# Patient Record
Sex: Male | Born: 1960
Health system: Southern US, Community
[De-identification: ages and names within clinical notes are randomized; demographics above are authoritative.]

## PROBLEM LIST (undated history)

## (undated) DIAGNOSIS — I639 Cerebral infarction, unspecified: Secondary | ICD-10-CM

## (undated) DIAGNOSIS — I7300 Raynaud's syndrome without gangrene: Secondary | ICD-10-CM

## (undated) DIAGNOSIS — R519 Headache, unspecified: Secondary | ICD-10-CM

## (undated) DIAGNOSIS — M109 Gout, unspecified: Secondary | ICD-10-CM

## (undated) DIAGNOSIS — M1990 Unspecified osteoarthritis, unspecified site: Secondary | ICD-10-CM

## (undated) DIAGNOSIS — K219 Gastro-esophageal reflux disease without esophagitis: Secondary | ICD-10-CM

## (undated) DIAGNOSIS — Z87442 Personal history of urinary calculi: Secondary | ICD-10-CM

## (undated) DIAGNOSIS — J449 Chronic obstructive pulmonary disease, unspecified: Secondary | ICD-10-CM

## (undated) DIAGNOSIS — R51 Headache: Secondary | ICD-10-CM

## (undated) HISTORY — DX: Raynaud's syndrome without gangrene: I73.00

## (undated) HISTORY — DX: Unspecified osteoarthritis, unspecified site: M19.90

---

## 2004-07-01 ENCOUNTER — Observation Stay (HOSPITAL_COMMUNITY): Admission: RE | Admit: 2004-07-01 | Discharge: 2004-07-02 | Payer: Self-pay | Admitting: General Surgery

## 2005-01-30 HISTORY — PX: HERNIA REPAIR: SHX51

## 2008-07-22 ENCOUNTER — Ambulatory Visit (HOSPITAL_COMMUNITY): Admission: RE | Admit: 2008-07-22 | Discharge: 2008-07-22 | Payer: Self-pay | Admitting: Family Medicine

## 2009-01-30 DIAGNOSIS — I639 Cerebral infarction, unspecified: Secondary | ICD-10-CM

## 2009-01-30 HISTORY — DX: Cerebral infarction, unspecified: I63.9

## 2010-06-17 NOTE — H&P (Signed)
Cory Roberts, Cory Roberts                  ACCOUNT NO.:  [id number]   MEDICAL RECORD NO.:  [id number]         PATIENT TYPE:  AMB   LOCATION:  DAY                           FACILITY:  [facility]   PHYSICIAN:  Jerolyn Shin C. Katrinka Blazing, M.D.   DATE OF BIRTH:  1960-11-15   DATE OF ADMISSION:  DATE OF DISCHARGE:  LH                                HISTORY & PHYSICAL   HISTORY OF PRESENT ILLNESS:  A 50 year old male with a long-term history of  swelling of the left inguinal area.  He was found to have a large  incarcerated left inguinal hernia and bilateral hydrocele.  He is scheduled  for left inguinal hernia repair and hydrocele on the left side.   PAST HISTORY:  He has no major illness.   MEDICATIONS:  He is not on any chronic medications.   ALLERGIES:  CODEINE.   PREVIOUS SURGERY:  He has no previous surgery.   PHYSICAL EXAMINATION:  VITAL SIGNS:  Blood pressure 126/72, pulse 76,  respirations 20, weight 169 pounds.  HEENT:  Unremarkable.  NECK:  Supple.  No JVD, bruit, adenopathy, or thyromegaly.  CHEST:  Clear to auscultation.  HEART:  Regular rate and rhythm without murmur, gallop, or rub.  ABDOMEN:  Soft, nontender.  No masses.  Large left inguinal hernia with  incarceration.  GENITALIA:  Bilateral hydrocele more prominent on the left than on the  right.  EXTREMITIES:  No clubbing, cyanosis, or edema.  NEUROLOGIC:  No focal motor, sensory, or cerebellar deficit.   IMPRESSION:  Left inguinal hernia repair with left hydrocele.   PLAN:  Left inguinal hernia repair and hydrocelectomy.       LCS/MEDQ  D:  06/30/2004  T:  06/30/2004  Job:  [id number]

## 2010-06-17 NOTE — Op Note (Signed)
[name], Cory Roberts                  ACCOUNT NO.:  [id number]   MEDICAL RECORD NO.:  [id number]          PATIENT TYPE:  OBV   LOCATION:  A215                          FACILITY:  [facility]   PHYSICIAN:  Dirk Dress. Katrinka Blazing, M.D.   DATE OF BIRTH:  1960-08-01   DATE OF PROCEDURE:  07/01/2004  DATE OF DISCHARGE:                                 OPERATIVE REPORT   PREOPERATIVE DIAGNOSIS:  Left inguinal hernia and hydrocele.   POSTOPERATIVE DIAGNOSIS:  Left inguinal hernia and hydrocele.   PROCEDURES:  Left inguinal hernia repair and hydrocelectomy.   SURGEON:  Dr. Katrinka Blazing.   DESCRIPTION:  Under general anesthesia, the patient's left lower quadrant  and genitalia were prepped and draped in sterile field. A curvilinear lower  inguinal incision was made and extended through the subcutaneous tissue.  Aponeurosis was opened in the line of its fibers. Cord was mobilized, and a  large hernia sac was identified. Hernia sac was dissected from the cord back  to the internal ring. It was invaginated. Two large mesh plugs were placed  and were sutured with interrupted 0 Prolene. An [place] patch was used. It was  used to do a primary repair because the defect was so large. It was sutured  to the conjoined tendon and to Cooper's ligament and iliopubic tract using 0  Prolene. The testicle was mobilized into the operative field. It had a very  large hydrocele. Hydrocele was incised, and fluid was suctioned. Excess sac  was excised, and then the edges were reapproximated posteriorly to the  testicle using 2-0 Monocryl. The testicle was repositioned back in the  scrotum at the gubernaculum. The aponeurosis was closed over the cord using  3-0 Biosyn. Subcutaneous tissue was closed with 3-0 Biosyn. Skin was closed  with subcuticular 4-0 Dexon. Local infiltration was carried out with 0.5%  Marcaine with epinephrine. The patient tolerated procedure well. He was  awakened from anesthesia and transferred to a bed and taken to  the  postanesthetic care unit for further monitoring.       LCS/MEDQ  D:  07/01/2004  T:  07/01/2004  Job:  [id number]   cc:   Madelin Rear. Sherwood Gambler, MD  [address]  [place]  [place] [address]  Fax: [phone]

## 2011-03-03 DIAGNOSIS — M1990 Unspecified osteoarthritis, unspecified site: Secondary | ICD-10-CM

## 2011-03-03 HISTORY — DX: Unspecified osteoarthritis, unspecified site: M19.90

## 2011-04-24 ENCOUNTER — Encounter: Payer: Self-pay | Admitting: Surgery

## 2011-05-08 ENCOUNTER — Encounter: Payer: Self-pay | Admitting: Surgery

## 2011-05-11 ENCOUNTER — Other Ambulatory Visit: Payer: Self-pay

## 2011-05-11 DIAGNOSIS — I7300 Raynaud's syndrome without gangrene: Secondary | ICD-10-CM

## 2011-05-12 ENCOUNTER — Encounter: Payer: Self-pay | Admitting: Surgery

## 2011-05-15 ENCOUNTER — Encounter (INDEPENDENT_AMBULATORY_CARE_PROVIDER_SITE_OTHER): Payer: BC Managed Care – PPO | Admitting: *Deleted

## 2011-05-15 ENCOUNTER — Other Ambulatory Visit: Payer: Self-pay | Admitting: Surgery

## 2011-05-15 ENCOUNTER — Ambulatory Visit (INDEPENDENT_AMBULATORY_CARE_PROVIDER_SITE_OTHER): Payer: BC Managed Care – PPO | Admitting: Thoracic Diseases

## 2011-05-15 ENCOUNTER — Encounter: Payer: Self-pay | Admitting: Surgery

## 2011-05-15 VITALS — BP 128/83 | HR 64 | Resp 16 | Ht 70.0 in | Wt 190.8 lb

## 2011-05-15 DIAGNOSIS — I749 Embolism and thrombosis of unspecified artery: Secondary | ICD-10-CM

## 2011-05-15 DIAGNOSIS — I779 Disorder of arteries and arterioles, unspecified: Secondary | ICD-10-CM

## 2011-05-15 DIAGNOSIS — M79609 Pain in unspecified limb: Secondary | ICD-10-CM

## 2011-05-15 MED ORDER — ASPIRIN 325 MG PO TABS: 325.0000 mg | ORAL_TABLET | Freq: Every day | ORAL | Status: DC

## 2011-05-15 NOTE — Progress Notes (Signed)
[facility] HISTORY AND PHYSICAL   CC: cyanosis and pain right 3rd and fifth fingers Referring Physician: Larrie Kass, PA PMD: Elfredia Nevins, MD  History of Present Illness: Cory Roberts is a 51 y.o. male - left handed- Who had new onset of gout in right great toe approx 3 months ago. He was started on Prednisone for this. One week into treatment the patient developed sudden onset of pain, numbness, coldness and cyanosis in the 3rd and 5th fingers of the right hand. The numbness and pain spread to the arm up to the shoulder and was described as sharp and constant throbbing. He was seen 3-4 weeks into treatment and the prednisone was stopped. He was placed on procardia with some resolution of symptoms. He also had swelling in the tips of the fingers to the extent that they blistered and healed. He denies symptoms in his left hand. He states the arm and hand will still go numb for about 1/2 the day and then the symptoms resolve.  Pt states he was worked d-up 4 years ago for possible TIA/Stroke when he developed "squeezing " pain in the back of the neck followed by "seeing spots' and then passing out. He had MRI at the time which he says was negative and did not go for further F/U.  He states he continues to have these symptoms 1-2 times per week but can feel this coming on so he sits and let's the symptoms pass. Pt. denies symptoms of amaurosis fugax; denies monocular blindness. Pt. has normal speech He denies weakness in LUE,LLE or RLE He  Denies irregular Heart beat or rapid heartbeat.  Past Surgical History  Procedure Date  . Hernia repair 2007    Past Medical History  Diagnosis Date  . Raynaud's syndrome   . Arthritis Feb. 2013    Gout- Right foot    ROS: [x]  Positive   [ ]  Denies    General: [ ]  Weight loss, [ ]  Fever, [ ]  chills Neurologic: [ x] Dizziness, [x ] Blackouts, [ ]  Seizure [ ]  Stroke, [ ]  "Mini stroke", [ ]  Slurred speech, [ ]   Temporary blindness; [x ] weakness in arms or legs, [ ]  Hoarseness Cardiac: [ ]  Chest pain/pressure, [ ]  Shortness of breath at rest [ ]  Shortness of breath with exertion, [ ]  Atrial fibrillation or irregular heartbeat Vascular: [ ]  Pain in legs with walking, [ ]  Pain in legs at rest, [ ]  Pain in legs at night,  [ ]  Non-healing ulcer, [ ]  Blood clot in vein/DVT,   Pulmonary: [ ]  Home oxygen, [ ]  Productive cough, [ ]  Coughing up blood, [ ]  Asthma,  [ ]  Wheezing Musculoskeletal:  [ ]  Arthritis, [ ]  Low back pain, [ ]  Joint pain Hematologic: [ ]  Easy Bruising, [ ]  Anemia; [ ]  Hepatitis Gastrointestinal: [ ]  Blood in stool, [ ]  Gastroesophageal Reflux/heartburn, [ ]  Trouble swallowing Urinary: [ ]  chronic Kidney disease, [ ]  on HD - [ ]  MWF or [ ]  TTHS, [ ]  Burning with urination, [ ]  Difficulty urinating Skin: [ ]  Rashes, [ ]  Wounds Psychological: [ ]  Anxiety, [ ]  Depression   Social History History  Substance Use Topics  . Smoking status: Former Smoker    Types: Cigarettes    Quit date: 01/30/2005  . Smokeless tobacco: Not on file  . Alcohol Use: No    Family History Family History  Problem Relation Age of Onset  . Cancer Mother   .  Hyperlipidemia Mother   . Hypertension Mother   . Cancer Father   . Heart disease Father 51    Heart Disease before age 23  . Heart attack Father   . Diabetes Sister   . Cancer Sister   . Hyperlipidemia Sister   . Hypertension Sister     Allergies  Allergen Reactions  . Codeine Nausea And Vomiting    Current Outpatient Prescriptions  Medication Sig Dispense Refill  . NIFEdipine (PROCARDIA) 10 MG capsule Take 10 mg by mouth 3 (three) times daily.      Marland Kitchen aspirin (BAYER ASPIRIN) 325 MG tablet Take 1 tablet (325 mg total) by mouth daily.  100 tablet  3    Physical Examination  Filed Vitals:   05/15/11 1302  BP: 128/83  Pulse: 64  Resp: 16    Body mass index is 27.38 kg/(m^2).  General:  WDWN in NAD Gait: Normal HENT: WNL Eyes:  Pupils equal Pulmonary: normal non-labored breathing , without Rales, rhonchi,  wheezing Cardiac: RRR, without  Murmurs, rubs or gallops; No carotid bruits noted Abdomen: soft, NT, no masses Skin: no rashes, ulcers noted Vascular Exam/Pulses:  2+ palpable Radial, femoral DP and PT pulses bilaterally 1+ ulnar bilaterally Extremities without ischemic changes, no Gangrene , no cellulitis; no open wounds;  Musculoskeletal: no muscle wasting or atrophy  Neurologic: A&O X 3; Appropriate Affect ; SENSATION: normal in BUE/BLE MOTOR FUNCTION:  moving all extremities equally with 5/5 strength. Speech is fluent/normal  Non-Invasive Vascular Imaging: 05/15/2011 [facility]BI Normal study with triphasic flow bilaterally except biphasic flow in the right ulnar artery Digit waveforms bilat- WNL  ASSESSMENT: Possible embolic event to right 3rd and 5th fingers, source unknown. Will need to R/O cardiac,arterial and aneurysmal  Source. R/O left brain stroke/ carotid disease - less likely source of symptoms PLAN: CTA of chest with right arm runoff to R/o obstructive or aneurysmal source of emboli 2D ECHO - R/O cardiac source of emboli Carotid duplex F/U next week with Dr. Myra Gianotti  Clinic MD: Vivi Martens, MD

## 2011-05-16 ENCOUNTER — Ambulatory Visit (HOSPITAL_COMMUNITY): Payer: BC Managed Care – PPO

## 2011-05-17 ENCOUNTER — Other Ambulatory Visit: Payer: BC Managed Care – PPO

## 2011-05-22 ENCOUNTER — Other Ambulatory Visit: Payer: BC Managed Care – PPO

## 2011-05-22 ENCOUNTER — Ambulatory Visit: Payer: BC Managed Care – PPO | Admitting: Surgery

## 2011-10-28 ENCOUNTER — Other Ambulatory Visit: Payer: Self-pay

## 2011-10-28 ENCOUNTER — Encounter (HOSPITAL_COMMUNITY): Payer: Self-pay | Admitting: *Deleted

## 2011-10-28 ENCOUNTER — Emergency Department (HOSPITAL_COMMUNITY): Payer: BC Managed Care – PPO

## 2011-10-28 ENCOUNTER — Inpatient Hospital Stay (HOSPITAL_COMMUNITY)
Admission: EM | Admit: 2011-10-28 | Discharge: 2011-10-31 | DRG: 014 | Disposition: A | Discharge status: Home / Self Care | Payer: BC Managed Care – PPO | Attending: Internal Medicine | Admitting: Internal Medicine

## 2011-10-28 DIAGNOSIS — G43909 Migraine, unspecified, not intractable, without status migrainosus: Secondary | ICD-10-CM | POA: Diagnosis present

## 2011-10-28 DIAGNOSIS — E669 Obesity, unspecified: Secondary | ICD-10-CM | POA: Diagnosis present

## 2011-10-28 DIAGNOSIS — M109 Gout, unspecified: Secondary | ICD-10-CM | POA: Diagnosis present

## 2011-10-28 DIAGNOSIS — Z6827 Body mass index (BMI) 27.0-27.9, adult: Secondary | ICD-10-CM

## 2011-10-28 DIAGNOSIS — R209 Unspecified disturbances of skin sensation: Secondary | ICD-10-CM | POA: Diagnosis present

## 2011-10-28 DIAGNOSIS — E785 Hyperlipidemia, unspecified: Secondary | ICD-10-CM

## 2011-10-28 DIAGNOSIS — M79609 Pain in unspecified limb: Secondary | ICD-10-CM

## 2011-10-28 DIAGNOSIS — I639 Cerebral infarction, unspecified: Secondary | ICD-10-CM

## 2011-10-28 DIAGNOSIS — Z8673 Personal history of transient ischemic attack (TIA), and cerebral infarction without residual deficits: Secondary | ICD-10-CM | POA: Diagnosis present

## 2011-10-28 DIAGNOSIS — R42 Dizziness and giddiness: Secondary | ICD-10-CM | POA: Diagnosis present

## 2011-10-28 DIAGNOSIS — I6350 Cerebral infarction due to unspecified occlusion or stenosis of unspecified cerebral artery: Principal | ICD-10-CM

## 2011-10-28 DIAGNOSIS — Z87891 Personal history of nicotine dependence: Secondary | ICD-10-CM

## 2011-10-28 DIAGNOSIS — Z9189 Other specified personal risk factors, not elsewhere classified: Secondary | ICD-10-CM

## 2011-10-28 DIAGNOSIS — M129 Arthropathy, unspecified: Secondary | ICD-10-CM | POA: Diagnosis present

## 2011-10-28 DIAGNOSIS — G43109 Migraine with aura, not intractable, without status migrainosus: Secondary | ICD-10-CM

## 2011-10-28 DIAGNOSIS — E7800 Pure hypercholesterolemia, unspecified: Secondary | ICD-10-CM | POA: Diagnosis present

## 2011-10-28 DIAGNOSIS — I7300 Raynaud's syndrome without gangrene: Secondary | ICD-10-CM | POA: Diagnosis present

## 2011-10-28 HISTORY — DX: Gout, unspecified: M10.9

## 2011-10-28 LAB — POCT I-STAT, CHEM 8
BUN: 16 mg/dL (ref 6–23)
Calcium, Ion: 1.16 mmol/L (ref 1.12–1.23)
Chloride: 105 mEq/L (ref 96–112)
Potassium: 3.5 mEq/L (ref 3.5–5.1)

## 2011-10-28 LAB — CBC WITH DIFFERENTIAL/PLATELET
Eosinophils Relative: 0 % (ref 0–5)
HCT: 41.5 % (ref 39.0–52.0)
Hemoglobin: 14.7 g/dL (ref 13.0–17.0)
Lymphocytes Relative: 37 % (ref 12–46)
Lymphs Abs: 2.5 10*3/uL (ref 0.7–4.0)
MCV: 92 fL (ref 78.0–100.0)
Monocytes Absolute: 0.5 10*3/uL (ref 0.1–1.0)
Monocytes Relative: 8 % (ref 3–12)
Neutro Abs: 3.8 10*3/uL (ref 1.7–7.7)
RBC: 4.51 MIL/uL (ref 4.22–5.81)
RDW: 12.7 % (ref 11.5–15.5)
WBC: 6.9 10*3/uL (ref 4.0–10.5)

## 2011-10-28 MED ORDER — MECLIZINE HCL 12.5 MG PO TABS
25.0000 mg | ORAL_TABLET | Freq: Once | ORAL | Status: AC
  Administered 2011-10-28: 25 mg via ORAL
  Filled 2011-10-28: qty 2

## 2011-10-28 NOTE — H&P (Addendum)
PCP:   Cassell Smiles., MD not local   Chief Complaint:   Vertigo and ataxia  HPI: Cory Roberts is a 51 y.o. male   has a past medical history of Raynaud's syndrome; Arthritis (Feb. 2013); and Gout.   Presented with  Since 10 am he started to feel like the room is spinning and he felt nausea. He presented to [facility] and was given a meclozine with some improvement. He was transferred for further work up to [facility] for further work up. An MRI done showed small brain stem infarct. Neurology is aware and have seen the patient. Currently his symptoms have resolved bu 8:30 pm. At the time he had trouble standing up due to ataxia.   Review of Systems:   Pertinent positives include:headaches, gait abnormality,   Constitutional:  No weight loss, night sweats, Fevers, chills, fatigue, weight loss  HEENT:  No , Difficulty swallowing,Tooth/dental problems,Sore throat,  No sneezing, itching, ear ache, nasal congestion, post nasal drip,  Cardio-vascular:  No chest pain, Orthopnea, PND, anasarca, dizziness, palpitations.no Bilateral lower extremity swelling  GI:  No heartburn, indigestion, abdominal pain, nausea, vomiting, diarrhea, change in bowel habits, loss of appetite, melena, blood in stool, hematemesis Resp:  no shortness of breath at rest. No dyspnea on exertion, No excess mucus, no productive cough, No non-productive cough, No coughing up of blood.No change in color of mucus.No wheezing. Skin:  no rash or lesions. No jaundice GU:  no dysuria, change in color of urine, no urgency or frequency. No straining to urinate.  No flank pain.  Musculoskeletal:  No joint pain or no joint swelling. No decreased range of motion. No back pain.  Psych:  No change in mood or affect. No depression or anxiety. No memory loss.  Neuro: no localizing neurological complaints, no tingling, no weakness, no double vision, nono slurred speech, no confusion  Otherwise ROS are negative except for above, 10  systems were reviewed  Past Medical History: Past Medical History  Diagnosis Date  . Raynaud's syndrome   . Arthritis Feb. 2013    Gout- Right foot  . Gout    Past Surgical History  Procedure Date  . Hernia repair 2007     Medications: Prior to Admission medications   Not on File    Allergies:   Allergies  Allergen Reactions  . Codeine Nausea And Vomiting  . Hydrocodone Nausea Only    Social History:  Ambulatory  independently  Lives at home with wife.   reports that he quit smoking about 6 years ago. His smoking use included Cigarettes. He does not have any smokeless tobacco history on file. He reports that he does not drink alcohol or use illicit drugs.   Family History: family history includes Cancer in his father, mother, and sister; Diabetes in his sister; Heart attack in his father; Heart disease (age of onset:48) in his father; Hyperlipidemia in his mother and sister; and Hypertension in his mother and sister.    Physical Exam: Patient Vitals for the past 24 hrs:  BP Temp Temp src Pulse Resp SpO2 Height Weight  10/28/11 1818 120/71 mmHg 97.9 F (36.6 C) Oral 61  19  98 % - -  10/28/11 1707 123/75 mmHg 97.7 F (36.5 C) - 52  17  100 % - -  10/28/11 1451 - 97.7 F (36.5 C) - - - - - -  10/28/11 1428 164/97 mmHg - - 78  - - - -  10/28/11 1417 133/78 mmHg - -  64  20  98 % - -  10/28/11 1416 136/83 mmHg 97.4 F (36.3 C) Oral 79  23  99 % 5\' 10"  (1.778 m) 86.183 kg (190 lb)    1. General:  in No Acute distress 2. Psychological: Alert and  Oriented 3. Head/ENT:   Moist  Mucous Membranes                          Head Non traumatic, neck supple                          Normal  Dentition 4. SKIN: normal Skin turgor,  Skin clean Dry and intact no rash 5. Heart: Regular rate and rhythm no Murmur, Rub or gallop 6. Lungs: Clear to auscultation bilaterally, no wheezes or crackles   7. Abdomen: Soft, non-tender, Non distended 8. Lower extremities: no clubbing,  cyanosis, or edema 9. Neurologically strength 5/5 in all 4 ext. CN 2-12 intact, he does have vertical nystagmus present 10. MSK: Normal range of motion  body mass index is 27.26 kg/(m^2).   Labs on Admission:   [facility] 10/28/11 1514  NA 143  K 3.5  CL 105  CO2 --  GLUCOSE 109*  BUN 16  CREATININE 1.00  CALCIUM --  MG --  PHOS --   No results found for this basename: AST:2,ALT:2,ALKPHOS:2,BILITOT:2,PROT:2,ALBUMIN:2 in the last 72 hours No results found for this basename: LIPASE:2,AMYLASE:2 in the last 72 hours  Basename 10/28/11 2009 10/28/11 1514  WBC 6.9 --  NEUTROABS 3.8 --  HGB 14.7 15.0  HCT 41.5 44.0  MCV 92.0 --  PLT 200 --   No results found for this basename: CKTOTAL:3,CKMB:3,CKMBINDEX:3,TROPONINI:3 in the last 72 hours No results found for this basename: TSH,T4TOTAL,FREET3,T3FREE,THYROIDAB in the last 72 hours No results found for this basename: VITAMINB12:2,FOLATE:2,FERRITIN:2,TIBC:2,IRON:2,RETICCTPCT:2 in the last 72 hours No results found for this basename: HGBA1C    Estimated Creatinine Clearance: 88.2 ml/min (by C-G formula based on Cr of 1). ABG    Component Value Date/Time   TCO2 23 10/28/2011 1514     Other results:  I have pearsonaly reviewed this: ECG REPORT  Rate: 54  Rhythm: NSR ST&T Change: no ischemic changes   Cultures: No results found for this basename: sdes, specrequest, cult, reptstatus       Radiological Exams on Admission: Ct Head Wo Contrast  10/28/2011  *RADIOLOGY REPORT*  Clinical Data: Dizziness, nausea and vomiting.  CT HEAD WITHOUT CONTRAST  Technique:  Contiguous axial images were obtained from the base of the skull through the vertex without contrast.  Comparison: Brain MRI 07/22/2008.  Findings: The brain appears normal with evidence of infarct, hemorrhage, mass lesion, mass effect, midline shift or abnormal extra-axial fluid collection.  No hydrocephalus or pneumocephalus. Calvarium intact.  IMPRESSION: Negative  exam.   Original Report Authenticated By: Bernadene Bell. Maricela Curet, M.D.    Mr Brain Wo Contrast  10/28/2011  *RADIOLOGY REPORT*  Clinical Data: Acute onset of vertigo earlier today.  Still slightly dizzy. History of Raynaud's syndrome.  History of gout.  MRI HEAD WITHOUT CONTRAST  Technique:  Multiplanar, multiecho pulse sequences of the brain and surrounding structures were obtained according to standard protocol without intravenous contrast.  Comparison: CT head earlier in the day.  Findings: There is a 3-4 mm area of restricted diffusion centrally within the medulla (image 9 series 10) without other brainstem or cerebellar areas of restricted diffusion.  This  is seen only on thin section DWI specifically through the posterior fossa (series 10); routine DWI series is inconclusive. Findings are consistent with an acute small vessel brainstem infarct.  Widely patent intracranial vasculature, specifically the vertebral and basilar arteries appear widely patent.  The left vertebral is dominant.  There is no evidence for dissection based on axial routine MR imaging.  There is no bleed, mass lesion, hydrocephalus, or extra-axial fluid.  There is no atrophy. There is no significant white matter disease.  Normal pituitary and cerebellar tonsils.  No foci of chronic hemorrhage or large vessel infarct. Negative pituitary, cerebellar tonsils, and orbits.  Unremarkable upper cervical region.  No worrisome skull base or osseous lesions.  No acute sinus or mastoid fluid. Incidental shotty cervical lymphadenopathy.  IMPRESSION: Small area of restricted diffusion centrally within the medulla concerning for acute brainstem infarct. No lobar hemorrhage or bleed observed.  Otherwise negative study. Findings discussed with ordering provider.   Original Report Authenticated By: Elsie Stain, M.D.     Chart has been reviewed  Assessment/Plan  51 yo M here with acute brainstem CVA currently symptomatically much improved.    Present on Admission:  .CVA (cerebral infarction)  - will admit based on TIA/CVA protocol, await results of Carotid Doppler and Echo, obtain cardiac enzymes,  ECG,  Lipid panel, TSH. Order PT/OT evaluation. Will make sure patient is on antiplatelet agent.  Neurology have been consulted.     Given hx of Reynolds syndrome will have basic rheumatologic work up. And make sure he has an MRA ordered as well.  .Vertigo - as a result of CVA currently much improved.    Prophylaxis: SCD   CODE STATUS: FULL CODE  Other plan as per orders.  I have spent a total of 55 min on this admission  Aydin Hink 10/28/2011, 9:50 PM

## 2011-10-28 NOTE — ED Provider Notes (Signed)
6:16 PM Patient has arrived to CDU holding for MRI brain.  Pt sent from Dr Rosalia Hammers at [facility].  Pt with vertiginous symptoms and ataxic gait, symptoms improved in ED.  Plan is for MRI brain.  If negative, pt to be d/c home.    7:38 PM Pt reports he is completely asymptomatic, back to baseline.  Denies any dizziness.  Pt has just returned from MRI.    7:54 PM Received call from radiologist.  Pt has had acute brain stem infarct.  I have advised Dr Bernette Mayers of the results.  I have also ordered a CBC and plan for admission.    8:04 PM I have spoken with Dr Thad Ranger of neurology and made her aware of patient with stroke.  Plan for admission to medicine. I have discussed the findings and plan with the patient - he verbalizes understanding and agrees with plan.    8:59 PM Pt admitted to [facility] hospitalist for acute stroke.  To be seen in CDU by hospitalist.  No bed request or holding orders needed from me.    Results for orders placed during the hospital encounter of 10/28/11  POCT I-STAT, CHEM 8      Component Value Range   Sodium 143  135 - 145 mEq/L   Potassium 3.5  3.5 - 5.1 mEq/L   Chloride 105  96 - 112 mEq/L   BUN 16  6 - 23 mg/dL   Creatinine, Ser 5.62  0.50 - 1.35 mg/dL   Glucose, Bld 130 (*) 70 - 99 mg/dL   Calcium, Ion 8.65  7.84 - 1.23 mmol/L   TCO2 23  0 - 100 mmol/L   Hemoglobin 15.0  13.0 - 17.0 g/dL   HCT 69.6  29.5 - 28.4 %  CBC WITH DIFFERENTIAL      Component Value Range   WBC 6.9  4.0 - 10.5 K/uL   RBC 4.51  4.22 - 5.81 MIL/uL   Hemoglobin 14.7  13.0 - 17.0 g/dL   HCT 13.2  44.0 - 10.2 %   MCV 92.0  78.0 - 100.0 fL   MCH 32.6  26.0 - 34.0 pg   MCHC 35.4  30.0 - 36.0 g/dL   RDW 72.5  36.6 - 44.0 %   Platelets 200  150 - 400 K/uL   Neutrophils Relative 55  43 - 77 %   Neutro Abs 3.8  1.7 - 7.7 K/uL   Lymphocytes Relative 37  12 - 46 %   Lymphs Abs 2.5  0.7 - 4.0 K/uL   Monocytes Relative 8  3 - 12 %   Monocytes Absolute 0.5  0.1 - 1.0 K/uL   Eosinophils Relative 0  0 -  5 %   Eosinophils Absolute 0.0  0.0 - 0.7 K/uL   Basophils Relative 0  0 - 1 %   Basophils Absolute 0.0  0.0 - 0.1 K/uL   Ct Head Wo Contrast  10/28/2011  *RADIOLOGY REPORT*  Clinical Data: Dizziness, nausea and vomiting.  CT HEAD WITHOUT CONTRAST  Technique:  Contiguous axial images were obtained from the base of the skull through the vertex without contrast.  Comparison: Brain MRI 07/22/2008.  Findings: The brain appears normal with evidence of infarct, hemorrhage, mass lesion, mass effect, midline shift or abnormal extra-axial fluid collection.  No hydrocephalus or pneumocephalus. Calvarium intact.  IMPRESSION: Negative exam.   Original Report Authenticated By: Bernadene Bell. Maricela Curet, M.D.    Mr Brain Wo Contrast  10/28/2011  *RADIOLOGY REPORT*  Clinical Data: Acute onset of vertigo earlier today.  Still slightly dizzy. History of Raynaud's syndrome.  History of gout.  MRI HEAD WITHOUT CONTRAST  Technique:  Multiplanar, multiecho pulse sequences of the brain and surrounding structures were obtained according to standard protocol without intravenous contrast.  Comparison: CT head earlier in the day.  Findings: There is a 3-4 mm area of restricted diffusion centrally within the medulla (image 9 series 10) without other brainstem or cerebellar areas of restricted diffusion.  This is seen only on thin section DWI specifically through the posterior fossa (series 10); routine DWI series is inconclusive. Findings are consistent with an acute small vessel brainstem infarct.  Widely patent intracranial vasculature, specifically the vertebral and basilar arteries appear widely patent.  The left vertebral is dominant.  There is no evidence for dissection based on axial routine MR imaging.  There is no bleed, mass lesion, hydrocephalus, or extra-axial fluid.  There is no atrophy. There is no significant white matter disease.  Normal pituitary and cerebellar tonsils.  No foci of chronic hemorrhage or large vessel  infarct. Negative pituitary, cerebellar tonsils, and orbits.  Unremarkable upper cervical region.  No worrisome skull base or osseous lesions.  No acute sinus or mastoid fluid. Incidental shotty cervical lymphadenopathy.  IMPRESSION: Small area of restricted diffusion centrally within the medulla concerning for acute brainstem infarct. No lobar hemorrhage or bleed observed.  Otherwise negative study. Findings discussed with ordering provider.   Original Report Authenticated By: Elsie Stain, M.D.           [place], [place] 10/28/11 2102

## 2011-10-28 NOTE — ED Provider Notes (Signed)
Medical screening examination/treatment/procedure(s) were performed by non-physician practitioner and as supervising physician I was immediately available for consultation/collaboration.  Cayleen Benjamin B. Lacole Komorowski, MD 10/28/11 2256 

## 2011-10-28 NOTE — ED Notes (Signed)
Pt taken to CT.

## 2011-10-28 NOTE — ED Notes (Signed)
Pt. Arrived via [facility], pt. Immediately transferred to MRI

## 2011-10-28 NOTE — ED Notes (Signed)
Patient had sudden onset of dizziness, nausea and vomiting this morning at 1000.

## 2011-10-28 NOTE — Consult Note (Signed)
Referring Physician: Ray    Chief Complaint: Dizziness and difficulty with gait  HPI: Cory Roberts is an 51 y.o. male who was sitting on the couch today and had acute onset of dizziness.  Also had some nausea and vomiting.  When he attempted to get up he was unable to walk.  [facility] presented to [facility] and CT there was unremarkable.  Patient had marked improvement in dizziness with Meclizine and was sent to [facility] for further work up.  MRI of the brain performed here shows an acute medullary infarct.    LSN: [id number] tPA Given: No: Outside time window  Past Medical History  Diagnosis Date  . Raynaud's syndrome   . Arthritis Feb. 2013    Gout- Right foot  . Gout     -  Hypertension  Past Surgical History  Procedure Date  . Hernia repair 2007    Family History  Problem Relation Age of Onset  . Cancer Mother   . Hyperlipidemia Mother   . Hypertension Mother   . Cancer Father   . Heart disease Father 74    Heart Disease before age 61  . Heart attack Father   . Diabetes Sister   . Cancer Sister   . Hyperlipidemia Sister   . Hypertension Sister    Social History:  reports that he quit smoking about 6 years ago. His smoking use included Cigarettes. He does not have any smokeless tobacco history on file. He reports that he does not drink alcohol or use illicit drugs.  Allergies:  Allergies  Allergen Reactions  . Codeine Nausea And Vomiting  . Hydrocodone Nausea Only    Medications: I have reviewed the patient's current medications. Prior to Admission:  None  ROS: History obtained from the patient  General ROS: negative for - chills, fatigue, fever, night sweats, weight gain or weight loss Psychological ROS: negative for - behavioral disorder, hallucinations, memory difficulties, mood swings or suicidal ideation Ophthalmic ROS: negative for - blurry vision, double vision, eye pain or loss of vision ENT ROS: negative for - epistaxis, nasal discharge, oral lesions, sore throat,  tinnitus or vertigo Allergy and Immunology ROS: negative for - hives or itchy/watery eyes Hematological and Lymphatic ROS: negative for - bleeding problems, bruising or swollen lymph nodes Endocrine ROS: negative for - galactorrhea, hair pattern changes, polydipsia/polyuria or temperature intolerance Respiratory ROS: negative for - cough, hemoptysis, shortness of breath or wheezing Cardiovascular ROS: negative for - chest pain, dyspnea on exertion, edema or irregular heartbeat Gastrointestinal ROS: negative for - abdominal pain, diarrhea, hematemesis, nausea/vomiting or stool incontinence Genito-Urinary ROS: negative for - dysuria, hematuria, incontinence or urinary frequency/urgency Musculoskeletal ROS: fingers become blue and numb on the tips Neurological ROS: as noted in HPI Dermatological ROS: negative for rash and skin lesion changes  Physical Examination: Blood pressure 120/71, pulse 61, temperature 97.9 F (36.6 C), temperature source Oral, resp. rate 19, height 5\' 10"  (1.778 m), weight 86.183 kg (190 lb), SpO2 98.00%.  Neurologic Examination: Mental Status: Alert, oriented, thought content appropriate.  Speech fluent without evidence of aphasia.  Able to follow 3 step commands without difficulty. Cranial Nerves: II: Discs flat bilaterally; Visual fields grossly normal, pupils equal, round, reactive to light and accommodation III,IV, VI: ptosis not present, extra-ocular motions intact bilaterally V,VII: smile symmetric, facial light touch sensation normal bilaterally VIII: hearing normal bilaterally IX,X: gag reflex present XI: bilateral shoulder shrug XII: midline tongue extension Motor: Right : Upper extremity   5/5    Left:  Upper extremity   5/5  Lower extremity   5/5     Lower extremity   5/5 Tone and bulk:normal tone throughout; no atrophy noted Sensory: Pinprick and light touch intact throughout, bilaterally Deep Tendon Reflexes: 2+ and symmetric with absent AJ's  bilaterally Plantars: Right: mute   Left: mute Cerebellar: normal finger-to-nose and normal heel-to-shin test.  In seated position leans to the right. Gait: not tested CV: pulses palpable throughout   Laboratory Studies:  Basic Metabolic Panel:  Lab 10/28/11 4098  NA 143  K 3.5  CL 105  CO2 --  GLUCOSE 109*  BUN 16  CREATININE 1.00  CALCIUM --  MG --  PHOS --    Liver Function Tests: No results found for this basename: AST:5,ALT:5,ALKPHOS:5,BILITOT:5,PROT:5,ALBUMIN:5 in the last 168 hours No results found for this basename: LIPASE:5,AMYLASE:5 in the last 168 hours No results found for this basename: AMMONIA:3 in the last 168 hours  CBC:  Lab 10/28/11 2009 10/28/11 1514  WBC 6.9 --  NEUTROABS 3.8 --  HGB 14.7 15.0  HCT 41.5 44.0  MCV 92.0 --  PLT 200 --    Cardiac Enzymes: No results found for this basename: CKTOTAL:5,CKMB:5,CKMBINDEX:5,TROPONINI:5 in the last 168 hours  BNP: No components found with this basename: POCBNP:5  CBG: No results found for this basename: GLUCAP:5 in the last 168 hours  Microbiology: No results found for this or any previous visit.  Coagulation Studies: No results found for this basename: LABPROT:5,INR:5 in the last 72 hours  Urinalysis: No results found for this basename: COLORURINE:2,APPERANCEUR:2,LABSPEC:2,PHURINE:2,GLUCOSEU:2,HGBUR:2,BILIRUBINUR:2,KETONESUR:2,PROTEINUR:2,UROBILINOGEN:2,NITRITE:2,LEUKOCYTESUR:2 in the last 168 hours  Lipid Panel: No results found for this basename: chol, trig, hdl, cholhdl, vldl, ldlcalc    HgbA1C:  No results found for this basename: HGBA1C    Urine Drug Screen:   No results found for this basename: labopia, cocainscrnur, labbenz, amphetmu, thcu, labbarb    Alcohol Level: No results found for this basename: ETH:2 in the last 168 hours.  Imaging: Ct Head Wo Contrast  10/28/2011  *RADIOLOGY REPORT*  Clinical Data: Dizziness, nausea and vomiting.  CT HEAD WITHOUT CONTRAST  Technique:   Contiguous axial images were obtained from the base of the skull through the vertex without contrast.  Comparison: Brain MRI 07/22/2008.  Findings: The brain appears normal with evidence of infarct, hemorrhage, mass lesion, mass effect, midline shift or abnormal extra-axial fluid collection.  No hydrocephalus or pneumocephalus. Calvarium intact.  IMPRESSION: Negative exam.   Original Report Authenticated By: Bernadene Bell. Maricela Curet, M.D.    Mr Brain Wo Contrast  10/28/2011  *RADIOLOGY REPORT*  Clinical Data: Acute onset of vertigo earlier today.  Still slightly dizzy. History of Raynaud's syndrome.  History of gout.  MRI HEAD WITHOUT CONTRAST  Technique:  Multiplanar, multiecho pulse sequences of the brain and surrounding structures were obtained according to standard protocol without intravenous contrast.  Comparison: CT head earlier in the day.  Findings: There is a 3-4 mm area of restricted diffusion centrally within the medulla (image 9 series 10) without other brainstem or cerebellar areas of restricted diffusion.  This is seen only on thin section DWI specifically through the posterior fossa (series 10); routine DWI series is inconclusive. Findings are consistent with an acute small vessel brainstem infarct.  Widely patent intracranial vasculature, specifically the vertebral and basilar arteries appear widely patent.  The left vertebral is dominant.  There is no evidence for dissection based on axial routine MR imaging.  There is no bleed, mass lesion, hydrocephalus, or extra-axial fluid.  There  is no atrophy. There is no significant white matter disease.  Normal pituitary and cerebellar tonsils.  No foci of chronic hemorrhage or large vessel infarct. Negative pituitary, cerebellar tonsils, and orbits.  Unremarkable upper cervical region.  No worrisome skull base or osseous lesions.  No acute sinus or mastoid fluid. Incidental shotty cervical lymphadenopathy.  IMPRESSION: Small area of restricted diffusion  centrally within the medulla concerning for acute brainstem infarct. No lobar hemorrhage or bleed observed.  Otherwise negative study. Findings discussed with ordering provider.   Original Report Authenticated By: Elsie Stain, M.D.     Assessment: 51 y.o. male with an acute medullary infarct and symptoms consisting of dizziness and gait ataxia.  Meclizine is helping the sensation of dizziness but gait remains impaired.  Stroke Risk Factors - hypertension  Plan: 1. HgbA1c, fasting lipid panel 2. MRI, MRA  of the brain without contrast 3. PT consult, OT consult 4. Echocardiogram 5. Carotid dopplers 6. Prophylactic therapy-Antiplatelet med: Aspirin - dose 325mg  daily 7. Risk factor modification 8. Telemetry monitoring 9. Frequent neuro checks   Thana Farr, MD [facility] [phone] 10/28/2011, 10:43 PM

## 2011-10-28 NOTE — ED Provider Notes (Signed)
History     CSN: [id number]  Arrival date & time 10/28/11  1424   First MD Initiated Contact with Patient 10/28/11 1431      Chief Complaint  Patient presents with  . Dizziness  . Emesis    (Consider location/radiation/quality/duration/timing/severity/associated sxs/prior treatment) HPI Patient sitting on couch at 1000 am when became vertiginous with nausea and vomiting.  Patient has had some similar symptoms in past.  No headache, no focal neurological deficits, patient unable to ambulate secondary to dizziness.   Past Medical History  Diagnosis Date  . Raynaud's syndrome   . Arthritis Feb. 2013    Gout- Right foot  . Gout     Past Surgical History  Procedure Date  . Hernia repair 2007    Family History  Problem Relation Age of Onset  . Cancer Mother   . Hyperlipidemia Mother   . Hypertension Mother   . Cancer Father   . Heart disease Father 72    Heart Disease before age 20  . Heart attack Father   . Diabetes Sister   . Cancer Sister   . Hyperlipidemia Sister   . Hypertension Sister     History  Substance Use Topics  . Smoking status: Former Smoker    Types: Cigarettes    Quit date: 01/30/2005  . Smokeless tobacco: Not on file  . Alcohol Use: No      Review of Systems  Constitutional: Negative for fever and chills.  HENT: Negative for neck stiffness.   Eyes: Negative for visual disturbance.  Respiratory: Negative for shortness of breath.   Cardiovascular: Negative for chest pain.  Gastrointestinal: Positive for vomiting. Negative for diarrhea and blood in stool.  Genitourinary: Negative for dysuria, frequency and decreased urine volume.  Musculoskeletal: Negative for myalgias and joint swelling.  Skin: Negative for rash.  Neurological: Negative for weakness.  Hematological: Negative for adenopathy.  Psychiatric/Behavioral: Negative for agitation.    Allergies  Codeine and Hydrocodone  Home Medications   Current Outpatient Rx  Name Route  Sig Dispense Refill  . ASPIRIN 325 MG PO TABS Oral Take 1 tablet (325 mg total) by mouth daily. 100 tablet 3  . NIFEDIPINE 10 MG PO CAPS Oral Take 10 mg by mouth 3 (three) times daily.      BP 164/97  Pulse 78  Temp 97.4 F (36.3 C) (Oral)  Resp 20  Ht 5\' 10"  (1.778 m)  Wt 190 lb (86.183 kg)  BMI 27.26 kg/m2  SpO2 98%  Physical Exam  Nursing note and vitals reviewed. Constitutional: He is oriented to person, place, and time. He appears well-developed and well-nourished.  HENT:  Head: Normocephalic and atraumatic.  Right Ear: External ear normal.  Left Ear: External ear normal.  Nose: Nose normal.  Mouth/Throat: Oropharynx is clear and moist.  Eyes: Conjunctivae normal and EOM are normal. Pupils are equal, round, and reactive to light.  Neck: Normal range of motion. Neck supple.  Cardiovascular: Normal rate, regular rhythm, normal heart sounds and intact distal pulses.   Pulmonary/Chest: Effort normal and breath sounds normal. No respiratory distress. He has no wheezes. He exhibits no tenderness.  Abdominal: Soft. Bowel sounds are normal. He exhibits no distension and no mass. There is no tenderness. There is no guarding.  Musculoskeletal: Normal range of motion.  Neurological: He is alert and oriented to person, place, and time. He has normal strength and normal reflexes. No sensory deficit. He exhibits normal muscle tone. He displays a negative Romberg sign. Coordination  normal. GCS eye subscore is 4. GCS verbal subscore is 5. GCS motor subscore is 6.  Skin: Skin is warm and dry.  Psychiatric: He has a normal mood and affect. His behavior is normal. Judgment and thought content normal.    ED Course  Procedures (including critical care time)  Labs Reviewed - No data to display No results found.   No diagnosis found.    MDM  Patient feels improved.  Patient ambulated with ataxic gait.  Remainder of neurologic exam normal.  Plan transfer for mri.   Discussed with Dr.  Benard Rink, Bernette Mayers and C[place], PA-C.  Patient to be transferred to [facility] at [facility] for MRI.   I personally performed the services described in this documentation, which was scribed in my presence. The recorded information has been reviewed and considered.     Hilario Quarry, MD 10/28/11 [phone]

## 2011-10-29 ENCOUNTER — Inpatient Hospital Stay (HOSPITAL_COMMUNITY): Payer: BC Managed Care – PPO

## 2011-10-29 ENCOUNTER — Encounter (HOSPITAL_COMMUNITY): Payer: Self-pay

## 2011-10-29 DIAGNOSIS — G43109 Migraine with aura, not intractable, without status migrainosus: Secondary | ICD-10-CM

## 2011-10-29 DIAGNOSIS — M79609 Pain in unspecified limb: Secondary | ICD-10-CM

## 2011-10-29 LAB — LIPID PANEL
Cholesterol: 123 mg/dL (ref 0–200)
HDL: 28 mg/dL — ABNORMAL LOW (ref >39)
Triglycerides: 63 mg/dL (ref <150)
VLDL: 13 mg/dL (ref 0–40)

## 2011-10-29 LAB — SEDIMENTATION RATE: Sed Rate: 1 mm/hr (ref 0–16)

## 2011-10-29 MED ORDER — SODIUM CHLORIDE 0.9 % IV SOLN
INTRAVENOUS | Status: DC
  Administered 2011-10-29: 01:00:00 via INTRAVENOUS

## 2011-10-29 MED ORDER — ACETAMINOPHEN 325 MG PO TABS: 650.0000 mg | ORAL_TABLET | ORAL | Status: DC | PRN

## 2011-10-29 MED ORDER — ASPIRIN 300 MG RE SUPP
300.0000 mg | Freq: Every day | RECTAL | Status: DC
  Filled 2011-10-29 (×3): qty 1

## 2011-10-29 MED ORDER — ONDANSETRON HCL 4 MG/2ML IJ SOLN: 4.0000 mg | Freq: Four times a day (QID) | INTRAMUSCULAR | Status: DC | PRN

## 2011-10-29 MED ORDER — ASPIRIN 325 MG PO TABS
325.0000 mg | ORAL_TABLET | Freq: Every day | ORAL | Status: DC
  Administered 2011-10-29 – 2011-10-31 (×3): 325 mg via ORAL
  Filled 2011-10-29 (×3): qty 1

## 2011-10-29 MED ORDER — SENNOSIDES-DOCUSATE SODIUM 8.6-50 MG PO TABS: 1.0000 | ORAL_TABLET | Freq: Every evening | ORAL | Status: DC | PRN

## 2011-10-29 MED ORDER — SIMVASTATIN 20 MG PO TABS
20.0000 mg | ORAL_TABLET | Freq: Every day | ORAL | Status: DC
  Administered 2011-10-29 – 2011-10-30 (×2): 20 mg via ORAL
  Filled 2011-10-29 (×3): qty 1

## 2011-10-29 MED ORDER — ACETAMINOPHEN 650 MG RE SUPP: 650.0000 mg | RECTAL | Status: DC | PRN

## 2011-10-29 MED ORDER — NAPROXEN 375 MG PO TABS
375.0000 mg | ORAL_TABLET | Freq: Two times a day (BID) | ORAL | Status: DC
  Administered 2011-10-29 – 2011-10-30 (×2): 375 mg via ORAL
  Filled 2011-10-29 (×4): qty 1

## 2011-10-29 NOTE — Progress Notes (Signed)
  Echocardiogram 2D Echocardiogram has been performed.  Cory Roberts 10/29/2011, 2:49 PM

## 2011-10-29 NOTE — Progress Notes (Signed)
[facility] PROGRESS NOTE  Cory Roberts ZOX:[id number] DOB: 07-26-1960 DOA: 10/28/2011 PCP: Cassell Smiles., MD  Assessment/Plan: Principal Problem:  *CVA (cerebral infarction) Active Problems:  Vertigo  Medullary infarct with residual off-balance sensation.  -  Appreciate neurology assistance -  Continue ASA 325mg  daily -  MRA pending -  ECHO pending -  Continue telemetry -  F/u ANA and ENA -  Carotid duplex negative -  PT recommending outpatient PT -  Blood pressure appears well controlled.  Will monitor -  LDL at goal, but HDL very low, start simvastatin -  A1c pending  Migraine headaches with overuse of ibuprofen.  Patient may be having some medication withdrawal headache. -  Naprosyn BID for now and wean as tolerated -  If truly migrainous, may need prophylactic medication (consider BB if needing to start antihypertensive)  Dyslipidemia, starting simvastatin  DIET:  Healthy heart ACCESS:  PIV IVF:  OFF PROPH:  SCDs  Code Status: Full code Family Communication: Spoke with patient and wife and extended family regarding plan of care Disposition Plan: Pending completion of stroke evaluation.     Brief narrative:  Cory Roberts is a 51 y.o. male  has a past medical history of Raynaud's syndrome; Arthritis (Feb. 2013); and Gout.  Presented with  Since 10 am he started to feel like the room is spinning and he felt nausea. He presented to [facility] and was given a meclozine with some improvement. He was transferred for further work up to [facility] for further work up. An MRI done showed small brain stem infarct. Neurology is aware and have seen the patient. Currently his symptoms have resolved bu 8:30 pm. At the time he had trouble standing up due to ataxia.    Consultants:  Neurology  Procedures:  CT head 9/28  MRI brain 9/28  Carotid duplex 9/29  ECHO 9/29  MRA brain pending  Antibiotics:  None  HPI/Subjective: Patient states that his dizziness is  much better.  He can move his head and neck and sit in chair without dizziness, but he feels off-balance when walking.  Denies new focal facial droop, numbness, tingling, weakness, slurred speech, difficulty swallowing.  Otherwise well.    Objective: Filed Vitals:   10/29/11 0417 10/29/11 0604 10/29/11 0808 10/29/11 1004  BP: 120/69 124/70 126/55 122/62  Pulse: 60 55 67 61  Temp: 98 F (36.7 C) 97.9 F (36.6 C) 97.6 F (36.4 C) 97.5 F (36.4 C)  TempSrc: Oral Oral Oral Oral  Resp: 16 16 19 19   Height:      Weight:      SpO2: 98% 98% 97% 99%    Intake/Output Summary (Last 24 hours) at 10/29/11 1418 Last data filed at 10/29/11 0600  Gross per 24 hour  Intake 351.25 ml  Output    200 ml  Net 151.25 ml   Filed Weights   10/28/11 1416  Weight: 86.183 kg (190 lb)    Exam:   General:  Obese caucasian male, no acute distress  HEENT:  MMM, anicteric, noninjected  Cardiovascular: RRR, no m/r/g, 2+ pulses  Respiratory: CTA  Abdomen: NABS, soft, obese, mildly tender with deep palpation right upper and lower quadrants without rebound or guarding  EXT:  No edema  Psych:  A&Ox4  Neuro:  II-XII grossly intact except visual fields and acuity not tested.  Strength 5/5, sensation intact.    Data Reviewed: Basic Metabolic Panel:  Lab 10/28/11 8119  NA 143  K 3.5  CL 105  CO2 --  GLUCOSE 109*  BUN 16  CREATININE 1.00  CALCIUM --  MG --  PHOS --   Liver Function Tests: No results found for this basename: AST:5,ALT:5,ALKPHOS:5,BILITOT:5,PROT:5,ALBUMIN:5 in the last 168 hours No results found for this basename: LIPASE:5,AMYLASE:5 in the last 168 hours No results found for this basename: AMMONIA:5 in the last 168 hours CBC:  Lab 10/28/11 2009 10/28/11 1514  WBC 6.9 --  NEUTROABS 3.8 --  HGB 14.7 15.0  HCT 41.5 44.0  MCV 92.0 --  PLT 200 --   Cardiac Enzymes: No results found for this basename: CKTOTAL:5,CKMB:5,CKMBINDEX:5,TROPONINI:5 in the last 168 hours BNP  (last 3 results) No results found for this basename: PROBNP:3 in the last 8760 hours CBG: No results found for this basename: GLUCAP:5 in the last 168 hours  No results found for this or any previous visit (from the past 240 hour(s)).   Studies: Dg Chest 2 View  10/29/2011  *RADIOLOGY REPORT*  Clinical Data: "Stroke."  Arthritis.  Gout.  CHEST - 2 VIEW  Comparison: None.  Findings: Lateral view degraded by patient arm position.  Midline trachea.  Normal heart size and mediastinal contours. No pleural effusion or pneumothorax.  Clear lungs.  IMPRESSION: No acute cardiopulmonary disease.   Original Report Authenticated By: Consuello Bossier, M.D.    Ct Head Wo Contrast  10/28/2011  *RADIOLOGY REPORT*  Clinical Data: Dizziness, nausea and vomiting.  CT HEAD WITHOUT CONTRAST  Technique:  Contiguous axial images were obtained from the base of the skull through the vertex without contrast.  Comparison: Brain MRI 07/22/2008.  Findings: The brain appears normal with evidence of infarct, hemorrhage, mass lesion, mass effect, midline shift or abnormal extra-axial fluid collection.  No hydrocephalus or pneumocephalus. Calvarium intact.  IMPRESSION: Negative exam.   Original Report Authenticated By: Bernadene Bell. Maricela Curet, M.D.    Mr Brain Wo Contrast  10/28/2011  *RADIOLOGY REPORT*  Clinical Data: Acute onset of vertigo earlier today.  Still slightly dizzy. History of Raynaud's syndrome.  History of gout.  MRI HEAD WITHOUT CONTRAST  Technique:  Multiplanar, multiecho pulse sequences of the brain and surrounding structures were obtained according to standard protocol without intravenous contrast.  Comparison: CT head earlier in the day.  Findings: There is a 3-4 mm area of restricted diffusion centrally within the medulla (image 9 series 10) without other brainstem or cerebellar areas of restricted diffusion.  This is seen only on thin section DWI specifically through the posterior fossa (series 10); routine DWI series  is inconclusive. Findings are consistent with an acute small vessel brainstem infarct.  Widely patent intracranial vasculature, specifically the vertebral and basilar arteries appear widely patent.  The left vertebral is dominant.  There is no evidence for dissection based on axial routine MR imaging.  There is no bleed, mass lesion, hydrocephalus, or extra-axial fluid.  There is no atrophy. There is no significant white matter disease.  Normal pituitary and cerebellar tonsils.  No foci of chronic hemorrhage or large vessel infarct. Negative pituitary, cerebellar tonsils, and orbits.  Unremarkable upper cervical region.  No worrisome skull base or osseous lesions.  No acute sinus or mastoid fluid. Incidental shotty cervical lymphadenopathy.  IMPRESSION: Small area of restricted diffusion centrally within the medulla concerning for acute brainstem infarct. No lobar hemorrhage or bleed observed.  Otherwise negative study. Findings discussed with ordering provider.   Original Report Authenticated By: Elsie Stain, M.D.     Scheduled Meds:   . aspirin  300 mg Rectal  Daily   Or  . aspirin  325 mg Oral Daily  . meclizine  25 mg Oral Once  . simvastatin  20 mg Oral q1800   Continuous Infusions:   . DISCONTD: sodium chloride 75 mL/hr at 10/29/11 0600    Principal Problem:  *CVA (cerebral infarction) Active Problems:  Vertigo    Time spent: 30    Darcia Lampi, [facility]  [facility] Pager [phone]. If 8PM-8AM, please contact night-coverage at [email or web address], password [facility] 10/29/2011, 2:18 PM  LOS: 1 day

## 2011-10-29 NOTE — Evaluation (Signed)
Physical Therapy Evaluation Patient Details Name: Cory Roberts MRN: [id number] DOB: October 05, 1960 Today's Date: 10/29/2011 Time: 1324-4010 PT Time Calculation (min): 17 min  PT Assessment / Plan / Recommendation Clinical Impression  Pt presenting with balance/equilibrium deficits from brain stem infarct.  Acute PT to follow to maximize functional mobility for safe d/c home.    PT Assessment  Patient needs continued PT services    Follow Up Recommendations  Outpatient PT;Supervision - Intermittent    Barriers to Discharge None      Equipment Recommendations  None recommended by PT    Recommendations for Other Services OT consult   Frequency Min 4X/week    Precautions / Restrictions Precautions Precautions: Fall   Pertinent Vitals/Pain 0/10      Mobility  Bed Mobility Bed Mobility: Supine to Sit;Sit to Supine Supine to Sit: 6: Modified independent (Device/Increase time) Sit to Supine: 6: Modified independent (Device/Increase time) Transfers Transfers: Sit to Stand;Stand to Sit;Stand Pivot Transfers Sit to Stand: 4: Min guard;From bed;With upper extremity assist Stand to Sit: 4: Min guard;To chair/3-in-1;To bed;With upper extremity assist Stand Pivot Transfers: 4: Min guard Details for Transfer Assistance: verbal cues for hand placement, safety Ambulation/Gait Ambulation/Gait Assistance: 4: Min guard Ambulation Distance (Feet): 50 Feet Assistive device: None Ambulation/Gait Assistance Details: assist required for safety due to vertigo Gait Pattern: Wide base of support;Lateral trunk lean to right Gait velocity: [facility]    Shoulder Instructions     Exercises     PT Diagnosis: Abnormality of gait  PT Problem List: Decreased balance;Decreased mobility;Decreased safety awareness PT Treatment Interventions: Gait training;Stair training;Functional mobility training;Therapeutic activities;Patient/family education;Neuromuscular re-education;Balance training   PT Goals Acute  Rehab PT Goals PT Goal Formulation: With patient Time For Goal Achievement: 11/05/11 Potential to Achieve Goals: Good Pt will go Sit to Stand: with modified independence;with upper extremity assist PT Goal: Sit to Stand - Progress: Goal set today Pt will go Stand to Sit: with modified independence;with upper extremity assist PT Goal: Stand to Sit - Progress: Goal set today Pt will Transfer Bed to Chair/Chair to Bed: with modified independence PT Transfer Goal: Bed to Chair/Chair to Bed - Progress: Goal set today Pt will Ambulate: >150 feet;with supervision PT Goal: Ambulate - Progress: Goal set today Pt will Go Up / Down Stairs: 3-5 stairs;with supervision PT Goal: Up/Down Stairs - Progress: Goal set today  Visit Information  Last PT Received On: 10/29/11 Assistance Needed: +1    Subjective Data  Subjective: "I am much better." Patient Stated Goal: return to work   Prior Functioning  Home Living Lives With: Spouse Available Help at Discharge: Family;Available PRN/intermittently Type of Home: Mobile home Home Access: Stairs to enter Entrance Stairs-Number of Steps: 3 Entrance Stairs-Rails: None Home Layout: One level Firefighter: Standard Prior Function Level of Independence: Independent Able to Take Stairs?: Yes Driving: Yes Vocation: Full time employment Communication Communication: No difficulties    Cognition  Overall Cognitive Status: Appears within functional limits for tasks assessed/performed Arousal/Alertness: Awake/alert Orientation Level: Oriented X4 / Intact Behavior During Session: [facility] for tasks performed    Extremity/Trunk Assessment Right Upper Extremity Assessment RUE ROM/Strength/Tone: Within functional levels Left Upper Extremity Assessment LUE ROM/Strength/Tone: Within functional levels Right Lower Extremity Assessment RLE ROM/Strength/Tone: Within functional levels Left Lower Extremity Assessment LLE ROM/Strength/Tone: Within functional  levels Trunk Assessment Trunk Assessment: Other exceptions Trunk Exceptions: right lateral lean, difficulty finding midline   Balance Balance Balance Assessed: Yes Static Sitting Balance Static Sitting - Balance Support: No upper extremity  supported;Feet supported Static Sitting - Level of Assistance: 6: Modified independent (Device/Increase time) Dynamic Sitting Balance Dynamic Sitting - Balance Support: No upper extremity supported;Feet supported Dynamic Sitting - Level of Assistance: 6: Modified independent (Device/Increase time) Static Standing Balance Static Standing - Balance Support: No upper extremity supported Static Standing - Level of Assistance: 5: Stand by assistance Dynamic Standing Balance Dynamic Standing - Balance Support: No upper extremity supported Dynamic Standing - Level of Assistance: 4: Min assist  End of Session PT - End of Session Equipment Utilized During Treatment: Gait belt Activity Tolerance: Patient tolerated treatment well Patient left: in chair;with call bell/phone within reach;with family/visitor present  GP     Ilda Foil 10/29/2011, 10:38 AM

## 2011-10-29 NOTE — Progress Notes (Signed)
Stroke team progress note  History: 51 year old male with raynaud's syndrome, arthritis, prior smoker, here with new onset dizziness. MRI shows subtle medulla, small vessel acute stroke on thin cut DWI views.  Subjective: No new events. Still feels like he is being pulled to right side when walking. No nausea, vertigo, slurred speech, or weakness. Has some numbness in right hand/fingers since Feb 2013. Getting TTE done at bedside.   Objective: Vital signs in last 24 hours: Filed Vitals:   10/29/11 0604 10/29/11 0808 10/29/11 1004 10/29/11 1425  BP: 124/70 126/55 122/62 126/61  Pulse: 55 67 61 70  Temp: 97.9 F (36.6 C) 97.6 F (36.4 C) 97.5 F (36.4 C) 97.9 F (36.6 C)  TempSrc: Oral Oral Oral Oral  Resp: 16 19 19 18   Height:      Weight:      SpO2: 98% 97% 99% 96%   GENERAL EXAM: Patient is in no distress  CARDIOVASCULAR: Regular rate and rhythm, no murmurs, no carotid bruits  NEUROLOGIC: MENTAL STATUS: awake, alert, language fluent, comprehension intact, naming intact CRANIAL NERVE: pupils equal and reactive to light, visual fields full to confrontation, extraocular muscles intact, no nystagmus, facial sensation and strength symmetric, uvula midline, shoulder shrug symmetric, tongue midline. MOTOR: normal bulk and tone, full strength in the BUE, BLE SENSORY: normal and symmetric to light touch COORDINATION: finger-nose-finger, fine finger movements normal REFLEXES: deep tendon reflexes present and symmetric  Intake/Output from previous day: 09/28 0701 - 09/29 0700 In: 351.3 [I.V.:351.3] Out: 200 [Urine:200]  Lab Results:  Basename 10/28/11 2009 10/28/11 1514  WBC 6.9 --  HGB 14.7 15.0  HCT 41.5 44.0  PLT 200 --   BMET  Basename 10/28/11 1514  NA 143  K 3.5  CL 105  CO2 --  GLUCOSE 109*  BUN 16  CREATININE 1.00  CALCIUM --   Lipid Panel     Component Value Date/Time   CHOL 123 10/29/2011 0515   TRIG 63 10/29/2011 0515   HDL 28* 10/29/2011 0515   CHOLHDL 4.4 10/29/2011 0515   VLDL 13 10/29/2011 0515   LDLCALC 82 10/29/2011 0515   No results found for this basename: HGBA1C   Studies/Results:  10/29/2011 CHEST - 2 VIEW  - No acute cardiopulmonary disease.     10/28/2011  CT HEAD - Negative exam.     10/28/2011   MRI HEAD - There is a 3-4 mm area of restricted diffusion centrally within the medulla (image 9 series 10) without other brainstem or cerebellar areas of restricted diffusion.  This is seen only on thin section DWI specifically through the posterior fossa (series 10); routine DWI series is inconclusive. Findings are consistent with an acute small vessel brainstem infarct.  Widely patent intracranial vasculature, specifically the vertebral and basilar arteries appear widely patent.  The left vertebral is dominant.  There is no evidence for dissection based on axial routine MR imaging.  There is no bleed, mass lesion, hydrocephalus, or extra-axial fluid.  There is no atrophy. There is no significant white matter disease.  Normal pituitary and cerebellar tonsils.  No foci of chronic hemorrhage or large vessel infarct. Negative pituitary, cerebellar tonsils, and orbits.  Unremarkable upper cervical region.  No worrisome skull base or osseous lesions.  No acute sinus or mastoid fluid. Incidental shotty cervical lymphadenopathy.  IMPRESSION: Small area of restricted diffusion centrally within the medulla concerning for acute brainstem infarct. No lobar hemorrhage or bleed observed.  Otherwise negative study. Findings discussed with ordering provider.  10/29/11 Carotid u/s - Preliminary report: Bilateral: No evidence of hemodynamically significant internal carotid artery stenosis. Vertebral artery flow is antegrade.  TTE - pending  Medications:    . aspirin  300 mg Rectal Daily   Or  . aspirin  325 mg Oral Daily  . meclizine  25 mg Oral Once  . simvastatin  20 mg Oral q1800   Assessment/Plan: 51 year old male with raynaud's syndrome,  arthritis, prior smoker, here with new onset dizziness. MRI shows subtle medulla, small vessel acute stroke on thin cut DWI views.  PLAN: 1. TTE pending 2. A1c pending 3. Aspirin 325mg  daily 4. PT --> outpatient PT  [facility] - Stroke Team Joycelyn Schmid, MD 10/29/2011, 2:30 PM   Please refer to [email or web address] for on-call Stroke MD

## 2011-10-29 NOTE — Progress Notes (Signed)
SLP Cancellation Note  ST received order for SLE.  Evaluation deferred to 10/30/11. Moreen Fowler MS, CCC-SLP [phone] [facility] 10/29/2011, 1:51 PM

## 2011-10-29 NOTE — Progress Notes (Signed)
VASCULAR LAB PRELIMINARY  PRELIMINARY  PRELIMINARY  PRELIMINARY  Carotid duplex  completed.    Preliminary report:  Bilateral:  No evidence of hemodynamically significant internal carotid artery stenosis.   Vertebral artery flow is antegrade.      Ameila Weldon, RVT 10/29/2011, 10:30 AM

## 2011-10-30 ENCOUNTER — Inpatient Hospital Stay (HOSPITAL_COMMUNITY): Payer: BC Managed Care – PPO

## 2011-10-30 LAB — BASIC METABOLIC PANEL
CO2: 26 mEq/L (ref 19–32)
Calcium: 8.7 mg/dL (ref 8.4–10.5)
Glucose, Bld: 105 mg/dL — ABNORMAL HIGH (ref 70–99)
Sodium: 142 mEq/L (ref 135–145)

## 2011-10-30 LAB — CBC
Hemoglobin: 13.7 g/dL (ref 13.0–17.0)
MCH: 32.9 pg (ref 26.0–34.0)
RBC: 4.16 MIL/uL — ABNORMAL LOW (ref 4.22–5.81)

## 2011-10-30 MED ORDER — POTASSIUM CHLORIDE CRYS ER 20 MEQ PO TBCR
60.0000 meq | EXTENDED_RELEASE_TABLET | Freq: Once | ORAL | Status: AC
  Administered 2011-10-30: 60 meq via ORAL
  Filled 2011-10-30 (×2): qty 3

## 2011-10-30 MED ORDER — IOHEXOL 350 MG/ML SOLN
50.0000 mL | Freq: Once | INTRAVENOUS | Status: AC | PRN
  Administered 2011-10-30: 50 mL via INTRAVENOUS

## 2011-10-30 MED ORDER — DIVALPROEX SODIUM ER 500 MG PO TB24
500.0000 mg | ORAL_TABLET | Freq: Every day | ORAL | Status: DC
  Administered 2011-10-30 – 2011-10-31 (×2): 500 mg via ORAL
  Filled 2011-10-30 (×2): qty 1

## 2011-10-30 NOTE — Progress Notes (Signed)
Stroke team progress note  History: 51 year old male with raynaud's syndrome, arthritis, prior smoker, here with new onset dizziness. MRI shows subtle medulla, small vessel acute stroke on thin cut DWI views.  Subjective: Patient currently working with therapy. Reports taking up to 8 NSAIDS/day for headache.  Objective: Vital signs in last 24 hours: Filed Vitals:   10/29/11 2157 10/30/11 0148 10/30/11 0557 10/30/11 1009  BP: 135/67 139/67 111/65 126/67  Pulse: 61 59 63 65  Temp: 97.8 F (36.6 C) 97.8 F (36.6 C) 97.4 F (36.3 C)   TempSrc: Oral Oral Oral Oral  Resp: 18 16 16 18   Height:      Weight:      SpO2: 98% 99% 98% 97%   GENERAL EXAM: Patient is in no distress  CARDIOVASCULAR: Regular rate and rhythm, no murmurs, no carotid bruits  NEUROLOGIC: MENTAL STATUS: awake, alert, language fluent, comprehension intact, naming intact CRANIAL NERVE: pupils equal and reactive to light, visual fields full to confrontation, extraocular muscles intact, no nystagmus, facial sensation and strength symmetric, uvula midline, shoulder shrug symmetric, tongue midline. MOTOR: normal bulk and tone, full strength in the BUE, BLE SENSORY: normal and symmetric to light touch COORDINATION: finger-nose-finger, fine finger movements normal REFLEXES: deep tendon reflexes present and symmetric  Lab Results:  Basename 10/30/11 0520 10/28/11 2009  WBC 6.3 6.9  HGB 13.7 14.7  HCT 39.4 41.5  PLT 211 200   BMET  Basename 10/30/11 0520 10/28/11 1514  NA 142 143  K 3.1* 3.5  CL 107 105  CO2 26 --  GLUCOSE 105* 109*  BUN 22 16  CREATININE 0.96 1.00  CALCIUM 8.7 --   Lipid Panel     Component Value Date/Time   CHOL 123 10/29/2011 0515   TRIG 63 10/29/2011 0515   HDL 28* 10/29/2011 0515   CHOLHDL 4.4 10/29/2011 0515   VLDL 13 10/29/2011 0515   LDLCALC 82 10/29/2011 0515   Lab Results  Component Value Date   HGBA1C 5.9* 10/29/2011   ANA, ESR, homocysteine  normal  Studies/Results:  10/29/2011 CHEST - 2 VIEW  - No acute cardiopulmonary disease.     10/28/2011  CT HEAD - Negative exam.     10/28/2011   MRI HEAD  Small area of restricted diffusion centrally within the medulla concerning for acute brainstem infarct. No lobar hemorrhage or bleed observed.  Otherwise negative study. Findings discussed with ordering provider.     10/29/2011  MRA head Unremarkable MR angiography of the intracranial circulation. No focal narrowing or occlusion in the vertebrobasilar territory.  10/29/11 Carotid u/s - Preliminary report: Bilateral: No evidence of hemodynamically significant internal carotid artery stenosis. Vertebral artery flow is antegrade.  10/29/2011 TTE - completed, results pending  Medications:     . aspirin  300 mg Rectal Daily   Or  . aspirin  325 mg Oral Daily  . naproxen  375 mg Oral BID WC  . potassium chloride  60 mEq Oral Once  . simvastatin  20 mg Oral q1800   Assessment/Plan: 51 year old male with raynaud's syndrome, arthritis, prior smoker, here with new onset dizziness. MRI shows subtle medulla, small vessel acute stroke on thin cut DWI views. Infarct most likely due to small vessel disease, though could be embolic, including artery-to-artery emboli.   HgbA1c 5.9 LDL 82, goal LDL < 100 for non-diabetic stroke pts.  Chronic headaches.-likely analgesic rebound headaches- Takes 8 ibuprofen/day, which likely contribute to its worsening.   PLAN: 1. Aspirin 325mg   daily 2. Hypercoagulable panel 3. CTA head and neck to rule out dissection 4. PT --> outpatient PT 5. depakote ER daily for headache prevention. Stop taking ibuprofen/naproxen.   Annie Main, MSN, RN, ANVP-BC, ANP-BC, GNP-BC [facility] Pager: [phone] 10/30/2011 11:45 AM  Scribe for Dr. Delia Heady, Stroke Center Medical Director, who has personally reviewed chart, pertinent data, examined the patient and developed the plan of care. Pager:   [phone]

## 2011-10-30 NOTE — Progress Notes (Signed)
Physical Therapy Treatment Patient Details Name: Cory Roberts MRN: [id number] DOB: [birth year] Today's Date: 10/30/2011 Time: 4098-1191 PT Time Calculation (min): 35 min  PT Assessment / Plan / Recommendation Comments on Treatment Session  Pt s/p brainstem CVA with most limiting factor being vertigo. Pt responded well to limiting visual input (via looking down at floor) and increasing sensory input to sense upright position (via use of RW and bil UE support). Discussed d/c options for continued therapy and pt agreeable to OPPT. Discussed probable difficulty with riding in a car to go home and how he can limiti visual input and may need frequent rest stops if nausea returns (sit in back seat, stare at lower part of the seat in front of him, possibly closing his eyes). Family present (nephew) and reports family will provide 24/7 care on d/c.    Follow Up Recommendations  Outpatient PT;Supervision/Assistance - 24 hour    Barriers to Discharge        Equipment Recommendations  Rolling walker with 5" wheels;Tub/shower seat    Recommendations for Other Services    Frequency Min 4X/week   Plan Discharge plan remains appropriate;Frequency remains appropriate    Precautions / Restrictions Precautions Precautions: Fall Precaution Comments: compensatory strategies for vertigo   Pertinent Vitals/Pain Began feeling frontal pressure/headache (6/10); denied need to have pain medicine at this time. Aware to call for RN if he changes his mind.    Mobility  Bed Mobility Bed Mobility: Supine to Sit;Sitting - Scoot to Edge of Bed Supine to Sit: 6: Modified independent (Device/Increase time);HOB flat Sitting - Scoot to Edge of Bed: 6: Modified independent (Device/Increase time) Sit to Supine: 6: Modified independent (Device/Increase time) Transfers Transfers: Sit to Stand;Stand to Sit Sit to Stand: 4: Min assist;With upper extremity assist;From bed Stand to Sit: 4: Min guard;With upper extremity  assist;With armrests;To chair/3-in-1;To bed Details for Transfer Assistance: stood from EOB x 3; posterior loss of balance each time requiring assist to maintain upright position; better control by pt when stood and held to [employer] (incr sensory input through UEs) Ambulation/Gait Ambulation/Gait Assistance: 4: Min assist Ambulation Distance (Feet): 270 Feet Assistive device: Rolling walker;1 person hand held assist Ambulation/Gait Assistance Details: initially attempted HHA on Rt; pt with very wide base of support with staggering losses of balance posteriorly; Pt reporting objects/walls/floor appear to be moving; educated re: fixing eyes on fixed target, but pt unable to stop sense of vertigo unless he looked down at the floor (worsened with closing his eyes); with RW, pt with no posterior sway or LOB; attempted looking up a bit more (looking at floor ~10 feet in front of him, however vertigo returned and resumed lookind down at cross bar of RW vs floor Gait Pattern: Step-through pattern;Decreased stride length;Wide base of support Gait velocity: decr Modified Rankin (Stroke Patients Only) Pre-Morbid Rankin Score: No symptoms Modified Rankin: Moderately severe disability    Exercises     PT Diagnosis:    PT Problem List:   PT Treatment Interventions:     PT Goals Acute Rehab PT Goals Pt will go Sit to Stand: with modified independence;with upper extremity assist PT Goal: Sit to Stand - Progress: Progressing toward goal Pt will go Stand to Sit: with modified independence;with upper extremity assist PT Goal: Stand to Sit - Progress: Progressing toward goal Pt will Ambulate: >150 feet;with supervision PT Goal: Ambulate - Progress: Progressing toward goal  Visit Information  Last PT Received On: 10/30/11 Assistance Needed: +1    Subjective Data  Subjective: The floor is rocking, those chairs are spinning, and it feels like something is pulling me to the right (when pt went sit to  stand). Patient Stated Goal: return to work   Cognition  Overall Cognitive Status: Appears within functional limits for tasks assessed/performed Arousal/Alertness: Awake/alert Orientation Level: Oriented X4 / Intact Behavior During Session: WFL for tasks performed    Balance  Static Sitting Balance Static Sitting - Balance Support: No upper extremity supported;Feet supported Static Sitting - Level of Assistance: 4: Min assist;Other (comment) (on initial changes in position due to vertigo) Static Standing Balance Static Standing - Balance Support: No upper extremity supported Static Standing - Level of Assistance: 4: Min assist  End of Session PT - End of Session Equipment Utilized During Treatment: Gait belt Activity Tolerance: Patient tolerated treatment well Patient left: in chair;with call bell/phone within reach;with family/visitor present   GP     Shia Eber 10/30/2011, 2:38 PM Pager [phone]

## 2011-10-30 NOTE — Care Management Note (Signed)
    Page 1 of 1   11/01/2011     10:16:01 AM   CARE MANAGEMENT NOTE 11/01/2011  Patient:  Cory Roberts, Cory Roberts   Account Number:  [id number]  Date Initiated:  10/30/2011  Documentation initiated by:  Onnie Boer  Subjective/Objective Assessment:   PT WAS ADMITTED WITH DIZZINESS AND WEAKNESS     Action/Plan:   PROGRESSION OF CARE AND DISCHARGE PLANNING   Anticipated DC Date:  10/30/2011   Anticipated DC Plan:  HOME/SELF CARE      DC Planning Services  CM consult      Choice offered to / List presented to:             Status of service:  Completed, signed off Medicare Important Message given?   (If response is "NO", the following Medicare IM given date fields will be blank) Date Medicare IM given:   Date Additional Medicare IM given:    Discharge Disposition:  HOME/SELF CARE  Per UR Regulation:  Reviewed for med. necessity/level of care/duration of stay  If discussed at Long Length of Stay Meetings, dates discussed:    Comments:  11/01/11 Onnie Boer, RN, BSN 1015 I HAVE ARRANGED OP PT AT THE [facility].  PT WAS DC'D TO HOME WITH SELF CARE  10/30/11 Onnie Boer, RN, BSN 1207 PT WAS ADMITTED WITH WEAKNESS AND DIZZINESS, PTA PT WAS AT HOME WITH SELF CARE.  PT MAY NEED OP PT AT DC, WILL F/U WITH PATIENT AND ATTENDING.

## 2011-10-30 NOTE — Evaluation (Signed)
Speech Language Pathology Evaluation Patient Details Name: Cory Roberts MRN: [id number] DOB: Dec 13, 1960 Today's Date: 10/30/2011 Time: 2130-8657 SLP Time Calculation (min): 26 min  Problem List:  Patient Active Problem List  Diagnosis  . Calf tenderness  . CVA (cerebral infarction)  . Vertigo  . Migraine with aura   Past Medical History:  Past Medical History  Diagnosis Date  . Raynaud's syndrome   . Arthritis Feb. 2013    Gout- Right foot  . Gout    Past Surgical History:  Past Surgical History  Procedure Date  . Hernia repair 2007   HPI:  51 y/o left handed male with h/x of Raynaud's syndrome, arthritis, and gout. Admitted to [facility] with acute brainstem infarct, ataxia and vertigo. CXR came out neg. Pt. lives at home with wife.    Assessment / Plan / Recommendation Clinical Impression  Pt. seen for speech-language-cognitive evaluation, he is alert and pleasant at bedside.  Per Pt. his speech and language are at baseline. Pt recalled detailed information from conversation with MD/staff, however states that he does not remember having CT scan at [facility].  Speech, language, auditory and reading comprehension, attention, memory were [facility] for the tasks assessed. At this time, no ST is recommended.      SLP Assessment  Patient does not need any further Speech Lanaguage Pathology Services    Follow Up Recommendations  None    Frequency and Duration           SLP Goals     SLP Evaluation Prior Functioning  Cognitive/Linguistic Baseline: Within functional limits Type of Home: Mobile home Lives With: Spouse Available Help at Discharge: Family;Available PRN/intermittently Vocation: Full time employment   Cognition  Overall Cognitive Status: Appears within functional limits for tasks assessed Arousal/Alertness: Awake/alert Orientation Level: Oriented X4 Attention: Alternating Memory: Appears intact Awareness: Appears intact Safety/Judgment: Appears intact      Comprehension  Auditory Comprehension Overall Auditory Comprehension: Appears within functional limits for tasks assessed Yes/No Questions: Not tested Commands: Within Functional Limits Conversation: Simple Visual Recognition/Discrimination Discrimination: Not tested Reading Comprehension Reading Status: Within funtional limits    Expression Expression Primary Mode of Expression: Verbal Verbal Expression Overall Verbal Expression: Appears within functional limits for tasks assessed Initiation: No impairment Level of Generative/Spontaneous Verbalization: Conversation Naming: Not tested Pragmatics: No impairment Non-Verbal Means of Communication: Not applicable Written Expression Dominant Hand: Left Written Expression: Not tested   Oral / Motor Oral Motor/Sensory Function Overall Oral Motor/Sensory Function: Appears within functional limits for tasks assessed Labial ROM: Within Functional Limits Labial Symmetry: Within Functional Limits Labial Strength: Within Functional Limits Labial Sensation: Within Functional Limits Lingual ROM: Within Functional Limits Lingual Symmetry: Within Functional Limits Lingual Strength: Within Functional Limits Lingual Sensation: Within Functional Limits Facial ROM: Within Functional Limits Facial Symmetry: Within Functional Limits Facial Strength: Within Functional Limits Facial Sensation: Within Functional Limits Velum: Within Functional Limits Mandible: Within Functional Limits Motor Speech Overall Motor Speech: Appears within functional limits for tasks assessed Respiration: Within functional limits Phonation: Normal Resonance: Within functional limits Articulation: Within functional limitis Intelligibility: Intelligible Motor Planning: Witnin functional limits Motor Speech Errors: Not applicable   GO     Cory Roberts 10/30/2011, 1:28 PM

## 2011-10-30 NOTE — Discharge Summary (Addendum)
Physician Discharge Summary  Cory Roberts:[id number] DOB: 08-Sep-1960 DOA: 10/28/2011  PCP: Cassell Smiles., MD  Admit date: 10/28/2011 Discharge date: 10/31/2011  Recommendations for Outpatient Follow-up:  1. Outpatient physical therapy  2. Follow up with PCP within one week for blood pressure check, management of migraine, and follow up of hypercoagulable work up labs:  Protein C activity, Protein C total, Protein S activity, protein S total, lupus anticoagulant, beta-2-glycoprotein, factor 5 leiden, cardiolipin pending.   3. Repeat lipid panel in 4-6 weeks and A1c in 3-6 months. 4. Dr. Pearlean Brownie for TCD in ~ 1 month    Discharge Diagnoses:  Principal Problem:  *CVA (cerebral infarction) Active Problems:  Vertigo  Migraine with aura  Obesity  Dyslipidemia  At risk for diabetes mellitus   Discharge Condition: stable, improved  Diet recommendation:  Healthy heart  Wt Readings from Last 3 Encounters:  10/28/11 86.183 kg (190 lb)  05/15/11 86.546 kg (190 lb 12.8 oz)    History of present illness:   Cory Roberts is a 51 y.o. male  has a past medical history of Raynaud's syndrome; Arthritis (Feb. 2013); and Gout.   Since 10 am he started to feel like the room is spinning and he felt nausea. He presented to [facility] and was given a meclozine with some improvement. He was transferred for further work up to [facility] for further work up. An MRI done showed small brain stem infarct. Neurology is aware and have seen the patient. Currently his symptoms have resolved bu 8:30 pm. At the time he had trouble standing up due to ataxia.   Hospital Course:   Medullary infarct with residual off-balance sensation. MRA head demonstrated no significant narrowing. CT angio of head and neck demonstrated no dissection.  Carotid duplex demonstrated no stenosis.  Due to patient have minimal risk factors and relatively young age, neurology recommending hypercoagulable work up.  Antithrombin III 100% wnl, ANA  negative, homocystein wnl.  Protein C activity, Protein C total, Protein S activity, protein S total, lupus anticoagulant, beta-2-glycoprotein, factor 5 leiden, cardiolipin pending.  He was evaluated by PT who recommended outpatient physical therapy.  His blood pressure remained well controlled.  His cholesterol panel demonstrated an LDL < 100, but a very low HDL at 28.  He was started on simvastatin.  His hemoglobin A1c was mildly elevated and he was counseled on diet and exercise to prevent progression to diabetes.  His telemetry showed no significant arrhythmias and his ECHO demonstrated an EF of 60-65% with no wall motion abnormalities or valvular dysfunction.  He should follow up with Dr. Pearlean Brownie for outpatient cranial duplex to rule out PFO in a few weeks.  Instructions provided   Dyslipidemia, started simvastatin. Will need repeat lipid panel in 4-6 weeks.   Migraine headaches with overuse of ibuprofen and rebound headaches.  He was advised to stop taking ibuprofen and was started on Depakote ER 500mg  po daily for HA prophylaxis.    Procedures:  CT head 9/28  CXR 9/29  MRI brain 9/28  MRA brain 9/29  Carotid duplex 9/29  TTE 9/29  Consultations:  Neurology  Discharge Exam: Filed Vitals:   10/31/11 1030  BP: 128/67  Pulse: 80  Temp: 97.5 F (36.4 C)  Resp: 18   Filed Vitals:   10/30/11 1802 10/30/11 2054 10/31/11 0135 10/31/11 1030  BP: 128/76 136/78 118/64 128/67  Pulse: 71 69 68 80  Temp: 97.6 F (36.4 C) 97.6 F (36.4 C) 97.8 F (36.6 C)  97.5 F (36.4 C)  TempSrc: Oral Oral Oral Oral  Resp: 18 18 18 18   Height:      Weight:      SpO2: 99% 99% 96% 99%    General: Obese caucasian male, no acute distress  HEENT: MMM Cardiovascular: RRR, no m/r/g, 2+ pulses  Respiratory: CTA  Abdomen: NABS, soft, obese, mildly tender with deep palpation right upper and lower quadrants without rebound or guarding  EXT: No edema  Psych: A&Ox4  Neuro: II-XII grossly intact  except visual fields and acuity not tested. Face symmetrric, Strength 5/5, sensation intact, no dysmetria  Discharge Instructions      Discharge Orders    Future Orders Please Complete By Expires   Diet - low sodium heart healthy      Increase activity slowly      Discharge instructions      Comments:   You were hospitalized with a stroke and had balance problems.  You did not have narrowing of the blood vessels in the neck or brain.  Your heart structure and function looked normal and you did not have serious arrhythmias on telemetry. You had blood tests to check for hypercoagulability and many of these tests are pending.  Your primary care doctor should follow up the results of these tests within one week.  You were found to have bad cholesterol and you were started on a medication to improve your cholesterol. You also have borderline diabetes.  Please eat less red meat, decrease fatty foods and increase fiber and lean proteins like fish and chicken.  Exercise every day for at least 30 minutes and you are encouraged to lose some weight.  Talk to your primary care doctor.  Please take an adult aspirin 325mg  daily to reduce the risk of having a future stroke.  Finally, please stop taking so much ibuprofen for headaches as this may be causing some of your headache.  Use depakote to prevent headaches.  If you continue to have headaches, your primary care doctor may increase the dose.   Call MD for:      Comments:   Call 911 if you have chest pain, shortness of breath, slurred speech, numbness or weakness of an arm or leg, or facial droop.   Call MD for:  temperature >100.4      Call MD for:  persistant nausea and vomiting      Call MD for:  severe uncontrolled pain      Call MD for:  difficulty breathing, headache or visual disturbances      Call MD for:  hives      Call MD for:  persistant dizziness or light-headedness      Call MD for:  extreme fatigue          Medication List     As of  10/31/2011 11:33 AM    TAKE these medications         aspirin 325 MG tablet   Take 1 tablet (325 mg total) by mouth daily.      divalproex 500 MG 24 hr tablet   Commonly known as: DEPAKOTE ER   Take 1 tablet (500 mg total) by mouth daily.      simvastatin 20 MG tablet   Commonly known as: ZOCOR   Take 1 tablet (20 mg total) by mouth daily at 6 PM.         Follow-up Information    Follow up with Gates Rigg, MD. Schedule an appointment as soon  as possible for a visit in 1 month. (as for "TCD with bubble study" when you call for appt. )    Contact information:   [address] [facility] [place] [address] [phone]       Follow up with Cassell Smiles., MD. Schedule an appointment as soon as possible for a visit in 1 week.   Contact information:   [address] [place] [address] [phone]           The results of significant diagnostics from this hospitalization (including imaging, microbiology, ancillary and laboratory) are listed below for reference.    Significant Diagnostic Studies: Dg Chest 2 View  10/29/2011  *RADIOLOGY REPORT*  Clinical Data: "Stroke."  Arthritis.  Gout.  CHEST - 2 VIEW  Comparison: None.  Findings: Lateral view degraded by patient arm position.  Midline trachea.  Normal heart size and mediastinal contours. No pleural effusion or pneumothorax.  Clear lungs.  IMPRESSION: No acute cardiopulmonary disease.   Original Report Authenticated By: Consuello Bossier, M.D.    Ct Head Wo Contrast  10/28/2011  *RADIOLOGY REPORT*  Clinical Data: Dizziness, nausea and vomiting.  CT HEAD WITHOUT CONTRAST  Technique:  Contiguous axial images were obtained from the base of the skull through the vertex without contrast.  Comparison: Brain MRI 07/22/2008.  Findings: The brain appears normal with evidence of infarct, hemorrhage, mass lesion, mass effect, midline shift or abnormal extra-axial fluid  collection.  No hydrocephalus or pneumocephalus. Calvarium intact.  IMPRESSION: Negative exam.   Original Report Authenticated By: Bernadene Bell. Maricela Curet, M.D.    Mr Maxine Glenn Head Wo Contrast  10/29/2011  *RADIOLOGY REPORT*  Clinical Data: Brainstem CVA.  Raynaud's syndrome.  Evaluate for cerebrovascular disease. Abnormal gait today.  MRA HEAD WITHOUT CONTRAST  Technique: Angiographic images of the Circle of Willis were obtained using MRA technique without intravenous contrast.  Comparison: MRI brain 10/28/2011  Findings: The internal carotid arteries are widely patent.  The basilar artery is widely patent with both vertebrals contributing, the left dominant.  The right vertebral is smaller but does not display a focal stenosis. There is no anterior, middle, or posterior cerebral artery stenosis.  No intracranial aneurysm is seen.  There is no cerebellar branch occlusion.  IMPRESSION: Unremarkable MR angiography of the intracranial circulation. No focal narrowing or occlusion in the vertebrobasilar territory.   Original Report Authenticated By: Elsie Stain, M.D.    Mr Brain Wo Contrast  10/28/2011  *RADIOLOGY REPORT*  Clinical Data: Acute onset of vertigo earlier today.  Still slightly dizzy. History of Raynaud's syndrome.  History of gout.  MRI HEAD WITHOUT CONTRAST  Technique:  Multiplanar, multiecho pulse sequences of the brain and surrounding structures were obtained according to standard protocol without intravenous contrast.  Comparison: CT head earlier in the day.  Findings: There is a 3-4 mm area of restricted diffusion centrally within the medulla (image 9 series 10) without other brainstem or cerebellar areas of restricted diffusion.  This is seen only on thin section DWI specifically through the posterior fossa (series 10); routine DWI series is inconclusive. Findings are consistent with an acute small vessel brainstem infarct.  Widely patent intracranial vasculature, specifically the vertebral and  basilar arteries appear widely patent.  The left vertebral is dominant.  There is no evidence for dissection based on axial routine MR imaging.  There is no bleed, mass lesion, hydrocephalus, or extra-axial fluid.  There is no atrophy. There is no significant white  matter disease.  Normal pituitary and cerebellar tonsils.  No foci of chronic hemorrhage or large vessel infarct. Negative pituitary, cerebellar tonsils, and orbits.  Unremarkable upper cervical region.  No worrisome skull base or osseous lesions.  No acute sinus or mastoid fluid. Incidental shotty cervical lymphadenopathy.  IMPRESSION: Small area of restricted diffusion centrally within the medulla concerning for acute brainstem infarct. No lobar hemorrhage or bleed observed.  Otherwise negative study. Findings discussed with ordering provider.   Original Report Authenticated By: Elsie Stain, M.D.     Microbiology: No results found for this or any previous visit (from the past 240 hour(s)).   Labs: Basic Metabolic Panel:  Lab 10/31/11 1610 10/30/11 0520 10/28/11 1514  NA 139 142 143  K 3.3* 3.1* 3.5  CL 103 107 105  CO2 29 26 --  GLUCOSE 106* 105* 109*  BUN 19 22 16   CREATININE 0.95 0.96 1.00  CALCIUM 8.9 8.7 --  MG -- -- --  PHOS -- -- --   Liver Function Tests: No results found for this basename: AST:5,ALT:5,ALKPHOS:5,BILITOT:5,PROT:5,ALBUMIN:5 in the last 168 hours No results found for this basename: LIPASE:5,AMYLASE:5 in the last 168 hours No results found for this basename: AMMONIA:5 in the last 168 hours CBC:  Lab 10/31/11 0640 10/30/11 0520 10/28/11 2009 10/28/11 1514  WBC 6.8 6.3 6.9 --  NEUTROABS -- -- 3.8 --  HGB 13.8 13.7 14.7 15.0  HCT 40.6 39.4 41.5 44.0  MCV 94.6 94.7 92.0 --  PLT 203 211 200 --   Cardiac Enzymes: No results found for this basename: CKTOTAL:5,CKMB:5,CKMBINDEX:5,TROPONINI:5 in the last 168 hours BNP: BNP (last 3 results) No results found for this basename: PROBNP:3 in the last 8760  hours CBG: No results found for this basename: GLUCAP:5 in the last 168 hours  Time coordinating discharge: 45  minutes  Signed:  Raun Routh  [facility] 10/31/2011, 11:33 AM

## 2011-10-30 NOTE — Evaluation (Signed)
SLP has reviewed and agrees with student's note below.  Briseidy Spark Willis Abdimalik Mayorquin M.Ed CCC-SLP Pager [phone]  10/30/2011  

## 2011-10-30 NOTE — Evaluation (Signed)
Occupational Therapy Evaluation Patient Details Name: Cory Roberts MRN: [id number] DOB: [birth year] Today's Date: 10/30/2011 Time: 1201-1230 OT Time Calculation (min): 29 min  OT Assessment / Plan / Recommendation Clinical Impression  Pt presenting with balance/equilibrium deficits from brain stem infarct.  Acute OT to follow to maximize functional I with ADL activity for safe d/c home.     OT Assessment  Patient needs continued OT Services    Follow Up Recommendations  Outpatient OT;Other (comment)       Equipment Recommendations  Tub/shower seat (if pt agrees)       Frequency       Precautions / Restrictions Precautions Precautions: Fall       ADL  Eating/Feeding: Performed;Independent Where Assessed - Eating/Feeding: Edge of bed Grooming: Performed;Min guard;Other (comment) (leaned posteriorly- able to self correct) Where Assessed - Grooming: Unsupported standing Upper Body Bathing: Simulated;Set up Where Assessed - Upper Body Bathing: Unsupported sitting Lower Body Bathing: Simulated;Minimal assistance Where Assessed - Lower Body Bathing: Unsupported sit to stand Upper Body Dressing: Simulated;Set up Where Assessed - Upper Body Dressing: Unsupported sitting Lower Body Dressing: Simulated;Minimal assistance Where Assessed - Lower Body Dressing: Unsupported sit to stand Toilet Transfer: Performed;Min guard Toilet Transfer Method: Sit to Barista: Comfort height toilet Toileting - Clothing Manipulation and Hygiene: Min guard;Simulated Where Assessed - Engineer, mining and Hygiene: Standing Tub/Shower Transfer: Min guard    OT Diagnosis: Generalized weakness  OT Problem List: Decreased strength;Impaired balance (sitting and/or standing) OT Treatment Interventions: Self-care/ADL training;Patient/family education;Balance training   OT Goals Acute Rehab OT Goals OT Goal Formulation: With patient ADL Goals Pt Will Perform  Grooming: Independently;Standing at sink ADL Goal: Grooming - Progress: Goal set today Pt Will Perform Lower Body Dressing: Independently;Sit to stand from chair ADL Goal: Lower Body Dressing - Progress: Goal set today Pt Will Transfer to Toilet: Independently;Comfort height toilet ADL Goal: Toilet Transfer - Progress: Goal set today Pt Will Perform Toileting - Clothing Manipulation: Independently ADL Goal: Toileting - Clothing Manipulation - Progress: Goal set today Pt Will Perform Tub/Shower Transfer: Tub transfer ADL Goal: Tub/Shower Transfer - Progress: Goal set today  Visit Information  Last OT Received On: 10/30/11    Subjective Data  Subjective: i lean to the right sometimes   Prior Functioning     Home Living Lives With: Spouse Available Help at Discharge: Family;Available PRN/intermittently Type of Home: Mobile home Home Access: Stairs to enter Entrance Stairs-Number of Steps: 3 Entrance Stairs-Rails: None Home Layout: One level Bathroom Shower/Tub: Tub/shower unit;Door [employer]: Standard Prior Function Level of Independence: Independent Able to Take Stairs?: Yes Driving: Yes Vocation: Full time employment Communication Communication: No difficulties Dominant Hand: Left            Cognition  Overall Cognitive Status: Appears within functional limits for tasks assessed/performed Arousal/Alertness: Awake/alert Orientation Level: Oriented X4 / Intact Behavior During Session: [facility] for tasks performed    Extremity/Trunk Assessment Right Upper Extremity Assessment RUE ROM/Strength/Tone: [facility] for tasks assessed Left Upper Extremity Assessment LUE ROM/Strength/Tone: WFL for tasks assessed     Mobility Bed Mobility Bed Mobility: Supine to Sit;Sit to Supine Supine to Sit: 6: Modified independent (Device/Increase time) Sit to Supine: 6: Modified independent (Device/Increase time) Transfers Sit to Stand: 4: Min guard;From bed;With upper extremity  assist Stand to Sit: 4: Min guard;To chair/3-in-1;To bed;With upper extremity assist Details for Transfer Assistance: verbal cues for hand placement, safety  End of Session OT - End of Session Activity Tolerance: Patient tolerated treatment well Patient left: in chair  GO     Cory Roberts, [place] Kung 10/30/2011, 12:47 PM

## 2011-10-30 NOTE — Progress Notes (Signed)
[facility] PROGRESS NOTE  Cory Roberts RUE:[id number] DOB: 1960/12/30 DOA: 10/28/2011 PCP: Cory Roberts., MD  Assessment/Plan: Principal Problem:  *CVA (cerebral infarction) Active Problems:  Vertigo  Migraine with aura  Medullary infarct with residual off-balance sensation.  MRA head demonstrated no significant narrowing.  CT angio of head and neck demonstrated no dissection.  Due to patient have minimal risk factors and relatively young age, neurology recommending hypercoagulable work up.   -  Hypercoagulability work up ordered.   -  Appreciate neurology assistance -  Continue ASA 325mg  daily -  ECHO pending -  ANA was negative and homocysteine level was within normal limits. -  Carotid duplex negative -  PT recommending outpatient PT -  Blood pressure appears well controlled.  Will monitor -  LDL at goal, but HDL very low, start simvastatin -  A1c mildly elevated in borderline diabetes range.  Education provided  Migraine headaches with overuse of ibuprofen and likely rebound headache.  Patient may be having some medication withdrawal headache.  Appreciate neurology recommendations. -  Depakote ER 500mg  po daily for HA prophylaxis  Dyslipidemia, started simvastatin.  Will need repeat lipid panel in 4-6 weeks.  DIET:  Healthy heart ACCESS:  PIV IVF:  OFF PROPH:  SCDs  Code Status: Full code Family Communication: Spoke with patient and wife and extended family regarding plan of care Disposition Plan: Pending completion of stroke evaluation.     Brief narrative:  Cory Roberts is a 51 y.o. male  has a past medical history of Raynaud's syndrome; Arthritis (Feb. 2013); and Gout.  Presented with  Since 10 am he started to feel like the room is spinning and he felt nausea. He presented to [facility] and was given a meclozine with some improvement. He was transferred for further work up to [facility] for further work up. An MRI done showed small brain stem infarct. Neurology  is aware and have seen the patient. Currently his symptoms have resolved bu 8:30 pm. At the time he had trouble standing up due to ataxia.    Consultants:  Neurology  Procedures:  CT head 9/28  MRI brain 9/28  Carotid duplex 9/29  ECHO 9/29  MRA brain pending  Antibiotics:  None  HPI/Subjective: Patient states that his dizziness is much better.  Denies new focal numbness, tingling, weakness, slurred speech, facial droop.  Denies chest pain, shortness of breath, nausea, vomiting, diarrhea.     Objective: Filed Vitals:   10/30/11 0148 10/30/11 0557 10/30/11 1009 10/30/11 1338  BP: 139/67 111/65 126/67 134/78  Pulse: 59 63 65 75  Temp: 97.8 F (36.6 C) 97.4 F (36.3 C)  97.4 F (36.3 C)  TempSrc: Oral Oral Oral Oral  Resp: 16 16 18 18   Height:      Weight:      SpO2: 99% 98% 97% 98%    Intake/Output Summary (Last 24 hours) at 10/30/11 1717 Last data filed at 10/30/11 1226  Gross per 24 hour  Intake    360 ml  Output      0 ml  Net    360 ml   Filed Weights   10/28/11 1416  Weight: 86.183 kg (190 lb)    Exam:   General:  Obese caucasian male, no acute distress  HEENT:  MMM, anicteric, noninjected  Cardiovascular: RRR, no m/r/g, 2+ pulses  Respiratory: CTA  Abdomen: NABS, soft, obese, nontender  EXT:  No edema  Psych:  A&Ox4  Neuro:  II-XII grossly intact except  visual fields and acuity not tested.  Strength 5/5, sensation intact.    Data Reviewed: Basic Metabolic Panel:  Lab 10/30/11 8119 10/28/11 1514  NA 142 143  K 3.1* 3.5  CL 107 105  CO2 26 --  GLUCOSE 105* 109*  BUN 22 16  CREATININE 0.96 1.00  CALCIUM 8.7 --  MG -- --  PHOS -- --   Liver Function Tests: No results found for this basename: AST:5,ALT:5,ALKPHOS:5,BILITOT:5,PROT:5,ALBUMIN:5 in the last 168 hours No results found for this basename: LIPASE:5,AMYLASE:5 in the last 168 hours No results found for this basename: AMMONIA:5 in the last 168 hours CBC:  Lab 10/30/11  0520 10/28/11 2009 10/28/11 1514  WBC 6.3 6.9 --  NEUTROABS -- 3.8 --  HGB 13.7 14.7 15.0  HCT 39.4 41.5 44.0  MCV 94.7 92.0 --  PLT 211 200 --   Cardiac Enzymes: No results found for this basename: CKTOTAL:5,CKMB:5,CKMBINDEX:5,TROPONINI:5 in the last 168 hours BNP (last 3 results) No results found for this basename: PROBNP:3 in the last 8760 hours CBG: No results found for this basename: GLUCAP:5 in the last 168 hours  No results found for this or any previous visit (from the past 240 hour(s)).   Studies: Ct Angio Head W/cm &/or Wo Cm  10/30/2011  *RADIOLOGY REPORT*  Clinical Data:  Vertigo.  Brain stem stroke.  Acute but ill-defined vascular disease.  CT ANGIOGRAPHY HEAD AND NECK  Technique:  Multidetector CT imaging of the head and neck was performed using the standard protocol during bolus administration of intravenous contrast.  Multiplanar CT image reconstructions including MIPs were obtained to evaluate the vascular anatomy. Carotid stenosis measurements (when applicable) are obtained utilizing NASCET criteria, using the distal internal carotid diameter as the denominator.  Contrast: 50mL OMNIPAQUE IOHEXOL 350 MG/ML SOLN the  Comparison:  MRI 10/28/2011 and 10/29/2011.  CTA NECK  Findings:  Lung apices are clear.  No superior mediastinal pathology.  The patient does have some atherosclerosis of the aortic arch but no sign of dissection or aneurysm.  Branching pattern of the brachiocephalic vessels from the arch is normal.  No origin stenoses.  Both common carotid arteries are widely patent to their respective bifurcation.  On the right, there is a small with back at the proximal internal carotid artery bulb. There is no narrowing or other irregularity.  No ulceration. Beyond that, the cervical internal carotid artery looks normal.  On the left, there is atherosclerotic disease at the carotid bifurcation and internal carotid artery bulb region.  Minimal luminal diameter is 6 mm however,  therefore there is no measurable stenosis compared to the more distal cervical ICA.  Both vertebral artery origins appear widely patent.  The left vertebral artery is larger than the right.  There is no evidence of vertebral artery pathology through the cervical region.   Review of the MIP images confirms the above findings.  IMPRESSION: Mild atherosclerotic disease of the aortic arch and of both carotid bifurcations, but without measurable stenosis or significant irregularity.  No posterior circulation pathology evident.  CTA HEAD  Findings:  The brain itself again appears normal without evidence of identifiable infarction, mass lesion, hemorrhage, hydrocephalus or extra-axial collection.  No abnormal contrast enhancement.  Both internal carotid arteries are widely patent through the siphon regions.  Both anterior and middle cerebral arteries appear normal without proximal stenosis, aneurysm or vascular malformation.  Both vertebral arteries are patent to the basilar with the left being dominant.  No basilar stenosis.  Posterior circulation branch vessels  appear normal.  Venous structures appear intact.   Review of the MIP images confirms the above findings.  IMPRESSION: Continued normal appearance of the brain by CT.  Normal CT angiography of the intracranial vessels.   Original Report Authenticated By: Thomasenia Sales, M.D.    Dg Chest 2 View  10/29/2011  *RADIOLOGY REPORT*  Clinical Data: "Stroke."  Arthritis.  Gout.  CHEST - 2 VIEW  Comparison: None.  Findings: Lateral view degraded by patient arm position.  Midline trachea.  Normal heart size and mediastinal contours. No pleural effusion or pneumothorax.  Clear lungs.  IMPRESSION: No acute cardiopulmonary disease.   Original Report Authenticated By: Consuello Bossier, M.D.    Ct Angio Neck W/cm &/or Wo/cm  10/30/2011  *RADIOLOGY REPORT*  Clinical Data:  Vertigo.  Brain stem stroke.  Acute but ill-defined vascular disease.  CT ANGIOGRAPHY HEAD AND NECK   Technique:  Multidetector CT imaging of the head and neck was performed using the standard protocol during bolus administration of intravenous contrast.  Multiplanar CT image reconstructions including MIPs were obtained to evaluate the vascular anatomy. Carotid stenosis measurements (when applicable) are obtained utilizing NASCET criteria, using the distal internal carotid diameter as the denominator.  Contrast: 50mL OMNIPAQUE IOHEXOL 350 MG/ML SOLN the  Comparison:  MRI 10/28/2011 and 10/29/2011.  CTA NECK  Findings:  Lung apices are clear.  No superior mediastinal pathology.  The patient does have some atherosclerosis of the aortic arch but no sign of dissection or aneurysm.  Branching pattern of the brachiocephalic vessels from the arch is normal.  No origin stenoses.  Both common carotid arteries are widely patent to their respective bifurcation.  On the right, there is a small with back at the proximal internal carotid artery bulb. There is no narrowing or other irregularity.  No ulceration. Beyond that, the cervical internal carotid artery looks normal.  On the left, there is atherosclerotic disease at the carotid bifurcation and internal carotid artery bulb region.  Minimal luminal diameter is 6 mm however, therefore there is no measurable stenosis compared to the more distal cervical ICA.  Both vertebral artery origins appear widely patent.  The left vertebral artery is larger than the right.  There is no evidence of vertebral artery pathology through the cervical region.   Review of the MIP images confirms the above findings.  IMPRESSION: Mild atherosclerotic disease of the aortic arch and of both carotid bifurcations, but without measurable stenosis or significant irregularity.  No posterior circulation pathology evident.  CTA HEAD  Findings:  The brain itself again appears normal without evidence of identifiable infarction, mass lesion, hemorrhage, hydrocephalus or extra-axial collection.  No abnormal  contrast enhancement.  Both internal carotid arteries are widely patent through the siphon regions.  Both anterior and middle cerebral arteries appear normal without proximal stenosis, aneurysm or vascular malformation.  Both vertebral arteries are patent to the basilar with the left being dominant.  No basilar stenosis.  Posterior circulation branch vessels appear normal.  Venous structures appear intact.   Review of the MIP images confirms the above findings.  IMPRESSION: Continued normal appearance of the brain by CT.  Normal CT angiography of the intracranial vessels.   Original Report Authenticated By: Thomasenia Sales, M.D.    Mr Maxine Glenn Head Wo Contrast  10/29/2011  *RADIOLOGY REPORT*  Clinical Data: Brainstem CVA.  Raynaud's syndrome.  Evaluate for cerebrovascular disease. Abnormal gait today.  MRA HEAD WITHOUT CONTRAST  Technique: Angiographic images of the Circle of Willis  were obtained using MRA technique without intravenous contrast.  Comparison: MRI brain 10/28/2011  Findings: The internal carotid arteries are widely patent.  The basilar artery is widely patent with both vertebrals contributing, the left dominant.  The right vertebral is smaller but does not display a focal stenosis. There is no anterior, middle, or posterior cerebral artery stenosis.  No intracranial aneurysm is seen.  There is no cerebellar branch occlusion.  IMPRESSION: Unremarkable MR angiography of the intracranial circulation. No focal narrowing or occlusion in the vertebrobasilar territory.   Original Report Authenticated By: Elsie Stain, M.D.    Mr Brain Wo Contrast  10/28/2011  *RADIOLOGY REPORT*  Clinical Data: Acute onset of vertigo earlier today.  Still slightly dizzy. History of Raynaud's syndrome.  History of gout.  MRI HEAD WITHOUT CONTRAST  Technique:  Multiplanar, multiecho pulse sequences of the brain and surrounding structures were obtained according to standard protocol without intravenous contrast.  Comparison:  CT head earlier in the day.  Findings: There is a 3-4 mm area of restricted diffusion centrally within the medulla (image 9 series 10) without other brainstem or cerebellar areas of restricted diffusion.  This is seen only on thin section DWI specifically through the posterior fossa (series 10); routine DWI series is inconclusive. Findings are consistent with an acute small vessel brainstem infarct.  Widely patent intracranial vasculature, specifically the vertebral and basilar arteries appear widely patent.  The left vertebral is dominant.  There is no evidence for dissection based on axial routine MR imaging.  There is no bleed, mass lesion, hydrocephalus, or extra-axial fluid.  There is no atrophy. There is no significant white matter disease.  Normal pituitary and cerebellar tonsils.  No foci of chronic hemorrhage or large vessel infarct. Negative pituitary, cerebellar tonsils, and orbits.  Unremarkable upper cervical region.  No worrisome skull base or osseous lesions.  No acute sinus or mastoid fluid. Incidental shotty cervical lymphadenopathy.  IMPRESSION: Small area of restricted diffusion centrally within the medulla concerning for acute brainstem infarct. No lobar hemorrhage or bleed observed.  Otherwise negative study. Findings discussed with ordering provider.   Original Report Authenticated By: Elsie Stain, M.D.     Scheduled Meds:    . aspirin  300 mg Rectal Daily   Or  . aspirin  325 mg Oral Daily  . divalproex  500 mg Oral Daily  . potassium chloride  60 mEq Oral Once  . simvastatin  20 mg Oral q1800  . DISCONTD: naproxen  375 mg Oral BID WC   Continuous Infusions:   Principal Problem:  *CVA (cerebral infarction) Active Problems:  Vertigo  Migraine with aura    Time spent: 30    Demichael Traum, [facility]  [facility] Pager [phone]. If 8PM-8AM, please contact night-coverage at [email or web address], password [facility] 10/30/2011, 5:17 PM  LOS: 2 days

## 2011-10-31 DIAGNOSIS — G43109 Migraine with aura, not intractable, without status migrainosus: Secondary | ICD-10-CM

## 2011-10-31 DIAGNOSIS — E785 Hyperlipidemia, unspecified: Secondary | ICD-10-CM

## 2011-10-31 DIAGNOSIS — Z9189 Other specified personal risk factors, not elsewhere classified: Secondary | ICD-10-CM

## 2011-10-31 DIAGNOSIS — Z789 Other specified health status: Secondary | ICD-10-CM

## 2011-10-31 DIAGNOSIS — E669 Obesity, unspecified: Secondary | ICD-10-CM

## 2011-10-31 LAB — BASIC METABOLIC PANEL
BUN: 19 mg/dL (ref 6–23)
CO2: 29 mEq/L (ref 19–32)
Calcium: 8.9 mg/dL (ref 8.4–10.5)
Creatinine, Ser: 0.95 mg/dL (ref 0.50–1.35)
Glucose, Bld: 106 mg/dL — ABNORMAL HIGH (ref 70–99)

## 2011-10-31 LAB — LUPUS ANTICOAGULANT PANEL
DRVVT: 35.3 secs (ref <45.1)
PTT Lupus Anticoagulant: 29.9 secs (ref 28.0–43.0)

## 2011-10-31 LAB — CBC
HCT: 40.6 % (ref 39.0–52.0)
Hemoglobin: 13.8 g/dL (ref 13.0–17.0)
MCH: 32.2 pg (ref 26.0–34.0)
MCV: 94.6 fL (ref 78.0–100.0)
Platelets: 203 10*3/uL (ref 150–400)
RBC: 4.29 MIL/uL (ref 4.22–5.81)

## 2011-10-31 LAB — CARDIOLIPIN ANTIBODIES, IGG, IGM, IGA
Anticardiolipin IgA: 3 APL U/mL — ABNORMAL LOW (ref <22)
Anticardiolipin IgG: 6 GPL U/mL — ABNORMAL LOW (ref <23)

## 2011-10-31 LAB — BETA-2-GLYCOPROTEIN I ABS, IGG/M/A: Beta-2-Glycoprotein I IgA: 5 A Units (ref <20)

## 2011-10-31 LAB — PROTEIN S, TOTAL: Protein S Ag, Total: 128 % (ref 60–150)

## 2011-10-31 MED ORDER — DIVALPROEX SODIUM ER 500 MG PO TB24: 500.0000 mg | ORAL_TABLET | Freq: Every day | ORAL | Status: DC

## 2011-10-31 MED ORDER — POTASSIUM CHLORIDE CRYS ER 20 MEQ PO TBCR
40.0000 meq | EXTENDED_RELEASE_TABLET | Freq: Once | ORAL | Status: AC
  Administered 2011-10-31: 40 meq via ORAL
  Filled 2011-10-31: qty 2

## 2011-10-31 MED ORDER — SIMVASTATIN 20 MG PO TABS: 20.0000 mg | ORAL_TABLET | Freq: Every day | ORAL | Status: DC

## 2011-10-31 MED ORDER — ASPIRIN 325 MG PO TABS: 325.0000 mg | ORAL_TABLET | Freq: Every day | ORAL | Status: DC

## 2011-10-31 NOTE — Progress Notes (Signed)
Physical Therapy Treatment Patient Details Name: Cory Roberts MRN: [id number] DOB: 03-12-1960 Today's Date: 10/31/2011 Time: 2595-6387 PT Time Calculation (min): 34 min  PT Assessment / Plan / Recommendation Comments on Treatment Session  Pt s/p brainstem CVA with most limiting factor being vertigo. Pt much less symptomatic today, however symptoms elecited with gaze stabilization exercises. Wife present throughout session and reports they are going to borrow a RW from a friend. She inquired re: a gait belt and recommended use of belt on stairs especially if there is not a second person to assist her. Both pt and wife agreeable to OPPT.    Follow Up Recommendations  Outpatient PT;Supervision/Assistance - 24 hour    Barriers to Discharge        Equipment Recommendations  None recommended by PT    Recommendations for Other Services    Frequency Min 4X/week   Plan Discharge plan remains appropriate;Frequency remains appropriate    Precautions / Restrictions Precautions Precautions: Fall Precaution Comments: compensatory strategies for vertigo   Pertinent Vitals/Pain No pain; reported he felt like his heart was racing; HR 88    Mobility  Bed Mobility Bed Mobility: Supine to Sit;Sitting - Scoot to Edge of Bed Supine to Sit: 6: Modified independent (Device/Increase time);HOB flat Sitting - Scoot to Edge of Bed: 6: Modified independent (Device/Increase time) Transfers Transfers: Sit to Stand;Stand to Sit Sit to Stand: 4: Min guard;With upper extremity assist;From bed Stand to Sit: 4: Min guard;With upper extremity assist;To bed Details for Transfer Assistance: vc for safe use of RW; no posterior loss of balance this date Ambulation/Gait Ambulation/Gait Assistance: 4: Min assist Ambulation Distance (Feet): 150 Feet Assistive device: Rolling walker Ambulation/Gait Assistance Details: Pt able to look straight ahead with minimal sense of environment moving; able to even perform head  turns Lt and Rt to read signs on opposite sides of hall without loss of balance;  Gait Pattern: Step-through pattern;Decreased stride length;Wide base of support Gait velocity: decr Stairs: Yes Stairs Assistance: 4: Min assist Stairs Assistance Details (indicate cue type and reason): Progressed pt from use of 2 rails to 1 rail to just HHA; pt denied sense of steps/environment moving; pt with loss of balance posteriorly x 1 as desecnding steps requinring assist to recover. Wife present throughout Stair Management Technique: No rails;One rail Right;Two rails;Step to pattern;Forwards;Other (comment) (rt HHA) Number of Stairs: 11  (3 and then 2 multiple attempts) Modified Rankin (Stroke Patients Only) Pre-Morbid Rankin Score: No symptoms Modified Rankin: Moderately severe disability    Exercises Other Exercises Other Exercises: See OT note; assisted OT with balance in sitting while she was in front of pt watching his eyes   PT Diagnosis:    PT Problem List:   PT Treatment Interventions:     PT Goals Acute Rehab PT Goals Pt will go Sit to Stand: with modified independence;with upper extremity assist PT Goal: Sit to Stand - Progress: Progressing toward goal Pt will go Stand to Sit: with modified independence;with upper extremity assist PT Goal: Stand to Sit - Progress: Progressing toward goal Pt will Ambulate: >150 feet;with supervision PT Goal: Ambulate - Progress: Progressing toward goal Pt will Go Up / Down Stairs: 3-5 stairs;with supervision PT Goal: Up/Down Stairs - Progress: Progressing toward goal  Visit Information  Last PT Received On: 10/31/11 Assistance Needed: +1    Subjective Data  Subjective: Reports "everything is tilting to the left" states things are not spinning/moving like yesterday. Did develop double-vision with x1 exercises with OT Patient  Stated Goal: return to work   Cognition  Overall Cognitive Status: Appears within functional limits for tasks  assessed/performed    Insurance risk surveyor Sitting - Balance Support: No upper extremity supported;Feet supported Static Sitting - Level of Assistance: 7: Independent Dynamic Sitting Balance Dynamic Sitting - Balance Support: No upper extremity supported;Feet supported Dynamic Sitting - Level of Assistance: 4: Min assist (with head turns/gaze stabilization exercises leans post) Static Standing Balance Static Standing - Balance Support: Bilateral upper extremity supported Static Standing - Level of Assistance: Other (comment) (minguard assist for safety)  End of Session PT - End of Session Equipment Utilized During Treatment: Gait belt Activity Tolerance: Patient tolerated treatment well Patient left: in bed;Other (comment);with family/visitor present (with OT)   GP     Addis Bennie 10/31/2011, 9:46 AM Pager [phone]

## 2011-10-31 NOTE — Progress Notes (Signed)
Occupational Therapy Treatment Patient Details Name: Cory Roberts MRN: [id number] DOB: 08-Jun-1960 Today's Date: 10/31/2011 Time: 4098-1191 OT Time Calculation (min): 29 min  OT Assessment / Plan / Recommendation Comments on Treatment Session This 51 yo male making progress since yesterday with his vertigo, but still has moments when it does kick back in. Will continue to benefit from acute OT wtih follow up at OPOT.    Follow Up Recommendations  Outpatient OT       Equipment Recommendations  3 in 1 bedside comode (wife says they can borrow one from someone)       Frequency Min 3X/week   Plan Discharge plan remains appropriate    Precautions / Restrictions Precautions Precautions: Fall (vertigo, better today than yesterday) Precaution Comments: compensatory strategies for vertigo Restrictions Weight Bearing Restrictions: No       ADL  Lower Body Dressing: Performed;Min guard Where Assessed - Lower Body Dressing: Unsupported sit to stand Toilet Transfer: Simulated;Min guard Toilet Transfer Method: Sit to Barista:  (from gym back to room to sit EOB)      OT Goals ADL Goals ADL Goal: Lower Body Dressing - Progress: Progressing toward goals ADL Goal: Toilet Transfer - Progress: Progressing toward goals Miscellaneous OT Goals Miscellaneous OT Goal #1: Pt's family will be independent in directing pt with times one exercises for gaze stablization prn. OT Goal: Miscellaneous Goal #1 - Progress: Goal set today  Visit Information  Last OT Received On: 10/31/11 Assistance Needed: +1 PT/OT Co-Evaluation/Treatment: Yes (partial)    Subjective Data  Subjective: I can go back to work ion a week      Cognition  Overall Cognitive Status: Impaired Area of Impairment: Safety/judgement Arousal/Alertness: Awake/alert Orientation Level: Oriented X4 / Intact Behavior During Session: WFL for tasks performed Safety/Judgement: Decreased awareness of need for  assistance Safety/Judgement - Other Comments: Thinks he will be fine in a week and can go back to work    Mobility   Bed Mobility Bed Mobility: Supine to Sit;Sitting - Scoot to Edge of Bed Supine to Sit: 6: Modified independent (Device/Increase time);HOB flat Sitting - Scoot to Edge of Bed: 6: Modified independent (Device/Increase time) Transfers Sit to Stand: 4: Min guard;With upper extremity assist;From bed Stand to Sit: 4: Min guard;With upper extremity assist;To bed Details for Transfer Assistance: vc for safe use of RW; no posterior loss of balance this date       Exercises  Other Exercises Other Exercises: Pt and wife educated on times one gaze stabilization exercises horizontal and vertical with pt holding a cup in front of him and only moving his head while his eyes stay fixed on the cup. Hand out given with pt to do these exercises at least 3 times a day in supported sittting for now due to patient tends to  start to lean posteriorly  the more he does it. On the first time with this he reported dizziness and double vison with horizontal head movments and the only way to get it to change was to have him close his eyes for about 10 seconds.    Balance Static Sitting Balance Static Sitting - Balance Support: No upper extremity supported;Feet unsupported Static Sitting - Level of Assistance: 7: Independent Dynamic Sitting Balance Dynamic Sitting - Balance Support: No upper extremity supported;Feet supported Dynamic Sitting - Level of Assistance: 4: Min assist (with head turns/gaze stabilization exercises leans posterior) Static Standing Balance Static Standing - Balance Support: Bilateral upper extremity supported Static Standing -  Level of Assistance: Other (comment) (minguard assist for safety)   End of Session OT - End of Session Activity Tolerance: Patient tolerated treatment well Patient left: in bed;with family/visitor present       Evette Georges  [phone] 10/31/2011, 11:57 AM

## 2011-10-31 NOTE — Progress Notes (Signed)
Pt education and teaching complete. Discharge and stroke education done. Financial counselor contacted at pt request. Pt given contact number number and waiting for counselor's call. Out pt therapy being set up through case mgmt.  Pt stable and ready for discharge home with wife.

## 2011-10-31 NOTE — Progress Notes (Signed)
Stroke team progress note  History: 51 year old male with raynaud's syndrome, arthritis, prior smoker, here with new onset dizziness. MRI shows subtle medulla, small vessel acute stroke on thin cut DWI views.  Subjective: Patient currently working with therapy. Reports taking up to 8 NSAIDS/day for headache.  Objective: Vital signs in last 24 hours: Filed Vitals:   10/30/11 1802 10/30/11 2054 10/31/11 0135 10/31/11 1030  BP: 128/76 136/78 118/64 128/67  Pulse: 71 69 68 80  Temp: 97.6 F (36.4 C) 97.6 F (36.4 C) 97.8 F (36.6 C) 97.5 F (36.4 C)  TempSrc: Oral Oral Oral Oral  Resp: 18 18 18 18   Height:      Weight:      SpO2: 99% 99% 96% 99%   GENERAL EXAM: Patient is in no distress  CARDIOVASCULAR: Regular rate and rhythm, no murmurs, no carotid bruits  NEUROLOGIC: MENTAL STATUS: awake, alert, language fluent, comprehension intact, naming intact CRANIAL NERVE: pupils equal and reactive to light, visual fields full to confrontation, extraocular muscles intact, no nystagmus, facial sensation and strength symmetric, uvula midline, shoulder shrug symmetric, tongue midline. MOTOR: normal bulk and tone, full strength in the BUE, BLE SENSORY: normal and symmetric to light touch COORDINATION: finger-nose-finger, fine finger movements normal REFLEXES: deep tendon reflexes present and symmetric  Lab Results:  Basename 10/31/11 0640 10/30/11 0520  WBC 6.8 6.3  HGB 13.8 13.7  HCT 40.6 39.4  PLT 203 211   BMET  Basename 10/31/11 0640 10/30/11 0520  NA 139 142  K 3.3* 3.1*  CL 103 107  CO2 29 26  GLUCOSE 106* 105*  BUN 19 22  CREATININE 0.95 0.96  CALCIUM 8.9 8.7   Lipid Panel     Component Value Date/Time   CHOL 123 10/29/2011 0515   TRIG 63 10/29/2011 0515   HDL 28* 10/29/2011 0515   CHOLHDL 4.4 10/29/2011 0515   VLDL 13 10/29/2011 0515   LDLCALC 82 10/29/2011 0515   Lab Results  Component Value Date   HGBA1C 5.9* 10/29/2011   ANA, ESR, homocysteine  normal  Studies/Results:  10/29/2011 CHEST - 2 VIEW  - No acute cardiopulmonary disease.     10/28/2011  CT HEAD - Negative exam.     10/28/2011   MRI HEAD  Small area of restricted diffusion centrally within the medulla concerning for acute brainstem infarct. No lobar hemorrhage or bleed observed.  Otherwise negative study. Findings discussed with ordering provider.     10/29/2011  MRA head Unremarkable MR angiography of the intracranial circulation. No focal narrowing or occlusion in the vertebrobasilar territory.  10/29/11 Carotid u/s - Preliminary report: Bilateral: No evidence of hemodynamically significant internal carotid artery stenosis. Vertebral artery flow is antegrade.  10/29/2011 TTE - EF 60-65% with no source of embolus.   CTA head Continued normal appearance of the brain by CT. Normal CT angiography of the intracranial vessels.  CTA neck Mild atherosclerotic disease of the aortic arch and of both carotid bifurcations, but without measurable stenosis or significant  irregularity. No posterior circulation pathology evident.  Medications:     . aspirin  300 mg Rectal Daily   Or  . aspirin  325 mg Oral Daily  . divalproex  500 mg Oral Daily  . potassium chloride  40 mEq Oral Once  . potassium chloride  60 mEq Oral Once  . simvastatin  20 mg Oral q1800  . DISCONTD: naproxen  375 mg Oral BID WC   Assessment/Plan: 51 year old male with raynaud's  syndrome, arthritis, prior smoker, here with new onset dizziness. MRI shows subtle medulla, small vessel acute stroke on thin cut DWI views. Infarct due to small vessel disease. CTA of head and neck unrevealing for dissection.  HgbA1c 5.9 LDL 82, goal LDL < 100 for non-diabetic stroke pts.  Chronic headaches.-likely analgesic rebound headaches- Takes 8 ibuprofen/day, which likely contribute to its worsening. HA improving today   PLAN: 1. Aspirin 325mg  daily 2. Hypercoagulable panel 3. outpatient PT 4. depakote ER daily for  headache prevention. Stop taking ibuprofen/naproxen. 5. Dr Pearlean Brownie will arrange an outpatient TCD bubble study with emboli monitoring with in 1 month to further evaluate for possibility of PFO.  6. Ok for discharge from stroke standpoint 7. Stroke Service will sign off.   Annie Main, MSN, RN, ANVP-BC, ANP-BC, [facility] Pager: [phone] 10/31/2011 10:47 AM  Scribe for Dr. Delia Heady, Stroke Center Medical Director, who has personally reviewed chart, pertinent data, examined the patient and developed the plan of care. Pager:  [phone]

## 2011-11-01 LAB — FACTOR 5 LEIDEN

## 2011-11-16 ENCOUNTER — Ambulatory Visit: Payer: BC Managed Care – PPO | Admitting: Occupational Therapy

## 2011-11-16 ENCOUNTER — Ambulatory Visit: Payer: BC Managed Care – PPO | Admitting: Rehabilitative and Restorative Service Providers"

## 2014-01-30 DIAGNOSIS — K279 Peptic ulcer, site unspecified, unspecified as acute or chronic, without hemorrhage or perforation: Secondary | ICD-10-CM

## 2014-01-30 HISTORY — DX: Peptic ulcer, site unspecified, unspecified as acute or chronic, without hemorrhage or perforation: K27.9

## 2014-07-14 ENCOUNTER — Other Ambulatory Visit (HOSPITAL_COMMUNITY): Payer: Self-pay | Admitting: Family Medicine

## 2014-07-14 ENCOUNTER — Ambulatory Visit (HOSPITAL_COMMUNITY)
Admission: RE | Admit: 2014-07-14 | Discharge: 2014-07-14 | Disposition: A | Discharge status: Home / Self Care | Payer: BLUE CROSS/BLUE SHIELD | Source: Ambulatory Visit | Attending: Family Medicine | Admitting: Family Medicine

## 2014-07-14 DIAGNOSIS — M25462 Effusion, left knee: Secondary | ICD-10-CM | POA: Insufficient documentation

## 2014-07-14 DIAGNOSIS — M25562 Pain in left knee: Secondary | ICD-10-CM

## 2014-09-21 ENCOUNTER — Other Ambulatory Visit (HOSPITAL_COMMUNITY): Payer: Self-pay | Admitting: Physician Assistant

## 2014-09-21 ENCOUNTER — Ambulatory Visit (HOSPITAL_COMMUNITY)
Admission: RE | Admit: 2014-09-21 | Discharge: 2014-09-21 | Disposition: A | Discharge status: Home / Self Care | Payer: BLUE CROSS/BLUE SHIELD | Source: Ambulatory Visit | Attending: Physician Assistant | Admitting: Physician Assistant

## 2014-09-21 DIAGNOSIS — M25561 Pain in right knee: Secondary | ICD-10-CM

## 2014-09-21 DIAGNOSIS — M25551 Pain in right hip: Secondary | ICD-10-CM

## 2014-11-27 ENCOUNTER — Ambulatory Visit: Payer: Self-pay | Admitting: Orthopedic Surgery

## 2014-12-01 NOTE — Patient Instructions (Signed)
YOUR PROCEDURE IS SCHEDULED ON :  12/10/14  REPORT TO [facility] MAIN ENTRANCE FOLLOW SIGNS TO EAST ELEVATOR - GO TO 3rd FLOOR CHECK IN AT 3 EAST NURSES STATION (SHORT STAY) AT:  1:45 PM  CALL THIS NUMBER IF YOU HAVE PROBLEMS THE MORNING OF SURGERY [phone]  REMEMBER:ONLY 1 PER PERSON MAY GO TO SHORT STAY WITH YOU TO GET READY THE MORNING OF YOUR SURGERY  DO NOT EAT FOOD  AFTER MIDNIGHT  MAY HAVE CLEAR LIQUIDS UNTIL 9:45 AM  TAKE THESE MEDICINES THE MORNING OF SURGERY:  CLEAR LIQUID DIET  Foods Allowed                                                                     Foods Excluded  Coffee and tea, regular and decaf                             liquids that you cannot  Plain Jell-O in any flavor                                             see through such as: Fruit ices (not with fruit pulp)                                     milk, soups, orange juice  Iced Popsicles                                                 All solid food Carbonated beverages, regular and diet                                    Cranberry, grape and apple juices Sports drinks like Gatorade Lightly seasoned clear broth or consume(fat free) Sugar, honey syrup   _____________________________________________________________________    YOU MAY NOT HAVE ANY METAL ON YOUR BODY INCLUDING HAIR PINS AND PIERCING'S. DO NOT WEAR JEWELRY, MAKEUP, LOTIONS, POWDERS OR PERFUMES. DO NOT WEAR NAIL POLISH. DO NOT SHAVE 48 HRS PRIOR TO SURGERY. MEN MAY SHAVE FACE AND NECK.  DO NOT BRING VALUABLES TO HOSPITAL. [facility] IS NOT RESPONSIBLE FOR VALUABLES.  CONTACTS, DENTURES OR PARTIALS MAY NOT BE WORN TO SURGERY. LEAVE SUITCASE IN CAR. CAN BE BROUGHT TO ROOM AFTER SURGERY.  PATIENTS DISCHARGED THE DAY OF SURGERY WILL NOT BE ALLOWED TO DRIVE HOME.  PLEASE READ OVER THE FOLLOWING INSTRUCTION SHEETS _________________________________________________________________________________                                - PREPARING FOR SURGERY  Before surgery, you can play an important role.  Because skin is not sterile, your skin needs to be as free of germs as possible.  You can  reduce the number of germs on your skin by washing with CHG (chlorahexidine gluconate) soap before surgery.  CHG is an antiseptic cleaner which kills germs and bonds with the skin to continue killing germs even after washing. Please DO NOT use if you have an allergy to CHG or antibacterial soaps.  If your skin becomes reddened/irritated stop using the CHG and inform your nurse when you arrive at Short Stay. Do not shave (including legs and underarms) for at least 48 hours prior to the first CHG shower.  You may shave your face. Please follow these instructions carefully:   1.  Shower with CHG Soap the night before surgery and the  morning of Surgery.   2.  If you choose to wash your hair, wash your hair first as usual with your  normal  Shampoo.   3.  After you shampoo, rinse your hair and body thoroughly to remove the  shampoo.                                         4.  Use CHG as you would any other liquid soap.  You can apply chg directly  to the skin and wash . Gently wash with scrungie or clean wascloth    5.  Apply the CHG Soap to your body ONLY FROM THE NECK DOWN.   Do not use on open                           Wound or open sores. Avoid contact with eyes, ears mouth and genitals (private parts).                        Genitals (private parts) with your normal soap.              6.  Wash thoroughly, paying special attention to the area where your surgery  will be performed.   7.  Thoroughly rinse your body with warm water from the neck down.   8.  DO NOT shower/wash with your normal soap after using and rinsing off  the CHG Soap .                9.  Pat yourself dry with a clean towel.             10.  Wear clean night clothes to bed after shower             11.  Place clean  sheets on your bed the night of your first shower and do not  sleep with pets.  Day of Surgery : Do not apply any lotions/deodorants the morning of surgery.  Please wear clean clothes to the hospital/surgery center.  FAILURE TO FOLLOW THESE INSTRUCTIONS MAY RESULT IN THE CANCELLATION OF YOUR SURGERY    PATIENT SIGNATURE_________________________________  ______________________________________________________________________     Adam Phenix  An incentive spirometer is a tool that can help keep your lungs clear and active. This tool measures how well you are filling your lungs with each breath. Taking long deep breaths may help reverse or decrease the chance of developing breathing (pulmonary) problems (especially infection) following:  A long period of time when you are unable to move or be active. BEFORE THE PROCEDURE   If the spirometer includes an indicator to show your best effort,  your nurse or respiratory therapist will set it to a desired goal.  If possible, sit up straight or lean slightly forward. Try not to slouch.  Hold the incentive spirometer in an upright position. INSTRUCTIONS FOR USE   Sit on the edge of your bed if possible, or sit up as far as you can in bed or on a chair.  Hold the incentive spirometer in an upright position.  Breathe out normally.  Place the mouthpiece in your mouth and seal your lips tightly around it.  Breathe in slowly and as deeply as possible, raising the piston or the ball toward the top of the column.  Hold your breath for 3-5 seconds or for as long as possible. Allow the piston or ball to fall to the bottom of the column.  Remove the mouthpiece from your mouth and breathe out normally.  Rest for a few seconds and repeat Steps 1 through 7 at least 10 times every 1-2 hours when you are awake. Take your time and take a few normal breaths between deep breaths.  The spirometer may include an indicator to show your best  effort. Use the indicator as a goal to work toward during each repetition.  After each set of 10 deep breaths, practice coughing to be sure your lungs are clear. If you have an incision (the cut made at the time of surgery), support your incision when coughing by placing a pillow or rolled up towels firmly against it. Once you are able to get out of bed, walk around indoors and cough well. You may stop using the incentive spirometer when instructed by your caregiver.  RISKS AND COMPLICATIONS  Take your time so you do not get dizzy or light-headed.  If you are in pain, you may need to take or ask for pain medication before doing incentive spirometry. It is harder to take a deep breath if you are having pain. AFTER USE  Rest and breathe slowly and easily.  It can be helpful to keep track of a log of your progress. Your caregiver can provide you with a simple table to help with this. If you are using the spirometer at home, follow these instructions: [place] IF:   You are having difficultly using the spirometer.  You have trouble using the spirometer as often as instructed.  Your pain medication is not giving enough relief while using the spirometer.  You develop fever of 100.5 F (38.1 C) or higher. SEEK IMMEDIATE MEDICAL CARE IF:   You cough up bloody sputum that had not been present before.  You develop fever of 102 F (38.9 C) or greater.  You develop worsening pain at or near the incision site. MAKE SURE YOU:   Understand these instructions.  Will watch your condition.  Will get help right away if you are not doing well or get worse. Document Released: 05/29/2006 Document Revised: 04/10/2011 Document Reviewed: 07/30/2006 Exit[employer] Patient Information 2014 [employer], [place].   ________________________________________________________________________  WHAT IS A BLOOD TRANSFUSION? Blood Transfusion Information  A transfusion is the replacement of blood or some of  its parts. Blood is made up of multiple cells which provide different functions.  Red blood cells carry oxygen and are used for blood loss replacement.  White blood cells fight against infection.  Platelets control bleeding.  Plasma helps clot blood.  Other blood products are available for specialized needs, such as hemophilia or other clotting disorders. BEFORE THE TRANSFUSION  Who gives blood for transfusions?  Healthy volunteers who are fully evaluated to make sure their blood is safe. This is blood bank blood. Transfusion therapy is the safest it has ever been in the practice of medicine. Before blood is taken from a donor, a complete history is taken to make sure that person has no history of diseases nor engages in risky social behavior (examples are intravenous drug use or sexual activity with multiple partners). The donor's travel history is screened to minimize risk of transmitting infections, such as malaria. The donated blood is tested for signs of infectious diseases, such as HIV and hepatitis. The blood is then tested to be sure it is compatible with you in order to minimize the chance of a transfusion reaction. If you or a relative donates blood, this is often done in anticipation of surgery and is not appropriate for emergency situations. It takes many days to process the donated blood. RISKS AND COMPLICATIONS Although transfusion therapy is very safe and saves many lives, the main dangers of transfusion include:   Getting an infectious disease.  Developing a transfusion reaction. This is an allergic reaction to something in the blood you were given. Every precaution is taken to prevent this. The decision to have a blood transfusion has been considered carefully by your caregiver before blood is given. Blood is not given unless the benefits outweigh the risks. AFTER THE TRANSFUSION  Right after receiving a blood transfusion, you will usually feel much better and more  energetic. This is especially true if your red blood cells have gotten low (anemic). The transfusion raises the level of the red blood cells which carry oxygen, and this usually causes an energy increase.  The nurse administering the transfusion will monitor you carefully for complications. HOME CARE INSTRUCTIONS  No special instructions are needed after a transfusion. You may find your energy is better. Speak with your caregiver about any limitations on activity for underlying diseases you may have. SEEK MEDICAL CARE IF:   Your condition is not improving after your transfusion.  You develop redness or irritation at the intravenous (IV) site. SEEK IMMEDIATE MEDICAL CARE IF:  Any of the following symptoms occur over the next 12 hours:  Shaking chills.  You have a temperature by mouth above 102 F (38.9 C), not controlled by medicine.  Chest, back, or muscle pain.  People around you feel you are not acting correctly or are confused.  Shortness of breath or difficulty breathing.  Dizziness and fainting.  You get a rash or develop hives.  You have a decrease in urine output.  Your urine turns a dark color or changes to pink, red, or brown. Any of the following symptoms occur over the next 10 days:  You have a temperature by mouth above 102 F (38.9 C), not controlled by medicine.  Shortness of breath.  Weakness after normal activity.  The white part of the eye turns yellow (jaundice).  You have a decrease in the amount of urine or are urinating less often.  Your urine turns a dark color or changes to pink, red, or brown. Document Released: 01/14/2000 Document Revised: 04/10/2011 Document Reviewed: 09/02/2007 [facility] Patient Information 2014 [place], [place].  _______________________________________________________________________

## 2014-12-03 ENCOUNTER — Inpatient Hospital Stay (HOSPITAL_COMMUNITY)
Admission: RE | Admit: 2014-12-03 | Discharge: 2014-12-03 | Disposition: A | Discharge status: Home / Self Care | Payer: BLUE CROSS/BLUE SHIELD | Source: Ambulatory Visit

## 2014-12-10 ENCOUNTER — Encounter (HOSPITAL_COMMUNITY): Admission: RE | Payer: Self-pay | Source: Ambulatory Visit

## 2014-12-10 ENCOUNTER — Inpatient Hospital Stay (HOSPITAL_COMMUNITY)
Admission: RE | Admit: 2014-12-10 | Payer: BLUE CROSS/BLUE SHIELD | Source: Ambulatory Visit | Admitting: Orthopedic Surgery

## 2014-12-10 SURGERY — ARTHROPLASTY, HIP, TOTAL, ANTERIOR APPROACH
Anesthesia: Spinal | Site: Hip | Laterality: Right

## 2014-12-13 ENCOUNTER — Encounter (HOSPITAL_COMMUNITY): Payer: Self-pay | Admitting: *Deleted

## 2014-12-13 ENCOUNTER — Emergency Department (HOSPITAL_COMMUNITY)
Admission: EM | Admit: 2014-12-13 | Discharge: 2014-12-13 | Disposition: A | Discharge status: Home / Self Care | Payer: BLUE CROSS/BLUE SHIELD | Source: Home / Self Care | Attending: Emergency Medicine | Admitting: Emergency Medicine

## 2014-12-13 DIAGNOSIS — M109 Gout, unspecified: Secondary | ICD-10-CM

## 2014-12-13 DIAGNOSIS — Z87891 Personal history of nicotine dependence: Secondary | ICD-10-CM | POA: Insufficient documentation

## 2014-12-13 DIAGNOSIS — M1990 Unspecified osteoarthritis, unspecified site: Secondary | ICD-10-CM

## 2014-12-13 DIAGNOSIS — Z7982 Long term (current) use of aspirin: Secondary | ICD-10-CM | POA: Insufficient documentation

## 2014-12-13 DIAGNOSIS — L03113 Cellulitis of right upper limb: Secondary | ICD-10-CM

## 2014-12-13 DIAGNOSIS — L02511 Cutaneous abscess of right hand: Secondary | ICD-10-CM | POA: Diagnosis not present

## 2014-12-13 DIAGNOSIS — Z8679 Personal history of other diseases of the circulatory system: Secondary | ICD-10-CM

## 2014-12-13 MED ORDER — LIDOCAINE HCL (PF) 1 % IJ SOLN
2.0000 mL | Freq: Once | INTRAMUSCULAR | Status: AC
  Administered 2014-12-13: 2 mL
  Filled 2014-12-13: qty 5

## 2014-12-13 MED ORDER — CEFTRIAXONE SODIUM 1 G IJ SOLR
1.0000 g | Freq: Once | INTRAMUSCULAR | Status: AC
  Administered 2014-12-13: 1 g via INTRAMUSCULAR
  Filled 2014-12-13: qty 10

## 2014-12-13 MED ORDER — CEPHALEXIN 500 MG PO CAPS: 500.0000 mg | ORAL_CAPSULE | Freq: Four times a day (QID) | ORAL | Status: DC

## 2014-12-13 MED ORDER — SULFAMETHOXAZOLE-TRIMETHOPRIM 800-160 MG PO TABS: 1.0000 | ORAL_TABLET | Freq: Two times a day (BID) | ORAL | Status: DC

## 2014-12-13 MED ORDER — IBUPROFEN 800 MG PO TABS: 800.0000 mg | ORAL_TABLET | Freq: Three times a day (TID) | ORAL | Status: DC

## 2014-12-13 NOTE — ED Notes (Signed)
Pt has right swollen hand right with thumb blistered and discolored. Pt denies injury, questions if he got bit.

## 2014-12-13 NOTE — ED Notes (Signed)
Pt made aware to return if symptoms worsen or if any life threatening symptoms occur.   

## 2014-12-13 NOTE — ED Provider Notes (Signed)
CSN: [id number]     Arrival date & time 12/13/14  1149 History  By signing my name below, I, Ronney Lion, attest that this documentation has been prepared under the direction and in the presence of Eber Hong, MD. Electronically Signed: Ronney Lion, ED Scribe. 12/13/2014. 1:17 PM.     Chief Complaint  Patient presents with  . Cellulitis   HPI  HPI Comments: Cory Roberts is a 54 y.o. male who presents to the Emergency Department complaining of right hand swelling that he first noticed about 2 days ago. He reports associated clear-yellow drainage from the area that was "muddy-brown" in color yesterday. He denies any known injuries. Patient states he takes a daily aspirin but otherwise denies being on any anticoagulation. He denies a history of DM, HTN, or any other chronic medical conditions. 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 He denies working with high pressures. He denies noticing any glove breakage or damage.  Sx are constant, worsening and associated with feeling cold but no measured fevers.  Worse with ROM of the hand / thumb.   Patient  He denies abdominal pain, extremity problems, any other skin problems, or any other symptoms. Right thumb  PCP: [facility]  Past Medical History  Diagnosis Date  . Raynaud's syndrome   . Arthritis Feb. 2013    Gout- Right foot  . Gout    Past Surgical History  Procedure Laterality Date  . Hernia repair  2007   Family History  Problem Relation Age of Onset  . Cancer Mother   . Hyperlipidemia Mother   . Hypertension Mother   . Cancer Father   . Heart disease Father 65    Heart Disease before age 24  . Heart attack Father   . Diabetes Sister   . Cancer Sister   . Hyperlipidemia Sister   . Hypertension Sister   . Heart attack Mother   . Rheumatologic disease Mother    Social History  Substance Use Topics  . Smoking status: Former Smoker    Types: Cigarettes    Quit date:  01/30/2005  . Smokeless tobacco: None  . Alcohol Use: No    Review of Systems  Constitutional: Negative for fever.  Musculoskeletal: Positive for joint swelling.  Skin: Positive for color change and rash.      Allergies  Codeine and Hydrocodone  Home Medications   Prior to Admission medications   Medication Sig Start Date End Date Taking? Authorizing Provider  aspirin 325 MG tablet Take 1 tablet (325 mg total) by mouth daily. 10/31/11   Renae Fickle, MD  cephALEXin (KEFLEX) 500 MG capsule Take 1 capsule (500 mg total) by mouth 4 (four) times daily. 12/13/14   Eber Hong, MD  divalproex (DEPAKOTE ER) 500 MG 24 hr tablet Take 1 tablet (500 mg total) by mouth daily. Patient not taking: Reported on 11/26/2014 10/31/11   Renae Fickle, MD  ibuprofen (ADVIL,MOTRIN) 800 MG tablet Take 1 tablet (800 mg total) by mouth 3 (three) times daily. 12/13/14   Eber Hong, MD  simvastatin (ZOCOR) 20 MG tablet Take 1 tablet (20 mg total) by mouth daily at 6 PM. Patient not taking: Reported on 11/26/2014 10/31/11   Renae Fickle, MD  sulfamethoxazole-trimethoprim (BACTRIM DS,SEPTRA DS) 800-160 MG tablet Take 1 tablet by mouth 2 (two) times daily. 12/13/14 12/20/14  Eber Hong, MD   BP 138/84 mmHg  Pulse 85  Temp(Src) 98.4 F (36.9 C) (Oral)  Resp 16  Ht 5\' 8"  (1.727 m)  Wt 174 lb (78.926 kg)  BMI 26.46 kg/m2  SpO2 99% Physical Exam  Constitutional: He appears well-developed and well-nourished.  HENT:  Head: Normocephalic and atraumatic.  Eyes: Conjunctivae are normal. Right eye exhibits no discharge. Left eye exhibits no discharge.  Pulmonary/Chest: Effort normal. No respiratory distress.  Musculoskeletal: Normal range of motion. He exhibits tenderness.  No pain with dorsiflexion or plantarflexion of wrist. Yellow serous drainage from the dorsum of the right thumb. Diffuse mild swelling of the right hand. Flexion and extension of all fingers is normal. Thumb is reduced secondary to  swelling.  No redness or swelling above the wrist  Neurological: He is alert. Coordination normal.  Normal grip - normal sensation of all finger  Skin: Skin is warm and dry. No rash noted. He is not diaphoretic. No erythema.  Color changes as noted above  Psychiatric: He has a normal mood and affect.  Nursing note and vitals reviewed.   ED Course  Procedures (including critical care time)  DIAGNOSTIC STUDIES: Oxygen Saturation is normal% on room ai, normal by my interpretation.    COORDINATION OF CARE: 1:17 PM - Discussed treatment plan with pt at bedside which includes antibiotics and f/u with PCP in 48 hours. Will give work note so pt does not use his hand. Pt verbalized understanding and agreed to plan.   Labs Review Labs Reviewed - No data to display  Imaging Review No results found. I have personally reviewed and evaluated these images and lab results as part of my medical decision-making.    MDM   Final diagnoses:  Cellulitis of hand, right    I personally performed the services described in this documentation, which was scribed in my presence. The recorded information has been reviewed and is accurate.    The patient is evidence of cellulitis of the dorsum of the right hand, there is mild diffuse swelling, he has a draining wound, it is draining serous fluid, due to the color change and extension of the swelling I would suggest he has a cellulitis. He has no systemic symptoms, no tachycardia, no fever, no hypotension. He will be treated with medications as below, I discussed these findings with him, he appears stable for discharge. He has agreed to a 48 hour follow-up with his family doctor.  Meds given in ED:  Medications  cefTRIAXone (ROCEPHIN) injection 1 g (1 g Intramuscular Given 12/13/14 1220)  lidocaine (PF) (XYLOCAINE) 1 % injection 2 mL (2 mLs Other Given 12/13/14 1221)    Discharge Medication List as of 12/13/2014 12:24 PM    START taking these  medications   Details  cephALEXin (KEFLEX) 500 MG capsule Take 1 capsule (500 mg total) by mouth 4 (four) times daily., Starting 12/13/2014, Until Discontinued, Print    sulfamethoxazole-trimethoprim (BACTRIM DS,SEPTRA DS) 800-160 MG tablet Take 1 tablet by mouth 2 (two) times daily., Starting 12/13/2014, Until Sun 12/20/14, Print             Eber HongBrian Aliyanah Rozas, MD 12/13/14 [phone]

## 2014-12-13 NOTE — ED Notes (Signed)
MD at bedside. 

## 2014-12-15 ENCOUNTER — Inpatient Hospital Stay (HOSPITAL_COMMUNITY)
Admission: EM | Admit: 2014-12-15 | Discharge: 2014-12-23 | DRG: 570 | Disposition: A | Discharge status: Home Health | Payer: BLUE CROSS/BLUE SHIELD | Attending: Family Medicine | Admitting: Family Medicine

## 2014-12-15 ENCOUNTER — Encounter (HOSPITAL_COMMUNITY): Payer: Self-pay | Admitting: Emergency Medicine

## 2014-12-15 DIAGNOSIS — Q394 Esophageal web: Secondary | ICD-10-CM

## 2014-12-15 DIAGNOSIS — E785 Hyperlipidemia, unspecified: Secondary | ICD-10-CM | POA: Diagnosis not present

## 2014-12-15 DIAGNOSIS — K2980 Duodenitis without bleeding: Secondary | ICD-10-CM | POA: Diagnosis present

## 2014-12-15 DIAGNOSIS — Z87891 Personal history of nicotine dependence: Secondary | ICD-10-CM | POA: Diagnosis not present

## 2014-12-15 DIAGNOSIS — I7300 Raynaud's syndrome without gangrene: Secondary | ICD-10-CM | POA: Diagnosis present

## 2014-12-15 DIAGNOSIS — Z6827 Body mass index (BMI) 27.0-27.9, adult: Secondary | ICD-10-CM | POA: Diagnosis not present

## 2014-12-15 DIAGNOSIS — E876 Hypokalemia: Secondary | ICD-10-CM | POA: Diagnosis present

## 2014-12-15 DIAGNOSIS — Z01810 Encounter for preprocedural cardiovascular examination: Secondary | ICD-10-CM

## 2014-12-15 DIAGNOSIS — Z9189 Other specified personal risk factors, not elsewhere classified: Secondary | ICD-10-CM

## 2014-12-15 DIAGNOSIS — B9562 Methicillin resistant Staphylococcus aureus infection as the cause of diseases classified elsewhere: Secondary | ICD-10-CM | POA: Diagnosis present

## 2014-12-15 DIAGNOSIS — K259 Gastric ulcer, unspecified as acute or chronic, without hemorrhage or perforation: Secondary | ICD-10-CM | POA: Diagnosis present

## 2014-12-15 DIAGNOSIS — L03113 Cellulitis of right upper limb: Secondary | ICD-10-CM | POA: Diagnosis present

## 2014-12-15 DIAGNOSIS — K210 Gastro-esophageal reflux disease with esophagitis: Secondary | ICD-10-CM | POA: Diagnosis present

## 2014-12-15 DIAGNOSIS — R1310 Dysphagia, unspecified: Secondary | ICD-10-CM | POA: Insufficient documentation

## 2014-12-15 DIAGNOSIS — M109 Gout, unspecified: Secondary | ICD-10-CM | POA: Diagnosis present

## 2014-12-15 DIAGNOSIS — K279 Peptic ulcer, site unspecified, unspecified as acute or chronic, without hemorrhage or perforation: Secondary | ICD-10-CM | POA: Insufficient documentation

## 2014-12-15 DIAGNOSIS — K2210 Ulcer of esophagus without bleeding: Secondary | ICD-10-CM | POA: Diagnosis present

## 2014-12-15 DIAGNOSIS — L02519 Cutaneous abscess of unspecified hand: Secondary | ICD-10-CM | POA: Diagnosis present

## 2014-12-15 DIAGNOSIS — L02511 Cutaneous abscess of right hand: Secondary | ICD-10-CM | POA: Diagnosis present

## 2014-12-15 DIAGNOSIS — L0390 Cellulitis, unspecified: Secondary | ICD-10-CM

## 2014-12-15 DIAGNOSIS — K269 Duodenal ulcer, unspecified as acute or chronic, without hemorrhage or perforation: Secondary | ICD-10-CM | POA: Diagnosis present

## 2014-12-15 DIAGNOSIS — Z7982 Long term (current) use of aspirin: Secondary | ICD-10-CM | POA: Diagnosis not present

## 2014-12-15 DIAGNOSIS — K449 Diaphragmatic hernia without obstruction or gangrene: Secondary | ICD-10-CM | POA: Diagnosis present

## 2014-12-15 DIAGNOSIS — L0291 Cutaneous abscess, unspecified: Secondary | ICD-10-CM | POA: Diagnosis not present

## 2014-12-15 DIAGNOSIS — B999 Unspecified infectious disease: Secondary | ICD-10-CM

## 2014-12-15 DIAGNOSIS — E669 Obesity, unspecified: Secondary | ICD-10-CM | POA: Diagnosis present

## 2014-12-15 DIAGNOSIS — Z8673 Personal history of transient ischemic attack (TIA), and cerebral infarction without residual deficits: Secondary | ICD-10-CM | POA: Diagnosis not present

## 2014-12-15 HISTORY — DX: Cerebral infarction, unspecified: I63.9

## 2014-12-15 HISTORY — DX: Gastro-esophageal reflux disease without esophagitis: K21.9

## 2014-12-15 HISTORY — DX: Headache, unspecified: R51.9

## 2014-12-15 HISTORY — DX: Headache: R51

## 2014-12-15 LAB — BASIC METABOLIC PANEL
Anion gap: 11 (ref 5–15)
BUN: 21 mg/dL — ABNORMAL HIGH (ref 6–20)
CALCIUM: 9.2 mg/dL (ref 8.9–10.3)
CO2: 25 mmol/L (ref 22–32)
Chloride: 101 mmol/L (ref 101–111)
Creatinine, Ser: 1.07 mg/dL (ref 0.61–1.24)
GFR calc Af Amer: >60 mL/min (ref >60)
GLUCOSE: 97 mg/dL (ref 65–99)
Potassium: 3.4 mmol/L — ABNORMAL LOW (ref 3.5–5.1)
SODIUM: 137 mmol/L (ref 135–145)

## 2014-12-15 LAB — CBC WITH DIFFERENTIAL/PLATELET
BASOS PCT: 0 %
Basophils Absolute: 0 10*3/uL (ref 0.0–0.1)
EOS ABS: 0.1 10*3/uL (ref 0.0–0.7)
EOS PCT: 1 %
HCT: 39.9 % (ref 39.0–52.0)
Hemoglobin: 13.9 g/dL (ref 13.0–17.0)
LYMPHS ABS: 1.5 10*3/uL (ref 0.7–4.0)
Lymphocytes Relative: 13 %
MCH: 33.8 pg (ref 26.0–34.0)
MCHC: 34.8 g/dL (ref 30.0–36.0)
MCV: 97.1 fL (ref 78.0–100.0)
MONOS PCT: 9 %
Monocytes Absolute: 1.2 10*3/uL — ABNORMAL HIGH (ref 0.1–1.0)
Neutro Abs: 9.4 10*3/uL — ABNORMAL HIGH (ref 1.7–7.7)
Neutrophils Relative %: 77 %
PLATELETS: 244 10*3/uL (ref 150–400)
RBC: 4.11 MIL/uL — AB (ref 4.22–5.81)
RDW: 11.7 % (ref 11.5–15.5)
WBC: 12.2 10*3/uL — AB (ref 4.0–10.5)

## 2014-12-15 MED ORDER — SODIUM CHLORIDE 0.9 % IV SOLN
INTRAVENOUS | Status: DC
  Administered 2014-12-15 – 2014-12-17 (×3): via INTRAVENOUS
  Administered 2014-12-17: 1000 mL via INTRAVENOUS

## 2014-12-15 MED ORDER — POTASSIUM CHLORIDE CRYS ER 20 MEQ PO TBCR
40.0000 meq | EXTENDED_RELEASE_TABLET | Freq: Every day | ORAL | Status: DC
  Administered 2014-12-15 – 2014-12-23 (×7): 40 meq via ORAL
  Filled 2014-12-15 (×6): qty 2

## 2014-12-15 MED ORDER — VANCOMYCIN HCL IN DEXTROSE 1-5 GM/200ML-% IV SOLN
1000.0000 mg | Freq: Two times a day (BID) | INTRAVENOUS | Status: DC
  Administered 2014-12-16 – 2014-12-23 (×13): 1000 mg via INTRAVENOUS
  Filled 2014-12-15 (×19): qty 200

## 2014-12-15 MED ORDER — ASPIRIN 325 MG PO TABS
325.0000 mg | ORAL_TABLET | Freq: Every day | ORAL | Status: DC
  Administered 2014-12-16 – 2014-12-23 (×5): 325 mg via ORAL
  Filled 2014-12-15 (×5): qty 1

## 2014-12-15 MED ORDER — LIDOCAINE HCL (PF) 1 % IJ SOLN
5.0000 mL | Freq: Once | INTRAMUSCULAR | Status: AC
  Administered 2014-12-15: 5 mL
  Filled 2014-12-15: qty 5

## 2014-12-15 MED ORDER — MORPHINE SULFATE (PF) 4 MG/ML IV SOLN
4.0000 mg | Freq: Once | INTRAVENOUS | Status: AC
Start: 2014-12-15 — End: 2014-12-15
  Administered 2014-12-15: 4 mg via INTRAVENOUS
  Filled 2014-12-15: qty 1

## 2014-12-15 MED ORDER — ENOXAPARIN SODIUM 40 MG/0.4ML ~~LOC~~ SOLN
40.0000 mg | SUBCUTANEOUS | Status: DC
  Administered 2014-12-16: 40 mg via SUBCUTANEOUS
  Filled 2014-12-15: qty 0.4

## 2014-12-15 MED ORDER — ONDANSETRON HCL 4 MG/2ML IJ SOLN
4.0000 mg | Freq: Four times a day (QID) | INTRAMUSCULAR | Status: DC | PRN
  Administered 2014-12-16: 4 mg via INTRAVENOUS
  Filled 2014-12-15 (×2): qty 2

## 2014-12-15 MED ORDER — ACETAMINOPHEN 325 MG PO TABS
650.0000 mg | ORAL_TABLET | Freq: Four times a day (QID) | ORAL | Status: DC | PRN
  Administered 2014-12-16: 650 mg via ORAL
  Filled 2014-12-15: qty 2

## 2014-12-15 MED ORDER — VANCOMYCIN HCL IN DEXTROSE 1-5 GM/200ML-% IV SOLN
1000.0000 mg | Freq: Once | INTRAVENOUS | Status: AC
  Administered 2014-12-15: 1000 mg via INTRAVENOUS
  Filled 2014-12-15: qty 200

## 2014-12-15 MED ORDER — MORPHINE SULFATE (PF) 2 MG/ML IV SOLN
2.0000 mg | INTRAVENOUS | Status: DC | PRN
  Administered 2014-12-16 – 2014-12-19 (×4): 2 mg via INTRAVENOUS
  Filled 2014-12-15 (×4): qty 1

## 2014-12-15 MED ORDER — ACETAMINOPHEN 650 MG RE SUPP: 650.0000 mg | Freq: Four times a day (QID) | RECTAL | Status: DC | PRN

## 2014-12-15 MED ORDER — ONDANSETRON HCL 4 MG PO TABS: 4.0000 mg | ORAL_TABLET | Freq: Four times a day (QID) | ORAL | Status: DC | PRN

## 2014-12-15 NOTE — ED Notes (Signed)
Pt reports has been on po antibiotics for infection in r thumb.  Infection worse, thumb red, swollen, draining.  Red streaks noted in r forearm.

## 2014-12-15 NOTE — ED Provider Notes (Signed)
CSN: [id number]     Arrival date & time 12/15/14  1616 History   First MD Initiated Contact with Patient 12/15/14 1919     Chief Complaint  Patient presents with  . Wound Infection     (Consider location/radiation/quality/duration/timing/severity/associated sxs/prior Treatment) The history is provided by the patient.   patient presents with an infection on his right thumb. Was seen 4 days ago for the same in the ER. Started on Keflex and Bactrim. Did not appear to be drainable at that time. Follow-up his primary care doctor who sent him in to the ER. States the pain is still there in the thumb. States the swelling is decreased little bit but there is now red marks going up his forearm. States this was not there before. No fevers. He states he has been taking the medicine.  Past Medical History  Diagnosis Date  . Raynaud's syndrome   . Arthritis Feb. 2013    Gout- Right foot  . Gout    Past Surgical History  Procedure Laterality Date  . Hernia repair  2007   Family History  Problem Relation Age of Onset  . Cancer Mother   . Hyperlipidemia Mother   . Hypertension Mother   . Cancer Father   . Heart disease Father 60    Heart Disease before age 33  . Heart attack Father   . Diabetes Sister   . Cancer Sister   . Hyperlipidemia Sister   . Hypertension Sister   . Heart attack Mother   . Rheumatologic disease Mother    Social History  Substance Use Topics  . Smoking status: Former Smoker    Types: Cigarettes    Quit date: 01/30/2005  . Smokeless tobacco: None  . Alcohol Use: No    Review of Systems  Constitutional: Positive for appetite change. Negative for activity change.  Respiratory: Negative for shortness of breath.   Gastrointestinal: Negative for abdominal pain.  Musculoskeletal: Negative for back pain.  Skin: Positive for color change and wound.  Hematological: Negative for adenopathy.      Allergies  Codeine and Hydrocodone  Home Medications   Prior  to Admission medications   Medication Sig Start Date End Date Taking? Authorizing Provider  aspirin 325 MG tablet Take 1 tablet (325 mg total) by mouth daily. 10/31/11  Yes Renae Fickle, MD  cephALEXin (KEFLEX) 500 MG capsule Take 1 capsule (500 mg total) by mouth 4 (four) times daily. 12/13/14  Yes Eber Hong, MD  ibuprofen (ADVIL,MOTRIN) 800 MG tablet Take 1 tablet (800 mg total) by mouth 3 (three) times daily. 12/13/14  Yes Eber Hong, MD  sulfamethoxazole-trimethoprim (BACTRIM DS,SEPTRA DS) 800-160 MG tablet Take 1 tablet by mouth 2 (two) times daily. 12/13/14 12/20/14 Yes Eber Hong, MD   BP 115/93 mmHg  Pulse 84  Temp(Src) 98.1 F (36.7 C) (Oral)  Resp 16  Ht  (1.727 m)  Wt 174 lb (78.926 kg)  BMI 26.46 kg/m2  SpO2 100% Physical Exam  Constitutional: He appears well-developed and well-nourished.  Cardiovascular: Normal rate.   Pulmonary/Chest: Effort normal.  Musculoskeletal:  Large swollen indurated area on the dorsum of the right thumb with some fluctuance. Small area of purulent drainage. Decreased flexion extension to the swelling. Good capillary refill distally. There is erythema streaking up the arm on the palmar aspect.   Skin: Skin is warm.      ED Course  Procedures (including critical care time) Labs Review Labs Reviewed  CBC WITH DIFFERENTIAL/PLATELET - Abnormal;  Notable for the following:    WBC 12.2 (*)    RBC 4.11 (*)    Neutro Abs 9.4 (*)    Monocytes Absolute 1.2 (*)    All other components within normal limits  BASIC METABOLIC PANEL - Abnormal; Notable for the following:    Potassium 3.4 (*)    BUN 21 (*)    All other components within normal limits  WOUND CULTURE    Imaging Review No results found. I have personally reviewed and evaluated these images and lab results as part of my medical decision-making.   EKG Interpretation None      MDM   Final diagnoses:  Abscess of thumb, right    Patient with abscess of right thumb.  Drained. Culture sent. Will start vancomycin since has artery been on Bactrim and Keflex. Admit to internal medicine.  INCISION AND DRAINAGE Performed by: Billee CashingPICKERING,Ahtziry Saathoff R. Consent: Verbal consent obtained. Risks and benefits: risks, benefits and alternatives were discussed Type: abscess  Body area: R thumb  Anesthesia: local infiltration  Incision was made with a scalpel.  Local anesthetic: lidocaine 1%   Anesthetic total: 3 ml  Complexity: complex Blunt dissection to break up loculations  Drainage: purulent  Drainage amount: moderate  Patient tolerance: Patient tolerated the procedure well with no immediate complications.      Benjiman CoreNathan Benino Korinek, MD 12/15/14 2141

## 2014-12-15 NOTE — ED Notes (Signed)
Pt with infection to his R thumb, seen here on 11/13. Pt placed on abx, now has red streaking up his arm.

## 2014-12-15 NOTE — H&P (Signed)
[facility] History and Physical  Cory LeRicky L Castor UJW:[id number] DOB: 1960-04-08    PCP:   Cassell SmilesFUSCO,LAWRENCE J., MD   Chief Complaint: swelling of the right thumb with proximal red streaks.   HPI: Cory Roberts is an 54 y.o. male with hx of gout and Raynaud's syndrome, presented to the ER with purulent drainage and proximal red streaks from his right thumb to his forearm.  He has subjective fever and chills.  He said it started as a pustule on his thumb, and subsequently started to have some drainage.  He did not express his pustule.  In the ER, evaluation showed leukocytosis with WBC of 14k, normal renal Fx tests, and K of 3.4.  His thumb was I and D'ed, and culture sent to the labs, started on IV Vancomycin, and hospitalist was asked to admit him for cellulitis and abscess of the hand.   Rewiew of Systems:  Constitutional: Negative for malaise  No significant weight loss or weight gain Eyes: Negative for eye pain, redness and discharge, diplopia, visual changes, or flashes of light. ENMT: Negative for ear pain, hoarseness, nasal congestion, sinus pressure and sore throat. No headaches; tinnitus, drooling, or problem swallowing. Cardiovascular: Negative for chest pain, palpitations, diaphoresis, dyspnea and peripheral edema. ; No orthopnea, PND Respiratory: Negative for cough, hemoptysis, wheezing and stridor. No pleuritic chestpain. Gastrointestinal: Negative for nausea, vomiting, diarrhea, constipation, abdominal pain, melena, blood in stool, hematemesis, jaundice and rectal bleeding.    Genitourinary: Negative for frequency, dysuria, incontinence,flank pain and hematuria; Musculoskeletal: Negative for back pain and neck pain. Negative for swelling and trauma.;  Skin: . Negative for pruritus, rash, abrasions, bruising and  ulcerations Neuro: Negative for headache, lightheadedness and neck stiffness. Negative for weakness, altered level of consciousness , altered mental status, extremity  weakness, burning feet, involuntary movement, seizure and syncope.  Psych: negative for anxiety, depression, insomnia, tearfulness, panic attacks, hallucinations, paranoia, suicidal or homicidal ideation    Past Medical History  Diagnosis Date  . Raynaud's syndrome   . Arthritis Feb. 2013    Gout- Right foot  . Gout     Past Surgical History  Procedure Laterality Date  . Hernia repair  2007    Medications:  HOME MEDS: Prior to Admission medications   Medication Sig Start Date End Date Taking? Authorizing Provider  aspirin 325 MG tablet Take 1 tablet (325 mg total) by mouth daily. 10/31/11  Yes Renae FickleMackenzie Short, MD  cephALEXin (KEFLEX) 500 MG capsule Take 1 capsule (500 mg total) by mouth 4 (four) times daily. 12/13/14  Yes Eber HongBrian Miller, MD  ibuprofen (ADVIL,MOTRIN) 800 MG tablet Take 1 tablet (800 mg total) by mouth 3 (three) times daily. 12/13/14  Yes Eber HongBrian Miller, MD  sulfamethoxazole-trimethoprim (BACTRIM DS,SEPTRA DS) 800-160 MG tablet Take 1 tablet by mouth 2 (two) times daily. 12/13/14 12/20/14 Yes Eber HongBrian Miller, MD     Allergies:  Allergies  Allergen Reactions  . Codeine Nausea And Vomiting  . Hydrocodone Nausea Only    Social History:   reports that he quit smoking about 9 years ago. His smoking use included Cigarettes. He does not have any smokeless tobacco history on file. He reports that he does not drink alcohol or use illicit drugs.  Family History: Family History  Problem Relation Age of Onset  . Cancer Mother   . Hyperlipidemia Mother   . Hypertension Mother   . Cancer Father   . Heart disease Father [id number]    Heart Disease before age 56  .  Heart attack Father   . Diabetes Sister   . Cancer Sister   . Hyperlipidemia Sister   . Hypertension Sister   . Heart attack Mother   . Rheumatologic disease Mother      Physical Exam: Filed Vitals:   12/15/14 1636 12/15/14 1912 12/15/14 2123  BP: 129/77 118/79 115/93  Pulse: 74 69 84  Temp: 98.5 F (36.9 C)  97.5 F (36.4 C) 98.1 F (36.7 C)  TempSrc: Oral Oral Oral  Resp: Height:  (1.727 m)    Weight: 78.926 kg (174 lb)    SpO2: 98% 100% 100%   Blood pressure 115/93, pulse 84, temperature 98.1 F (36.7 C), temperature source Oral, resp. rate 16, height  (1.727 m), weight 78.926 kg (174 lb), SpO2 100 %.  GEN:  Pleasant  patient lying in the stretcher in no acute distress; cooperative with exam. PSYCH:  alert and oriented x4; does not appear anxious or depressed; affect is appropriate. HEENT: Mucous membranes pink and anicteric; PERRLA; EOM intact; no cervical lymphadenopathy nor thyromegaly or carotid bruit; no JVD; There were no stridor. Neck is very supple. Breasts:: Not examined CHEST WALL: No tenderness CHEST: Normal respiration, clear to auscultation bilaterally.  HEART: Regular rate and rhythm.  There are no murmur, rub, or gallops.   BACK: No kyphosis or scoliosis; no CVA tenderness ABDOMEN: soft and non-tender; no masses, no organomegaly, normal abdominal bowel sounds; no pannus; no intertriginous candida. There is no rebound and no distention. Rectal Exam: Not done EXTREMITIES: No bone or joint deformity; age-appropriate arthropathy of the hands and knees; no edema; no ulcerations.  There is no calf tenderness. Genitalia: not examined PULSES: 2+ and symmetric SKIN: He has purulent discharge of the right thumb, with swelling, tenderness and red streak proximally.  CNS: Cranial nerves 2-12 grossly intact no focal lateralizing neurologic deficit.  Speech is fluent; uvula elevated with phonation, facial symmetry and tongue midline. DTR are normal bilaterally, cerebella exam is intact, barbinski is negative and strengths are equaled bilaterally.  No sensory loss.   Labs on Admission:  Basic Metabolic Panel:  Recent Labs Lab 12/15/14 1943  NA 137  K 3.4*  CL 101  CO2 25  GLUCOSE 97  BUN 21*  CREATININE 1.07  CALCIUM 9.2   CBC:  Recent Labs Lab  12/15/14 1943  WBC 12.2*  NEUTROABS 9.4*  HGB 13.9  HCT 39.9  MCV 97.1  PLT 244   Assessment/Plan Present on Admission:  . Abscess of thumb . Dyslipidemia . Obesity . Cellulitis and abscess  PLAN:  Will admit for hand cellulitis and abscess.  Will continue with IV Vancomycin.  If he spike a temp of 101.5, will obtain BC.  Follow up on wound culture. For his low K, will supplement orally.  He is otherwise stable, full code, and will be admitted to [facility] service.   Thank you.   Other plans as per orders.  Code Status: FULL Unk Lightning, MD. [facility] Pager [phone] 7pm to 7am.  12/15/2014, 10:06 PM

## 2014-12-15 NOTE — Progress Notes (Signed)
ANTIBIOTIC CONSULT NOTE  Pharmacy Consult for Vancomycin Indication: cellulitis / wound infection  Allergies  Allergen Reactions  . Codeine Nausea And Vomiting  . Hydrocodone Nausea Only    Patient Measurements: Height: 5\' 8"  (172.7 cm) Weight: 180 lb 1.9 oz (81.7 kg) IBW/kg (Calculated) : 68.4  Vital Signs: Temp: 98.9 F (37.2 C) (11/15 2247) Temp Source: Oral (11/15 2247) BP: 125/64 mmHg (11/15 2247) Pulse Rate: 95 (11/15 2247) Intake/Output from previous day:   Intake/Output from this shift:    Labs:  Recent Labs  12/15/14 1943  WBC 12.2*  HGB 13.9  PLT 244  CREATININE 1.07   Estimated Creatinine Clearance: 74.6 mL/min (by C-G formula based on Cr of 1.07). No results for input(s): VANCOTROUGH, VANCOPEAK, VANCORANDOM, GENTTROUGH, GENTPEAK, GENTRANDOM, TOBRATROUGH, TOBRAPEAK, TOBRARND, AMIKACINPEAK, AMIKACINTROU, AMIKACIN in the last 72 hours.   Microbiology: No results found for this or any previous visit (from the past 720 hour(s)).  Anti-infectives    Start     Dose/Rate Route Frequency Ordered Stop   12/15/14 2115  vancomycin (VANCOCIN) IVPB 1000 mg/200 mL premix     1,000 mg 200 mL/hr over 60 Minutes Intravenous  Once 12/15/14 2111 12/15/14 2226     Assessment: Okay for Protocol, initial dose already given.  H&P pending.  Goal of Therapy:  Vancomycin trough level 10-15 mcg/ml  Plan:  Vancomycin 1gm IV every 12 hours. Measure antibiotic drug levels at steady state Follow up culture results  Mady GemmaHayes, Kellis Topete R 12/15/2014,10:58 PM

## 2014-12-16 LAB — CBC
HEMATOCRIT: 37.2 % — AB (ref 39.0–52.0)
Hemoglobin: 12.7 g/dL — ABNORMAL LOW (ref 13.0–17.0)
MCH: 33.2 pg (ref 26.0–34.0)
MCHC: 34.1 g/dL (ref 30.0–36.0)
MCV: 97.1 fL (ref 78.0–100.0)
Platelets: 218 10*3/uL (ref 150–400)
RBC: 3.83 MIL/uL — ABNORMAL LOW (ref 4.22–5.81)
RDW: 11.5 % (ref 11.5–15.5)
WBC: 12 10*3/uL — ABNORMAL HIGH (ref 4.0–10.5)

## 2014-12-16 LAB — BASIC METABOLIC PANEL
Anion gap: 7 (ref 5–15)
BUN: 16 mg/dL (ref 6–20)
CALCIUM: 8.7 mg/dL — AB (ref 8.9–10.3)
CO2: 25 mmol/L (ref 22–32)
CREATININE: 0.83 mg/dL (ref 0.61–1.24)
Chloride: 104 mmol/L (ref 101–111)
GFR calc non Af Amer: >60 mL/min (ref >60)
GLUCOSE: 112 mg/dL — AB (ref 65–99)
Potassium: 3.8 mmol/L (ref 3.5–5.1)
Sodium: 136 mmol/L (ref 135–145)

## 2014-12-16 NOTE — Progress Notes (Signed)
[facility] PROGRESS NOTE  Cory Roberts ZOX:[id number] DOB: May 15, 1960 DOA: 12/15/2014 PCP: Cassell Smiles., MD  Assessment/Plan: Right Thumb Abscess/Cellulitis -Have requested ortho consultation (?need for hand surgeon). -Continue vancomycin. -Wound cx collected in ED pending.  Hypokalemia -Repleted.  Leukocytosis -2/2 above. -improving with antibiotics.  Code Status: Full Code Family Communication: Patient only  Disposition Plan: To be determined pending orthopedics consultation. Continue IV antibiotics for now.   Consultants:  Ortho   Antibiotics:  Vancomycin   Subjective: Feels well. Believes thumb is more swollen.  Objective: Filed Vitals:   12/15/14 2123 12/15/14 2247 12/16/14 0525 12/16/14 1405  BP: 115/93 125/64 124/61 112/64  Pulse: 84 95 85 87  Temp: 98.1 F (36.7 C) 98.9 F (37.2 C) 98.1 F (36.7 C) 98.7 F (37.1 C)  TempSrc: Oral Oral Oral Oral  Resp: Height:   (1.727 m)    Weight:  81.7 kg (180 lb 1.9 oz)    SpO2: 100% 100% 100% 99%    Intake/Output Summary (Last 24 hours) at 12/16/14 1444 Last data filed at 12/16/14 1405  Gross per 24 hour  Intake    480 ml  Output      0 ml  Net    480 ml   Filed Weights   12/15/14 1636 12/15/14 2247  Weight: 78.926 kg (174 lb) 81.7 kg (180 lb 1.9 oz)    Exam:   General:  AA ox3  Cardiovascular: RRR  Respiratory: CTA B  Abdomen: S/NT/ND/+BS  Extremities: no C/C/E, right thumb wrapped in clean dressing with obvious edema and streaky erythema up to antecubital fossa.   Neurologic:  Grossly intact and non-focal  Data Reviewed: Basic Metabolic Panel:  Recent Labs Lab 12/15/14 1943 12/16/14 0650  NA 137 136  K 3.4* 3.8  CL 101 104  CO2 25 25  GLUCOSE 97 112*  BUN 21* 16  CREATININE 1.07 0.83  CALCIUM 9.2 8.7*   Liver Function Tests: No results for input(s): AST, ALT, ALKPHOS, BILITOT, PROT, ALBUMIN in the last 168 hours. No results for input(s):  LIPASE, AMYLASE in the last 168 hours. No results for input(s): AMMONIA in the last 168 hours. CBC:  Recent Labs Lab 12/15/14 1943 12/16/14 0650  WBC 12.2* 12.0*  NEUTROABS 9.4*  --   HGB 13.9 12.7*  HCT 39.9 37.2*  MCV 97.1 97.1  PLT 244 218   Cardiac Enzymes: No results for input(s): CKTOTAL, CKMB, CKMBINDEX, TROPONINI in the last 168 hours. BNP (last 3 results) No results for input(s): BNP in the last 8760 hours.  ProBNP (last 3 results) No results for input(s): PROBNP in the last 8760 hours.  CBG: No results for input(s): GLUCAP in the last 168 hours.  Recent Results (from the past 240 hour(s))  Culture, blood (routine x 2)     Status: None (Preliminary result)   Collection Time: 12/16/14 12:30 AM  Result Value Ref Range Status   Specimen Description BLOOD LEFT HAND  Final   Special Requests BOTTLES DRAWN AEROBIC AND ANAEROBIC 8CC EACH  Final   Culture NO GROWTH < 12 HOURS  Final   Report Status PENDING  Incomplete  Culture, blood (routine x 2)     Status: None (Preliminary result)   Collection Time: 12/16/14 12:45 AM  Result Value Ref Range Status   Specimen Description BLOOD LEFT HAND  Final   Special Requests BOTTLES DRAWN AEROBIC AND ANAEROBIC [facility] EACH  Final   Culture NO GROWTH <  12 HOURS  Final   Report Status PENDING  Incomplete     Studies: No results found.  Scheduled Meds: . aspirin  325 mg Oral Daily  . enoxaparin (LOVENOX) injection  40 mg Subcutaneous Q24H  . potassium chloride  40 mEq Oral Daily  . vancomycin  1,000 mg Intravenous Q12H   Continuous Infusions: . sodium chloride 75 mL/hr at 12/16/14 1255    Principal Problem:   Abscess of thumb Active Problems:   Obesity   Dyslipidemia   At risk for diabetes mellitus   Cellulitis and abscess    Time spent: 25 minutes. Greater than 50% of this time was spent in direct contact with the patient coordinating care.    Chaya JanHERNANDEZ ACOSTA,ESTELA  [facility] Pager [phone]  If  7PM-7AM, please contact night-coverage at [email or web address], password [email or web address] 12/16/2014, 2:44 PM  LOS: 1 day

## 2014-12-16 NOTE — Care Management Note (Signed)
Case Management Note  Patient Details  Name: Cory Roberts MRN: [id number] Date of Birth: September 06, 1960  Subjective/Objective:                  Pt admitted from home with cellulitis of the right hand. Pt lives with his wife and will return home at discharge. Pt is independent with ADL's.  Action/Plan: ? Need for home health at discharge. [facility] Will continue to follow.  Expected Discharge Date:                  Expected Discharge Plan:  Home/Self Care  In-House Referral:  NA  Discharge planning Services  CM Consult  Post Acute Care Choice:  NA Choice offered to:  NA  DME Arranged:    DME Agency:     HH Arranged:    HH Agency:     Status of Service:  In process, will continue to follow  Medicare Important Message Given:    Date Medicare IM Given:    Medicare IM give by:    Date Additional Medicare IM Given:    Additional Medicare Important Message give by:     If discussed at Long Length of Stay Meetings, dates discussed:    Additional Comments:  Cheryl FlashBlackwell, Yacoub Diltz Crowder, RN 12/16/2014, 2:54 PM

## 2014-12-17 ENCOUNTER — Inpatient Hospital Stay (HOSPITAL_COMMUNITY): Payer: BLUE CROSS/BLUE SHIELD

## 2014-12-17 LAB — CBC WITH DIFFERENTIAL/PLATELET
BASOS PCT: 0 %
Basophils Absolute: 0 10*3/uL (ref 0.0–0.1)
Eosinophils Absolute: 0.1 10*3/uL (ref 0.0–0.7)
Eosinophils Relative: 2 %
HEMATOCRIT: 39.4 % (ref 39.0–52.0)
HEMOGLOBIN: 13.4 g/dL (ref 13.0–17.0)
LYMPHS PCT: 22 %
Lymphs Abs: 1.5 10*3/uL (ref 0.7–4.0)
MCH: 33.3 pg (ref 26.0–34.0)
MCHC: 34 g/dL (ref 30.0–36.0)
MCV: 98 fL (ref 78.0–100.0)
MONOS PCT: 6 %
Monocytes Absolute: 0.4 10*3/uL (ref 0.1–1.0)
NEUTROS ABS: 4.9 10*3/uL (ref 1.7–7.7)
NEUTROS PCT: 70 %
Platelets: 239 10*3/uL (ref 150–400)
RBC: 4.02 MIL/uL — ABNORMAL LOW (ref 4.22–5.81)
RDW: 11.6 % (ref 11.5–15.5)
WBC: 7 10*3/uL (ref 4.0–10.5)

## 2014-12-17 LAB — MAGNESIUM: MAGNESIUM: 1.9 mg/dL (ref 1.7–2.4)

## 2014-12-17 LAB — BASIC METABOLIC PANEL
ANION GAP: 6 (ref 5–15)
BUN: 16 mg/dL (ref 6–20)
CALCIUM: 8.7 mg/dL — AB (ref 8.9–10.3)
CO2: 26 mmol/L (ref 22–32)
Chloride: 105 mmol/L (ref 101–111)
Creatinine, Ser: 0.91 mg/dL (ref 0.61–1.24)
Glucose, Bld: 160 mg/dL — ABNORMAL HIGH (ref 65–99)
POTASSIUM: 3.2 mmol/L — AB (ref 3.5–5.1)
Sodium: 137 mmol/L (ref 135–145)

## 2014-12-17 LAB — SURGICAL PCR SCREEN
MRSA, PCR: NEGATIVE
Staphylococcus aureus: POSITIVE — AB

## 2014-12-17 LAB — PROTIME-INR
INR: 1.12 (ref 0.00–1.49)
Prothrombin Time: 14.6 seconds (ref 11.6–15.2)

## 2014-12-17 LAB — APTT: APTT: 31 s (ref 24–37)

## 2014-12-17 MED ORDER — CHLORHEXIDINE GLUCONATE 4 % EX LIQD
60.0000 mL | Freq: Once | CUTANEOUS | Status: AC
  Administered 2014-12-18: 4 via TOPICAL
  Filled 2014-12-17: qty 15

## 2014-12-17 MED ORDER — POTASSIUM CHLORIDE CRYS ER 20 MEQ PO TBCR
40.0000 meq | EXTENDED_RELEASE_TABLET | ORAL | Status: AC
  Administered 2014-12-17 (×2): 40 meq via ORAL
  Filled 2014-12-17 (×2): qty 2

## 2014-12-17 MED ORDER — POTASSIUM CHLORIDE CRYS ER 20 MEQ PO TBCR
40.0000 meq | EXTENDED_RELEASE_TABLET | Freq: Once | ORAL | Status: AC
  Administered 2014-12-17: 40 meq via ORAL
  Filled 2014-12-17: qty 2

## 2014-12-17 NOTE — Progress Notes (Signed)
[facility] PROGRESS NOTE  Cory Roberts ZOX:[id number] DOB: 04/17/1960 DOA: 12/15/2014 PCP: Cassell Smiles., MD  Assessment/Plan: Right Thumb Abscess/Cellulitis -Ortho to see, plan for OR in am for I and D. -Continue vancomycin. -Wound cx collected in ED with staph aureus, sensitivities pending.  Hypokalemia -Continue to replete. -Check Mg level.  Leukocytosis -2/2 above. -Resolved with antibiotics.  Code Status: Full Code Family Communication: Patient only  Disposition Plan: To OR in am; home in 48-72 hours.   Consultants:  Ortho   Antibiotics:  Vancomycin   Subjective: Feels well.   Objective: Filed Vitals:   12/16/14 1405 12/16/14 2100 12/17/14 0544 12/17/14 1514  BP: 112/64 128/75 108/65 112/68  Pulse: 87 91 66 64  Temp: 98.7 F (37.1 C) 98.6 F (37 C) 98.1 F (36.7 C) 97.8 F (36.6 C)  TempSrc: Oral Oral Oral Oral  Resp: Height:      Weight:      SpO2: 99% 97% 99% 96%    Intake/Output Summary (Last 24 hours) at 12/17/14 1517 Last data filed at 12/17/14 1256  Gross per 24 hour  Intake    480 ml  Output      0 ml  Net    480 ml   Filed Weights   12/15/14 1636 12/15/14 2247  Weight: 78.926 kg (174 lb) 81.7 kg (180 lb 1.9 oz)    Exam:   General:  AA ox3  Cardiovascular: RRR  Respiratory: CTA B  Abdomen: S/NT/ND/+BS  Extremities: no C/C/E, right thumb wrapped in clean dressing with obvious edema and streaky erythema up to antecubital fossa.   Neurologic:  Grossly intact and non-focal  Data Reviewed: Basic Metabolic Panel:  Recent Labs Lab 12/15/14 1943 12/16/14 0650 12/17/14 0919  NA 137 136 137  K 3.4* 3.8 3.2*  CL 101 104 105  CO2 GLUCOSE 97 112* 160*  BUN 21* 16 16  CREATININE 1.07 0.83 0.91  CALCIUM 9.2 8.7* 8.7*   Liver Function Tests: No results for input(s): AST, ALT, ALKPHOS, BILITOT, PROT, ALBUMIN in the last 168 hours. No results for input(s): LIPASE, AMYLASE in the last  168 hours. No results for input(s): AMMONIA in the last 168 hours. CBC:  Recent Labs Lab 12/15/14 1943 12/16/14 0650 12/17/14 0919  WBC 12.2* 12.0* 7.0  NEUTROABS 9.4*  --  4.9  HGB 13.9 12.7* 13.4  HCT 39.9 37.2* 39.4  MCV 97.1 97.1 98.0  PLT 244 218 239   Cardiac Enzymes: No results for input(s): CKTOTAL, CKMB, CKMBINDEX, TROPONINI in the last 168 hours. BNP (last 3 results) No results for input(s): BNP in the last 8760 hours.  ProBNP (last 3 results) No results for input(s): PROBNP in the last 8760 hours.  CBG: No results for input(s): GLUCAP in the last 168 hours.  Recent Results (from the past 240 hour(s))  Culture, routine-abscess     Status: None (Preliminary result)   Collection Time: 12/15/14  8:56 PM  Result Value Ref Range Status   Specimen Description ABSCESS RIGHT THUMB  Final   Special Requests NONE  Final   Gram Stain   Final    RARE WBC PRESENT, PREDOMINANTLY MONONUCLEAR NO SQUAMOUS EPITHELIAL CELLS SEEN NO ORGANISMS SEEN Performed at [facility]    Culture   Final    ABUNDANT STAPHYLOCOCCUS AUREUS Note: RIFAMPIN AND GENTAMICIN SHOULD NOT BE USED AS SINGLE DRUGS FOR TREATMENT OF STAPH INFECTIONS. Performed at [facility]  Report Status PENDING  Incomplete  Culture, blood (routine x 2)     Status: None (Preliminary result)   Collection Time: 12/16/14 12:30 AM  Result Value Ref Range Status   Specimen Description BLOOD LEFT HAND  Final   Special Requests BOTTLES DRAWN AEROBIC AND ANAEROBIC 8CC EACH  Final   Culture NO GROWTH 1 DAY  Final   Report Status PENDING  Incomplete  Culture, blood (routine x 2)     Status: None (Preliminary result)   Collection Time: 12/16/14 12:45 AM  Result Value Ref Range Status   Specimen Description BLOOD LEFT HAND  Final   Special Requests BOTTLES DRAWN AEROBIC AND ANAEROBIC [facility] EACH  Final   Culture NO GROWTH 1 DAY  Final   Report Status PENDING  Incomplete     Studies: Dg Chest Port 1  View  12/17/2014  CLINICAL DATA:  Preop cardiovascular exam. EXAM: PORTABLE CHEST 1 VIEW COMPARISON:  10/29/2011 FINDINGS: The heart size and mediastinal contours are within normal limits. Both lungs are clear. The visualized skeletal structures are unremarkable. IMPRESSION: No active disease. Electronically Signed   By: Norva PavlovElizabeth  Brown M.D.   On: 12/17/2014 08:56   Dg Hand Complete Right  12/17/2014  CLINICAL DATA:  Cellulitis right thumb EXAM: RIGHT HAND - COMPLETE 3+ VIEW COMPARISON:  None. FINDINGS: Soft tissue swelling of the thumb. There is gas in the soft tissues compatible with infection and wound. Negative for osteomyelitis Negative for fracture or arthropathy IMPRESSION: Soft tissue swelling of the thumb. Negative for acute bony abnormality. Electronically Signed   By: Marlan Palauharles  Clark M.D.   On: 12/17/2014 07:57    Scheduled Meds: . aspirin  325 mg Oral Daily  . chlorhexidine  60 mL Topical Once  . potassium chloride  40 mEq Oral Daily  . vancomycin  1,000 mg Intravenous Q12H   Continuous Infusions: . sodium chloride 75 mL/hr at 12/17/14 0201    Principal Problem:   Abscess of thumb Active Problems:   Obesity   Dyslipidemia   At risk for diabetes mellitus   Cellulitis and abscess    Time spent: 25 minutes. Greater than 50% of this time was spent in direct contact with the patient coordinating care.    Chaya JanHERNANDEZ ACOSTA,ESTELA  [facility] Pager [phone]  If 7PM-7AM, please contact night-coverage at [email or web address], password [email or web address] 12/17/2014, 3:17 PM  LOS: 2 days

## 2014-12-17 NOTE — Progress Notes (Signed)
Lab notified RN that patient's surgical pcr screening came back staph positive.  Patient notified.

## 2014-12-17 NOTE — Consult Note (Addendum)
Patient ID: CURT OATIS, male   DOB: 04-19-1960, 54 y.o.   MRN: [id number]  CONSULT [id number] Detailed history Detailed exam Low complexity  Chief Complaint  Patient presents with  . Wound Infection    HPI Cory Roberts is a 54 y.o. male.  Who is being evaluated for abscess right thumb at the request of the medical service Dr. Lily Peer  Right thumb abscess. Duration since last Friday. Severity pain is mild to moderate but abscess had to be drained in the ER. Quality of pain dull ache. Worsened by movement and relieved by rest   Review of Systems (at least 2) Review of Systems  Respiratory: Positive for wheezing.    1. Fever no 2. Erythema and drainage yes 3. Motion loss in the right thumb yes   Past Medical History  Diagnosis Date  . Raynaud's syndrome   . Arthritis Feb. 2013    Gout- Right foot  . Gout      Past Surgical History  Procedure Laterality Date  . Hernia repair  2007     Social History  Social History  Substance Use Topics  . Smoking status: Former Smoker    Types: Cigarettes    Quit date: 01/30/2005  . Smokeless tobacco: None  . Alcohol Use: No    Allergies  Allergen Reactions  . Codeine Nausea And Vomiting  . Hydrocodone Nausea Only    Current Facility-Administered Medications  Medication Dose Route Frequency Provider Last Rate Last Dose  . 0.9 %  sodium chloride infusion   Intravenous Continuous Houston Siren, MD 75 mL/hr at 12/17/14 0201    . acetaminophen (TYLENOL) tablet 650 mg  650 mg Oral Q6H PRN Houston Siren, MD   650 mg at 12/16/14 2118   Or  . acetaminophen (TYLENOL) suppository 650 mg  650 mg Rectal Q6H PRN Houston Siren, MD      . aspirin tablet 325 mg  325 mg Oral Daily Houston Siren, MD   325 mg at 12/16/14 1046  . morphine 2 MG/ML injection 2 mg  2 mg Intravenous Q2H PRN Houston Siren, MD   2 mg at 12/16/14 0909  . ondansetron (ZOFRAN) tablet 4 mg  4 mg Oral Q6H PRN Houston Siren, MD       Or  . ondansetron [facility]) injection 4 mg  4 mg Intravenous Q6H PRN  Houston Siren, MD   4 mg at 12/16/14 0105  . potassium chloride SA (K-DUR,KLOR-CON) CR tablet 40 mEq  40 mEq Oral Daily Houston Siren, MD   40 mEq at 12/16/14 1046  . vancomycin (VANCOCIN) IVPB 1000 mg/200 mL premix  1,000 mg Intravenous Q12H Houston Siren, MD   1,000 mg at 12/16/14 2118      Physical Exam-Detailed Physical Exam  Blood pressure 108/65, pulse 66, temperature 98.1 F (36.7 C), temperature source Oral, resp. rate 18, height  (1.727 m), weight 180 lb 1.9 oz (81.7 kg), SpO2 99 %. Gen. appearance normal appearance medium frame Cardiovascular exam the pulses are 2+ with no peripheral edema  The ambulatory status is noncontributory  Extremity examined right thumb  Inspection drainage purulent erythema involving the entire thumb including down to the level of the metacarpal with necrotic skin Range of motion assessment IP joint motion is diminished  Stability assessment stability test reveal no instability or laxity muscle tone  normal with no atrophy or tremors Skin erythema, a laceration from the drainage  Sensation to touch is normal The patient is oriented to person place  and time The patient's mood and affect show no depression or anxiety or agitation Capillary refill is normal  For comparison the opposite extremity was examined and the following findings were noted  Normal range of motion stability strength appearance skin sensation and pulse  MEDICAL DECISION MAKING (minimum/low)  Data Reviewed  I have personally reviewed the imaging studies and the report and my interpretation is:  Soft tissue swelling without bone involvement  Assessment    Abscess right thumb    Plan    Stop anticoagulation Dressing changes Continue IV antibiotic Incision and drainage right       Fuller CanadaStanley Harrison 12/17/2014, 7:25 AM

## 2014-12-18 ENCOUNTER — Encounter (HOSPITAL_COMMUNITY)
Admission: EM | Disposition: A | Discharge status: Home Health | Payer: Self-pay | Source: Home / Self Care | Attending: Internal Medicine

## 2014-12-18 ENCOUNTER — Inpatient Hospital Stay (HOSPITAL_COMMUNITY): Payer: BLUE CROSS/BLUE SHIELD | Admitting: Anesthesiology

## 2014-12-18 DIAGNOSIS — L02511 Cutaneous abscess of right hand: Secondary | ICD-10-CM | POA: Diagnosis present

## 2014-12-18 HISTORY — PX: OTHER SURGICAL HISTORY: SHX169

## 2014-12-18 HISTORY — PX: INCISION AND DRAINAGE: SHX5863

## 2014-12-18 LAB — CULTURE, ROUTINE-ABSCESS

## 2014-12-18 LAB — BASIC METABOLIC PANEL
Anion gap: 5 (ref 5–15)
BUN: 15 mg/dL (ref 6–20)
CHLORIDE: 107 mmol/L (ref 101–111)
CO2: 26 mmol/L (ref 22–32)
Calcium: 9.1 mg/dL (ref 8.9–10.3)
Creatinine, Ser: 0.97 mg/dL (ref 0.61–1.24)
GFR calc Af Amer: >60 mL/min (ref >60)
GFR calc non Af Amer: >60 mL/min (ref >60)
GLUCOSE: 105 mg/dL — AB (ref 65–99)
POTASSIUM: 4.3 mmol/L (ref 3.5–5.1)
Sodium: 138 mmol/L (ref 135–145)

## 2014-12-18 LAB — VANCOMYCIN, TROUGH: Vancomycin Tr: 11 ug/mL (ref 10.0–20.0)

## 2014-12-18 SURGERY — INCISION AND DRAINAGE
Anesthesia: General | Laterality: Right

## 2014-12-18 MED ORDER — SODIUM CHLORIDE 0.9 % IR SOLN
Status: DC | PRN
Start: 2014-12-18 — End: 2014-12-18
  Administered 2014-12-18: 500 mL

## 2014-12-18 MED ORDER — DOCUSATE SODIUM 100 MG PO CAPS
100.0000 mg | ORAL_CAPSULE | Freq: Two times a day (BID) | ORAL | Status: DC
  Administered 2014-12-18 – 2014-12-23 (×9): 100 mg via ORAL
  Filled 2014-12-18 (×9): qty 1

## 2014-12-18 MED ORDER — ONDANSETRON HCL 4 MG/2ML IJ SOLN: 4.0000 mg | Freq: Once | INTRAMUSCULAR | Status: DC | PRN

## 2014-12-18 MED ORDER — FENTANYL CITRATE (PF) 100 MCG/2ML IJ SOLN
25.0000 ug | INTRAMUSCULAR | Status: DC | PRN
  Administered 2014-12-18 (×2): 50 ug via INTRAVENOUS
  Filled 2014-12-18: qty 2

## 2014-12-18 MED ORDER — FENTANYL CITRATE (PF) 100 MCG/2ML IJ SOLN
INTRAMUSCULAR | Status: DC | PRN
  Administered 2014-12-18 (×2): 50 ug via INTRAVENOUS

## 2014-12-18 MED ORDER — PROPOFOL 10 MG/ML IV BOLUS
INTRAVENOUS | Status: AC
  Filled 2014-12-18: qty 20

## 2014-12-18 MED ORDER — ONDANSETRON HCL 4 MG/2ML IJ SOLN
INTRAMUSCULAR | Status: AC
  Filled 2014-12-18: qty 2

## 2014-12-18 MED ORDER — SODIUM CHLORIDE 0.9 % IV SOLN
INTRAVENOUS | Status: DC
  Administered 2014-12-18: 75 mL/h via INTRAVENOUS
  Administered 2014-12-19 – 2014-12-21 (×2): via INTRAVENOUS

## 2014-12-18 MED ORDER — HYDROCODONE-ACETAMINOPHEN 5-325 MG PO TABS
1.0000 | ORAL_TABLET | ORAL | Status: DC | PRN
  Administered 2014-12-18 – 2014-12-21 (×7): 2 via ORAL
  Filled 2014-12-18 (×7): qty 2

## 2014-12-18 MED ORDER — LACTATED RINGERS IV SOLN
INTRAVENOUS | Status: DC
  Administered 2014-12-18: 11:00:00 via INTRAVENOUS

## 2014-12-18 MED ORDER — ONDANSETRON HCL 4 MG/2ML IJ SOLN
4.0000 mg | Freq: Four times a day (QID) | INTRAMUSCULAR | Status: DC | PRN
  Administered 2014-12-18: 4 mg via INTRAVENOUS

## 2014-12-18 MED ORDER — MIDAZOLAM HCL 2 MG/2ML IJ SOLN
INTRAMUSCULAR | Status: AC
  Filled 2014-12-18: qty 2

## 2014-12-18 MED ORDER — ONDANSETRON HCL 4 MG/2ML IJ SOLN
INTRAMUSCULAR | Status: DC | PRN
  Administered 2014-12-18: 4 mg via INTRAVENOUS

## 2014-12-18 MED ORDER — PROPOFOL 10 MG/ML IV BOLUS
INTRAVENOUS | Status: DC | PRN
  Administered 2014-12-18: 50 mg via INTRAVENOUS
  Administered 2014-12-18: 150 mg via INTRAVENOUS

## 2014-12-18 MED ORDER — FENTANYL CITRATE (PF) 100 MCG/2ML IJ SOLN
INTRAMUSCULAR | Status: AC
  Filled 2014-12-18: qty 2

## 2014-12-18 MED ORDER — SUCCINYLCHOLINE CHLORIDE 20 MG/ML IJ SOLN
INTRAMUSCULAR | Status: DC | PRN
  Administered 2014-12-18: 20 mg via INTRAVENOUS

## 2014-12-18 MED ORDER — FENTANYL CITRATE (PF) 100 MCG/2ML IJ SOLN
25.0000 ug | INTRAMUSCULAR | Status: AC
  Administered 2014-12-18 (×2): 25 ug via INTRAVENOUS

## 2014-12-18 MED ORDER — MIDAZOLAM HCL 2 MG/2ML IJ SOLN
1.0000 mg | INTRAMUSCULAR | Status: DC | PRN
  Administered 2014-12-18: 2 mg via INTRAVENOUS

## 2014-12-18 MED ORDER — ONDANSETRON HCL 4 MG/2ML IJ SOLN
4.0000 mg | Freq: Once | INTRAMUSCULAR | Status: AC
  Administered 2014-12-18: 4 mg via INTRAVENOUS

## 2014-12-18 MED ORDER — BUPIVACAINE HCL (PF) 0.5 % IJ SOLN
INTRAMUSCULAR | Status: AC
  Filled 2014-12-18: qty 30

## 2014-12-18 MED ORDER — ONDANSETRON HCL 4 MG PO TABS: 4.0000 mg | ORAL_TABLET | Freq: Four times a day (QID) | ORAL | Status: DC | PRN

## 2014-12-18 MED ORDER — TRAZODONE HCL 50 MG PO TABS: 25.0000 mg | ORAL_TABLET | Freq: Every evening | ORAL | Status: DC | PRN

## 2014-12-18 SURGICAL SUPPLY — 40 items
BAG HAMPER (MISCELLANEOUS) ×2 IMPLANT
BANDAGE ELASTIC 2 VELCRO NS LF (GAUZE/BANDAGES/DRESSINGS) ×2 IMPLANT
BANDAGE ELASTIC 3 VELCRO ST LF (GAUZE/BANDAGES/DRESSINGS) IMPLANT
BANDAGE ELASTIC 6 VELCRO ST LF (GAUZE/BANDAGES/DRESSINGS) IMPLANT
BANDAGE ESMARK 4X12 BL STRL LF (DISPOSABLE) ×1 IMPLANT
BLADE SURG 15 STRL LF DISP TIS (BLADE) ×1 IMPLANT
BLADE SURG 15 STRL SS (BLADE) ×1
BNDG CONFORM 2 STRL LF (GAUZE/BANDAGES/DRESSINGS) ×4 IMPLANT
BNDG ESMARK 4X12 BLUE STRL LF (DISPOSABLE) ×2
CLOTH BEACON ORANGE TIMEOUT ST (SAFETY) ×2 IMPLANT
COVER LIGHT HANDLE STERIS (MISCELLANEOUS) ×4 IMPLANT
CUFF TOURNIQUET SINGLE 18IN (TOURNIQUET CUFF) ×2 IMPLANT
DRSG ALLEVYN 2X2 (GAUZE/BANDAGES/DRESSINGS) IMPLANT
ELECT REM PT RETURN 9FT ADLT (ELECTROSURGICAL) ×2
ELECTRODE REM PT RTRN 9FT ADLT (ELECTROSURGICAL) ×1 IMPLANT
GAUZE IODOFORM PACK 1/2 7832 (GAUZE/BANDAGES/DRESSINGS) ×2 IMPLANT
GAUZE SPONGE 4X4 12PLY STRL (GAUZE/BANDAGES/DRESSINGS) IMPLANT
GLOVE BIOGEL PI IND STRL 7.0 (GLOVE) ×1 IMPLANT
GLOVE BIOGEL PI INDICATOR 7.0 (GLOVE) ×1
GLOVE SKINSENSE NS SZ8.0 LF (GLOVE) ×1
GLOVE SKINSENSE STRL SZ8.0 LF (GLOVE) ×1 IMPLANT
GLOVE SS N UNI LF 8.5 STRL (GLOVE) ×2 IMPLANT
GOWN STRL REUS W/TWL LRG LVL3 (GOWN DISPOSABLE) ×6 IMPLANT
GOWN STRL REUS W/TWL XL LVL3 (GOWN DISPOSABLE) ×2 IMPLANT
HAND ALUMI LG (SOFTGOODS) ×2 IMPLANT
KIT ROOM TURNOVER APOR (KITS) ×2 IMPLANT
MANIFOLD NEPTUNE II (INSTRUMENTS) ×2 IMPLANT
NEEDLE HYPO 21X1.5 SAFETY (NEEDLE) ×2 IMPLANT
NS IRRIG 1000ML POUR BTL (IV SOLUTION) ×2 IMPLANT
PACK BASIC LIMB (CUSTOM PROCEDURE TRAY) IMPLANT
PAD ABD 5X9 TENDERSORB (GAUZE/BANDAGES/DRESSINGS) IMPLANT
PAD ARMBOARD 7.5X6 YLW CONV (MISCELLANEOUS) ×2 IMPLANT
PADDING CAST COTTON 6X4 STRL (CAST SUPPLIES) IMPLANT
SET BASIN LINEN APH (SET/KITS/TRAYS/PACK) ×2 IMPLANT
SUT ETHILON 3 0 FSL (SUTURE) ×2 IMPLANT
SWAB CULTURE ESWAB REG 1ML (MISCELLANEOUS) ×2 IMPLANT
SWAB CULTURE LIQ STUART DBL (MISCELLANEOUS) ×2 IMPLANT
SYR BULB IRRIGATION 50ML (SYRINGE) ×2 IMPLANT
SYR CONTROL 10ML LL (SYRINGE) ×2 IMPLANT
VESSEL LOOPS MAXI RED (MISCELLANEOUS) IMPLANT

## 2014-12-18 NOTE — Progress Notes (Signed)
Late Entry:  Dr. Ardyth HarpsHernandez was made aware of the the right hand cx resulting positive for MRSA.  Patient is already on vancomycin.  No new orders given.  The patient also c/o nausea and it was to early according to frequency.  Request change of time to Q 4 hrs.  New orders given and followed.

## 2014-12-18 NOTE — Progress Notes (Signed)
[facility] PROGRESS NOTE  Cory Roberts ZOX:[id number] DOB: 06-Apr-1960 DOA: 12/15/2014 PCP: Cassell Smiles., MD  Assessment/Plan: Right Thumb Abscess/Cellulitis -S/p I and D in OR today. -Appreciate ortho input and recommendations. -Per patient there are plans to return to OR. -Continue vancomycin. -Wound cx collected in ED with MRSA, multiple PO options.  Hypokalemia -Repleted. -Mg ok at 1.9.  Leukocytosis -2/2 above. -Resolved with antibiotics.  Code Status: Full Code Family Communication: Patient and wife at bedside Disposition Plan: As per ortho   Consultants:  Ortho   Antibiotics:  Vancomycin   Subjective: Is starting to have some post-surgical pain.  Objective: Filed Vitals:   12/18/14 1244 12/18/14 1253 12/18/14 1314 12/18/14 1420  BP: 133/92 135/85 124/75 110/75  Pulse:    75  Temp:   97.5 F (36.4 C) 97.4 F (36.3 C)  TempSrc:    Oral  Resp: Height:      Weight:      SpO2: 100%  96% 97%    Intake/Output Summary (Last 24 hours) at 12/18/14 1549 Last data filed at 12/18/14 1420  Gross per 24 hour  Intake   1140 ml  Output    200 ml  Net    940 ml   Filed Weights   12/15/14 1636 12/15/14 2247 12/18/14 0952  Weight: 78.926 kg (174 lb) 81.7 kg (180 lb 1.9 oz) 81.647 kg (180 lb)    Exam:   General:  AA ox3  Cardiovascular: RRR  Respiratory: CTA B  Abdomen: S/NT/ND/+BS  Extremities: no C/C/E, right thumb wrapped in clean dressing. Streaky erythema resolved.  Neurologic:  Grossly intact and non-focal  Data Reviewed: Basic Metabolic Panel:  Recent Labs Lab 12/15/14 1943 12/16/14 0650 12/17/14 0919 12/17/14 1548 12/18/14 0657  NA 137 136 137  --  138  K 3.4* 3.8 3.2*  --  4.3  CL 101 104 105  --  107  CO2 --  26  GLUCOSE 97 112* 160*  --  105*  BUN 21* 16 16  --  15  CREATININE 1.07 0.83 0.91  --  0.97  CALCIUM 9.2 8.7* 8.7*  --  9.1  MG  --   --   --  1.9  --    Liver Function  Tests: No results for input(s): AST, ALT, ALKPHOS, BILITOT, PROT, ALBUMIN in the last 168 hours. No results for input(s): LIPASE, AMYLASE in the last 168 hours. No results for input(s): AMMONIA in the last 168 hours. CBC:  Recent Labs Lab 12/15/14 1943 12/16/14 0650 12/17/14 0919  WBC 12.2* 12.0* 7.0  NEUTROABS 9.4*  --  4.9  HGB 13.9 12.7* 13.4  HCT 39.9 37.2* 39.4  MCV 97.1 97.1 98.0  PLT 244 218 239   Cardiac Enzymes: No results for input(s): CKTOTAL, CKMB, CKMBINDEX, TROPONINI in the last 168 hours. BNP (last 3 results) No results for input(s): BNP in the last 8760 hours.  ProBNP (last 3 results) No results for input(s): PROBNP in the last 8760 hours.  CBG: No results for input(s): GLUCAP in the last 168 hours.  Recent Results (from the past 240 hour(s))  Culture, routine-abscess     Status: None   Collection Time: 12/15/14  8:56 PM  Result Value Ref Range Status   Specimen Description ABSCESS RIGHT THUMB  Final   Special Requests NONE  Final   Gram Stain   Final    RARE WBC PRESENT, PREDOMINANTLY MONONUCLEAR NO SQUAMOUS  EPITHELIAL CELLS SEEN NO ORGANISMS SEEN Performed at [facility]    Culture   Final    ABUNDANT METHICILLIN RESISTANT STAPHYLOCOCCUS AUREUS Note: RIFAMPIN AND GENTAMICIN SHOULD NOT BE USED AS SINGLE DRUGS FOR TREATMENT OF STAPH INFECTIONS. This organism DOES NOT demonstrate inducible Clindamycin resistance in vitro. CRITICAL RESULT CALLED TO, READ BACK BY AND VERIFIED WITH: TAMEEKA WATKINS @  1049 on [id number] BY [facility] Performed at [facility]    Report Status 12/18/2014 FINAL  Final   Organism ID, Bacteria METHICILLIN RESISTANT STAPHYLOCOCCUS AUREUS  Final      Susceptibility   Methicillin resistant staphylococcus aureus - MIC*    CLINDAMYCIN <=0.25 SENSITIVE Sensitive     ERYTHROMYCIN >=8 RESISTANT Resistant     GENTAMICIN <=0.5 SENSITIVE Sensitive     LEVOFLOXACIN 4 INTERMEDIATE Intermediate     OXACILLIN >=4 RESISTANT  Resistant     RIFAMPIN <=0.5 SENSITIVE Sensitive     TRIMETH/SULFA <=10 SENSITIVE Sensitive     VANCOMYCIN <=0.5 SENSITIVE Sensitive     TETRACYCLINE <=1 SENSITIVE Sensitive     * ABUNDANT METHICILLIN RESISTANT STAPHYLOCOCCUS AUREUS  Culture, blood (routine x 2)     Status: None (Preliminary result)   Collection Time: 12/16/14 12:30 AM  Result Value Ref Range Status   Specimen Description BLOOD LEFT HAND  Final   Special Requests BOTTLES DRAWN AEROBIC AND ANAEROBIC 8CC EACH  Final   Culture NO GROWTH 2 DAYS  Final   Report Status PENDING  Incomplete  Culture, blood (routine x 2)     Status: None (Preliminary result)   Collection Time: 12/16/14 12:45 AM  Result Value Ref Range Status   Specimen Description BLOOD LEFT HAND  Final   Special Requests BOTTLES DRAWN AEROBIC AND ANAEROBIC 6CC EACH  Final   Culture NO GROWTH 2 DAYS  Final   Report Status PENDING  Incomplete  Surgical pcr screen     Status: Abnormal   Collection Time: 12/17/14  4:00 PM  Result Value Ref Range Status   MRSA, PCR NEGATIVE NEGATIVE Final   Staphylococcus aureus POSITIVE (A) NEGATIVE Final    Comment: RESULT CALLED TO, READ BACK BY AND VERIFIED WITH: PATTEN,M AT 2032 ON 12/17/2014 BY ISLEY,B        The Xpert SA Assay (FDA approved for NASAL specimens in patients over 56 years of age), is one component of a comprehensive surveillance program.  Test performance has been validated by [facility] for patients greater than or equal to 56 year old. It is not intended to diagnose infection nor to guide or monitor treatment.      Studies: Dg Chest Port 1 View  12/17/2014  CLINICAL DATA:  Preop cardiovascular exam. EXAM: PORTABLE CHEST 1 VIEW COMPARISON:  10/29/2011 FINDINGS: The heart size and mediastinal contours are within normal limits. Both lungs are clear. The visualized skeletal structures are unremarkable. IMPRESSION: No active disease. Electronically Signed   By: Norva PavlovElizabeth  Brown M.D.   On: 12/17/2014  08:56   Dg Hand Complete Right  12/17/2014  CLINICAL DATA:  Cellulitis right thumb EXAM: RIGHT HAND - COMPLETE 3+ VIEW COMPARISON:  None. FINDINGS: Soft tissue swelling of the thumb. There is gas in the soft tissues compatible with infection and wound. Negative for osteomyelitis Negative for fracture or arthropathy IMPRESSION: Soft tissue swelling of the thumb. Negative for acute bony abnormality. Electronically Signed   By: Marlan Palauharles  Clark M.D.   On: 12/17/2014 07:57    Scheduled Meds: . aspirin  325 mg  Oral Daily  . docusate sodium  100 mg Oral BID  . potassium chloride  40 mEq Oral Daily  . vancomycin  1,000 mg Intravenous Q12H   Continuous Infusions: . sodium chloride 1,000 mL (12/17/14 1859)  . sodium chloride      Principal Problem:   Abscess of thumb Active Problems:   Obesity   Dyslipidemia   At risk for diabetes mellitus   Cellulitis and abscess   Abscess of right thumb    Time spent: 25 minutes. Greater than 50% of this time was spent in direct contact with the patient coordinating care.    Chaya Jan  [facility] Pager [phone]  If 7PM-7AM, please contact night-coverage at [email or web address], password [facility] 12/18/2014, 3:49 PM  LOS: 3 days

## 2014-12-18 NOTE — Op Note (Signed)
12/15/2014 - 12/18/2014  12:01 PM  PATIENT:  Cory Roberts  54 y.o. male  PRE-OPERATIVE DIAGNOSIS:  INFECTION RIGHT THUMB  POST-OPERATIVE DIAGNOSIS:  INFECTION RIGHT THUMB  PROCEDURE:  Procedure(s): INCISION AND DRAINAGE THUMB (Right)   Surgical findings purulent material was noted down to the extensor tendon and musculature of the first extensor compartment. The wound was opened up to 5 cm to allow adequate drainage and then undermining of the soft tissues until of pus was evacuated.  Procedure details chart review site confirmation right thumb marked confirmed. Patient surgery general anesthesia. Betadine prep and sterile drape.  Puncture wound from the ER was extended onto the fascia up to the distal aspect of the first metacarpal and across the IP joint of the thumb. Purulent material was evacuated with undermining of the soft tissues as needed. Thorough saline irrigation was performed with bulb syringe. We debrided skin and subcutaneous tissue. We did not debride muscle.  This was sharply excised with the scalpel for the debridement. Scissors were used to trim edges and make a smooth wound.  Wound was packed with iodoform gauze. Followed by 4 x 4's followed by 2 circular gauze and then an Ace wrap  Digital block was performed with the Marcaine  Circumferentially and in the first webspace, this was done prior to the dressing.  Sterile dressing applied SURGEON:  Surgeon(s) and Role:    * Vickki HearingStanley E Harrison, MD - Primary  PHYSICIAN ASSISTANT:   ASSISTANTS: none   ANESTHESIA:   general  EBL:  Total I/O In: 700 [I.V.:700] Out: 0   BLOOD ADMINISTERED:none  DRAINS: none   LOCAL MEDICATIONS USED:  MARCAINE    and Amount: 20 ml  SPECIMEN:  Source of Specimen:  Cultures of the wound anaerobic aerobic  DISPOSITION OF SPECIMEN:  Microbiology culture specimen right thumb wound  COUNTS:  YES  TOURNIQUET:    DICTATION: .Dragon Dictation  PLAN OF CARE: Admit to inpatient    PATIENT DISPOSITION:  PACU - hemodynamically stable.   Delay start of Pharmacological VTE agent (>24hrs) due to surgical blood loss or risk of bleeding: not applicable

## 2014-12-18 NOTE — Care Management Note (Signed)
Case Management Note  Patient Details  Name: Cory Roberts MRN: [id number] Date of Birth: Mar 08, 1960  Subjective/Objective:                    Action/Plan:   Expected Discharge Date:                  Expected Discharge Plan:  Home/Self Care  In-House Referral:  NA  Discharge planning Services  CM Consult  Post Acute Care Choice:  NA Choice offered to:  NA  DME Arranged:    DME Agency:     HH Arranged:    HH Agency:     Status of Service:  Completed, signed off  Medicare Important Message Given:    Date Medicare IM Given:    Medicare IM give by:    Date Additional Medicare IM Given:    Additional Medicare Important Message give by:     If discussed at Long Length of Stay Meetings, dates discussed:    Additional Comments: Pt had I&D today. Anticipate discharge over the weekend. If pt needs home health services for wound care, weekend staff can arrange with agency of pts choice. [name], [name], RN 12/18/2014, 1:57 PM

## 2014-12-18 NOTE — Anesthesia Postprocedure Evaluation (Signed)
  Anesthesia Post-op Note  Patient: Cory Roberts L Mandala  Procedure(s) Performed: Procedure(s): INCISION AND DRAINAGE THUMB (Right)  Patient Location: PACU  Anesthesia Type:General  Level of Consciousness: awake and patient cooperative  Airway and Oxygen Therapy: Patient Spontanous Breathing  Post-op Pain: mild  Post-op Assessment: Post-op Vital signs reviewed, Patient's Cardiovascular Status Stable, Respiratory Function Stable, Patent Airway and Adequate PO intake              Post-op Vital Signs: Reviewed and stable  Last Vitals:  Filed Vitals:   12/18/14 1212  BP: 137/85  Pulse:   Temp: 36.6 C  Resp: 16    Complications: No apparent anesthesia complications

## 2014-12-18 NOTE — Anesthesia Preprocedure Evaluation (Signed)
Anesthesia Evaluation  Patient identified by MRN, date of birth, ID band Patient awake    Reviewed: Allergy & Precautions, NPO status , Patient's Chart, lab work & pertinent test results  Airway Mallampati: I  TM Distance: >3 FB     Dental  (+) Poor Dentition, Dental Advisory Given   Pulmonary neg pulmonary ROS, former smoker,    breath sounds clear to auscultation       Cardiovascular negative cardio ROS   Rhythm:Regular Rate:Normal     Neuro/Psych  Headaches, CVA, No Residual Symptoms    GI/Hepatic negative GI ROS,   Endo/Other    Renal/GU      Musculoskeletal   Abdominal   Peds  Hematology   Anesthesia Other Findings   Reproductive/Obstetrics                             Anesthesia Physical Anesthesia Plan  ASA: III  Anesthesia Plan: General   Post-op Pain Management:    Induction: Intravenous  Airway Management Planned: LMA  Additional Equipment:   Intra-op Plan:   Post-operative Plan: Extubation in OR  Informed Consent: I have reviewed the patients History and Physical, chart, labs and discussed the procedure including the risks, benefits and alternatives for the proposed anesthesia with the patient or authorized representative who has indicated his/her understanding and acceptance.     Plan Discussed with:   Anesthesia Plan Comments:         Anesthesia Quick Evaluation

## 2014-12-18 NOTE — Transfer of Care (Signed)
Immediate Anesthesia Transfer of Care Note  Patient: Cory Roberts  Procedure(s) Performed: Procedure(s): INCISION AND DRAINAGE THUMB (Right)  Patient Location: PACU  Anesthesia Type:General  Level of Consciousness: sedated and patient cooperative  Airway & Oxygen Therapy: Patient Spontanous Breathing and non-rebreather face mask  Post-op Assessment: Report given to RN, Post -op Vital signs reviewed and stable and Patient moving all extremities  Post vital signs: Reviewed and stable  L  Complications: No apparent anesthesia complications

## 2014-12-18 NOTE — Anesthesia Procedure Notes (Signed)
Procedure Name: Intubation Date/Time: 12/18/2014 11:28 AM Performed by: Franco NonesYATES, Kairyn Olmeda S Pre-anesthesia Checklist: Patient identified, Patient being monitored, Timeout performed, Emergency Drugs available and Suction available Patient Re-evaluated:Patient Re-evaluated prior to inductionOxygen Delivery Method: Circle System Utilized Preoxygenation: Pre-oxygenation with 100% oxygen Intubation Type: IV induction Ventilation: Mask ventilation without difficulty Laryngoscope Size: Miller and 2 Grade View: Grade I Tube type: Oral Tube size: 7.0 mm Number of attempts: 1 Airway Equipment and Method: Stylet and Oral airway Placement Confirmation: ETT inserted through vocal cords under direct vision,  positive ETCO2 and breath sounds checked- equal and bilateral Secured at: 22 cm Tube secured with: Tape Dental Injury: Teeth and Oropharynx as per pre-operative assessment  Comments: Attempted insertion 4 LMA. Resistance felt. Head repositioned. Attempted insertion again, resistance felt. Decision made to intubate. 0

## 2014-12-18 NOTE — Progress Notes (Signed)
ANTIBIOTIC CONSULT NOTE  Pharmacy Consult for Vancomycin Indication: cellulitis / wound infection  Allergies  Allergen Reactions  . Codeine Nausea And Vomiting  . Hydrocodone Nausea Only    Patient Measurements: Height: 5\' 8"  (172.7 cm) Weight: 180 lb (81.647 kg) IBW/kg (Calculated) : 68.4  Vital Signs: Temp: 97.9 F (36.6 C) (11/18 0952) Temp Source: Oral (11/18 0952) BP: 97/66 mmHg (11/18 0631) Pulse Rate: 76 (11/18 0952) Intake/Output from previous day: 11/17 0701 - 11/18 0700 In: 480 [P.O.:480] Out: -  Intake/Output from this shift:    Labs:  Recent Labs  12/15/14 1943 12/16/14 0650 12/17/14 0919 12/18/14 0657  WBC 12.2* 12.0* 7.0  --   HGB 13.9 12.7* 13.4  --   PLT 244 218 239  --   CREATININE 1.07 0.83 0.91 0.97   Estimated Creatinine Clearance: 82.3 mL/min (by C-G formula based on Cr of 0.97).  Recent Labs  12/18/14 0920  [facility]     Microbiology: Recent Results (from the past 720 hour(s))  Culture, routine-abscess     Status: None (Preliminary result)   Collection Time: 12/15/14  8:56 PM  Result Value Ref Range Status   Specimen Description ABSCESS RIGHT THUMB  Final   Special Requests NONE  Final   Gram Stain   Final    RARE WBC PRESENT, PREDOMINANTLY MONONUCLEAR NO SQUAMOUS EPITHELIAL CELLS SEEN NO ORGANISMS SEEN Performed at [facility]    Culture   Final    ABUNDANT STAPHYLOCOCCUS AUREUS Note: RIFAMPIN AND GENTAMICIN SHOULD NOT BE USED AS SINGLE DRUGS FOR TREATMENT OF STAPH INFECTIONS. Performed at [facility]    Report Status PENDING  Incomplete  Culture, blood (routine x 2)     Status: None (Preliminary result)   Collection Time: 12/16/14 12:30 AM  Result Value Ref Range Status   Specimen Description BLOOD LEFT HAND  Final   Special Requests BOTTLES DRAWN AEROBIC AND ANAEROBIC 8CC EACH  Final   Culture NO GROWTH 2 DAYS  Final   Report Status PENDING  Incomplete  Culture, blood (routine x 2)     Status:  None (Preliminary result)   Collection Time: 12/16/14 12:45 AM  Result Value Ref Range Status   Specimen Description BLOOD LEFT HAND  Final   Special Requests BOTTLES DRAWN AEROBIC AND ANAEROBIC 6CC EACH  Final   Culture NO GROWTH 2 DAYS  Final   Report Status PENDING  Incomplete  Surgical pcr screen     Status: Abnormal   Collection Time: 12/17/14  4:00 PM  Result Value Ref Range Status   MRSA, PCR NEGATIVE NEGATIVE Final   Staphylococcus aureus POSITIVE (A) NEGATIVE Final    Comment: RESULT CALLED TO, READ BACK BY AND VERIFIED WITH: PATTEN,M AT 2032 ON 12/17/2014 BY ISLEY,B        The Xpert SA Assay (FDA approved for NASAL specimens in patients over 921 years of age), is one component of a comprehensive surveillance program.  Test performance has been validated by [facility] for patients greater than or equal to 56 year old. It is not intended to diagnose infection nor to guide or monitor treatment.     Anti-infectives    Start     Dose/Rate Route Frequency Ordered Stop   12/16/14 1000  vancomycin (VANCOCIN) IVPB 1000 mg/200 mL premix     1,000 mg 200 mL/hr over 60 Minutes Intravenous Every 12 hours 12/15/14 2301     12/15/14 2115  vancomycin (VANCOCIN) IVPB 1000 mg/200 mL premix  1,000 mg 200 mL/hr over 60 Minutes Intravenous  Once 12/15/14 2111 12/15/14 2226     Assessment: 54yo male admitted with cellulitis of right thumb.  Purulent drainage noted when admitted.  Vancomycin trough level is acceptable.  Cx pending.  Currently afebrile, renal fxn stable.    Goal of Therapy:  Vancomycin trough level 10-15 mcg/ml  Plan:  Continue Vancomycin 1gm IV every 12 hours. Measure antibiotic drug levels at steady state Follow up culture results  Valrie Hart A 12/18/2014,10:28 AM

## 2014-12-18 NOTE — Brief Op Note (Signed)
12/15/2014 - 12/18/2014  12:01 PM  PATIENT:  Cory Roberts  54 y.o. male  PRE-OPERATIVE DIAGNOSIS:  INFECTION RIGHT THUMB  POST-OPERATIVE DIAGNOSIS:  INFECTION RIGHT THUMB  PROCEDURE:  Procedure(s): INCISION AND DRAINAGE THUMB (Right)   Surgical findings purulent material was noted down to the extensor tendon and musculature of the first extensor compartment. The wound was opened up to 5 cm to allow adequate drainage and then undermining of the soft tissues until of pus was evacuated.  Procedure details chart review site confirmation right thumb marked confirmed. Patient surgery general anesthesia. Betadine prep and sterile drape.  Puncture wound from the ER was extended onto the fascia up to the distal aspect of the first metacarpal and across the IP joint of the thumb. Purulent material was evacuated with undermining of the soft tissues as needed. Thorough saline irrigation was performed with bulb syringe. We debrided skin and subcutaneous tissue. We did not debride muscle.  This was sharply excised with the scalpel for the debridement. Scissors were used to trim edges and make a smooth wound.  Wound was packed with iodoform gauze. Followed by 4 x 4's followed by 2 circular gauze and then an Ace wrap  Digital block was performed with the Marcaine  Circumferentially and in the first webspace, this was done prior to the dressing.  Sterile dressing applied SURGEON:  Surgeon(s) and Role:    * Stanley E Harrison, MD - Primary  PHYSICIAN ASSISTANT:   ASSISTANTS: none   ANESTHESIA:   general  EBL:  Total I/O In: 700 [I.V.:700] Out: 0   BLOOD ADMINISTERED:none  DRAINS: none   LOCAL MEDICATIONS USED:  MARCAINE    and Amount: 20 ml  SPECIMEN:  Source of Specimen:  Cultures of the wound anaerobic aerobic  DISPOSITION OF SPECIMEN:  Microbiology culture specimen right thumb wound  COUNTS:  YES  TOURNIQUET:    DICTATION: .Dragon Dictation  PLAN OF CARE: Admit to inpatient    PATIENT DISPOSITION:  PACU - hemodynamically stable.   Delay start of Pharmacological VTE agent (>24hrs) due to surgical blood loss or risk of bleeding: not applicable 

## 2014-12-19 MED ORDER — ALUM & MAG HYDROXIDE-SIMETH 200-200-20 MG/5ML PO SUSP
30.0000 mL | ORAL | Status: DC | PRN
  Administered 2014-12-19: 30 mL via ORAL
  Filled 2014-12-19: qty 30

## 2014-12-19 NOTE — Addendum Note (Signed)
Addendum  created 12/19/14 1352 by Despina Hiddenobert J Yossef Gilkison, CRNA   Modules edited: Notes Section   Notes Section:  File: [id number]

## 2014-12-19 NOTE — Progress Notes (Signed)
[facility] PROGRESS NOTE  Cory Roberts WUJ:[id number] DOB: 06/28/1960 DOA: 12/15/2014 PCP: Cassell SmilesFUSCO,LAWRENCE J., MD  Assessment/Plan: Right Thumb Abscess/Cellulitis -S/p I and D in OR 11/18. -Appreciate ortho input and recommendations. -Per patient there are plans to return to OR. -Continue vancomycin. -Wound cx collected in ED with MRSA, multiple PO options. -Continue PRN pain management.  Hypokalemia -Repleted. -Mg ok at 1.9.  Leukocytosis -2/2 above. -Resolved with antibiotics.  Code Status: Full Code Family Communication: Wife and daughter at bedside Disposition Plan: As per ortho   Consultants:  Ortho   Antibiotics:  Vancomycin   Subjective: No complaints.  Objective: Filed Vitals:   12/18/14 1420 12/18/14 1554 12/18/14 2240 12/19/14 0705  BP: 110/75 111/71 111/58 116/55  Pulse: 75 68 77 63  Temp: 97.4 F (36.3 C) 97.3 F (36.3 C) 97.9 F (36.6 C) 97.8 F (36.6 C)  TempSrc: Oral Oral Oral Oral  Resp: 18 18 18 18   Height:      Weight:      SpO2: 97% 97% 99% 99%    Intake/Output Summary (Last 24 hours) at 12/19/14 1516 Last data filed at 12/18/14 1700  Gross per 24 hour  Intake    120 ml  Output      0 ml  Net    120 ml   Filed Weights   12/15/14 1636 12/15/14 2247 12/18/14 0952  Weight: 78.926 kg (174 lb) 81.7 kg (180 lb 1.9 oz) 81.647 kg (180 lb)    Exam:   General:  AA ox3  Cardiovascular: RRR  Respiratory: CTA B  Abdomen: S/NT/ND/+BS  Extremities: no C/C/E, right thumb wrapped in clean dressing. Streaky erythema resolved.  Neurologic:  Grossly intact and non-focal  Data Reviewed: Basic Metabolic Panel:  Recent Labs Lab 12/15/14 1943 12/16/14 0650 12/17/14 0919 12/17/14 1548 12/18/14 0657  NA 137 136 137  --  138  K 3.4* 3.8 3.2*  --  4.3  CL 101 104 105  --  107  CO2 25 25 26   --  26  GLUCOSE 97 112* 160*  --  105*  BUN 21* 16 16  --  15  CREATININE 1.07 0.83 0.91  --  0.97  CALCIUM 9.2 8.7* 8.7*  --   9.1  MG  --   --   --  1.9  --    Liver Function Tests: No results for input(s): AST, ALT, ALKPHOS, BILITOT, PROT, ALBUMIN in the last 168 hours. No results for input(s): LIPASE, AMYLASE in the last 168 hours. No results for input(s): AMMONIA in the last 168 hours. CBC:  Recent Labs Lab 12/15/14 1943 12/16/14 0650 12/17/14 0919  WBC 12.2* 12.0* 7.0  NEUTROABS 9.4*  --  4.9  HGB 13.9 12.7* 13.4  HCT 39.9 37.2* 39.4  MCV 97.1 97.1 98.0  PLT 244 218 239   Cardiac Enzymes: No results for input(s): CKTOTAL, CKMB, CKMBINDEX, TROPONINI in the last 168 hours. BNP (last 3 results) No results for input(s): BNP in the last 8760 hours.  ProBNP (last 3 results) No results for input(s): PROBNP in the last 8760 hours.  CBG: No results for input(s): GLUCAP in the last 168 hours.  Recent Results (from the past 240 hour(s))  Culture, routine-abscess     Status: None   Collection Time: 12/15/14  8:56 PM  Result Value Ref Range Status   Specimen Description ABSCESS RIGHT THUMB  Final   Special Requests NONE  Final   Gram Stain   Final  RARE WBC PRESENT, PREDOMINANTLY MONONUCLEAR NO SQUAMOUS EPITHELIAL CELLS SEEN NO ORGANISMS SEEN Performed at [facility]    Culture   Final    ABUNDANT METHICILLIN RESISTANT STAPHYLOCOCCUS AUREUS Note: RIFAMPIN AND GENTAMICIN SHOULD NOT BE USED AS SINGLE DRUGS FOR TREATMENT OF STAPH INFECTIONS. This organism DOES NOT demonstrate inducible Clindamycin resistance in vitro. CRITICAL RESULT CALLED TO, READ BACK BY AND VERIFIED WITH: TAMEEKA WATKINS @  1049 on [id number] BY [facility] Performed at [facility]    Report Status 12/18/2014 FINAL  Final   Organism ID, Bacteria METHICILLIN RESISTANT STAPHYLOCOCCUS AUREUS  Final      Susceptibility   Methicillin resistant staphylococcus aureus - MIC*    CLINDAMYCIN <=0.25 SENSITIVE Sensitive     ERYTHROMYCIN >=8 RESISTANT Resistant     GENTAMICIN <=0.5 SENSITIVE Sensitive     LEVOFLOXACIN 4  INTERMEDIATE Intermediate     OXACILLIN >=4 RESISTANT Resistant     RIFAMPIN <=0.5 SENSITIVE Sensitive     TRIMETH/SULFA <=10 SENSITIVE Sensitive     VANCOMYCIN <=0.5 SENSITIVE Sensitive     TETRACYCLINE <=1 SENSITIVE Sensitive     * ABUNDANT METHICILLIN RESISTANT STAPHYLOCOCCUS AUREUS  Culture, blood (routine x 2)     Status: None (Preliminary result)   Collection Time: 12/16/14 12:30 AM  Result Value Ref Range Status   Specimen Description BLOOD LEFT HAND  Final   Special Requests BOTTLES DRAWN AEROBIC AND ANAEROBIC 8CC EACH  Final   Culture NO GROWTH 3 DAYS  Final   Report Status PENDING  Incomplete  Culture, blood (routine x 2)     Status: None (Preliminary result)   Collection Time: 12/16/14 12:45 AM  Result Value Ref Range Status   Specimen Description BLOOD LEFT HAND  Final   Special Requests BOTTLES DRAWN AEROBIC AND ANAEROBIC 6CC EACH  Final   Culture NO GROWTH 3 DAYS  Final   Report Status PENDING  Incomplete  Surgical pcr screen     Status: Abnormal   Collection Time: 12/17/14  4:00 PM  Result Value Ref Range Status   MRSA, PCR NEGATIVE NEGATIVE Final   Staphylococcus aureus POSITIVE (A) NEGATIVE Final    Comment: RESULT CALLED TO, READ BACK BY AND VERIFIED WITH: PATTEN,M AT 2032 ON 12/17/2014 BY ISLEY,B        The Xpert SA Assay (FDA approved for NASAL specimens in patients over 60 years of age), is one component of a comprehensive surveillance program.  Test performance has been validated by [facility] for patients greater than or equal to 19 year old. It is not intended to diagnose infection nor to guide or monitor treatment.   Wound culture     Status: None (Preliminary result)   Collection Time: 12/18/14 10:55 AM  Result Value Ref Range Status   Specimen Description THUMB RIGHT  Final   Special Requests NONE  Final   Gram Stain   Final    FEW WBC PRESENT,BOTH PMN AND MONONUCLEAR NO SQUAMOUS EPITHELIAL CELLS SEEN NO ORGANISMS SEEN Performed at [facility]    Culture PENDING  Incomplete   Report Status PENDING  Incomplete  Anaerobic culture     Status: None (Preliminary result)   Collection Time: 12/18/14 10:55 AM  Result Value Ref Range Status   Specimen Description THUMB RIGHT  Final   Special Requests NONE  Final   Gram Stain   Final    FEW WBC PRESENT,BOTH PMN AND MONONUCLEAR NO SQUAMOUS EPITHELIAL CELLS SEEN NO ORGANISMS SEEN Performed at  [facility]    Culture PENDING  Incomplete   Report Status PENDING  Incomplete     Studies: No results found.  Scheduled Meds: . aspirin  325 mg Oral Daily  . docusate sodium  100 mg Oral BID  . potassium chloride  40 mEq Oral Daily  . vancomycin  1,000 mg Intravenous Q12H   Continuous Infusions: . sodium chloride 1,000 mL (12/17/14 1859)  . sodium chloride 75 mL/hr (12/18/14 1625)    Principal Problem:   Abscess of thumb Active Problems:   Obesity   Dyslipidemia   At risk for diabetes mellitus   Cellulitis and abscess   Abscess of right thumb    Time spent: 15 minutes. Greater than 50% of this time was spent in direct contact with the patient coordinating care.    Chaya Jan  [facility] Pager [phone]  If 7PM-7AM, please contact night-coverage at [email or web address], password [facility] 12/19/2014, 3:16 PM  LOS: 4 days

## 2014-12-19 NOTE — Anesthesia Postprocedure Evaluation (Signed)
  Anesthesia Post-op Note  Patient: Cory Roberts  Procedure(s) Performed: Procedure(s): INCISION AND DRAINAGE THUMB (Right)  Patient Location: room 331  Anesthesia Type:General  Level of Consciousness: awake, alert , oriented and patient cooperative  Airway and Oxygen Therapy: Patient Spontanous Breathing  Post-op Pain: 6 /10, moderate  Post-op Assessment: Post-op Vital signs reviewed, Patient's Cardiovascular Status Stable, Respiratory Function Stable, Patent Airway, No signs of Nausea or vomiting, Adequate PO intake and Pain level not controlled              Post-op Vital Signs: Reviewed and stable  Last Vitals:  Filed Vitals:   12/19/14 0705  BP: 116/55  Pulse: 63  Temp: 36.6 C  Resp: 18    Complications: No apparent anesthesia complications

## 2014-12-20 NOTE — Progress Notes (Signed)
Updated the patient on the POC for tomorrow.  Voiced to him I had talked with Dr. Romeo AppleHarrison and he plans to take the OR to do th e dressing change for the patient.  I also let him know that I spoke with Dr. Jena Gaussourk and he stated that they would see him in the am.  On both occassions the patient is to be NPO except for meds after midnite.  He verbalized understanding.

## 2014-12-20 NOTE — Progress Notes (Signed)
Spoke with Dr. Jena Gaussourk about the patient.  Verbalized to MD that the patient was only on ASA and not any Lovenox or heparin and that the patient c/o having something stuck in his throat.  Voiced to MD that the patient is able to tolerate swallowing his medication and food.  New orders given and followed.

## 2014-12-20 NOTE — Progress Notes (Signed)
[facility] PROGRESS NOTE  Cory Roberts L Bezanson WUJ:[id number] DOB: April 20, 1960 DOA: 12/15/2014 PCP: Cassell SmilesFUSCO,LAWRENCE J., MD  Assessment/Plan: Right Thumb Abscess/Cellulitis -S/p I and D in OR 11/18. -Appreciate ortho input and recommendations. -Per patient there are plans to return to OR. -Continue vancomycin. -Wound cx collected in ED with MRSA, multiple PO options. -Continue PRN pain management.  Dysphagia -He c/o food getting hung in throat. -Especially with large chunks. -Advised to sit vertically to eat and follow each bite with a sip of fluids. -Will request GI consult, to see if any role for EGD with potential dilatation.   Hypokalemia -Repleted. -Mg ok at 1.9.  Leukocytosis -2/2 above. -Resolved with antibiotics.  Code Status: Full Code Family Communication: son and DIL at bedside updated on plan of care. Disposition Plan: As per ortho   Consultants:  Ortho   Antibiotics:  Vancomycin   Subjective: Mild pain and edema of the fingers of the right hand. C/o food getting hung mid-esophagus when swallowing.  Objective: Filed Vitals:   12/19/14 0705 12/19/14 1553 12/19/14 2125 12/20/14 0630  BP: 116/55 120/76 107/66 101/66  Pulse: 63 63 68 60  Temp: 97.8 F (36.6 C) 97.4 F (36.3 C) 98 F (36.7 C) 88.9 F (31.6 C)  TempSrc: Oral  Oral Oral  Resp: 18 18 18 18   Height:      Weight:      SpO2: 99% 98% 98% 100%    Intake/Output Summary (Last 24 hours) at 12/20/14 1119 Last data filed at 12/20/14 0601  Gross per 24 hour  Intake   3500 ml  Output      0 ml  Net   3500 ml   Filed Weights   12/15/14 1636 12/15/14 2247 12/18/14 0952  Weight: 78.926 kg (174 lb) 81.7 kg (180 lb 1.9 oz) 81.647 kg (180 lb)    Exam:   General:  AA ox3  Cardiovascular: RRR  Respiratory: CTA B  Abdomen: S/NT/ND/+BS  Extremities: no C/C/E, right thumb wrapped in clean dressing. Streaky erythema resolved.  Neurologic:  Grossly intact and non-focal  Data  Reviewed: Basic Metabolic Panel:  Recent Labs Lab 12/15/14 1943 12/16/14 0650 12/17/14 0919 12/17/14 1548 12/18/14 0657  NA 137 136 137  --  138  K 3.4* 3.8 3.2*  --  4.3  CL 101 104 105  --  107  CO2 25 25 26   --  26  GLUCOSE 97 112* 160*  --  105*  BUN 21* 16 16  --  15  CREATININE 1.07 0.83 0.91  --  0.97  CALCIUM 9.2 8.7* 8.7*  --  9.1  MG  --   --   --  1.9  --    Liver Function Tests: No results for input(s): AST, ALT, ALKPHOS, BILITOT, PROT, ALBUMIN in the last 168 hours. No results for input(s): LIPASE, AMYLASE in the last 168 hours. No results for input(s): AMMONIA in the last 168 hours. CBC:  Recent Labs Lab 12/15/14 1943 12/16/14 0650 12/17/14 0919  WBC 12.2* 12.0* 7.0  NEUTROABS 9.4*  --  4.9  HGB 13.9 12.7* 13.4  HCT 39.9 37.2* 39.4  MCV 97.1 97.1 98.0  PLT 244 218 239   Cardiac Enzymes: No results for input(s): CKTOTAL, CKMB, CKMBINDEX, TROPONINI in the last 168 hours. BNP (last 3 results) No results for input(s): BNP in the last 8760 hours.  ProBNP (last 3 results) No results for input(s): PROBNP in the last 8760 hours.  CBG: No results for input(s):  GLUCAP in the last 168 hours.  Recent Results (from the past 240 hour(s))  Culture, routine-abscess     Status: None   Collection Time: 12/15/14  8:56 PM  Result Value Ref Range Status   Specimen Description ABSCESS RIGHT THUMB  Final   Special Requests NONE  Final   Gram Stain   Final    RARE WBC PRESENT, PREDOMINANTLY MONONUCLEAR NO SQUAMOUS EPITHELIAL CELLS SEEN NO ORGANISMS SEEN Performed at [facility]    Culture   Final    ABUNDANT METHICILLIN RESISTANT STAPHYLOCOCCUS AUREUS Note: RIFAMPIN AND GENTAMICIN SHOULD NOT BE USED AS SINGLE DRUGS FOR TREATMENT OF STAPH INFECTIONS. This organism DOES NOT demonstrate inducible Clindamycin resistance in vitro. CRITICAL RESULT CALLED TO, READ BACK BY AND VERIFIED WITH: TAMEEKA WATKINS @  1049 on [id number] BY [facility] Performed at [facility]    Report Status 12/18/2014 FINAL  Final   Organism ID, Bacteria METHICILLIN RESISTANT STAPHYLOCOCCUS AUREUS  Final      Susceptibility   Methicillin resistant staphylococcus aureus - MIC*    CLINDAMYCIN <=0.25 SENSITIVE Sensitive     ERYTHROMYCIN >=8 RESISTANT Resistant     GENTAMICIN <=0.5 SENSITIVE Sensitive     LEVOFLOXACIN 4 INTERMEDIATE Intermediate     OXACILLIN >=4 RESISTANT Resistant     RIFAMPIN <=0.5 SENSITIVE Sensitive     TRIMETH/SULFA <=10 SENSITIVE Sensitive     VANCOMYCIN <=0.5 SENSITIVE Sensitive     TETRACYCLINE <=1 SENSITIVE Sensitive     * ABUNDANT METHICILLIN RESISTANT STAPHYLOCOCCUS AUREUS  Culture, blood (routine x 2)     Status: None (Preliminary result)   Collection Time: 12/16/14 12:30 AM  Result Value Ref Range Status   Specimen Description BLOOD LEFT HAND  Final   Special Requests BOTTLES DRAWN AEROBIC AND ANAEROBIC 8CC EACH  Final   Culture NO GROWTH 4 DAYS  Final   Report Status PENDING  Incomplete  Culture, blood (routine x 2)     Status: None (Preliminary result)   Collection Time: 12/16/14 12:45 AM  Result Value Ref Range Status   Specimen Description BLOOD LEFT HAND  Final   Special Requests BOTTLES DRAWN AEROBIC AND ANAEROBIC 6CC EACH  Final   Culture NO GROWTH 4 DAYS  Final   Report Status PENDING  Incomplete  Surgical pcr screen     Status: Abnormal   Collection Time: 12/17/14  4:00 PM  Result Value Ref Range Status   MRSA, PCR NEGATIVE NEGATIVE Final   Staphylococcus aureus POSITIVE (A) NEGATIVE Final    Comment: RESULT CALLED TO, READ BACK BY AND VERIFIED WITH: PATTEN,M AT 2032 ON 12/17/2014 BY ISLEY,B        The Xpert SA Assay (FDA approved for NASAL specimens in patients over 53 years of age), is one component of a comprehensive surveillance program.  Test performance has been validated by [facility] for patients greater than or equal to 64 year old. It is not intended to diagnose infection nor to guide or monitor  treatment.   Wound culture     Status: None (Preliminary result)   Collection Time: 12/18/14 10:55 AM  Result Value Ref Range Status   Specimen Description THUMB RIGHT  Final   Special Requests NONE  Final   Gram Stain   Final    FEW WBC PRESENT,BOTH PMN AND MONONUCLEAR NO SQUAMOUS EPITHELIAL CELLS SEEN NO ORGANISMS SEEN Performed at [facility]    Culture   Final    MODERATE STAPHYLOCOCCUS AUREUS Note: RIFAMPIN AND  GENTAMICIN SHOULD NOT BE USED AS SINGLE DRUGS FOR TREATMENT OF STAPH INFECTIONS. Performed at [facility]    Report Status PENDING  Incomplete  Anaerobic culture     Status: None (Preliminary result)   Collection Time: 12/18/14 10:55 AM  Result Value Ref Range Status   Specimen Description THUMB RIGHT  Final   Special Requests NONE  Final   Gram Stain   Final    FEW WBC PRESENT,BOTH PMN AND MONONUCLEAR NO SQUAMOUS EPITHELIAL CELLS SEEN NO ORGANISMS SEEN Performed at [facility]    Culture   Final    NO ANAEROBES ISOLATED; CULTURE IN PROGRESS FOR 5 DAYS Performed at [facility]    Report Status PENDING  Incomplete     Studies: No results found.  Scheduled Meds: . aspirin  325 mg Oral Daily  . docusate sodium  100 mg Oral BID  . potassium chloride  40 mEq Oral Daily  . vancomycin  1,000 mg Intravenous Q12H   Continuous Infusions: . sodium chloride 1,000 mL (12/17/14 1859)  . sodium chloride 75 mL/hr at 12/19/14 2128    Principal Problem:   Abscess of thumb Active Problems:   Obesity   Dyslipidemia   At risk for diabetes mellitus   Cellulitis and abscess   Abscess of right thumb    Time spent: 25 minutes. Greater than 50% of this time was spent in direct contact with the patient coordinating care.    Chaya Jan  [facility] Pager [phone]  If 7PM-7AM, please contact night-coverage at [email or web address], password [facility] 12/20/2014, 11:19 AM  LOS: 5 days

## 2014-12-21 ENCOUNTER — Encounter (HOSPITAL_COMMUNITY): Payer: Self-pay | Admitting: Gastroenterology

## 2014-12-21 ENCOUNTER — Inpatient Hospital Stay (HOSPITAL_COMMUNITY): Payer: BLUE CROSS/BLUE SHIELD | Admitting: Anesthesiology

## 2014-12-21 ENCOUNTER — Encounter (HOSPITAL_COMMUNITY)
Admission: EM | Disposition: A | Discharge status: Home Health | Payer: Self-pay | Source: Home / Self Care | Attending: Internal Medicine

## 2014-12-21 DIAGNOSIS — R1310 Dysphagia, unspecified: Secondary | ICD-10-CM | POA: Insufficient documentation

## 2014-12-21 HISTORY — PX: INCISION AND DRAINAGE: SHX5863

## 2014-12-21 LAB — WOUND CULTURE

## 2014-12-21 LAB — CBC
HCT: 39.2 % (ref 39.0–52.0)
Hemoglobin: 13.3 g/dL (ref 13.0–17.0)
MCH: 32.8 pg (ref 26.0–34.0)
MCHC: 33.9 g/dL (ref 30.0–36.0)
MCV: 96.8 fL (ref 78.0–100.0)
PLATELETS: 323 10*3/uL (ref 150–400)
RBC: 4.05 MIL/uL — AB (ref 4.22–5.81)
RDW: 11.3 % — ABNORMAL LOW (ref 11.5–15.5)
WBC: 6.1 10*3/uL (ref 4.0–10.5)

## 2014-12-21 LAB — BASIC METABOLIC PANEL
Anion gap: 5 (ref 5–15)
BUN: 16 mg/dL (ref 6–20)
CALCIUM: 8.9 mg/dL (ref 8.9–10.3)
CO2: 29 mmol/L (ref 22–32)
CREATININE: 0.99 mg/dL (ref 0.61–1.24)
Chloride: 102 mmol/L (ref 101–111)
GFR calc Af Amer: >60 mL/min (ref >60)
Glucose, Bld: 98 mg/dL (ref 65–99)
POTASSIUM: 3.6 mmol/L (ref 3.5–5.1)
SODIUM: 136 mmol/L (ref 135–145)

## 2014-12-21 LAB — CULTURE, BLOOD (ROUTINE X 2)
CULTURE: NO GROWTH
Culture: NO GROWTH

## 2014-12-21 SURGERY — INCISION AND DRAINAGE
Anesthesia: General | Site: Thumb | Laterality: Right

## 2014-12-21 MED ORDER — ONDANSETRON HCL 4 MG/2ML IJ SOLN
INTRAMUSCULAR | Status: AC
  Filled 2014-12-21: qty 2

## 2014-12-21 MED ORDER — NEOSTIGMINE METHYLSULFATE 10 MG/10ML IV SOLN
INTRAVENOUS | Status: DC | PRN
  Administered 2014-12-21: 4 mg via INTRAVENOUS
  Administered 2014-12-21: 2 mg via INTRAVENOUS

## 2014-12-21 MED ORDER — SODIUM CHLORIDE 0.9 % IR SOLN
Status: DC | PRN
  Administered 2014-12-21: 1000 mL

## 2014-12-21 MED ORDER — NEOSTIGMINE METHYLSULFATE 10 MG/10ML IV SOLN
INTRAVENOUS | Status: AC
  Filled 2014-12-21: qty 4

## 2014-12-21 MED ORDER — PROPOFOL 10 MG/ML IV BOLUS
INTRAVENOUS | Status: AC
  Filled 2014-12-21: qty 20

## 2014-12-21 MED ORDER — MIDAZOLAM HCL 2 MG/2ML IJ SOLN
1.0000 mg | INTRAMUSCULAR | Status: DC | PRN
  Administered 2014-12-21: 2 mg via INTRAVENOUS

## 2014-12-21 MED ORDER — ONDANSETRON HCL 4 MG/2ML IJ SOLN
4.0000 mg | Freq: Once | INTRAMUSCULAR | Status: AC | PRN
  Administered 2014-12-21: 4 mg via INTRAVENOUS
  Filled 2014-12-21: qty 2

## 2014-12-21 MED ORDER — FENTANYL CITRATE (PF) 100 MCG/2ML IJ SOLN
INTRAMUSCULAR | Status: AC
  Filled 2014-12-21: qty 2

## 2014-12-21 MED ORDER — METOPROLOL TARTRATE 1 MG/ML IV SOLN
INTRAVENOUS | Status: AC
  Filled 2014-12-21: qty 5

## 2014-12-21 MED ORDER — SODIUM CHLORIDE 0.9 % IV SOLN
INTRAVENOUS | Status: DC
  Administered 2014-12-22: 05:00:00 via INTRAVENOUS

## 2014-12-21 MED ORDER — SODIUM CHLORIDE 0.9 % IV SOLN
INTRAVENOUS | Status: DC
Start: 2014-12-21 — End: 2014-12-21

## 2014-12-21 MED ORDER — SCOPOLAMINE 1 MG/3DAYS TD PT72
MEDICATED_PATCH | TRANSDERMAL | Status: AC
  Filled 2014-12-21: qty 1

## 2014-12-21 MED ORDER — PROPOFOL 500 MG/50ML IV EMUL
INTRAVENOUS | Status: DC | PRN
Start: 2014-12-21 — End: 2014-12-21

## 2014-12-21 MED ORDER — LACTATED RINGERS IV SOLN
INTRAVENOUS | Status: DC
  Administered 2014-12-21: 12:00:00 via INTRAVENOUS

## 2014-12-21 MED ORDER — SODIUM CHLORIDE 0.9 % IV SOLN: 250.0000 mL | INTRAVENOUS | Status: DC | PRN

## 2014-12-21 MED ORDER — LIDOCAINE HCL (PF) 1 % IJ SOLN
INTRAMUSCULAR | Status: AC
  Filled 2014-12-21: qty 10

## 2014-12-21 MED ORDER — FENTANYL CITRATE (PF) 100 MCG/2ML IJ SOLN
INTRAMUSCULAR | Status: DC | PRN
  Administered 2014-12-21 (×2): 50 ug via INTRAVENOUS

## 2014-12-21 MED ORDER — ROCURONIUM BROMIDE 100 MG/10ML IV SOLN
INTRAVENOUS | Status: DC | PRN
  Administered 2014-12-21: 5 mg via INTRAVENOUS
  Administered 2014-12-21: 25 mg via INTRAVENOUS

## 2014-12-21 MED ORDER — ONDANSETRON HCL 4 MG/2ML IJ SOLN
4.0000 mg | Freq: Once | INTRAMUSCULAR | Status: AC
  Administered 2014-12-21: 4 mg via INTRAVENOUS

## 2014-12-21 MED ORDER — SODIUM CHLORIDE 0.9 % IJ SOLN
INTRAMUSCULAR | Status: AC
  Filled 2014-12-21: qty 10

## 2014-12-21 MED ORDER — SCOPOLAMINE 1 MG/3DAYS TD PT72
1.0000 | MEDICATED_PATCH | TRANSDERMAL | Status: DC
  Administered 2014-12-21: 1.5 mg via TRANSDERMAL

## 2014-12-21 MED ORDER — LIDOCAINE HCL 1 % IJ SOLN
INTRAMUSCULAR | Status: DC | PRN
  Administered 2014-12-21: 50 mg via INTRADERMAL

## 2014-12-21 MED ORDER — FENTANYL CITRATE (PF) 100 MCG/2ML IJ SOLN: 25.0000 ug | INTRAMUSCULAR | Status: DC | PRN

## 2014-12-21 MED ORDER — DEXAMETHASONE SODIUM PHOSPHATE 4 MG/ML IJ SOLN
INTRAMUSCULAR | Status: AC
  Filled 2014-12-21: qty 1

## 2014-12-21 MED ORDER — BUPIVACAINE HCL (PF) 0.5 % IJ SOLN
INTRAMUSCULAR | Status: AC
  Filled 2014-12-21: qty 30

## 2014-12-21 MED ORDER — GLYCOPYRROLATE 0.2 MG/ML IJ SOLN
INTRAMUSCULAR | Status: AC
  Filled 2014-12-21: qty 4

## 2014-12-21 MED ORDER — CHLORHEXIDINE GLUCONATE 4 % EX LIQD: 60.0000 mL | Freq: Once | CUTANEOUS | Status: DC

## 2014-12-21 MED ORDER — MIDAZOLAM HCL 2 MG/2ML IJ SOLN
INTRAMUSCULAR | Status: AC
  Filled 2014-12-21: qty 2

## 2014-12-21 MED ORDER — ROCURONIUM BROMIDE 50 MG/5ML IV SOLN
INTRAVENOUS | Status: AC
  Filled 2014-12-21: qty 2

## 2014-12-21 MED ORDER — SODIUM CHLORIDE 0.9 % IJ SOLN: 3.0000 mL | INTRAMUSCULAR | Status: DC | PRN

## 2014-12-21 MED ORDER — FENTANYL CITRATE (PF) 100 MCG/2ML IJ SOLN
25.0000 ug | INTRAMUSCULAR | Status: AC
  Administered 2014-12-21 (×2): 25 ug via INTRAVENOUS

## 2014-12-21 MED ORDER — PROPOFOL 10 MG/ML IV BOLUS
INTRAVENOUS | Status: DC | PRN
  Administered 2014-12-21: 150 mg via INTRAVENOUS

## 2014-12-21 MED ORDER — SUCCINYLCHOLINE CHLORIDE 20 MG/ML IJ SOLN
INTRAMUSCULAR | Status: AC
  Filled 2014-12-21: qty 2

## 2014-12-21 MED ORDER — GLYCOPYRROLATE 0.2 MG/ML IJ SOLN
INTRAMUSCULAR | Status: DC | PRN
  Administered 2014-12-21: .8 mg via INTRAVENOUS
  Administered 2014-12-21: .4 mg via INTRAVENOUS

## 2014-12-21 MED ORDER — SODIUM CHLORIDE 0.9 % IJ SOLN
3.0000 mL | Freq: Two times a day (BID) | INTRAMUSCULAR | Status: DC
  Administered 2014-12-21 – 2014-12-23 (×3): 3 mL via INTRAVENOUS

## 2014-12-21 MED ORDER — BUPIVACAINE HCL (PF) 0.5 % IJ SOLN
INTRAMUSCULAR | Status: DC | PRN
  Administered 2014-12-21: 20 mL

## 2014-12-21 MED ORDER — LIDOCAINE HCL (PF) 1 % IJ SOLN
INTRAMUSCULAR | Status: AC
  Filled 2014-12-21: qty 5

## 2014-12-21 MED ORDER — EPHEDRINE SULFATE 50 MG/ML IJ SOLN
INTRAMUSCULAR | Status: AC
  Filled 2014-12-21: qty 2

## 2014-12-21 SURGICAL SUPPLY — 40 items
BAG HAMPER (MISCELLANEOUS) ×2 IMPLANT
BANDAGE ELASTIC 2 VELCRO NS LF (GAUZE/BANDAGES/DRESSINGS) ×2 IMPLANT
BANDAGE ELASTIC 3 VELCRO ST LF (GAUZE/BANDAGES/DRESSINGS) IMPLANT
BANDAGE ELASTIC 6 VELCRO ST LF (GAUZE/BANDAGES/DRESSINGS) IMPLANT
BANDAGE ESMARK 4X12 BL STRL LF (DISPOSABLE) ×1 IMPLANT
BNDG CONFORM 2 STRL LF (GAUZE/BANDAGES/DRESSINGS) IMPLANT
BNDG ESMARK 4X12 BLUE STRL LF (DISPOSABLE) ×2
CLOTH BEACON ORANGE TIMEOUT ST (SAFETY) ×2 IMPLANT
COVER LIGHT HANDLE STERIS (MISCELLANEOUS) ×4 IMPLANT
CUFF TOURNIQUET SINGLE 18IN (TOURNIQUET CUFF) ×2 IMPLANT
DRSG ALLEVYN 2X2 (GAUZE/BANDAGES/DRESSINGS) IMPLANT
ELECT REM PT RETURN 9FT ADLT (ELECTROSURGICAL) ×2
ELECTRODE REM PT RTRN 9FT ADLT (ELECTROSURGICAL) ×1 IMPLANT
GAUZE KERLIX 2  STERILE LF (GAUZE/BANDAGES/DRESSINGS) ×2 IMPLANT
GAUZE SPONGE 4X4 12PLY STRL (GAUZE/BANDAGES/DRESSINGS) ×2 IMPLANT
GLOVE BIOGEL PI IND STRL 7.0 (GLOVE) ×1 IMPLANT
GLOVE BIOGEL PI INDICATOR 7.0 (GLOVE) ×1
GLOVE EXAM NITRILE MD LF STRL (GLOVE) ×2 IMPLANT
GLOVE SKINSENSE NS SZ8.0 LF (GLOVE) ×1
GLOVE SKINSENSE STRL SZ8.0 LF (GLOVE) ×1 IMPLANT
GLOVE SS BIOGEL STRL SZ 6.5 (GLOVE) ×1 IMPLANT
GLOVE SS N UNI LF 8.5 STRL (GLOVE) ×2 IMPLANT
GLOVE SUPERSENSE BIOGEL SZ 6.5 (GLOVE) ×1
GOWN STRL REUS W/TWL LRG LVL3 (GOWN DISPOSABLE) ×2 IMPLANT
GOWN STRL REUS W/TWL XL LVL3 (GOWN DISPOSABLE) ×2 IMPLANT
HAND ALUMI LG (SOFTGOODS) IMPLANT
KIT ROOM TURNOVER APOR (KITS) ×2 IMPLANT
MANIFOLD NEPTUNE II (INSTRUMENTS) ×2 IMPLANT
NEEDLE HYPO 21X1.5 SAFETY (NEEDLE) ×2 IMPLANT
NS IRRIG 1000ML POUR BTL (IV SOLUTION) ×2 IMPLANT
PACK BASIC LIMB (CUSTOM PROCEDURE TRAY) ×2 IMPLANT
PAD ABD 5X9 TENDERSORB (GAUZE/BANDAGES/DRESSINGS) IMPLANT
PAD ARMBOARD 7.5X6 YLW CONV (MISCELLANEOUS) ×2 IMPLANT
PADDING CAST COTTON 6X4 STRL (CAST SUPPLIES) IMPLANT
SET BASIN LINEN APH (SET/KITS/TRAYS/PACK) ×2 IMPLANT
SPONGE GAUZE 2X2 8PLY STRL LF (GAUZE/BANDAGES/DRESSINGS) ×8 IMPLANT
SPONGE GAUZE 4X4 12PLY (GAUZE/BANDAGES/DRESSINGS) ×2 IMPLANT
SUT ETHILON 3 0 FSL (SUTURE) ×2 IMPLANT
SYR BULB IRRIGATION 50ML (SYRINGE) ×2 IMPLANT
VESSEL LOOPS MAXI RED (MISCELLANEOUS) IMPLANT

## 2014-12-21 NOTE — Transfer of Care (Signed)
Immediate Anesthesia Transfer of Care Note  Patient: Cory Roberts  Procedure(s) Performed: Procedure(s): INCISION AND DRAINAGE RIGHT THUMB (Right)  Patient Location: PACU  Anesthesia Type:General  Level of Consciousness: awake, alert  and patient cooperative  Airway & Oxygen Therapy: Patient Spontanous Breathing and non-rebreather face mask  Post-op Assessment: Report given to RN, Post -op Vital signs reviewed and stable and Patient moving all extremities  Post vital signs: Reviewed and stable    Complications: No apparent anesthesia complications

## 2014-12-21 NOTE — Anesthesia Postprocedure Evaluation (Signed)
Anesthesia Post Note  Patient: Cory Roberts  Procedure(s) Performed: Procedure(s) (LRB): INCISION AND DRAINAGE RIGHT THUMB (Right)  Patient location during evaluation: PACU Anesthesia Type: General Level of consciousness: awake and oriented Pain management: pain level controlled Vital Signs Assessment: post-procedure vital signs reviewed and stable Respiratory status: spontaneous breathing Cardiovascular status: stable Postop Assessment: Adequate PO intake Anesthetic complications: no    Last Vitals:  Filed Vitals:   12/21/14 1350 12/21/14 1400  BP: 135/85 124/88  Pulse: 65 65  Temp: 36.4 C   Resp:  17    Last Pain:  Filed Vitals:   12/21/14 1403  PainSc: 2                  Deysha Cartier

## 2014-12-21 NOTE — Anesthesia Postprocedure Evaluation (Signed)
Anesthesia Post Note  Patient: Cory Roberts  Procedure(s) Performed: Procedure(s) (LRB): INCISION AND DRAINAGE RIGHT THUMB (Right)  Patient location during evaluation: PACU Anesthesia Type: General Level of consciousness: awake and alert Pain management: pain level controlled Vital Signs Assessment: post-procedure vital signs reviewed and stable Respiratory status: spontaneous breathing, nonlabored ventilation, respiratory function stable and patient connected to nasal cannula oxygen Cardiovascular status: blood pressure returned to baseline and stable Postop Assessment: No signs of nausea or vomiting Anesthetic complications: no    Last Vitals:  Filed Vitals:   12/21/14 1240 12/21/14 1350  BP: 103/81 135/85  Pulse:    Temp:  36.4 C  Resp: 33     Last Pain:  Filed Vitals:   12/21/14 1353  PainSc: 2                  Fread Kottke

## 2014-12-21 NOTE — Op Note (Signed)
12/21/2014  1:27 PM  PATIENT:  Cory Roberts  54 y.o. male  PRE-OPERATIVE DIAGNOSIS:  infection right thumb  POST-OPERATIVE DIAGNOSIS:  infection right thumb  PROCEDURE:  Procedure(s): INCISION AND DRAINAGE RIGHT THUMB (Right)   Surgical findings clean tissue excellent granulation tissue no purulence wound was approximately 6 cm long. Necrotic skin was excised sharply.  Details of procedure after site marking and site confirmation and chart update the patient was taken to surgery for general anesthesia which was successful without complication. The right thumb was prepped and draped including the hand up to the elbow. Timeout was completed.  The wound was inspected irrigated debridement of sharp skin approximately an area of of 4 x 6 cm. The skin had separated from the underlying dermal layer. The wound bed itself is clean. Inspection with digital and instrumented dissection revealed no purulence  Dermal tissue approximately 2 cm x 1 side of many was excised. Sharply.  I then placed 4 sutures: 3-0 nylon, total to assist with covering the tendon to keep it moist.  A 2 x 2 was placed in the wound covered by 4 x 4's 2 inch gauze and Ace wrap  No tourniquet was used  SURGEON:  Surgeon(s) and Role:    * Stanley E Harrison, MD - Primary  PHYSICIAN ASSISTANT:   ASSISTANTS: none   ANESTHESIA:   general  EBL:  Total I/O In: 500 [I.V.:500] Out: 15 [Blood:15]  BLOOD ADMINISTERED:none  DRAINS: none   LOCAL MEDICATIONS USED:  MARCAINE     SPECIMEN:  No Specimen  DISPOSITION OF SPECIMEN:  N/A  COUNTS:  YES  TOURNIQUET:  * No tourniquets in log *  DICTATION: .Dragon Dictation  PLAN OF CARE: Admit to inpatient   PATIENT DISPOSITION:  PACU - hemodynamically stable.   Delay start of Pharmacological VTE agent (>24hrs) due to surgical blood loss or risk of bleeding: not applicable  Plan postop is for the patient to stay overnight for pain control and then discharge in the  morning and then set up wound care on an outpatient basis.  

## 2014-12-21 NOTE — Consult Note (Signed)
Referring Provider: Dr. Ardyth HarpsHernandez  Primary Care Physician:  Cassell SmilesFUSCO,LAWRENCE J., MD Primary Gastroenterologist:  Dr. Jena Gaussourk   Date of Admission: 12/15/14 Date of Consultation: 12/21/14  Reason for Consultation:  Dysphagia   HPI:  Cory Roberts is a 54 y.o. year old male who presented to the ED last week with abscess of his right thumb and cellulitis. Consultation with Dr. Romeo AppleHarrison completed, with I&D completed on 11/18. Repeat I&D scheduled today as well with Dr. Romeo AppleHarrison. GI consulted due to dysphagia.   Upon discussion with patient, states year long problem with dysphagia. States he has felt like food is getting hung up in throat area since "the tube went down my throat" (Friday). Chronic symptoms of feeling like food getting hung in epigastric area, but now feels like it is hung up in his throat. Solid food dysphagia. Odynophagia noted. Notes history of chronic GERD. Takes Tums, Pepcid, Prilosec only when flaring up. No prior EGD. No prior colonoscopy. No weight loss or lack of appetite. Takes NSAIDs and BC powders routinely. Notes vague bilateral upper quadrant tenderness since admission to hospital. No melena. No hematochezia. Was able to eat green beans, mashed potatoes, and ham yesterday. Felt like it "hung up" but eventually passed. No N/V.   Past Medical History  Diagnosis Date  . Raynaud's syndrome   . Arthritis Feb. 2013    Gout- Right foot  . Gout   . GERD (gastroesophageal reflux disease)   . CVA (cerebral infarction)     Past Surgical History  Procedure Laterality Date  . Hernia repair  2007  . Incision and drainage of thumb  12/18/14    Prior to Admission medications   Medication Sig Start Date End Date Taking? Authorizing Provider  aspirin 325 MG tablet Take 1 tablet (325 mg total) by mouth daily. 10/31/11  Yes Renae FickleMackenzie Short, MD  cephALEXin (KEFLEX) 500 MG capsule Take 1 capsule (500 mg total) by mouth 4 (four) times daily. 12/13/14  Yes Eber HongBrian Miller, MD   ibuprofen (ADVIL,MOTRIN) 800 MG tablet Take 1 tablet (800 mg total) by mouth 3 (three) times daily. 12/13/14  Yes Eber HongBrian Miller, MD    Current Facility-Administered Medications  Medication Dose Route Frequency Provider Last Rate Last Dose  . 0.9 %  sodium chloride infusion   Intravenous Continuous Houston SirenPeter Le, MD 75 mL/hr at 12/17/14 1859 1,000 mL at 12/17/14 1859  . 0.9 %  sodium chloride infusion   Intravenous Continuous Vickki HearingStanley E Harrison, MD 75 mL/hr at 12/21/14 0154    . acetaminophen (TYLENOL) tablet 650 mg  650 mg Oral Q6H PRN Houston SirenPeter Le, MD   650 mg at 12/16/14 2118   Or  . acetaminophen (TYLENOL) suppository 650 mg  650 mg Rectal Q6H PRN Houston SirenPeter Le, MD      . alum & mag hydroxide-simeth (MAALOX/MYLANTA) 200-200-20 MG/5ML suspension 30 mL  30 mL Oral Q4H PRN Henderson CloudEstela Y Hernandez Acosta, MD   30 mL at 12/19/14 1734  . aspirin tablet 325 mg  325 mg Oral Daily Houston SirenPeter Le, MD   325 mg at 12/20/14 0850  . docusate sodium (COLACE) capsule 100 mg  100 mg Oral BID Vickki HearingStanley E Harrison, MD   100 mg at 12/20/14 2154  . HYDROcodone-acetaminophen (NORCO/VICODIN) 5-325 MG per tablet 1-2 tablet  1-2 tablet Oral Q4H PRN Vickki HearingStanley E Harrison, MD   2 tablet at 12/20/14 2154  . morphine 2 MG/ML injection 2 mg  2 mg Intravenous Q2H PRN Houston SirenPeter Le, MD  2 mg at 12/19/14 1140  . ondansetron (ZOFRAN) tablet 4 mg  4 mg Oral QID PRN Henderson Cloud, MD       Or  . ondansetron [facility]) injection 4 mg  4 mg Intravenous Q6H PRN Henderson Cloud, MD   4 mg at 12/18/14 1625  . potassium chloride SA (K-DUR,KLOR-CON) CR tablet 40 mEq  40 mEq Oral Daily Houston Siren, MD   40 mEq at 12/20/14 0850  . traZODone (DESYREL) tablet 25 mg  25 mg Oral QHS PRN Vickki Hearing, MD      . vancomycin (VANCOCIN) IVPB 1000 mg/200 mL premix  1,000 mg Intravenous Q12H Houston Siren, MD   1,000 mg at 12/21/14 0516    Allergies as of 12/15/2014 - Review Complete 12/15/2014  Allergen Reaction Noted  . Codeine Nausea And Vomiting  05/08/2011  . Hydrocodone Nausea Only 10/28/2011    Family History  Problem Relation Age of Onset  . Cancer Mother   . Hyperlipidemia Mother   . Hypertension Mother   . Cancer Father   . Heart disease Father 31    Heart Disease before age 56  . Heart attack Father   . Diabetes Sister   . Cancer Sister   . Hyperlipidemia Sister   . Hypertension Sister   . Heart attack Mother   . Rheumatologic disease Mother   . Colon cancer Neg Hx     Social History   Social History  . Marital Status: Married    Spouse Name: N/A  . Number of Children: N/A  . Years of Education: N/A   Occupational History  . Not on file.   Social History Main Topics  . Smoking status: Former Smoker    Types: Cigarettes    Quit date: 01/30/2005  . Smokeless tobacco: Not on file  . Alcohol Use: No  . Drug Use: No  . Sexual Activity: Not on file   Other Topics Concern  . Not on file   Social History Narrative    Review of Systems: Gen: Denies fever, chills, loss of appetite, change in weight or weight loss CV: occasional palpitations  Resp: Denies shortness of breath with rest, cough, wheezing GI: see HPI  GU : Denies urinary burning, urinary frequency, urinary incontinence.  MS: +joint pain  Derm: Denies rash, itching, dry skin Psych: Denies depression, anxiety,confusion, or memory loss Heme: Denies bruising, bleeding, and enlarged lymph nodes.  Physical Exam: Vital signs in last 24 hours: Temp:  [97.5 F (36.4 C)-98.1 F (36.7 C)] 97.5 F (36.4 C) (11/20 2023) Pulse Rate:  [71-73] 73 (11/20 2023) Resp:  [18] 18 (11/20 2023) BP: (100-113)/(61-71) 113/71 mmHg (11/20 2023) SpO2:  [100 %] 100 % (11/20 2023) Last BM Date: 12/20/14 General:   Alert,  Well-developed, well-nourished, pleasant and cooperative in NAD Head:  Normocephalic and atraumatic. Eyes:  Sclera clear, no icterus.   Conjunctiva pink. Ears:  Normal auditory acuity. Nose:  No deformity, discharge,  or lesions. Mouth:   No deformity or lesions, dentition normal. Lungs:  Clear throughout to auscultation.   No wheezes, crackles, or rhonchi. No acute distress. Heart:  Regular rate and rhythm; no murmurs, clicks, rubs,  or gallops. Abdomen:  Soft, nontender and nondistended. No masses, hepatosplenomegaly or hernias noted. Normal bowel sounds, without guarding, and without rebound.   Rectal:  Deferred until time of colonoscopy.   Msk:  Symmetrical without gross deformities. Normal posture. Right hand wrapped, mild edema.  Extremities:  Without edema.  Neurologic:  Alert and  oriented x4;  grossly normal neurologically. Skin:  Intact without significant lesions or rashes. Psych:  Alert and cooperative. Normal mood and affect.  Intake/Output from previous day: 11/20 0701 - 11/21 0700 In: 2703.8 [P.O.:480; I.V.:1823.8; IV Piggyback:400] Out: -  Intake/Output this shift:    Lab Results:  Recent Labs  12/21/14 0710  WBC 6.1  HGB 13.3  HCT 39.2  PLT 323   BMET  Recent Labs  12/21/14 0710  NA 136  K 3.6  CL 102  CO2 29  GLUCOSE 98  BUN 16  CREATININE 0.99  CALCIUM 8.9    Impression: 54 year old male who presented to the ED with cellulitis and now undergoing second I&D of right thumb today by Dr. Romeo Apple, with GI complaints of chronic dysphagia. Appears he has had year long symptoms of solid food dysphagia in the setting of chronic GERD, routine NSAIDs and aspirin powders. He now notes worsening symptoms, pointing to cervical esophagus, stating food is now "hung" up and more difficulty in tolerating solid foods since surgery on Friday. However, he was able to tolerate green beans, potatoes, and ham yesterday. Likely acute on chronic symptoms multifactorial, and it appears this worsened after intubation. Would benefit from EGD with dilation, which could be done tomorrow tentatively, as today he already has a procedure planned with Dr. Romeo Apple. Will discuss further with Dr. Jena Gauss. As of note, also  needs routine screening colonoscopy as outpatient in an elective setting.   Plan: Start Protonix once daily Avoid NSAIDs, aspirin powders EGD with dilation in near future; will likely not be able to perform today due to I&D already scheduled with Dr. Romeo Apple Elective colonoscopy as outpatient   Nira Retort, ANP-BC [facility]    LOS: 6 days    12/21/2014, 9:51 AM

## 2014-12-21 NOTE — Progress Notes (Addendum)
Postop day 3 status post incision and drainage of abscess and cellulitis of the right thumb  BP 113/71 mmHg  Pulse 73  Temp(Src) 97.5 F (36.4 C) (Oral)  Resp 18  Ht 5\' 8"  (1.727 m)  Wt 180 lb (81.647 kg)  BMI 27.38 kg/m2  SpO2 100%  BMP Latest Ref Rng 12/21/2014 12/18/2014 12/17/2014  Glucose 65 - 99 mg/dL 98 161(W105(H) 960(A160(H)  BUN 6 - 20 mg/dL 16 15 16   Creatinine 0.61 - 1.24 mg/dL 5.400.99 9.810.97 1.910.91  Sodium 135 - 145 mmol/L 136 138 137  Potassium 3.5 - 5.1 mmol/L 3.6 4.3 3.2(L)  Chloride 101 - 111 mmol/L 102 107 105  CO2 22 - 32 mmol/L 29 26 26   Calcium 8.9 - 10.3 mg/dL 8.9 9.1 4.7(W8.7(L)    CBC Latest Ref Rng 12/21/2014 12/17/2014 12/16/2014  WBC 4.0 - 10.5 K/uL 6.1 7.0 12.0(H)  Hemoglobin 13.0 - 17.0 g/dL 29.513.3 62.113.4 12.7(L)  Hematocrit 39.0 - 52.0 % 39.2 39.4 37.2(L)  Platelets 150 - 400 K/uL 323 239 218    Current IV medication vancomycin  Patient will be back to the OR for second look incision and drainage irrigation and debridement and culture  Results for orders placed or performed during the hospital encounter of 12/15/14  Culture, routine-abscess     Status: None   Collection Time: 12/15/14  8:56 PM  Result Value Ref Range Status   Specimen Description ABSCESS RIGHT THUMB  Final   Special Requests NONE  Final   Gram Stain   Final    RARE WBC PRESENT, PREDOMINANTLY MONONUCLEAR NO SQUAMOUS EPITHELIAL CELLS SEEN NO ORGANISMS SEEN Performed at [facility]    Culture   Final    ABUNDANT METHICILLIN RESISTANT STAPHYLOCOCCUS AUREUS Note: RIFAMPIN AND GENTAMICIN SHOULD NOT BE USED AS SINGLE DRUGS FOR TREATMENT OF STAPH INFECTIONS. This organism DOES NOT demonstrate inducible Clindamycin resistance in vitro. CRITICAL RESULT CALLED TO, READ BACK BY AND VERIFIED WITH: TAMEEKA WATKINS @  1049 on [id number] BY [facility] Performed at [facility]    Report Status 12/18/2014 FINAL  Final   Organism ID, Bacteria METHICILLIN RESISTANT STAPHYLOCOCCUS AUREUS  Final       Susceptibility   Methicillin resistant staphylococcus aureus - MIC*    CLINDAMYCIN <=0.25 SENSITIVE Sensitive     ERYTHROMYCIN >=8 RESISTANT Resistant     GENTAMICIN <=0.5 SENSITIVE Sensitive     LEVOFLOXACIN 4 INTERMEDIATE Intermediate     OXACILLIN >=4 RESISTANT Resistant     RIFAMPIN <=0.5 SENSITIVE Sensitive     TRIMETH/SULFA <=10 SENSITIVE Sensitive     VANCOMYCIN <=0.5 SENSITIVE Sensitive     TETRACYCLINE <=1 SENSITIVE Sensitive     * ABUNDANT METHICILLIN RESISTANT STAPHYLOCOCCUS AUREUS  Culture, blood (routine x 2)     Status: None (Preliminary result)   Collection Time: 12/16/14 12:30 AM  Result Value Ref Range Status   Specimen Description BLOOD LEFT HAND  Final   Special Requests BOTTLES DRAWN AEROBIC AND ANAEROBIC 8CC EACH  Final   Culture NO GROWTH 4 DAYS  Final   Report Status PENDING  Incomplete  Culture, blood (routine x 2)     Status: None (Preliminary result)   Collection Time: 12/16/14 12:45 AM  Result Value Ref Range Status   Specimen Description BLOOD LEFT HAND  Final   Special Requests BOTTLES DRAWN AEROBIC AND ANAEROBIC 6CC EACH  Final   Culture NO GROWTH 4 DAYS  Final   Report Status PENDING  Incomplete  Surgical pcr screen  Status: Abnormal   Collection Time: 12/17/14  4:00 PM  Result Value Ref Range Status   MRSA, PCR NEGATIVE NEGATIVE Final   Staphylococcus aureus POSITIVE (A) NEGATIVE Final    Comment: RESULT CALLED TO, READ BACK BY AND VERIFIED WITH: PATTEN,M AT 2032 ON 12/17/2014 BY ISLEY,B        The Xpert SA Assay (FDA approved for NASAL specimens in patients over 59 years of age), is one component of a comprehensive surveillance program.  Test performance has been validated by [facility] for patients greater than or equal to 41 year old. It is not intended to diagnose infection nor to guide or monitor treatment.   Wound culture     Status: None (Preliminary result)   Collection Time: 12/18/14 10:55 AM  Result Value Ref Range Status    Specimen Description THUMB RIGHT  Final   Special Requests NONE  Final   Gram Stain   Final    FEW WBC PRESENT,BOTH PMN AND MONONUCLEAR NO SQUAMOUS EPITHELIAL CELLS SEEN NO ORGANISMS SEEN Performed at [facility]    Culture   Final    MODERATE STAPHYLOCOCCUS AUREUS Note: RIFAMPIN AND GENTAMICIN SHOULD NOT BE USED AS SINGLE DRUGS FOR TREATMENT OF STAPH INFECTIONS. Performed at [facility]    Report Status PENDING  Incomplete  Anaerobic culture     Status: None (Preliminary result)   Collection Time: 12/18/14 10:55 AM  Result Value Ref Range Status   Specimen Description THUMB RIGHT  Final   Special Requests NONE  Final   Gram Stain   Final    FEW WBC PRESENT,BOTH PMN AND MONONUCLEAR NO SQUAMOUS EPITHELIAL CELLS SEEN NO ORGANISMS SEEN Performed at [facility]    Culture   Final    NO ANAEROBES ISOLATED; CULTURE IN PROGRESS FOR 5 DAYS Performed at [facility]    Report Status PENDING  Incomplete

## 2014-12-21 NOTE — Progress Notes (Signed)
[facility] PROGRESS NOTE  RAIF CHACHERE ZHY:[id number] DOB: [birth year] DOA: 12/15/2014 PCP: Cassell Smiles., MD  Assessment/Plan: Right Thumb Abscess/Cellulitis -S/p I and D in OR 11/18 and again 11/21. -Appreciate ortho input and recommendations. -Will continue to follow ortho guidelines for continued wound care. -Continue vancomycin. -Wound cx collected in ED with MRSA, multiple PO options. -Continue PRN pain management.  Dysphagia -He c/o food getting hung in throat. -Especially with large chunks. -Advised to sit vertically to eat and follow each bite with a sip of fluids. -Await GI recommendations re: need for EGD with potential dilatation.   Hypokalemia -Repleted. -Mg ok at 1.9.  Leukocytosis -2/2 above. -Resolved with antibiotics.  Code Status: Full Code Family Communication: Patient only Disposition Plan: As per ortho   Consultants:  Ortho   Antibiotics:  Vancomycin   Subjective: Mild pain and edema of the fingers of the right hand. C/o food getting hung mid-esophagus when swallowing.  Objective: Filed Vitals:   12/21/14 1240 12/21/14 1350 12/21/14 1400 12/21/14 1415  BP: 103/81 135/85 124/88 128/82  Pulse:  65 65 57  Temp:  97.5 F (36.4 C)    TempSrc:      Resp: 33  17 15  Height:      Weight:      SpO2: 99% 100% 98% 100%    Intake/Output Summary (Last 24 hours) at 12/21/14 1425 Last data filed at 12/21/14 1350  Gross per 24 hour  Intake 2723.75 ml  Output     15 ml  Net 2708.75 ml   Filed Weights   12/15/14 2247 12/18/14 0952 12/21/14 1135  Weight: 81.7 kg (180 lb 1.9 oz) 81.647 kg (180 lb) 81.647 kg (180 lb)    Exam:   General:  AA ox3  Cardiovascular: RRR  Respiratory: CTA B  Abdomen: S/NT/ND/+BS  Extremities: no C/C/E, right thumb wrapped in clean dressing. Streaky erythema resolved.  Neurologic:  Grossly intact and non-focal  Data Reviewed: Basic Metabolic Panel:  Recent Labs Lab 12/15/14 1943  12/16/14 0650 12/17/14 0919 12/17/14 1548 12/18/14 0657 12/21/14 0710  NA 137 136 137  --  138 136  K 3.4* 3.8 3.2*  --  4.3 3.6  CL 101 104 105  --  107 102  CO2 --  26 29  GLUCOSE 97 112* 160*  --  105* 98  BUN 21* 16 16  --  15 16  CREATININE 1.07 0.83 0.91  --  0.97 0.99  CALCIUM 9.2 8.7* 8.7*  --  9.1 8.9  MG  --   --   --  1.9  --   --    Liver Function Tests: No results for input(s): AST, ALT, ALKPHOS, BILITOT, PROT, ALBUMIN in the last 168 hours. No results for input(s): LIPASE, AMYLASE in the last 168 hours. No results for input(s): AMMONIA in the last 168 hours. CBC:  Recent Labs Lab 12/15/14 1943 12/16/14 0650 12/17/14 0919 12/21/14 0710  WBC 12.2* 12.0* 7.0 6.1  NEUTROABS 9.4*  --  4.9  --   HGB 13.9 12.7* 13.4 13.3  HCT 39.9 37.2* 39.4 39.2  MCV 97.1 97.1 98.0 96.8  PLT 244 218 239 323   Cardiac Enzymes: No results for input(s): CKTOTAL, CKMB, CKMBINDEX, TROPONINI in the last 168 hours. BNP (last 3 results) No results for input(s): BNP in the last 8760 hours.  ProBNP (last 3 results) No results for input(s): PROBNP in the last 8760 hours.  CBG: No results for  input(s): GLUCAP in the last 168 hours.  Recent Results (from the past 240 hour(s))  Culture, routine-abscess     Status: None   Collection Time: 12/15/14  8:56 PM  Result Value Ref Range Status   Specimen Description ABSCESS RIGHT THUMB  Final   Special Requests NONE  Final   Gram Stain   Final    RARE WBC PRESENT, PREDOMINANTLY MONONUCLEAR NO SQUAMOUS EPITHELIAL CELLS SEEN NO ORGANISMS SEEN Performed at [facility]    Culture   Final    ABUNDANT METHICILLIN RESISTANT STAPHYLOCOCCUS AUREUS Note: RIFAMPIN AND GENTAMICIN SHOULD NOT BE USED AS SINGLE DRUGS FOR TREATMENT OF STAPH INFECTIONS. This organism DOES NOT demonstrate inducible Clindamycin resistance in vitro. CRITICAL RESULT CALLED TO, READ BACK BY AND VERIFIED WITH: TAMEEKA WATKINS @  1049 on [id number] BY  [facility] Performed at [facility]    Report Status 12/18/2014 FINAL  Final   Organism ID, Bacteria METHICILLIN RESISTANT STAPHYLOCOCCUS AUREUS  Final      Susceptibility   Methicillin resistant staphylococcus aureus - MIC*    CLINDAMYCIN <=0.25 SENSITIVE Sensitive     ERYTHROMYCIN >=8 RESISTANT Resistant     GENTAMICIN <=0.5 SENSITIVE Sensitive     LEVOFLOXACIN 4 INTERMEDIATE Intermediate     OXACILLIN >=4 RESISTANT Resistant     RIFAMPIN <=0.5 SENSITIVE Sensitive     TRIMETH/SULFA <=10 SENSITIVE Sensitive     VANCOMYCIN <=0.5 SENSITIVE Sensitive     TETRACYCLINE <=1 SENSITIVE Sensitive     * ABUNDANT METHICILLIN RESISTANT STAPHYLOCOCCUS AUREUS  Culture, blood (routine x 2)     Status: None   Collection Time: 12/16/14 12:30 AM  Result Value Ref Range Status   Specimen Description BLOOD LEFT HAND  Final   Special Requests BOTTLES DRAWN AEROBIC AND ANAEROBIC 8CC EACH  Final   Culture NO GROWTH 5 DAYS  Final   Report Status 12/21/2014 FINAL  Final  Culture, blood (routine x 2)     Status: None   Collection Time: 12/16/14 12:45 AM  Result Value Ref Range Status   Specimen Description BLOOD LEFT HAND  Final   Special Requests BOTTLES DRAWN AEROBIC AND ANAEROBIC 6CC EACH  Final   Culture NO GROWTH 5 DAYS  Final   Report Status 12/21/2014 FINAL  Final  Surgical pcr screen     Status: Abnormal   Collection Time: 12/17/14  4:00 PM  Result Value Ref Range Status   MRSA, PCR NEGATIVE NEGATIVE Final   Staphylococcus aureus POSITIVE (A) NEGATIVE Final    Comment: RESULT CALLED TO, READ BACK BY AND VERIFIED WITH: PATTEN,M AT 2032 ON 12/17/2014 BY ISLEY,B        The Xpert SA Assay (FDA approved for NASAL specimens in patients over 56 years of age), is one component of a comprehensive surveillance program.  Test performance has been validated by [facility] for patients greater than or equal to 56 year old. It is not intended to diagnose infection nor to guide or monitor  treatment.   Wound culture     Status: None   Collection Time: 12/18/14 10:55 AM  Result Value Ref Range Status   Specimen Description THUMB RIGHT  Final   Special Requests NONE  Final   Gram Stain   Final    FEW WBC PRESENT,BOTH PMN AND MONONUCLEAR NO SQUAMOUS EPITHELIAL CELLS SEEN NO ORGANISMS SEEN Performed at [facility]    Culture   Final    MODERATE METHICILLIN RESISTANT STAPHYLOCOCCUS AUREUS Note: RIFAMPIN AND GENTAMICIN  SHOULD NOT BE USED AS SINGLE DRUGS FOR TREATMENT OF STAPH INFECTIONS. This organism DOES NOT demonstrate inducible Clindamycin resistance in vitro. CRITICAL RESULT CALLED TO, READ BACK BY AND VERIFIED WITH: TAMIKA WATKINS  12/21/14 1040 BY SMITHERSJ Performed at [facility]    Report Status 12/21/2014 FINAL  Final   Organism ID, Bacteria METHICILLIN RESISTANT STAPHYLOCOCCUS AUREUS  Final      Susceptibility   Methicillin resistant staphylococcus aureus - MIC*    CLINDAMYCIN <=0.25 SENSITIVE Sensitive     ERYTHROMYCIN >=8 RESISTANT Resistant     GENTAMICIN <=0.5 SENSITIVE Sensitive     LEVOFLOXACIN 4 INTERMEDIATE Intermediate     OXACILLIN >=4 RESISTANT Resistant     RIFAMPIN <=0.5 SENSITIVE Sensitive     TRIMETH/SULFA <=10 SENSITIVE Sensitive     VANCOMYCIN 1 SENSITIVE Sensitive     TETRACYCLINE <=1 SENSITIVE Sensitive     * MODERATE METHICILLIN RESISTANT STAPHYLOCOCCUS AUREUS  Anaerobic culture     Status: None (Preliminary result)   Collection Time: 12/18/14 10:55 AM  Result Value Ref Range Status   Specimen Description THUMB RIGHT  Final   Special Requests NONE  Final   Gram Stain   Final    FEW WBC PRESENT,BOTH PMN AND MONONUCLEAR NO SQUAMOUS EPITHELIAL CELLS SEEN NO ORGANISMS SEEN Performed at [facility]    Culture   Final    NO ANAEROBES ISOLATED; CULTURE IN PROGRESS FOR 5 DAYS Performed at [facility]    Report Status PENDING  Incomplete     Studies: No results found.  Scheduled Meds: . [MAR  Hold] aspirin  325 mg Oral Daily  . chlorhexidine  60 mL Topical Once  . [MAR Hold] docusate sodium  100 mg Oral BID  . [MAR Hold] potassium chloride  40 mEq Oral Daily  . scopolamine  1 patch Transdermal Q72H  . [MAR Hold] vancomycin  1,000 mg Intravenous Q12H   Continuous Infusions: . sodium chloride 1,000 mL (12/17/14 1859)  . sodium chloride 75 mL/hr at 12/21/14 0154  . lactated ringers 75 mL/hr at 12/21/14 1222    Principal Problem:   Abscess of thumb Active Problems:   Obesity   Dyslipidemia   At risk for diabetes mellitus   Cellulitis and abscess   Abscess of right thumb   Dysphagia    Time spent: 25 minutes. Greater than 50% of this time was spent in direct contact with the patient coordinating care.    Chaya Jan  [facility] Pager [phone]  If 7PM-7AM, please contact night-coverage at [email or web address], password [facility] 12/21/2014, 2:25 PM  LOS: 6 days

## 2014-12-21 NOTE — Anesthesia Preprocedure Evaluation (Addendum)
Anesthesia Evaluation  Patient identified by MRN, date of birth, ID band Patient awake    Reviewed: Allergy & Precautions, NPO status , Patient's Chart, lab work & pertinent test results  Airway Mallampati: I  TM Distance: >3 FB     Dental  (+) Poor Dentition, Dental Advisory Given   Pulmonary neg pulmonary ROS, former smoker,    breath sounds clear to auscultation       Cardiovascular + Peripheral Vascular Disease  negative cardio ROS   Rhythm:Regular Rate:Normal     Neuro/Psych  Headaches, CVA, No Residual Symptoms    GI/Hepatic negative GI ROS, GERD  ,  Endo/Other    Renal/GU      Musculoskeletal   Abdominal   Peds  Hematology   Anesthesia Other Findings   Reproductive/Obstetrics                            Anesthesia Physical Anesthesia Plan  ASA: III  Anesthesia Plan: General   Post-op Pain Management:    Induction: Intravenous and Rapid sequence  Airway Management Planned: Oral ETT  Additional Equipment:   Intra-op Plan:   Post-operative Plan: Extubation in OR  Informed Consent: I have reviewed the patients History and Physical, chart, labs and discussed the procedure including the risks, benefits and alternatives for the proposed anesthesia with the patient or authorized representative who has indicated his/her understanding and acceptance.     Plan Discussed with:   Anesthesia Plan Comments: (Due to difficulty placing LMA last time, will proceed to GOT.)       Anesthesia Quick Evaluation                                  Anesthesia Evaluation  Patient identified by MRN, date of birth, ID band Patient awake    Reviewed: Allergy & Precautions, NPO status , Patient's Chart, lab work & pertinent test results  Airway Mallampati: I  TM Distance: >3 FB     Dental  (+) Poor Dentition, Dental Advisory Given   Pulmonary neg pulmonary ROS, former smoker,     breath sounds clear to auscultation       Cardiovascular negative cardio ROS   Rhythm:Regular Rate:Normal     Neuro/Psych  Headaches, CVA, No Residual Symptoms    GI/Hepatic negative GI ROS,   Endo/Other    Renal/GU      Musculoskeletal   Abdominal   Peds  Hematology   Anesthesia Other Findings   Reproductive/Obstetrics                             Anesthesia Physical Anesthesia Plan  ASA: III  Anesthesia Plan: General   Post-op Pain Management:    Induction: Intravenous  Airway Management Planned: LMA  Additional Equipment:   Intra-op Plan:   Post-operative Plan: Extubation in OR  Informed Consent: I have reviewed the patients History and Physical, chart, labs and discussed the procedure including the risks, benefits and alternatives for the proposed anesthesia with the patient or authorized representative who has indicated his/her understanding and acceptance.     Plan Discussed with:   Anesthesia Plan Comments:         Anesthesia Quick Evaluation

## 2014-12-21 NOTE — Anesthesia Procedure Notes (Signed)
Procedure Name: Intubation Date/Time: 12/21/2014 12:58 PM Performed by: Franco NonesYATES, Lajuan Kovaleski S Pre-anesthesia Checklist: Patient identified, Patient being monitored, Timeout performed, Emergency Drugs available and Suction available Patient Re-evaluated:Patient Re-evaluated prior to inductionOxygen Delivery Method: Circle System Utilized Preoxygenation: Pre-oxygenation with 100% oxygen Intubation Type: IV induction, Cricoid Pressure applied and Rapid sequence Ventilation: Mask ventilation without difficulty Laryngoscope Size: Miller and 2 Grade View: Grade I Tube type: Oral Tube size: 7.0 mm Number of attempts: 1 Airway Equipment and Method: Stylet and Oral airway Placement Confirmation: ETT inserted through vocal cords under direct vision,  positive ETCO2 and breath sounds checked- equal and bilateral Secured at: 21 cm Tube secured with: Tape Dental Injury: Teeth and Oropharynx as per pre-operative assessment

## 2014-12-21 NOTE — Brief Op Note (Signed)
12/21/2014  1:27 PM  PATIENT:  Cory Roberts  54 y.o. male  PRE-OPERATIVE DIAGNOSIS:  infection right thumb  POST-OPERATIVE DIAGNOSIS:  infection right thumb  PROCEDURE:  Procedure(s): INCISION AND DRAINAGE RIGHT THUMB (Right)   Surgical findings clean tissue excellent granulation tissue no purulence wound was approximately 6 cm long. Necrotic skin was excised sharply.  Details of procedure after site marking and site confirmation and chart update the patient was taken to surgery for general anesthesia which was successful without complication. The right thumb was prepped and draped including the hand up to the elbow. Timeout was completed.  The wound was inspected irrigated debridement of sharp skin approximately an area of of 4 x 6 cm. The skin had separated from the underlying dermal layer. The wound bed itself is clean. Inspection with digital and instrumented dissection revealed no purulence  Dermal tissue approximately 2 cm x 1 side of many was excised. Sharply.  I then placed 4 sutures: 3-0 nylon, total to assist with covering the tendon to keep it moist.  A 2 x 2 was placed in the wound covered by 4 x 4's 2 inch gauze and Ace wrap  No tourniquet was used  SURGEON:  Surgeon(s) and Role:    * Vickki HearingStanley E Harrison, MD - Primary  PHYSICIAN ASSISTANT:   ASSISTANTS: none   ANESTHESIA:   general  EBL:  Total I/O In: 500 [I.V.:500] Out: 15 [Blood:15]  BLOOD ADMINISTERED:none  DRAINS: none   LOCAL MEDICATIONS USED:  MARCAINE     SPECIMEN:  No Specimen  DISPOSITION OF SPECIMEN:  N/A  COUNTS:  YES  TOURNIQUET:  * No tourniquets in log *  DICTATION: .Dragon Dictation  PLAN OF CARE: Admit to inpatient   PATIENT DISPOSITION:  PACU - hemodynamically stable.   Delay start of Pharmacological VTE agent (>24hrs) due to surgical blood loss or risk of bleeding: not applicable  Plan postop is for the patient to stay overnight for pain control and then discharge in the  morning and then set up wound care on an outpatient basis.

## 2014-12-22 ENCOUNTER — Encounter (HOSPITAL_COMMUNITY)
Admission: EM | Disposition: A | Discharge status: Home Health | Payer: Self-pay | Source: Home / Self Care | Attending: Internal Medicine

## 2014-12-22 ENCOUNTER — Encounter (HOSPITAL_COMMUNITY): Payer: Self-pay | Admitting: Orthopedic Surgery

## 2014-12-22 DIAGNOSIS — K210 Gastro-esophageal reflux disease with esophagitis: Secondary | ICD-10-CM

## 2014-12-22 DIAGNOSIS — K269 Duodenal ulcer, unspecified as acute or chronic, without hemorrhage or perforation: Secondary | ICD-10-CM

## 2014-12-22 DIAGNOSIS — K259 Gastric ulcer, unspecified as acute or chronic, without hemorrhage or perforation: Secondary | ICD-10-CM

## 2014-12-22 DIAGNOSIS — Q394 Esophageal web: Secondary | ICD-10-CM

## 2014-12-22 HISTORY — PX: ESOPHAGOGASTRODUODENOSCOPY: SHX5428

## 2014-12-22 SURGERY — EGD (ESOPHAGOGASTRODUODENOSCOPY)
Anesthesia: Moderate Sedation

## 2014-12-22 MED ORDER — STERILE WATER FOR IRRIGATION IR SOLN
Status: DC | PRN
  Administered 2014-12-22: 15:00:00

## 2014-12-22 MED ORDER — MIDAZOLAM HCL 5 MG/5ML IJ SOLN
INTRAMUSCULAR | Status: AC
  Filled 2014-12-22: qty 5

## 2014-12-22 MED ORDER — MINERAL OIL PO OIL
TOPICAL_OIL | ORAL | Status: AC
  Filled 2014-12-22: qty 30

## 2014-12-22 MED ORDER — PANTOPRAZOLE SODIUM 40 MG PO TBEC
40.0000 mg | DELAYED_RELEASE_TABLET | Freq: Two times a day (BID) | ORAL | Status: DC
  Administered 2014-12-22 – 2014-12-23 (×2): 40 mg via ORAL
  Filled 2014-12-22 (×2): qty 1

## 2014-12-22 MED ORDER — MUPIROCIN 2 % EX OINT
1.0000 "application " | TOPICAL_OINTMENT | Freq: Two times a day (BID) | CUTANEOUS | Status: DC
  Administered 2014-12-22 – 2014-12-23 (×3): 1 via NASAL
  Filled 2014-12-22: qty 22

## 2014-12-22 MED ORDER — LIDOCAINE VISCOUS 2 % MT SOLN
OROMUCOSAL | Status: AC
  Filled 2014-12-22: qty 15

## 2014-12-22 MED ORDER — CHLORHEXIDINE GLUCONATE CLOTH 2 % EX PADS
6.0000 | MEDICATED_PAD | Freq: Every day | CUTANEOUS | Status: DC
  Administered 2014-12-22 – 2014-12-23 (×2): 6 via TOPICAL

## 2014-12-22 MED ORDER — MEPERIDINE HCL 100 MG/ML IJ SOLN
INTRAMUSCULAR | Status: AC
  Filled 2014-12-22: qty 1

## 2014-12-22 MED ORDER — MIDAZOLAM HCL 5 MG/5ML IJ SOLN
INTRAMUSCULAR | Status: DC | PRN
  Administered 2014-12-22: 1 mg via INTRAVENOUS
  Administered 2014-12-22 (×2): 2 mg via INTRAVENOUS

## 2014-12-22 MED ORDER — MIDAZOLAM HCL 5 MG/5ML IJ SOLN
INTRAMUSCULAR | Status: DC
Start: 2014-12-22 — End: 2014-12-22
  Filled 2014-12-22: qty 5

## 2014-12-22 MED ORDER — PROMETHAZINE HCL 25 MG/ML IJ SOLN: 12.5000 mg | Freq: Once | INTRAMUSCULAR | Status: DC

## 2014-12-22 MED ORDER — MEPERIDINE HCL 100 MG/ML IJ SOLN
INTRAMUSCULAR | Status: DC | PRN
  Administered 2014-12-22 (×3): 25 mg via INTRAVENOUS

## 2014-12-22 MED ORDER — LIDOCAINE VISCOUS 2 % MT SOLN
OROMUCOSAL | Status: DC | PRN
  Administered 2014-12-22: 1 via OROMUCOSAL

## 2014-12-22 NOTE — Progress Notes (Signed)
Please schedule an appointment for Cory Roberts to see me next Thursday  When he is discharged please arrange for wound care at the [facility] outpatient rehabilitation facility Monday Wednesday Fridays starting this Wednesday wet-to-dry dressing changes every other day  If this is not available then please set up home wound care therapy Monday Wednesday Friday starting this Wednesday   If you have any questions please call [phone]

## 2014-12-22 NOTE — H&P (Signed)
Primary Care Physician:  Cassell SmilesFUSCO,LAWRENCE J., MD Primary Gastroenterologist:  Dr. Darrick PennaFields  Pre-Procedure History & Physical: HPI:  Cory Roberts is a 54 y.o. male [facility]  Past Medical History  Diagnosis Date  . Raynaud's syndrome   . Arthritis Feb. 2013    Gout- Right foot  . Gout   . GERD (gastroesophageal reflux disease)   . CVA (cerebral infarction)   . Headache     Migraine Headaches    Past Surgical History  Procedure Laterality Date  . Incision and drainage of thumb  12/18/14  . Incision and drainage Right 12/18/2014    Procedure: INCISION AND DRAINAGE THUMB;  Surgeon: Vickki HearingStanley E Harrison, MD;  Location: [facility];  Service: Orthopedics;  Laterality: Right;  . Incision and drainage Right 12/21/2014    Procedure: INCISION AND DRAINAGE RIGHT THUMB;  Surgeon: Vickki HearingStanley E Harrison, MD;  Location: [facility];  Service: Orthopedics;  Laterality: Right;  . Hernia repair Left 2007    Prior to Admission medications   Medication Sig Start Date End Date Taking? Authorizing Provider  aspirin 325 MG tablet Take 1 tablet (325 mg total) by mouth daily. 10/31/11  Yes Renae FickleMackenzie Short, MD  cephALEXin (KEFLEX) 500 MG capsule Take 1 capsule (500 mg total) by mouth 4 (four) times daily. 12/13/14  Yes Eber HongBrian Miller, MD  ibuprofen (ADVIL,MOTRIN) 800 MG tablet Take 1 tablet (800 mg total) by mouth 3 (three) times daily. 12/13/14  Yes Eber HongBrian Miller, MD    Allergies as of 12/15/2014 - Review Complete 12/15/2014  Allergen Reaction Noted  . Codeine Nausea And Vomiting 05/08/2011  . Hydrocodone Nausea Only 10/28/2011    Family History  Problem Relation Age of Onset  . Cancer Mother   . Hyperlipidemia Mother   . Hypertension Mother   . Cancer Father   . Heart disease Father [id number]    Heart Disease before age 56  . Heart attack Father   . Diabetes Sister   . Cancer Sister   . Hyperlipidemia Sister   . Hypertension Sister   . Heart attack Mother   . Rheumatologic disease Mother   .  Colon cancer Neg Hx     Social History   Social History  . Marital Status: Married    Spouse Name: N/A  . Number of Children: N/A  . Years of Education: N/A   Occupational History  . Not on file.   Social History Main Topics  . Smoking status: Former Smoker -- 3.00 packs/day for 30 years    Types: Cigarettes    Quit date: 01/30/2005  . Smokeless tobacco: Not on file  . Alcohol Use: No  . Drug Use: No  . Sexual Activity: Not on file   Other Topics Concern  . Not on file   Social History Narrative    Review of Systems: See HPI, otherwise negative ROS   Physical Exam: BP 118/79 mmHg  Pulse 62  Temp(Src) 97.7 F (36.5 C) (Oral)  Resp 11  Ht 5\' 8"  (1.727 m)  Wt 180 lb (81.647 kg)  BMI 27.38 kg/m2  SpO2 99% General:   Alert,  pleasant and cooperative in NAD Head:  Normocephalic and atraumatic. Neck:  Supple; Lungs:  Clear throughout to auscultation.    Heart:  Regular rate and rhythm. Abdomen:  Soft, nontender and nondistended. Normal bowel sounds, without guarding, and without rebound.   Neurologic:  Alert and  oriented x4;  grossly normal neurologically.  Impression/Plan:     DYSPHAGIA/DYSPEPSIA  PLAN:  EGD/DIL TODAY

## 2014-12-22 NOTE — Progress Notes (Signed)
ANTIBIOTIC CONSULT NOTE  Pharmacy Consult for Vancomycin Indication: cellulitis / wound infection  Allergies  Allergen Reactions  . Codeine Nausea And Vomiting  . Hydrocodone Nausea Only    Patient Measurements: Height: 5\' 8"  (172.7 cm) Weight: 180 lb (81.647 kg) IBW/kg (Calculated) : 68.4  Vital Signs: Temp: 97.5 F (36.4 C) (11/22 0530) Temp Source: Oral (11/22 0530) BP: 125/91 mmHg (11/22 0530) Pulse Rate: 85 (11/22 0530) Intake/Output from previous day: 11/21 0701 - 11/22 0700 In: 2080.5 [P.O.:240; I.V.:1640.5; IV Piggyback:200] Out: 15 [Blood:15] Intake/Output from this shift:    Labs:  Recent Labs  12/21/14 0710  WBC 6.1  HGB 13.3  PLT 323  CREATININE 0.99   Estimated Creatinine Clearance: 80.6 mL/min (by C-G formula based on Cr of 0.99). No results for input(s): VANCOTROUGH, VANCOPEAK, VANCORANDOM, GENTTROUGH, GENTPEAK, GENTRANDOM, TOBRATROUGH, TOBRAPEAK, TOBRARND, AMIKACINPEAK, AMIKACINTROU, AMIKACIN in the last 72 hours.   Microbiology: Recent Results (from the past 720 hour(s))  Culture, routine-abscess     Status: None   Collection Time: 12/15/14  8:56 PM  Result Value Ref Range Status   Specimen Description ABSCESS RIGHT THUMB  Final   Special Requests NONE  Final   Gram Stain   Final    RARE WBC PRESENT, PREDOMINANTLY MONONUCLEAR NO SQUAMOUS EPITHELIAL CELLS SEEN NO ORGANISMS SEEN Performed at [facility]    Culture   Final    ABUNDANT METHICILLIN RESISTANT STAPHYLOCOCCUS AUREUS Note: RIFAMPIN AND GENTAMICIN SHOULD NOT BE USED AS SINGLE DRUGS FOR TREATMENT OF STAPH INFECTIONS. This organism DOES NOT demonstrate inducible Clindamycin resistance in vitro. CRITICAL RESULT CALLED TO, READ BACK BY AND VERIFIED WITH: TAMEEKA WATKINS @  1049 on [id number] BY [facility] Performed at [facility]    Report Status 12/18/2014 FINAL  Final   Organism ID, Bacteria METHICILLIN RESISTANT STAPHYLOCOCCUS AUREUS  Final      Susceptibility   Methicillin resistant staphylococcus aureus - MIC*    CLINDAMYCIN <=0.25 SENSITIVE Sensitive     ERYTHROMYCIN >=8 RESISTANT Resistant     GENTAMICIN <=0.5 SENSITIVE Sensitive     LEVOFLOXACIN 4 INTERMEDIATE Intermediate     OXACILLIN >=4 RESISTANT Resistant     RIFAMPIN <=0.5 SENSITIVE Sensitive     TRIMETH/SULFA <=10 SENSITIVE Sensitive     VANCOMYCIN <=0.5 SENSITIVE Sensitive     TETRACYCLINE <=1 SENSITIVE Sensitive     * ABUNDANT METHICILLIN RESISTANT STAPHYLOCOCCUS AUREUS  Culture, blood (routine x 2)     Status: None   Collection Time: 12/16/14 12:30 AM  Result Value Ref Range Status   Specimen Description BLOOD LEFT HAND  Final   Special Requests BOTTLES DRAWN AEROBIC AND ANAEROBIC 8CC EACH  Final   Culture NO GROWTH 5 DAYS  Final   Report Status 12/21/2014 FINAL  Final  Culture, blood (routine x 2)     Status: None   Collection Time: 12/16/14 12:45 AM  Result Value Ref Range Status   Specimen Description BLOOD LEFT HAND  Final   Special Requests BOTTLES DRAWN AEROBIC AND ANAEROBIC [facility]6CC EACH  Final   Culture NO GROWTH 5 DAYS  Final   Report Status 12/21/2014 FINAL  Final  Surgical pcr screen     Status: Abnormal   Collection Time: 12/17/14  4:00 PM  Result Value Ref Range Status   MRSA, PCR NEGATIVE NEGATIVE Final   Staphylococcus aureus POSITIVE (A) NEGATIVE Final    Comment: RESULT CALLED TO, READ BACK BY AND VERIFIED WITH: PATTEN,M AT 2032 ON 12/17/2014 BY ISLEY,B  The Xpert SA Assay (FDA approved for NASAL specimens in patients over 54 years of age), is one component of a comprehensive surveillance program.  Test performance has been validated by [facility] for patients greater than or equal to 42 year old. It is not intended to diagnose infection nor to guide or monitor treatment.   Wound culture     Status: None   Collection Time: 12/18/14 10:55 AM  Result Value Ref Range Status   Specimen Description THUMB RIGHT  Final   Special Requests NONE  Final    Gram Stain   Final    FEW WBC PRESENT,BOTH PMN AND MONONUCLEAR NO SQUAMOUS EPITHELIAL CELLS SEEN NO ORGANISMS SEEN Performed at [facility]    Culture   Final    MODERATE METHICILLIN RESISTANT STAPHYLOCOCCUS AUREUS Note: RIFAMPIN AND GENTAMICIN SHOULD NOT BE USED AS SINGLE DRUGS FOR TREATMENT OF STAPH INFECTIONS. This organism DOES NOT demonstrate inducible Clindamycin resistance in vitro. CRITICAL RESULT CALLED TO, READ BACK BY AND VERIFIED WITH: TAMIKA WATKINS  12/21/14 1040 BY SMITHERSJ Performed at [facility]    Report Status 12/21/2014 FINAL  Final   Organism ID, Bacteria METHICILLIN RESISTANT STAPHYLOCOCCUS AUREUS  Final      Susceptibility   Methicillin resistant staphylococcus aureus - MIC*    CLINDAMYCIN <=0.25 SENSITIVE Sensitive     ERYTHROMYCIN >=8 RESISTANT Resistant     GENTAMICIN <=0.5 SENSITIVE Sensitive     LEVOFLOXACIN 4 INTERMEDIATE Intermediate     OXACILLIN >=4 RESISTANT Resistant     RIFAMPIN <=0.5 SENSITIVE Sensitive     TRIMETH/SULFA <=10 SENSITIVE Sensitive     VANCOMYCIN 1 SENSITIVE Sensitive     TETRACYCLINE <=1 SENSITIVE Sensitive     * MODERATE METHICILLIN RESISTANT STAPHYLOCOCCUS AUREUS  Anaerobic culture     Status: None (Preliminary result)   Collection Time: 12/18/14 10:55 AM  Result Value Ref Range Status   Specimen Description THUMB RIGHT  Final   Special Requests NONE  Final   Gram Stain   Final    FEW WBC PRESENT,BOTH PMN AND MONONUCLEAR NO SQUAMOUS EPITHELIAL CELLS SEEN NO ORGANISMS SEEN Performed at [facility]    Culture   Final    NO ANAEROBES ISOLATED; CULTURE IN PROGRESS FOR 5 DAYS Performed at [facility]    Report Status PENDING  Incomplete    Anti-infectives    Start     Dose/Rate Route Frequency Ordered Stop   12/16/14 1000  vancomycin (VANCOCIN) IVPB 1000 mg/200 mL premix     1,000 mg 200 mL/hr over 60 Minutes Intravenous Every 12 hours 12/15/14 2301     12/15/14 2115  vancomycin  (VANCOCIN) IVPB 1000 mg/200 mL premix     1,000 mg 200 mL/hr over 60 Minutes Intravenous  Once 12/15/14 2111 12/15/14 2226     Assessment: 54yo male admitted with cellulitis of right thumb s/p I&D 11/18 and 11/21 in OR.  Cultures MRSA.  Vanc trough on 11/18 was 11 mg/L.  Renal function remains stable.  Goal of Therapy:  Vancomycin trough level 10-15 mcg/ml  Plan:  Continue Vancomycin 1gm IV every 12 hours. BMET q MWF.  Talbert Cage Poteet 12/22/2014,11:06 AM

## 2014-12-22 NOTE — Op Note (Addendum)
[facility] [address] [place], [id number]   ENDOSCOPY PROCEDURE REPORT  PATIENT: [name], Cory L  MR#: [id number] BIRTHDATE: [birth year] , 54  yrs. old GENDER: male  ENDOSCOPIST: West BaliSandi L Shamra Bradeen, MD REFFERED XB:JYNWGBY:[name], M.D. PROCEDURE DATE:  12/22/2014 PROCEDURE:   EGD with biopsy and with dilatation over guidewire INDICATIONS:1.  dyspepsia.   2.  dysphagia. USES OMEPRAZOLE PRN FOR REFLUX. USES ASA QD AND IBUPROFEN FREQUENTLY.  MEDICATIONS: Demerol 75 mg IV and Versed 5 mg IV TOPICAL ANESTHETIC:  DESCRIPTION OF PROCEDURE:   After the risks benefits and alternatives of the procedure were thoroughly explained, informed consent was obtained.  The EG-[id number] ([id number]  endoscope was introduced through the mouth and advanced to the second portion of the duodenum. The instrument was slowly withdrawn as the mucosa was carefully examined.  Prior to withdrawal of the scope, the guidwire was placed.  The esophagus was dilated successfully.  The patient was recovered in endoscopy and discharged home in satisfactory condition. Estimated blood loss is zero unless otherwise noted in this procedure report.   ESOPHAGUS: MID-ESOPHAGEAL WEB.   MULTIPLE LINEAR EROSIONS AND OCCASIONAL ULCER IN DISTAL ESOPHAGUS. STOMACH: A hiatal hernia was found.   Multiple small punctate ulcers were found in the gastric antrum.  Biopsies were taken around the ulcers.   DUODENUM: Multiple small non-bleeding clean-based and punctate ulcers were found in the duodenal bulb.   Mild duodenal inflammation was found in the duodenal bulb.   Dilation was then performed at the mid esophagus Dilator: Savary over guidewire Size(s): 14-16 MM Resistance: minimal Heme: yes TRACE  COMPLICATIONS: There were no immediate complications.  ENDOSCOPIC IMPRESSION: 1.   DYSPHAGIA DUE TO MID-ESOPHAGEAL WEB AND UNCONTROLLED REFLUX/ESOPHAGITIS 2.   SMALL Hiatal hernia 4.   Multiple small GASTRIC AND DUODENAL ulcers  AND MILD DUODENITIS  RECOMMENDATIONS: AWAIT BIOPSY BID PPI FOR 3 MOS THEN DAILY FOREVER AVOID NSAIDS FOR 7 DAYS AVOID REFLUX TRIGGERS MAY CONTINUE ASA DUE TO HISTORY OF CAD. REPEAT EGD IN 3 MOS TO ASSESS THAT ULCERS HAVE HEALED & CONSIDER ESOPHAGEAL DILATION OPV IN 6 MOS   eSigned:  West BaliSandi L Hady Niemczyk, MD 12/22/2014 3:52 PM  CPT CODES: ICD CODES:  The ICD and CPT codes recommended by this software are interpretations from the data that the clinical staff has captured with the software.  The verification of the translation of this report to the ICD and CPT codes and modifiers is the sole responsibility of the health care institution and practicing physician where this report was generated.  [employer] will not be held responsible for the validity of the ICD and CPT codes included on this report.  AMA assumes no liability for data contained or not contained herein. CPT is a Publishing rights managerregistered trademark of the [employer].

## 2014-12-22 NOTE — Care Management Note (Signed)
Case Management Note  Patient Details  Name: Lovena LeRicky L Coggin MRN: [id number] Date of Birth: 1960-07-06  Subjective/Objective:                    Action/Plan:   Expected Discharge Date:                  Expected Discharge Plan:  Home/Self Care  In-House Referral:  NA  Discharge planning Services  CM Consult  Post Acute Care Choice:  NA Choice offered to:  NA  DME Arranged:    DME Agency:     HH Arranged:    HH Agency:     Status of Service:  Completed, signed off  Medicare Important Message Given:    Date Medicare IM Given:    Medicare IM give by:    Date Additional Medicare IM Given:    Additional Medicare Important Message give by:     If discussed at Long Length of Stay Meetings, dates discussed:12/22/14    Additional Comments:  Cheryl FlashBlackwell, Ronne Savoia Crowder, RN 12/22/2014, 3:50 PM

## 2014-12-22 NOTE — Care Management Note (Signed)
Case Management Note  Patient Details  Name: Cory Roberts MRN: [id number] Date of Birth: 03/08/1960  Subjective/Objective:                    Action/Plan:   Expected Discharge Date:                  Expected Discharge Plan:  Home/Self Care  In-House Referral:  NA  Discharge planning Services  CM Consult  Post Acute Care Choice:  NA Choice offered to:  NA  DME Arranged:    DME Agency:     HH Arranged:    HH Agency:     Status of Service:  Completed, signed off  Medicare Important Message Given:    Date Medicare IM Given:    Medicare IM give by:    Date Additional Medicare IM Given:    Additional Medicare Important Message give by:     If discussed at Long Length of Stay Meetings, dates discussed:    Additional Comments: Arrangements made with [facility] outpt PT dept for wound care to start on 12/23/14 at 9:30. Pt out of room for procedure but bedside RN to relay information to pt.  [name], [name], RN 12/22/2014, 2:36 PM

## 2014-12-22 NOTE — Progress Notes (Signed)
[facility] PROGRESS NOTE  Cory Roberts GNF:[id number] DOB: 10-Apr-1960 DOA: 12/15/2014 PCP: Cassell Smiles., MD  Assessment/Plan: Right Thumb Abscess/Cellulitis -S/p I and D in OR 11/18 and again 11/21. -Appreciate ortho input and recommendations. -Will continue to follow ortho guidelines for continued wound care. -Will transition to PO doxy for 2 weeks, based on wound cultures with MRSA.  Dysphagia -He c/o food getting hung in throat. -EGD today with possible dilatation.  Hypokalemia -Repleted. -Mg ok at 1.9.  Leukocytosis -2/2 above. -Resolved with antibiotics.  Code Status: Full Code Family Communication: Patient only Disposition Plan: likely home in am   Consultants:  Ortho   Antibiotics:  Doxycycline  Subjective: Mild pain and edema of the fingers of the right hand. C/o food getting hung mid-esophagus when swallowing.  Objective: Filed Vitals:   12/22/14 1520 12/22/14 1525 12/22/14 1530 12/22/14 1535  BP: 112/81 124/91 148/97   Pulse: 74 65 58 66  Temp:      TempSrc:      Resp: Height:      Weight:      SpO2: 95% 97% 98% 95%    Intake/Output Summary (Last 24 hours) at 12/22/14 1538 Last data filed at 12/22/14 1200  Gross per 24 hour  Intake   1518 ml  Output      0 ml  Net   1518 ml   Filed Weights   12/15/14 2247 12/18/14 0952 12/21/14 1135  Weight: 81.7 kg (180 lb 1.9 oz) 81.647 kg (180 lb) 81.647 kg (180 lb)    Exam:   General:  AA ox3  Cardiovascular: RRR  Respiratory: CTA B  Abdomen: S/NT/ND/+BS  Extremities: no C/C/E, right thumb wrapped in clean dressing. Streaky erythema resolved.  Neurologic:  Grossly intact and non-focal  Data Reviewed: Basic Metabolic Panel:  Recent Labs Lab 12/15/14 1943 12/16/14 0650 12/17/14 0919 12/17/14 1548 12/18/14 0657 12/21/14 0710  NA 137 136 137  --  138 136  K 3.4* 3.8 3.2*  --  4.3 3.6  CL 101 104 105  --  107 102  CO2 --  26 29  GLUCOSE 97  112* 160*  --  105* 98  BUN 21* 16 16  --  15 16  CREATININE 1.07 0.83 0.91  --  0.97 0.99  CALCIUM 9.2 8.7* 8.7*  --  9.1 8.9  MG  --   --   --  1.9  --   --    Liver Function Tests: No results for input(s): AST, ALT, ALKPHOS, BILITOT, PROT, ALBUMIN in the last 168 hours. No results for input(s): LIPASE, AMYLASE in the last 168 hours. No results for input(s): AMMONIA in the last 168 hours. CBC:  Recent Labs Lab 12/15/14 1943 12/16/14 0650 12/17/14 0919 12/21/14 0710  WBC 12.2* 12.0* 7.0 6.1  NEUTROABS 9.4*  --  4.9  --   HGB 13.9 12.7* 13.4 13.3  HCT 39.9 37.2* 39.4 39.2  MCV 97.1 97.1 98.0 96.8  PLT 244 218 239 323   Cardiac Enzymes: No results for input(s): CKTOTAL, CKMB, CKMBINDEX, TROPONINI in the last 168 hours. BNP (last 3 results) No results for input(s): BNP in the last 8760 hours.  ProBNP (last 3 results) No results for input(s): PROBNP in the last 8760 hours.  CBG: No results for input(s): GLUCAP in the last 168 hours.  Recent Results (from the past 240 hour(s))  Culture, routine-abscess     Status: None  Collection Time: 12/15/14  8:56 PM  Result Value Ref Range Status   Specimen Description ABSCESS RIGHT THUMB  Final   Special Requests NONE  Final   Gram Stain   Final    RARE WBC PRESENT, PREDOMINANTLY MONONUCLEAR NO SQUAMOUS EPITHELIAL CELLS SEEN NO ORGANISMS SEEN Performed at [facility]    Culture   Final    ABUNDANT METHICILLIN RESISTANT STAPHYLOCOCCUS AUREUS Note: RIFAMPIN AND GENTAMICIN SHOULD NOT BE USED AS SINGLE DRUGS FOR TREATMENT OF STAPH INFECTIONS. This organism DOES NOT demonstrate inducible Clindamycin resistance in vitro. CRITICAL RESULT CALLED TO, READ BACK BY AND VERIFIED WITH: TAMEEKA WATKINS @  1049 on [id number] BY [facility] Performed at [facility]    Report Status 12/18/2014 FINAL  Final   Organism ID, Bacteria METHICILLIN RESISTANT STAPHYLOCOCCUS AUREUS  Final      Susceptibility   Methicillin resistant  staphylococcus aureus - MIC*    CLINDAMYCIN <=0.25 SENSITIVE Sensitive     ERYTHROMYCIN >=8 RESISTANT Resistant     GENTAMICIN <=0.5 SENSITIVE Sensitive     LEVOFLOXACIN 4 INTERMEDIATE Intermediate     OXACILLIN >=4 RESISTANT Resistant     RIFAMPIN <=0.5 SENSITIVE Sensitive     TRIMETH/SULFA <=10 SENSITIVE Sensitive     VANCOMYCIN <=0.5 SENSITIVE Sensitive     TETRACYCLINE <=1 SENSITIVE Sensitive     * ABUNDANT METHICILLIN RESISTANT STAPHYLOCOCCUS AUREUS  Culture, blood (routine x 2)     Status: None   Collection Time: 12/16/14 12:30 AM  Result Value Ref Range Status   Specimen Description BLOOD LEFT HAND  Final   Special Requests BOTTLES DRAWN AEROBIC AND ANAEROBIC 8CC EACH  Final   Culture NO GROWTH 5 DAYS  Final   Report Status 12/21/2014 FINAL  Final  Culture, blood (routine x 2)     Status: None   Collection Time: 12/16/14 12:45 AM  Result Value Ref Range Status   Specimen Description BLOOD LEFT HAND  Final   Special Requests BOTTLES DRAWN AEROBIC AND ANAEROBIC 6CC EACH  Final   Culture NO GROWTH 5 DAYS  Final   Report Status 12/21/2014 FINAL  Final  Surgical pcr screen     Status: Abnormal   Collection Time: 12/17/14  4:00 PM  Result Value Ref Range Status   MRSA, PCR NEGATIVE NEGATIVE Final   Staphylococcus aureus POSITIVE (A) NEGATIVE Final    Comment: RESULT CALLED TO, READ BACK BY AND VERIFIED WITH: PATTEN,M AT 2032 ON 12/17/2014 BY ISLEY,B        The Xpert SA Assay (FDA approved for NASAL specimens in patients over 60 years of age), is one component of a comprehensive surveillance program.  Test performance has been validated by [facility] for patients greater than or equal to 66 year old. It is not intended to diagnose infection nor to guide or monitor treatment.   Wound culture     Status: None   Collection Time: 12/18/14 10:55 AM  Result Value Ref Range Status   Specimen Description THUMB RIGHT  Final   Special Requests NONE  Final   Gram Stain   Final     FEW WBC PRESENT,BOTH PMN AND MONONUCLEAR NO SQUAMOUS EPITHELIAL CELLS SEEN NO ORGANISMS SEEN Performed at [facility]    Culture   Final    MODERATE METHICILLIN RESISTANT STAPHYLOCOCCUS AUREUS Note: RIFAMPIN AND GENTAMICIN SHOULD NOT BE USED AS SINGLE DRUGS FOR TREATMENT OF STAPH INFECTIONS. This organism DOES NOT demonstrate inducible Clindamycin resistance in vitro. CRITICAL RESULT CALLED TO,  READ BACK BY AND VERIFIED WITH: Zannie KehrAMIKA WATKINS  12/21/14 1040 BY SMITHERSJ [facility] at [facility]    Report Status 12/21/2014 FINAL  Final   Organism ID, Bacteria METHICILLIN RESISTANT STAPHYLOCOCCUS AUREUS  Final      Susceptibility   Methicillin resistant staphylococcus aureus - MIC*    CLINDAMYCIN <=0.25 SENSITIVE Sensitive     ERYTHROMYCIN >=8 RESISTANT Resistant     GENTAMICIN <=0.5 SENSITIVE Sensitive     LEVOFLOXACIN 4 INTERMEDIATE Intermediate     OXACILLIN >=4 RESISTANT Resistant     RIFAMPIN <=0.5 SENSITIVE Sensitive     TRIMETH/SULFA <=10 SENSITIVE Sensitive     VANCOMYCIN 1 SENSITIVE Sensitive     TETRACYCLINE <=1 SENSITIVE Sensitive     * MODERATE METHICILLIN RESISTANT STAPHYLOCOCCUS AUREUS  Anaerobic culture     Status: None (Preliminary result)   Collection Time: 12/18/14 10:55 AM  Result Value Ref Range Status   Specimen Description THUMB RIGHT  Final   Special Requests NONE  Final   Gram Stain   Final    FEW WBC PRESENT,BOTH PMN AND MONONUCLEAR NO SQUAMOUS EPITHELIAL CELLS SEEN NO ORGANISMS SEEN [facility]formed at [facility]    Culture   Final    NO ANAEROBES ISOLATED; CULTURE IN PROGRESS FOR 5 DAYS Performed at [facility]    Report Status PENDING  Incomplete     Studies: No results found.  Scheduled Meds: . [MAR Hold] aspirin  325 mg Oral Daily  . [MAR Hold] Chlorhexidine Gluconate Cloth  6 each Topical Q0600  . [MAR Hold] docusate sodium  100 mg Oral BID  . lidocaine      . meperidine      . midazolam      . mineral  oil      . [MAR Hold] mupirocin ointment  1 application Nasal BID  . [MAR Hold] pantoprazole  40 mg Oral BID AC  . [MAR Hold] potassium chloride  40 mEq Oral Daily  . promethazine  12.5 mg Intravenous Once  . [MAR Hold] sodium chloride  3 mL Intravenous Q12H  . [MAR Hold] vancomycin  1,000 mg Intravenous Q12H   Continuous Infusions: . sodium chloride 20 mL/hr at 12/22/14 [id number]    Principal Problem:   Abscess of thumb Active Problems:   Obesity   Dyslipidemia   At risk for diabetes mellitus   Cellulitis and abscess   Abscess of right thumb   Dysphagia    Time spent: 25 minutes. Greater than 50% of this time was spent in direct contact with the patient coordinating care.    Chaya JanHERNANDEZ ACOSTA,ESTELA  [facility] Pager [phone]  If 7PM-7AM, please contact night-coverage at [email or web address], password [email or web address] 12/22/2014, 3:38 PM  LOS: 7 days

## 2014-12-23 ENCOUNTER — Encounter: Payer: Self-pay | Admitting: Orthopedic Surgery

## 2014-12-23 ENCOUNTER — Ambulatory Visit (HOSPITAL_COMMUNITY): Payer: BLUE CROSS/BLUE SHIELD | Admitting: Physical Therapy

## 2014-12-23 ENCOUNTER — Telehealth (HOSPITAL_COMMUNITY): Payer: Self-pay | Admitting: Physical Therapy

## 2014-12-23 ENCOUNTER — Telehealth: Payer: Self-pay | Admitting: Gastroenterology

## 2014-12-23 ENCOUNTER — Encounter: Payer: Self-pay | Admitting: Gastroenterology

## 2014-12-23 DIAGNOSIS — K279 Peptic ulcer, site unspecified, unspecified as acute or chronic, without hemorrhage or perforation: Secondary | ICD-10-CM

## 2014-12-23 DIAGNOSIS — L02511 Cutaneous abscess of right hand: Principal | ICD-10-CM

## 2014-12-23 LAB — ANAEROBIC CULTURE

## 2014-12-23 LAB — BASIC METABOLIC PANEL
ANION GAP: 8 (ref 5–15)
BUN: 14 mg/dL (ref 6–20)
CO2: 26 mmol/L (ref 22–32)
Calcium: 8.6 mg/dL — ABNORMAL LOW (ref 8.9–10.3)
Chloride: 100 mmol/L — ABNORMAL LOW (ref 101–111)
Creatinine, Ser: 1.1 mg/dL (ref 0.61–1.24)
GFR calc Af Amer: >60 mL/min (ref >60)
GFR calc non Af Amer: >60 mL/min (ref >60)
GLUCOSE: 130 mg/dL — AB (ref 65–99)
POTASSIUM: 3.4 mmol/L — AB (ref 3.5–5.1)
Sodium: 134 mmol/L — ABNORMAL LOW (ref 135–145)

## 2014-12-23 MED ORDER — PANTOPRAZOLE SODIUM 40 MG PO TBEC: 40.0000 mg | DELAYED_RELEASE_TABLET | Freq: Two times a day (BID) | ORAL | Status: DC

## 2014-12-23 MED ORDER — DOXYCYCLINE HYCLATE 100 MG PO TABS: 100.0000 mg | ORAL_TABLET | Freq: Two times a day (BID) | ORAL | Status: DC

## 2014-12-23 NOTE — Telephone Encounter (Signed)
APPT MADE AND LETTER SENT  °

## 2014-12-23 NOTE — Telephone Encounter (Signed)
Please schedule hospital follow-up in 10 weeks with Gerrit HallsAnna Sams, NP or myself to schedule EGD and colonoscopy.

## 2014-12-23 NOTE — Progress Notes (Signed)
Wet to dry dressing change to right thumb done prior to discharge. Bloody drainage noted,tolerated dressing change well.

## 2014-12-23 NOTE — Discharge Summary (Signed)
Physician Discharge Summary  Cory Roberts ZOX:[id number] DOB: 1961-01-12 DOA: 12/15/2014  PCP: Cassell SmilesFUSCO,LAWRENCE J., MD  Admit date: 12/15/2014 Discharge date: 12/23/2014  Recommendations for Outpatient Follow-up:  1. F/u thumb abscess, finish abx, continue wound care: Continue wet-to-dry dressing changes every other day 2. HH RN   Follow-up Information    Follow up with [facility] O[facility] On 12/23/2014.   Why:  at 9:30 for wound care   Contact information:   [address] [place] [address] [phone]      Follow up with Fuller CanadaStanley Harrison, MD On 12/31/2014.   Specialties:  Orthopedic Surgery, Radiology   Why:  Hospital Follow up   Contact information:   [address] [place] [facility] [id number] [phone]       Follow up with [facility].   Contact information:   [address] [place] [address] [phone]       Follow up with Cassell SmilesFUSCO,LAWRENCE J., MD.   Specialty:  Internal Medicine   Why:  As needed   Contact information:   [address] [place] [address] [phone]       Follow up with Jonette EvaSandi Fields, MD.   Specialty:  Gastroenterology   Why:  office will contact you for appointment   Contact information:   [address] [place] [address] [phone]      Discharge Diagnoses:  1. Right thumb abscess, cellulitis secondary to MRSA 2. Dysphagia secondaryto esophageal web, uncontrolled reflux/esophagitis  Discharge Condition: improved Disposition: home  Diet recommendation: heart healthy  Filed Weights   12/15/14 2247 12/18/14 0952 12/21/14 1135  Weight: 81.7 kg (180 lb 1.9 oz) 81.647 kg (180 lb) 81.647 kg (180 lb)    History of present illness:  54yom presented with right thumb pain with red streaking, failing outpatient treatment. Found to have abscess in ED. I&D in ED and admitted for further evaluation.  Hospital Course:  Started on IV vancomycin. Seen  by orthopedics, underwent I&D in OR x2. Improved rapidly and cleared for d/c by orthopedics with rec for outpatient f/u. Also c/o dysphagia. Seen by GI, underwent EGD with significant post-EGD improvement. Plan home today on oral abx with outpatient f/u with orthopedics in 1 week.  Consultants:  Orthopedics  GI  Procedures:  I&D in ED  11/18 INCISION AND DRAINAGE THUMB (Right)   11/21 INCISION AND DRAINAGE RIGHT THUMB (Right)   EGD ENDOSCOPIC IMPRESSION: 1. DYSPHAGIA DUE TO MID-ESOPHAGEAL WEB AND UNCONTROLLED REFLUX/ESOPHAGITIS 2. SMALL Hiatal hernia 4. Multiple small GASTRIC AND DUODENAL ulcers AND MILD DUODENITIS  Antibiotics:  Doxycycline 11/23 >> 12/4  Vancomycin 11/15 >> 11/23  Discharge Instructions  Discharge Instructions    Diet - low sodium heart healthy    Complete by:  As directed      Discharge instructions    Complete by:  As directed   Call your physician or seek immediate medical attention for increased pain, drainage, fever, difficulty using hand or worsening of condition. Wound care instructions as directed by Dr. Romeo AppleHarrison. Activity per Dr. Romeo AppleHarrison     Discharge wound care:    Complete by:  As directed   Wet-to-dry dressing changes every other day          Current Discharge Medication List    START taking these medications   Details  doxycycline (VIBRA-TABS) 100 MG tablet Take 1 tablet (100 mg total) by mouth every 12 (twelve) hours. Qty: 20 tablet, Refills: 0  pantoprazole (PROTONIX) 40 MG tablet Take 1 tablet (40 mg total) by mouth 2 (two) times daily before a meal. Qty: 60 tablet, Refills: 0      CONTINUE these medications which have NOT CHANGED   Details  aspirin 325 MG tablet Take 1 tablet (325 mg total) by mouth daily. Qty: 90 tablet, Refills: 3      STOP taking these medications     cephALEXin (KEFLEX) 500 MG capsule      ibuprofen (ADVIL,MOTRIN) 800 MG tablet      sulfamethoxazole-trimethoprim (BACTRIM DS,SEPTRA DS)  800-160 MG tablet        Allergies  Allergen Reactions  . Codeine Nausea And Vomiting  . Hydrocodone Nausea Only    The results of significant diagnostics from this hospitalization (including imaging, microbiology, ancillary and laboratory) are listed below for reference.    Significant Diagnostic Studies: Dg Chest Port 1 View  12/17/2014  CLINICAL DATA:  Preop cardiovascular exam. EXAM: PORTABLE CHEST 1 VIEW COMPARISON:  10/29/2011 FINDINGS: The heart size and mediastinal contours are within normal limits. Both lungs are clear. The visualized skeletal structures are unremarkable. IMPRESSION: No active disease. Electronically Signed   By: Norva Pavlov M.D.   On: 12/17/2014 08:56   Dg Hand Complete Right  12/17/2014  CLINICAL DATA:  Cellulitis right thumb EXAM: RIGHT HAND - COMPLETE 3+ VIEW COMPARISON:  None. FINDINGS: Soft tissue swelling of the thumb. There is gas in the soft tissues compatible with infection and wound. Negative for osteomyelitis Negative for fracture or arthropathy IMPRESSION: Soft tissue swelling of the thumb. Negative for acute bony abnormality. Electronically Signed   By: Marlan Palau M.D.   On: 12/17/2014 07:57    Microbiology: Recent Results (from the past 240 hour(s))  Culture, routine-abscess     Status: None   Collection Time: 12/15/14  8:56 PM  Result Value Ref Range Status   Specimen Description ABSCESS RIGHT THUMB  Final   Special Requests NONE  Final   Gram Stain   Final    RARE WBC PRESENT, PREDOMINANTLY MONONUCLEAR NO SQUAMOUS EPITHELIAL CELLS SEEN NO ORGANISMS SEEN Performed at [facility]    Culture   Final    ABUNDANT METHICILLIN RESISTANT STAPHYLOCOCCUS AUREUS Note: RIFAMPIN AND GENTAMICIN SHOULD NOT BE USED AS SINGLE DRUGS FOR TREATMENT OF STAPH INFECTIONS. This organism DOES NOT demonstrate inducible Clindamycin resistance in vitro. CRITICAL RESULT CALLED TO, READ BACK BY AND VERIFIED WITH: TAMEEKA WATKINS @  1049 on [id number]  BY [facility] Performed at [facility]    Report Status 12/18/2014 FINAL  Final   Organism ID, Bacteria METHICILLIN RESISTANT STAPHYLOCOCCUS AUREUS  Final      Susceptibility   Methicillin resistant staphylococcus aureus - MIC*    CLINDAMYCIN <=0.25 SENSITIVE Sensitive     ERYTHROMYCIN >=8 RESISTANT Resistant     GENTAMICIN <=0.5 SENSITIVE Sensitive     LEVOFLOXACIN 4 INTERMEDIATE Intermediate     OXACILLIN >=4 RESISTANT Resistant     RIFAMPIN <=0.5 SENSITIVE Sensitive     TRIMETH/SULFA <=10 SENSITIVE Sensitive     VANCOMYCIN <=0.5 SENSITIVE Sensitive     TETRACYCLINE <=1 SENSITIVE Sensitive     * ABUNDANT METHICILLIN RESISTANT STAPHYLOCOCCUS AUREUS  Culture, blood (routine x 2)     Status: None   Collection Time: 12/16/14 12:30 AM  Result Value Ref Range Status   Specimen Description BLOOD LEFT HAND  Final   Special Requests BOTTLES DRAWN AEROBIC AND ANAEROBIC 8CC EACH  Final   Culture NO GROWTH  5 DAYS  Final   Report Status 12/21/2014 FINAL  Final  Culture, blood (routine x 2)     Status: None   Collection Time: 12/16/14 12:45 AM  Result Value Ref Range Status   Specimen Description BLOOD LEFT HAND  Final   Special Requests BOTTLES DRAWN AEROBIC AND ANAEROBIC 6CC EACH  Final   Culture NO GROWTH 5 DAYS  Final   Report Status 12/21/2014 FINAL  Final  Surgical pcr screen     Status: Abnormal   Collection Time: 12/17/14  4:00 PM  Result Value Ref Range Status   MRSA, PCR NEGATIVE NEGATIVE Final   Staphylococcus aureus POSITIVE (A) NEGATIVE Final    Comment: RESULT CALLED TO, READ BACK BY AND VERIFIED WITH: PATTEN,M AT 2032 ON 12/17/2014 BY ISLEY,B        The Xpert SA Assay (FDA approved for NASAL specimens in patients over 54 years of age), is one component of a comprehensive surveillance program.  Test performance has been validated by [facility] for patients greater than or equal to 51 year old. It is not intended to diagnose infection nor to guide or monitor  treatment.   Wound culture     Status: None   Collection Time: 12/18/14 10:55 AM  Result Value Ref Range Status   Specimen Description THUMB RIGHT  Final   Special Requests NONE  Final   Gram Stain   Final    FEW WBC PRESENT,BOTH PMN AND MONONUCLEAR NO SQUAMOUS EPITHELIAL CELLS SEEN NO ORGANISMS SEEN Performed at [facility]    Culture   Final    MODERATE METHICILLIN RESISTANT STAPHYLOCOCCUS AUREUS Note: RIFAMPIN AND GENTAMICIN SHOULD NOT BE USED AS SINGLE DRUGS FOR TREATMENT OF STAPH INFECTIONS. This organism DOES NOT demonstrate inducible Clindamycin resistance in vitro. CRITICAL RESULT CALLED TO, READ BACK BY AND VERIFIED WITH: TAMIKA WATKINS  12/21/14 1040 BY SMITHERSJ Performed at [facility]    Report Status 12/21/2014 FINAL  Final   Organism ID, Bacteria METHICILLIN RESISTANT STAPHYLOCOCCUS AUREUS  Final      Susceptibility   Methicillin resistant staphylococcus aureus - MIC*    CLINDAMYCIN <=0.25 SENSITIVE Sensitive     ERYTHROMYCIN >=8 RESISTANT Resistant     GENTAMICIN <=0.5 SENSITIVE Sensitive     LEVOFLOXACIN 4 INTERMEDIATE Intermediate     OXACILLIN >=4 RESISTANT Resistant     RIFAMPIN <=0.5 SENSITIVE Sensitive     TRIMETH/SULFA <=10 SENSITIVE Sensitive     VANCOMYCIN 1 SENSITIVE Sensitive     TETRACYCLINE <=1 SENSITIVE Sensitive     * MODERATE METHICILLIN RESISTANT STAPHYLOCOCCUS AUREUS  Anaerobic culture     Status: None   Collection Time: 12/18/14 10:55 AM  Result Value Ref Range Status   Specimen Description WOUND RIGHT THUMB  Final   Special Requests NONE  Final   Gram Stain   Final    FEW WBC PRESENT,BOTH PMN AND MONONUCLEAR NO SQUAMOUS EPITHELIAL CELLS SEEN NO ORGANISMS SEEN Performed at [facility]    Culture   Final    NO ANAEROBES ISOLATED Performed at [facility]    Report Status 12/23/2014 FINAL  Final     Labs: Basic Metabolic Panel:  Recent Labs Lab 12/17/14 0919 12/17/14 1548 12/18/14 0657  12/21/14 0710 12/23/14 0647  NA 137  --  138 136 134*  K 3.2*  --  4.3 3.6 3.4*  CL 105  --  107 102 100*  CO2 26  --  GLUCOSE 160*  --  105* 98 130*  BUN 16  --  CREATININE 0.91  --  0.97 0.99 1.10  CALCIUM 8.7*  --  9.1 8.9 8.6*  MG  --  1.9  --   --   --    CBC:   Recent Labs Lab 12/17/14 0919 12/21/14 0710  WBC 7.0 6.1  NEUTROABS 4.9  --   HGB 13.4 13.3  HCT 39.4 39.2  MCV 98.0 96.8  PLT 239 323    Principal Problem:   Abscess of thumb Active Problems:   Obesity   Dyslipidemia   At risk for diabetes mellitus   Cellulitis and abscess   Abscess of right thumb   Dysphagia   PUD (peptic ulcer disease)   Time coordinating discharge: 35 minuites  Signed:  Brendia Sacks, MD [facility] 12/23/2014, 3:01 PM

## 2014-12-23 NOTE — Consult Note (Signed)
Subjective:  Swallowing 100% better. Wants to go home. Denies abdominal pain.  Objective: Vital signs in last 24 hours: Temp:  [97 F (36.1 C)-98 F (36.7 C)] 97.6 F (36.4 C) (11/23 0526) Pulse Rate:  [58-79] 64 (11/23 0526) Resp:  [11-21] 20 (11/23 0526) BP: (101-148)/(65-97) 125/67 mmHg (11/23 0526) SpO2:  [93 %-100 %] 97 % (11/23 0526) Last BM Date: 12/20/14 General:   Alert,  Well-developed, well-nourished, pleasant and cooperative in NAD Head:  Normocephalic and atraumatic. Eyes:  Sclera clear, no icterus.  Chest: CTA bilaterally without rales, rhonchi, crackles.    Heart:  Regular rate and rhythm; no murmurs, clicks, rubs,  or gallops. Abdomen:  Soft, nontender and nondistended. No masses, hepatosplenomegaly or hernias noted. Normal bowel sounds, without guarding, and without rebound.   Extremities:  Without clubbing, deformity or edema. Neurologic:  Alert and  oriented x4;  grossly normal neurologically. Skin:  Intact without significant lesions or rashes. Psych:  Alert and cooperative. Normal mood and affect.  Intake/Output from previous day: 11/22 0701 - 11/23 0700 In: 941.7 [P.O.:480; I.V.:261.7; IV Piggyback:200] Out: -  Intake/Output this shift:    Lab Results: CBC  Recent Labs  12/21/14 0710  WBC 6.1  HGB 13.3  HCT 39.2  MCV 96.8  PLT 323   BMET  Recent Labs  12/21/14 0710 12/23/14 0647  NA 136 134*  K 3.6 3.4*  CL 102 100*  CO2 29 26  GLUCOSE 98 130*  BUN 16 14  CREATININE 0.99 1.10  CALCIUM 8.9 8.6*   LFTs No results for input(s): BILITOT, BILIDIR, IBILI, ALKPHOS, AST, ALT, PROT, ALBUMIN in the last 72 hours. No results for input(s): LIPASE in the last 72 hours. PT/INR No results for input(s): LABPROT, INR in the last 72 hours.    Imaging Studies: Dg Chest Port 1 View  12/17/2014  CLINICAL DATA:  Preop cardiovascular exam. EXAM: PORTABLE CHEST 1 VIEW COMPARISON:  10/29/2011 FINDINGS: The heart size and mediastinal contours are within  normal limits. Both lungs are clear. The visualized skeletal structures are unremarkable. IMPRESSION: No active disease. Electronically Signed   By: Norva PavlovElizabeth  Brown M.D.   On: 12/17/2014 08:56   Dg Hand Complete Right  12/17/2014  CLINICAL DATA:  Cellulitis right thumb EXAM: RIGHT HAND - COMPLETE 3+ VIEW COMPARISON:  None. FINDINGS: Soft tissue swelling of the thumb. There is gas in the soft tissues compatible with infection and wound. Negative for osteomyelitis Negative for fracture or arthropathy IMPRESSION: Soft tissue swelling of the thumb. Negative for acute bony abnormality. Electronically Signed   By: Marlan Palauharles  Clark M.D.   On: 12/17/2014 07:57  [2 weeks]   Assessment: [age] year old male who presented to the ED with cellulitis and now undergoing second I&D of right thumbby Dr. Romeo AppleHarrison, with GI complaints of chronic dysphagia. Appears he has had year long symptoms of solid food dysphagia in the setting of chronic GERD, routine NSAIDs (ivuprofen) and aspirin powders. He now notes worsening symptoms, pointing to cervical esophagus, stating food is now "hung" up and more difficulty in tolerating solid foods since surgery on Friday.  Likely acute on chronic symptoms multifactorial, and it appears this worsened after intubation.    EGD yesterday revealed mid esophageal web with distal esophageal erosions, multiple small punctate ulcers in the gastric antrum and multiple small nonbleeding clean based and punctate ulcers in the duodenal bulb and mild duodenitis. Status post esophageal dilation. Biopsies taken around gastric ulcers.  As of note, also needs routine screening colonoscopy as  outpatient in an elective setting.   Plan: 1. Okay for discharge from GI standpoint. 2. Follow-up pending biopsies. 3. Twice a day PPI for 3 months, then daily forever. 4. Avoid NSAIDs for 7 days. 5. May continue aspirin due to history of CAD. 6. Repeat EGD in 3 months to assess that ulcers have healed and  consider esophageal dilation. 7. Consider routine screening colonoscopy at time of next EGD. 8. Return to office in 10 weeks for follow up and schedule EGD/TCS at that time.  Leanna Battles. Dixon Boos [facility] [phone] 11/23/20169:59 AM  REVIEWED. AGREE. NO ADDITIONAL RECOMMENDATIONS.    LOS: 8 days

## 2014-12-23 NOTE — Progress Notes (Signed)
Patient discharged with instructions given on medications,and follow up visits, patient verbalized understanding. Prescription sent to Pharmacy of choice documented on AVS.. Vital signs stable. Accompanied by staff to an awaiting vehicle.

## 2014-12-23 NOTE — Telephone Encounter (Signed)
Tammy from hospital called and cx this apptment pt is still in hospital and they may have to set up home health for this patient. NF 12/23/14

## 2014-12-23 NOTE — Care Management Note (Signed)
Case Management Note  Patient Details  Name: Cory Roberts MRN: [id number] Date of Birth: 17-Aug-1960  Subjective/Objective:                    Action/Plan:   Expected Discharge Date:                  Expected Discharge Plan:  Home w Home Health Services  In-House Referral:  NA  Discharge planning Services  CM Consult  Post Acute Care Choice:  Home Health Choice offered to:  Patient  DME Arranged:    DME Agency:     HH Arranged:  RN HH Agency:  [facility]  Status of Service:  Completed, signed off  Medicare Important Message Given:    Date Medicare IM Given:    Medicare IM give by:    Date Additional Medicare IM Given:    Additional Medicare Important Message give by:     If discussed at Long Length of Stay Meetings, dates discussed:    Additional Comments: [facility] Alroy BailiffLinda Lothian [facility] is aware and will collect the pts information from the chart. HH services to start on 12/25/14 for wound care. CM unable to arrange wound care at the [facility] outpt clinic due to clinic closed for holiday. Pt and pts nurse aware of discharge arrangements. [name], Jilliann Subramanian Strathmorerowder, RN 12/23/2014, 12:21 PM

## 2014-12-23 NOTE — Progress Notes (Signed)
  PROGRESS NOTE  Cory Roberts ZOX:[id number] DOB: [birth year] DOA: 12/15/2014 PCP: Cassell SmilesFUSCO,LAWRENCE J., MD  Summary: 54yom presented with right thumb pain with red streaking, failing outpatient treatment. Found to have abscess in ED. I&D in ED and admitted for further evaluation.  Assessment/Plan: 1. Right thumb abscess/cellulitis. S/p I&D 11/18 and 11/21. Much improved.  2. DYSPHAGIA DUE TO MID-ESOPHAGEAL WEB AND UNCONTROLLED REFLUX/ESOPHAGITIS   Overall improved. Plan home today.  F/u with Dr. Romeo AppleHarrison December 1  Continue wet-to-dry dressing changes every other day, MWF  BID PPI 3 months, then daily indefinitely. No NSAIDs for 7 days.  Continue aspirin.  Repeat EGD in 3 months, consider esophageal dilation  F/u with GI in 6 months.  Brendia Sacksaniel Janeene Sand, MD  [facility]  Pager [phone] If 7PM-7AM, please contact night-coverage at [email or web address], password [email or web address] 12/23/2014, 2:31 PM  LOS: 8 days   Consultants:  Orthopedics  GI  Procedures:  I&D in ED  11/18 INCISION AND DRAINAGE THUMB (Right)   11/21 INCISION AND DRAINAGE RIGHT THUMB (Right)   EGD ENDOSCOPIC IMPRESSION: 1. DYSPHAGIA DUE TO MID-ESOPHAGEAL WEB AND UNCONTROLLED REFLUX/ESOPHAGITIS 2. SMALL Hiatal hernia 4. Multiple small GASTRIC AND DUODENAL ulcers AND MILD DUODENITIS  Antibiotics:  Doxycycline 11/23 >> 12/4  Vancomycin 11/15 >> 11/23  HPI/Subjective: Feels good, no hand pain. Swallowing much better.  Objective: Filed Vitals:   12/22/14 1600 12/22/14 2213 12/23/14 0526 12/23/14 1426  BP: 106/70 107/65 125/67 114/67  Pulse: 66 63 64 73  Temp: 98 F (36.7 C) 97 F (36.1 C) 97.6 F (36.4 C) 97.4 F (36.3 C)  TempSrc:  Oral Oral   Resp: 20 20 20 20   Height:      Weight:      SpO2: 93% 98% 97% 99%    Intake/Output Summary (Last 24 hours) at 12/23/14 1431 Last data filed at 12/23/14 1300  Gross per 24 hour  Intake 1421.67 ml  Output      0 ml  Net 1421.67 ml     Filed  Weights   12/15/14 2247 12/18/14 0952 12/21/14 1135  Weight: 81.7 kg (180 lb 1.9 oz) 81.647 kg (180 lb) 81.647 kg (180 lb)    Exam:    General:  Appears calm and comfortable Cardiovascular: RRR, no m/r/g.   Respiratory: CTA bilaterally, no w/r/r. Normal respiratory effort. Skin: right hand bandaged. Fingers and thumb tip appear to have normal perfusion. Psychiatric: grossly normal mood and affect, speech fluent and appropriate  New data reviewed:  Potassium 3.4. Normal creatinine.   Scheduled Meds: . aspirin  325 mg Oral Daily  . Chlorhexidine Gluconate Cloth  6 each Topical Q0600  . docusate sodium  100 mg Oral BID  . mupirocin ointment  1 application Nasal BID  . pantoprazole  40 mg Oral BID AC  . potassium chloride  40 mEq Oral Daily  . promethazine  12.5 mg Intravenous Once  . sodium chloride  3 mL Intravenous Q12H  . vancomycin  1,000 mg Intravenous Q12H   Continuous Infusions:   Principal Problem:   Abscess of thumb Active Problems:   Obesity   Dyslipidemia   At risk for diabetes mellitus   Cellulitis and abscess   Abscess of right thumb   Dysphagia   PUD (peptic ulcer disease)

## 2014-12-29 ENCOUNTER — Encounter (HOSPITAL_COMMUNITY): Payer: Self-pay | Admitting: Gastroenterology

## 2014-12-31 ENCOUNTER — Encounter: Payer: Self-pay | Admitting: Orthopedic Surgery

## 2014-12-31 ENCOUNTER — Ambulatory Visit (INDEPENDENT_AMBULATORY_CARE_PROVIDER_SITE_OTHER): Payer: BLUE CROSS/BLUE SHIELD | Admitting: Orthopedic Surgery

## 2014-12-31 VITALS — BP 131/68 | Ht 68.0 in | Wt 180.0 lb

## 2014-12-31 DIAGNOSIS — Z4789 Encounter for other orthopedic aftercare: Secondary | ICD-10-CM

## 2014-12-31 DIAGNOSIS — L02511 Cutaneous abscess of right hand: Secondary | ICD-10-CM

## 2014-12-31 MED ORDER — DOXYCYCLINE HYCLATE 100 MG PO TABS: 100.0000 mg | ORAL_TABLET | Freq: Two times a day (BID) | ORAL | Status: DC

## 2014-12-31 NOTE — Progress Notes (Signed)
Postop visit  Infection right thumb since post incision drainage on November 21  The area is now 3 x 1 x 3 mm deep red granulation tissue extensor tendon intact  Patient on doxycycline  Continue dressing changes continue doxycycline return in one week  Will need to be out of work until the wound closes estimated time 3-4 weeks

## 2015-01-07 ENCOUNTER — Ambulatory Visit (INDEPENDENT_AMBULATORY_CARE_PROVIDER_SITE_OTHER): Payer: BLUE CROSS/BLUE SHIELD | Admitting: Orthopedic Surgery

## 2015-01-07 VITALS — BP 124/71 | Ht 68.0 in | Wt 180.0 lb

## 2015-01-07 DIAGNOSIS — L02511 Cutaneous abscess of right hand: Secondary | ICD-10-CM

## 2015-01-07 DIAGNOSIS — Z4789 Encounter for other orthopedic aftercare: Secondary | ICD-10-CM

## 2015-01-07 NOTE — Progress Notes (Signed)
Chief Complaint  Patient presents with  . Follow-up    1 week follow up Rt thumb, DOS 12/21/14    Wound : Superficial wound 3 mm wide by 6 mm long granulation tissue excellent. I instructed him on IP joint range of motion exercises to improve motion there Follow-up in a week continue out of work

## 2015-01-08 ENCOUNTER — Telehealth: Payer: Self-pay | Admitting: Gastroenterology

## 2015-01-08 NOTE — Telephone Encounter (Signed)
Please call pt. His stomach Bx shows gastritis & ULCERS ARE DUE TO ASA/IBUPROFEN USE.   TAKE PROTONIX BID. CONTINUE ASA. HE NEEDS A REPEAT EGD AND TCS IN FEB 2017.

## 2015-01-11 NOTE — Telephone Encounter (Signed)
OV MADE °

## 2015-01-12 NOTE — Telephone Encounter (Signed)
Letter mailed to the pt. 

## 2015-01-13 ENCOUNTER — Ambulatory Visit (INDEPENDENT_AMBULATORY_CARE_PROVIDER_SITE_OTHER): Payer: BLUE CROSS/BLUE SHIELD | Admitting: Orthopedic Surgery

## 2015-01-13 DIAGNOSIS — Z4789 Encounter for other orthopedic aftercare: Secondary | ICD-10-CM

## 2015-01-13 DIAGNOSIS — L02511 Cutaneous abscess of right hand: Secondary | ICD-10-CM

## 2015-01-13 NOTE — Progress Notes (Signed)
Follow-up status post surgery for infection right thumb  The wound is now 1 mm x 1 mm and only requires Band-Aid for dressing  He still has stiffness at the IP joint I reemphasized the need for active range of motion follow-up December 28 are ninth to return to work January 3

## 2015-01-28 ENCOUNTER — Telehealth: Payer: Self-pay | Admitting: Gastroenterology

## 2015-01-28 ENCOUNTER — Encounter: Payer: Self-pay | Admitting: Orthopedic Surgery

## 2015-01-28 ENCOUNTER — Ambulatory Visit (INDEPENDENT_AMBULATORY_CARE_PROVIDER_SITE_OTHER): Payer: BLUE CROSS/BLUE SHIELD | Admitting: Orthopedic Surgery

## 2015-01-28 VITALS — BP 130/84 | Ht 68.0 in | Wt 180.0 lb

## 2015-01-28 DIAGNOSIS — L02511 Cutaneous abscess of right hand: Secondary | ICD-10-CM

## 2015-01-28 DIAGNOSIS — Z4789 Encounter for other orthopedic aftercare: Secondary | ICD-10-CM

## 2015-01-28 MED ORDER — PANTOPRAZOLE SODIUM 40 MG PO TBEC: DELAYED_RELEASE_TABLET | ORAL | Status: DC

## 2015-01-28 NOTE — Progress Notes (Signed)
Chief Complaint  Patient presents with  . Follow-up    2 week follow up Rt thumb wound, DOI 12/21/14    BP 130/84 mmHg  Ht 5\' 8"  (1.727 m)  Wt 180 lb (81.647 kg)  BMI 27.38 kg/m2  Follow up I D abscess thumb.  The wound closed   The IP J is still stiff   Again I told him he needs to work on this   RTW: JAN 2

## 2015-01-28 NOTE — Patient Instructions (Signed)
Return to work Jan 2  

## 2015-01-28 NOTE — Telephone Encounter (Signed)
PT NEEDS REFILL. RX SENT. PLEASE CALL PT.

## 2015-03-08 ENCOUNTER — Encounter: Payer: Self-pay | Admitting: Gastroenterology

## 2015-03-08 ENCOUNTER — Other Ambulatory Visit: Payer: Self-pay

## 2015-03-08 ENCOUNTER — Ambulatory Visit (INDEPENDENT_AMBULATORY_CARE_PROVIDER_SITE_OTHER): Payer: BLUE CROSS/BLUE SHIELD | Admitting: Gastroenterology

## 2015-03-08 VITALS — BP 127/80 | HR 61 | Temp 97.8°F | Ht 68.0 in | Wt 188.8 lb

## 2015-03-08 DIAGNOSIS — K279 Peptic ulcer, site unspecified, unspecified as acute or chronic, without hemorrhage or perforation: Secondary | ICD-10-CM | POA: Diagnosis not present

## 2015-03-08 DIAGNOSIS — K219 Gastro-esophageal reflux disease without esophagitis: Secondary | ICD-10-CM

## 2015-03-08 DIAGNOSIS — Z1211 Encounter for screening for malignant neoplasm of colon: Secondary | ICD-10-CM | POA: Diagnosis not present

## 2015-03-08 MED ORDER — PEG 3350-KCL-NA BICARB-NACL 420 G PO SOLR: 4000.0000 mL | Freq: Once | ORAL | Status: DC

## 2015-03-08 NOTE — Progress Notes (Signed)
Referring Provider: Elfredia Nevins, MD Primary Care Physician:  Cassell Smiles., MD  Primary GI: Dr. Darrick Penna   Chief Complaint  Patient presents with  . Follow-up    HPI:   Cory Roberts is a 55 y.o. male presenting today with a history of chronic dysphagia, seen while inpatient for non-GI issue in Nov 2016 and undergoing EGD with findings of mid esophageal web s/p dilation, multiple small punctate ulcers in gastric antrum and multiple small, clean based ulcers in the duodenum. He needs a surveillance EGD now and a routine screening colonoscopy.   No dysphagia, abdominal pain, constipation, diarrhea. If he eats pizza or ketchup, it "comes right back up". Otherwise, he is doing well. Protonix BID. Aspirin 325 mg daily.  No prior colonoscopy.    Past Medical History  Diagnosis Date  . Raynaud's syndrome   . Arthritis Feb. 2013    Gout- Right foot  . Gout   . GERD (gastroesophageal reflux disease)   . CVA (cerebral infarction)   . Headache     Migraine Headaches    Past Surgical History  Procedure Laterality Date  . Incision and drainage of thumb  12/18/14  . Incision and drainage Right 12/18/2014    Procedure: INCISION AND DRAINAGE THUMB;  Surgeon: Vickki Hearing, MD;  Location: [facility];  Service: Orthopedics;  Laterality: Right;  . Incision and drainage Right 12/21/2014    Procedure: INCISION AND DRAINAGE RIGHT THUMB;  Surgeon: Vickki Hearing, MD;  Location: [facility];  Service: Orthopedics;  Laterality: Right;  . Hernia repair Left 2007  . Esophagogastroduodenoscopy N/A 12/22/2014    Dr. Darrick Penna: 1. dysphagia due to mid-esophageal web and uncontrolled reflux esophagitis. 2. small hiatal hernia 3. multiple small gastric and duodenal ulcers and mild duodenitis . Path with reactive gastropathy, negative H. pylori     Current Outpatient Prescriptions  Medication Sig Dispense Refill  . aspirin 325 MG tablet Take 1 tablet (325 mg total) by mouth daily. 90 tablet 3  .  pantoprazole (PROTONIX) 40 MG tablet 1 PO 30 MINUTES PRIOR TO MEALS BID FOR 3 MOS THEN QD 60 tablet 11   No current facility-administered medications for this visit.    Allergies as of 03/08/2015 - Review Complete 03/08/2015  Allergen Reaction Noted  . Codeine Nausea And Vomiting 05/08/2011  . Hydrocodone Nausea Only 10/28/2011    Family History  Problem Relation Age of Onset  . Cancer Mother   . Hyperlipidemia Mother   . Hypertension Mother   . Cancer Father   . Heart disease Father 59    Heart Disease before age 78  . Heart attack Father   . Diabetes Sister   . Cancer Sister   . Hyperlipidemia Sister   . Hypertension Sister   . Heart attack Mother   . Rheumatologic disease Mother   . Colon cancer Neg Hx     Social History   Social History  . Marital Status: Married    Spouse Name: N/A  . Number of Children: N/A  . Years of Education: N/A   Social History Main Topics  . Smoking status: Former Smoker -- 3.00 packs/day for 30 years    Types: Cigarettes    Quit date: 01/30/2005  . Smokeless tobacco: None  . Alcohol Use: No  . Drug Use: No  . Sexual Activity: Not Asked   Other Topics Concern  . None   Social History Narrative    Review of Systems: Negative unless mentioned in  HPI.   Physical Exam: BP 127/80 mmHg  Pulse 61  Temp(Src) 97.8 F (36.6 C) (Oral)  Ht  (1.727 m)  Wt 188 lb 12.8 oz (85.639 kg)  BMI 28.71 kg/m2 General:   Alert and oriented. No distress noted. Pleasant and cooperative.  Head:  Normocephalic and atraumatic. Eyes:  Conjuctiva clear without scleral icterus. Mouth:  Oral mucosa pink and moist. Good dentition. No lesions. Heart:  S1, S2 present without murmurs, rubs, or gallops. Regular rate and rhythm. Abdomen:  +BS, soft, non-tender and non-distended. No rebound or guarding. No HSM or masses noted. Msk:  Symmetrical without gross deformities. Normal posture. Extremities:  Without edema. Neurologic:  Alert and  oriented x4;   grossly normal neurologically. Psych:  Alert and cooperative. Normal mood and affect.

## 2015-03-08 NOTE — Patient Instructions (Signed)
We have scheduled you for an upper endoscopy with Dr. Darrick Penna to make sure the ulcers have healed. You will have a colonoscopy for screening purposes at the same time.

## 2015-03-12 NOTE — Assessment & Plan Note (Signed)
55 year old male with history of PUD in Nov 2016, due for surveillance now. Esophageal web dilation performed at time of EGD. No persisting dysphagia.   Proceed with upper endoscopy in the near future with Dr. Darrick Penna. The risks, benefits, and alternatives have been discussed in detail with patient. They have stated understanding and desire to proceed.  Continue Protonix BID

## 2015-03-12 NOTE — Assessment & Plan Note (Signed)
No prior screening colonoscopy. No concerning lower GI symptoms.  Proceed with colonoscopy at time of EGD with Dr. Darrick Penna in the near future. The risks, benefits, and alternatives have been discussed in detail with the patient. They state understanding and desire to proceed.

## 2015-03-15 NOTE — Progress Notes (Signed)
cc'ed to pcp °

## 2015-03-22 ENCOUNTER — Encounter (HOSPITAL_COMMUNITY)
Admission: RE | Disposition: A | Discharge status: Home / Self Care | Payer: Self-pay | Source: Ambulatory Visit | Attending: Gastroenterology

## 2015-03-22 ENCOUNTER — Encounter (HOSPITAL_COMMUNITY): Payer: Self-pay | Admitting: *Deleted

## 2015-03-22 ENCOUNTER — Ambulatory Visit (HOSPITAL_COMMUNITY)
Admission: RE | Admit: 2015-03-22 | Discharge: 2015-03-22 | Disposition: A | Discharge status: Home / Self Care | Payer: BLUE CROSS/BLUE SHIELD | Source: Ambulatory Visit | Attending: Gastroenterology | Admitting: Gastroenterology

## 2015-03-22 DIAGNOSIS — K5730 Diverticulosis of large intestine without perforation or abscess without bleeding: Secondary | ICD-10-CM | POA: Insufficient documentation

## 2015-03-22 DIAGNOSIS — K648 Other hemorrhoids: Secondary | ICD-10-CM | POA: Diagnosis not present

## 2015-03-22 DIAGNOSIS — Z1211 Encounter for screening for malignant neoplasm of colon: Secondary | ICD-10-CM | POA: Insufficient documentation

## 2015-03-22 DIAGNOSIS — K219 Gastro-esophageal reflux disease without esophagitis: Secondary | ICD-10-CM | POA: Diagnosis not present

## 2015-03-22 DIAGNOSIS — Z8673 Personal history of transient ischemic attack (TIA), and cerebral infarction without residual deficits: Secondary | ICD-10-CM | POA: Insufficient documentation

## 2015-03-22 DIAGNOSIS — K222 Esophageal obstruction: Secondary | ICD-10-CM | POA: Diagnosis not present

## 2015-03-22 DIAGNOSIS — K2980 Duodenitis without bleeding: Secondary | ICD-10-CM | POA: Diagnosis not present

## 2015-03-22 DIAGNOSIS — Z79899 Other long term (current) drug therapy: Secondary | ICD-10-CM | POA: Insufficient documentation

## 2015-03-22 DIAGNOSIS — M1A9XX0 Chronic gout, unspecified, without tophus (tophi): Secondary | ICD-10-CM | POA: Insufficient documentation

## 2015-03-22 DIAGNOSIS — K2970 Gastritis, unspecified, without bleeding: Secondary | ICD-10-CM | POA: Diagnosis not present

## 2015-03-22 DIAGNOSIS — Z7982 Long term (current) use of aspirin: Secondary | ICD-10-CM | POA: Insufficient documentation

## 2015-03-22 DIAGNOSIS — K449 Diaphragmatic hernia without obstruction or gangrene: Secondary | ICD-10-CM | POA: Insufficient documentation

## 2015-03-22 DIAGNOSIS — I7300 Raynaud's syndrome without gangrene: Secondary | ICD-10-CM | POA: Diagnosis not present

## 2015-03-22 DIAGNOSIS — Z87891 Personal history of nicotine dependence: Secondary | ICD-10-CM | POA: Diagnosis not present

## 2015-03-22 DIAGNOSIS — M1990 Unspecified osteoarthritis, unspecified site: Secondary | ICD-10-CM | POA: Diagnosis not present

## 2015-03-22 HISTORY — PX: ESOPHAGOGASTRODUODENOSCOPY: SHX5428

## 2015-03-22 HISTORY — PX: COLONOSCOPY: SHX5424

## 2015-03-22 SURGERY — COLONOSCOPY
Anesthesia: Moderate Sedation

## 2015-03-22 MED ORDER — SODIUM CHLORIDE 0.9 % IV SOLN
INTRAVENOUS | Status: DC
  Administered 2015-03-22: 11:00:00 via INTRAVENOUS

## 2015-03-22 MED ORDER — LIDOCAINE VISCOUS 2 % MT SOLN
OROMUCOSAL | Status: AC
  Filled 2015-03-22: qty 15

## 2015-03-22 MED ORDER — MIDAZOLAM HCL 5 MG/5ML IJ SOLN
INTRAMUSCULAR | Status: AC
  Filled 2015-03-22: qty 10

## 2015-03-22 MED ORDER — MEPERIDINE HCL 100 MG/ML IJ SOLN
INTRAMUSCULAR | Status: AC
  Filled 2015-03-22: qty 2

## 2015-03-22 MED ORDER — MIDAZOLAM HCL 5 MG/5ML IJ SOLN
INTRAMUSCULAR | Status: DC | PRN
  Administered 2015-03-22 (×2): 2 mg via INTRAVENOUS
  Administered 2015-03-22: 1 mg via INTRAVENOUS

## 2015-03-22 MED ORDER — MEPERIDINE HCL 100 MG/ML IJ SOLN
INTRAMUSCULAR | Status: DC | PRN
  Administered 2015-03-22 (×2): 25 mg

## 2015-03-22 NOTE — Op Note (Signed)
[facility] [address] [place], [address]   ENDOSCOPY PROCEDURE REPORT  PATIENT: Cory Roberts, Cory Roberts  MR#: [id number] BIRTHDATE: 01-Oct-1960 , 55  yrs. old GENDER: male  ENDOSCOPIST: West Bali, MD REFERRED XB:JYNWG Sherwood Gambler, M.D.  PROCEDURE DATE: 03/27/2015 PROCEDURE:   EGD, diagnostic  INDICATIONS:screening for ulcers. MEDICATIONS: TCS+ Demerol 25 mg IV  MD STARTED SEDATION: 1202. PROCEDURE COMPLETE: 1234 TOPICAL ANESTHETIC:   Viscous Xylocaine ASA CLASS:  DESCRIPTION OF PROCEDURE:     Physical exam was performed.  Informed consent was obtained from the patient after explaining the benefits, risks, and alternatives to the procedure.  The patient was connected to the monitor and placed in the left lateral position.  Continuous oxygen was provided by nasal cannula and IV medicine administered through an indwelling cannula.  After administration of sedation, the patients esophagus was intubated and the EC-[id number] ([id number])  endoscope was advanced under direct visualization to the second portion of the duodenum.  The scope was removed slowly by carefully examining the color, texture, anatomy, and integrity of the mucosa on the way out.  The patient was recovered in endoscopy and discharged home in satisfactory condition.  Estimated blood loss is zero unless otherwise noted in this procedure report.    ESOPHAGUS: There was a stricture at the gastroesophageal junction. The stricture was easily traversable.   STOMACH: A medium sized hiatal hernia was noted.   Mild non-erosive gastritis (inflammation) was found in the gastric antrum.   DUODENUM: Moderate duodenal inflammation was found in the duodenal bulb. The duodenal mucosa showed no abnormalities. COMPLICATIONS: There were no immediate complications.  ENDOSCOPIC IMPRESSION: 1.   Stricture at the gastroesophageal junction 2.   Medium sized hiatal hernia 3.   MILD Non-erosive gastritis AND MODERATE  DUODENITIS  RECOMMENDATIONS: FOLLOW A HIGH FIBER/LOW FAT DIET. AVOID ITEMS THAT TRIGGER GASTRITIS/DUODENITIS IF NOT MEDICALLY NECESSARY. CONTINUE PROTONIX.  TAKE 30 MINUTES PRIOR TO MEALS TWICE DAILY. Next colonoscopy in 10 years.  REPEAT EXAM:  eSigned:  West Bali, MD 27-Mar-2015 8:26 PM     CPT CODES: ICD CODES:  The ICD and CPT codes recommended by this software are interpretations from the data that the clinical staff has captured with the software.  The verification of the translation of this report to the ICD and CPT codes and modifiers is the sole responsibility of the health care institution and practicing physician where this report was generated.  [employer] will not be held responsible for the validity of the ICD and CPT codes included on this report.  AMA assumes no liability for data contained or not contained herein. CPT is a Publishing rights manager of the [employer].

## 2015-03-22 NOTE — H&P (Addendum)
Primary Care Physician:  Cassell Smiles., MD Primary Gastroenterologist:  Dr. Darrick Penna  Pre-Procedure History & Physical: HPI:  Cory Roberts is a 55 y.o. male here for SCREENING FOR COLON CA AND PUD.  Past Medical History  Diagnosis Date  . Raynaud's syndrome   . Arthritis Feb. 2013    Gout- Right foot  . Gout   . GERD (gastroesophageal reflux disease)   . CVA (cerebral infarction)   . Headache     Migraine Headaches    Past Surgical History  Procedure Laterality Date  . Incision and drainage of thumb  12/18/14  . Incision and drainage Right 12/18/2014    Procedure: INCISION AND DRAINAGE THUMB;  Surgeon: Vickki Hearing, MD;  Location: [facility];  Service: Orthopedics;  Laterality: Right;  . Incision and drainage Right 12/21/2014    Procedure: INCISION AND DRAINAGE RIGHT THUMB;  Surgeon: Vickki Hearing, MD;  Location: [facility];  Service: Orthopedics;  Laterality: Right;  . Hernia repair Left 2007  . Esophagogastroduodenoscopy N/A 12/22/2014    Dr. Darrick Penna: 1. dysphagia due to mid-esophageal web and uncontrolled reflux esophagitis. 2. small hiatal hernia 3. multiple small gastric and duodenal ulcers and mild duodenitis . Path with reactive gastropathy, negative H. pylori     Prior to Admission medications   Medication Sig Start Date End Date Taking? Authorizing Provider  aspirin 325 MG tablet Take 1 tablet (325 mg total) by mouth daily. 10/31/11  Yes Renae Fickle, MD  pantoprazole (PROTONIX) 40 MG tablet 1 PO 30 MINUTES PRIOR TO MEALS BID FOR 3 MOS THEN QD Patient taking differently: Take 40 mg by mouth 2 (two) times daily. 1 PO 30 MINUTES PRIOR TO MEALS BID FOR 3 MOS THEN QD 01/28/15  Yes West Bali, MD  polyethylene glycol-electrolytes (NULYTELY/GOLYTELY) 420 g solution Take 4,000 mLs by mouth once. 03/08/15  Yes Nira Retort, NP    Allergies as of 03/08/2015 - Review Complete 03/08/2015  Allergen Reaction Noted  . Codeine Nausea And Vomiting 05/08/2011  . Hydrocodone  Nausea Only 10/28/2011    Family History  Problem Relation Age of Onset  . Cancer Mother   . Hyperlipidemia Mother   . Hypertension Mother   . Cancer Father   . Heart disease Father 55    Heart Disease before age 34  . Heart attack Father   . Diabetes Sister   . Cancer Sister   . Hyperlipidemia Sister   . Hypertension Sister   . Heart attack Mother   . Rheumatologic disease Mother   . Colon cancer Neg Hx     Social History   Social History  . Marital Status: Married    Spouse Name: N/A  . Number of Children: N/A  . Years of Education: N/A   Occupational History  . Not on file.   Social History Main Topics  . Smoking status: Former Smoker -- 3.00 packs/day for 30 years    Types: Cigarettes    Quit date: 01/30/2005  . Smokeless tobacco: Not on file  . Alcohol Use: No  . Drug Use: No  . Sexual Activity: Not on file   Other Topics Concern  . Not on file   Social History Narrative    Review of Systems: See HPI, otherwise negative ROS   Physical Exam: BP 124/86 mmHg  Pulse 78  Temp(Src) 97.8 F (36.6 C) (Oral)  Resp 22  Ht  (1.727 m)  Wt 188 lb (85.276 kg)  BMI 28.59 kg/m2  SpO2 98% General:   Alert,  pleasant and cooperative in NAD Head:  Normocephalic and atraumatic. Neck:  Supple; Lungs:  Clear throughout to auscultation.    Heart:  Regular rate and rhythm. Abdomen:  Soft, nontender and nondistended. Normal bowel sounds, without guarding, and without rebound.   Neurologic:  Alert and  oriented x4;  grossly normal neurologically.  Impression/Plan:     SCREENING FOR COLON CA AND PUD  PLAN:  1.EGD/TCS TODAY. DISCUSSED PROCEDURE, BENEFITS, & RISKS: < 1% chance of medication reaction, bleeding, perforation, or rupture of spleen/liver.

## 2015-03-22 NOTE — Discharge Instructions (Signed)
You have AN ESOPHAGEAL STRICTURE, A MODERATE SIZE HIATAL HERNIA, gastritis & DUODENITIS DUE TO ASPIRIN USE. YOU DID NOT HAVE ANY POLYPS. You have SMALL Internal AND LARGE EXTERNAL HEMORRHOIDS & diverticulosis IN YOUR LEFT COLON.     FOLLOW A HIGH FIBER/LOW FAT DIET. AVOID ITEMS THAT CAUSE BLOATING. SEE INFO BELOW.  AVOID ITEMS THAT TRIGGER GASTRITIS/DUODENITIS IF NOT MEDICALLYNCESSARY. SEE INFO BELOW.  CONTINUE PROTONIX. TAKE 30 MINUTES PRIOR TO MEALS TWICE DAILY.  Next colonoscopy in 10 years.    ENDOSCOPY Care After Read the instructions outlined below and refer to this sheet in the next week. These discharge instructions provide you with general information on caring for yourself after you leave the hospital. While your treatment has been planned according to the most current medical practices available, unavoidable complications occasionally occur. If you have any problems or questions after discharge, call DR. FIELDS, [phone].  ACTIVITY  You may resume your regular activity, but move at a slower pace for the next 24 hours.   Take frequent rest periods for the next 24 hours.   Walking will help get rid of the air and reduce the bloated feeling in your belly (abdomen).   No driving for 24 hours (because of the medicine (anesthesia) used during the test).   You may shower.   Do not sign any important legal documents or operate any machinery for 24 hours (because of the anesthesia used during the test).    NUTRITION  Drink plenty of fluids.   You may resume your normal diet as instructed by your doctor.   Begin with a light meal and progress to your normal diet. Heavy or fried foods are harder to digest and may make you feel sick to your stomach (nauseated).   Avoid alcoholic beverages for 24 hours or as instructed.    MEDICATIONS  You may resume your normal medications.   WHAT YOU CAN EXPECT TODAY  Some feelings of bloating in the abdomen.   Passage of more  gas than usual.   Spotting of blood in your stool or on the toilet paper  .  IF YOU HAD POLYPS REMOVED DURING THE ENDOSCOPY:  Eat a soft diet IF YOU HAVE NAUSEA, BLOATING, ABDOMINAL PAIN, OR VOMITING.    FINDING OUT THE RESULTS OF YOUR TEST Not all test results are available during your visit. DR. Darrick Penna WILL CALL YOU WITHIN 7 DAYS OF YOUR PROCEDUE WITH YOUR RESULTS. Do not assume everything is normal if you have not heard from DR. FIELDS IN ONE WEEK, CALL HER OFFICE AT [phone].  SEEK IMMEDIATE MEDICAL ATTENTION AND CALL THE OFFICE: [phone] IF:  You have more than a spotting of blood in your stool.   Your belly is swollen (abdominal distention).   You are nauseated or vomiting.   You have a temperature over 101F.   You have abdominal pain or discomfort that is severe or gets worse throughout the day.   Gastritis/DUODENITIS  Gastritis is an inflammation (the body's way of reacting to injury and/or infection) of the stomach. DUODENITIS is an inflammation (the body's way of reacting to injury and/or infection) of the FIRST PART OF THE SMALL INTESTINES. It is often caused by bacterial (germ) infections. It can also be caused BY ASPIRIN, BC/GOODY POWDER'S, (IBUPROFEN) MOTRIN, OR ALEVE (NAPROXEN), chemicals (including alcohol), SPICY FOODS, and medications. This illness may be associated with generalized malaise (feeling tired, not well), UPPER ABDOMINAL STOMACH cramps, and fever. One common bacterial cause of gastritis is an organism known  as H. Pylori. This can be treated with antibiotics.   High-Fiber Diet A high-fiber diet changes your normal diet to include more whole grains, legumes, fruits, and vegetables. Changes in the diet involve replacing refined carbohydrates with unrefined foods. The calorie level of the diet is essentially unchanged. The Dietary Reference Intake (recommended amount) for adult males is 38 grams per day. For adult females, it is 25 grams per day.  Pregnant and lactating women should consume 28 grams of fiber per day. Fiber is the intact part of a plant that is not broken down during digestion. Functional fiber is fiber that has been isolated from the plant to provide a beneficial effect in the body. PURPOSE  Increase stool bulk.   Ease and regulate bowel movements.   Lower cholesterol.  REDUCE RISK OF COLON CANCER  INDICATIONS THAT YOU NEED MORE FIBER  Constipation and hemorrhoids.   Uncomplicated diverticulosis (intestine condition) and irritable bowel syndrome.   Weight management.   As a protective measure against hardening of the arteries (atherosclerosis), diabetes, and cancer.   GUIDELINES FOR INCREASING FIBER IN THE DIET  Start adding fiber to the diet slowly. A gradual increase of about 5 more grams (2 slices of whole-wheat bread, 2 servings of most fruits or vegetables, or 1 bowl of high-fiber cereal) per day is best. Too rapid an increase in fiber may result in constipation, flatulence, and bloating.   Drink enough water and fluids to keep your urine clear or pale yellow. Water, juice, or caffeine-free drinks are recommended. Not drinking enough fluid may cause constipation.   Eat a variety of high-fiber foods rather than one type of fiber.   Try to increase your intake of fiber through using high-fiber foods rather than fiber pills or supplements that contain small amounts of fiber.   The goal is to change the types of food eaten. Do not supplement your present diet with high-fiber foods, but replace foods in your present diet.   INCLUDE A VARIETY OF FIBER SOURCES  Replace refined and processed grains with whole grains, canned fruits with fresh fruits, and incorporate other fiber sources. White rice, white breads, and most bakery goods contain little or no fiber.   Brown whole-grain rice, buckwheat oats, and many fruits and vegetables are all good sources of fiber. These include: broccoli, Brussels sprouts,  cabbage, cauliflower, beets, sweet potatoes, white potatoes (skin on), carrots, tomatoes, eggplant, squash, berries, fresh fruits, and dried fruits.   Cereals appear to be the richest source of fiber. Cereal fiber is found in whole grains and bran. Bran is the fiber-rich outer coat of cereal grain, which is largely removed in refining. In whole-grain cereals, the bran remains. In breakfast cereals, the largest amount of fiber is found in those with "bran" in their names. The fiber content is sometimes indicated on the label.   You may need to include additional fruits and vegetables each day.   In baking, for 1 cup white flour, you may use the following substitutions:   1 cup whole-wheat flour minus 2 tablespoons.   1/2 cup white flour plus 1/2 cup whole-wheat flour.   Diverticulosis Diverticulosis is a common condition that develops when small pouches (diverticula) form in the wall of the colon. The risk of diverticulosis increases with age. It happens more often in people who eat a low-fiber diet. Most individuals with diverticulosis have no symptoms. Those individuals with symptoms usually experience belly (abdominal) pain, constipation, or loose stools (diarrhea).  HOME CARE INSTRUCTIONS  Increase the amount of fiber in your diet as directed by your caregiver or dietician. This may reduce symptoms of diverticulosis.   Drink at least 6 to 8 glasses of water each day to prevent constipation.   Try not to strain when you have a bowel movement.   Avoiding nuts and seeds to prevent complications is NOT NECESSARY.   FOODS HAVING HIGH FIBER CONTENT INCLUDE:  Fruits. Apple, peach, pear, tangerine, raisins, prunes.   Vegetables. Brussels sprouts, asparagus, broccoli, cabbage, carrot, cauliflower, romaine lettuce, spinach, summer squash, tomato, winter squash, zucchini.   Starchy Vegetables. Baked beans, kidney beans, lima beans, split peas, lentils, potatoes (with skin).   Grains. Whole  wheat bread, brown rice, bran flake cereal, plain oatmeal, white rice, shredded wheat, bran muffins.   SEEK IMMEDIATE MEDICAL CARE IF:  You develop increasing pain or severe bloating.   You have an oral temperature above 101F.   You develop vomiting or bowel movements that are bloody or black.    Hemorrhoids Hemorrhoids are dilated (enlarged) veins around the rectum. Sometimes clots will form in the veins. This makes them swollen and painful. These are called thrombosed hemorrhoids. Causes of hemorrhoids include:  Constipation.   Straining to have a bowel movement.   HEAVY LIFTING  HOME CARE INSTRUCTIONS  Eat a well balanced diet and drink 6 to 8 glasses of water every day to avoid constipation. You may also use a bulk laxative.   Avoid straining to have bowel movements.   Keep anal area dry and clean.   Do not use a donut shaped pillow or sit on the toilet for long periods. This increases blood pooling and pain.   Move your bowels when your body has the urge; this will require less straining and will decrease pain and pressure.  PATIENT INSTRUCTIONS POST-ANESTHESIA  IMMEDIATELY FOLLOWING SURGERY:  Do not drive or operate machinery for the first twenty four hours after surgery.  Do not make any important decisions for twenty four hours after surgery or while taking narcotic pain medications or sedatives.  If you develop intractable nausea and vomiting or a severe headache please notify your doctor immediately.  FOLLOW-UP:  Please make an appointment with your surgeon as instructed. You do not need to follow up with anesthesia unless specifically instructed to do so.  WOUND CARE INSTRUCTIONS (if applicable):  Keep a dry clean dressing on the anesthesia/puncture wound site if there is drainage.  Once the wound has quit draining you may leave it open to air.  Generally you should leave the bandage intact for twenty four hours unless there is drainage.  If the epidural site drains  for more than 36-48 hours please call the anesthesia department.  QUESTIONS?:  Please feel free to call your physician or the hospital operator if you have any questions, and they will be happy to assist you.

## 2015-03-22 NOTE — Op Note (Addendum)
[facility] [address] [place], [address]   COLONOSCOPY PROCEDURE REPORT  PATIENT: Cory, Roberts  MR#: [id number] BIRTHDATE: [birth year] , 55  yrs. old GENDER: male ENDOSCOPIST: West Bali, MD REFERRED XB:JYNWG Sherwood Gambler, M.D. PROCEDURE DATE:  04/19/2015 PROCEDURE:   Colonoscopy, screening INDICATIONS:average risk patient for colon cancer. MEDICATIONS: Demerol 50 mg IV and Versed 5 mg IV MD STARTED SEDATION: 1202. PROCEDURE COMPLETE:  1234  DESCRIPTION OF PROCEDURE:    Physical exam was performed.  Informed consent was obtained from the patient after explaining the benefits, risks, and alternatives to procedure.  The patient was connected to monitor and placed in left lateral position. Continuous oxygen was provided by nasal cannula and IV medicine administered through an indwelling cannula.  After administration of sedation and rectal exam, the patients rectum was intubated and the EC-[id number] ([id number])  colonoscope was advanced under direct visualization to the cecum.  The scope was removed slowly by carefully examining the color, texture, anatomy, and integrity mucosa on the way out.  The patient was recovered in endoscopy and discharged home in satisfactory condition. Estimated blood loss is zero unless otherwise noted in this procedure report.    COLON FINDINGS: There was mild diverticulosis noted in the sigmoid colon with associated angulation.  , The examination was otherwise normal.  , and Moderate sized internal hemorrhoids were found.  PREP QUALITY: excellent.  CECAL W/D TIME: 7       minutes COMPLICATIONS: None  ENDOSCOPIC IMPRESSION: 1.   Mild diverticulosis in the sigmoid colon 2.   Moderate sized internal hemorrhoids  RECOMMENDATIONS: FOLLOW A HIGH FIBER/LOW FAT DIET. AVOID ITEMS THAT TRIGGER GASTRITIS/DUODENITIS IF NOT MEDICALLY NeCESSARY. CONTINUE PROTONIX.  TAKE 30 MINUTES PRIOR TO MEALS TWICE DAILY. Next colonoscopy in 10  years.      _______________________________ Rosalie DoctorWest Bali, MD 04/19/15 8:27 PM Revised: 2015-04-19 8:27 PM   CPT CODES: ICD CODES:  The ICD and CPT codes recommended by this software are interpretations from the data that the clinical staff has captured with the software.  The verification of the translation of this report to the ICD and CPT codes and modifiers is the sole responsibility of the health care institution and practicing physician where this report was generated.  [employer] will not be held responsible for the validity of the ICD and CPT codes included on this report.  AMA assumes no liability for data contained or not contained herein. CPT is a Publishing rights manager of the [employer].

## 2015-03-22 NOTE — Progress Notes (Signed)
REVIEWED-NO ADDITIONAL RECOMMENDATIONS. 

## 2015-03-25 ENCOUNTER — Encounter (HOSPITAL_COMMUNITY): Payer: Self-pay | Admitting: Gastroenterology

## 2015-05-06 DIAGNOSIS — E785 Hyperlipidemia, unspecified: Secondary | ICD-10-CM | POA: Diagnosis not present

## 2015-05-06 DIAGNOSIS — R7301 Impaired fasting glucose: Secondary | ICD-10-CM | POA: Diagnosis not present

## 2015-05-06 DIAGNOSIS — I639 Cerebral infarction, unspecified: Secondary | ICD-10-CM | POA: Diagnosis not present

## 2015-05-06 DIAGNOSIS — Z008 Encounter for other general examination: Secondary | ICD-10-CM | POA: Diagnosis not present

## 2015-06-24 DIAGNOSIS — E785 Hyperlipidemia, unspecified: Secondary | ICD-10-CM | POA: Diagnosis not present

## 2015-06-24 DIAGNOSIS — E559 Vitamin D deficiency, unspecified: Secondary | ICD-10-CM | POA: Diagnosis not present

## 2015-06-24 DIAGNOSIS — Z79899 Other long term (current) drug therapy: Secondary | ICD-10-CM | POA: Diagnosis not present

## 2015-07-01 DIAGNOSIS — E559 Vitamin D deficiency, unspecified: Secondary | ICD-10-CM | POA: Diagnosis not present

## 2015-07-01 DIAGNOSIS — E785 Hyperlipidemia, unspecified: Secondary | ICD-10-CM | POA: Diagnosis not present

## 2015-07-01 DIAGNOSIS — R7301 Impaired fasting glucose: Secondary | ICD-10-CM | POA: Diagnosis not present

## 2015-10-28 DIAGNOSIS — Z139 Encounter for screening, unspecified: Secondary | ICD-10-CM | POA: Diagnosis not present

## 2015-10-28 DIAGNOSIS — E785 Hyperlipidemia, unspecified: Secondary | ICD-10-CM | POA: Diagnosis not present

## 2015-10-28 DIAGNOSIS — Z79899 Other long term (current) drug therapy: Secondary | ICD-10-CM | POA: Diagnosis not present

## 2015-10-28 DIAGNOSIS — E559 Vitamin D deficiency, unspecified: Secondary | ICD-10-CM | POA: Diagnosis not present

## 2015-11-04 DIAGNOSIS — E785 Hyperlipidemia, unspecified: Secondary | ICD-10-CM | POA: Diagnosis not present

## 2015-11-04 DIAGNOSIS — R7301 Impaired fasting glucose: Secondary | ICD-10-CM | POA: Diagnosis not present

## 2015-11-04 DIAGNOSIS — E876 Hypokalemia: Secondary | ICD-10-CM | POA: Diagnosis not present

## 2015-11-04 DIAGNOSIS — Z008 Encounter for other general examination: Secondary | ICD-10-CM | POA: Diagnosis not present

## 2015-11-04 DIAGNOSIS — I639 Cerebral infarction, unspecified: Secondary | ICD-10-CM | POA: Diagnosis not present

## 2015-11-18 DIAGNOSIS — E876 Hypokalemia: Secondary | ICD-10-CM | POA: Diagnosis not present

## 2015-11-25 DIAGNOSIS — E876 Hypokalemia: Secondary | ICD-10-CM | POA: Diagnosis not present

## 2015-12-09 DIAGNOSIS — E876 Hypokalemia: Secondary | ICD-10-CM | POA: Diagnosis not present

## 2016-02-29 ENCOUNTER — Other Ambulatory Visit: Payer: Self-pay | Admitting: Gastroenterology

## 2016-05-11 DIAGNOSIS — E785 Hyperlipidemia, unspecified: Secondary | ICD-10-CM | POA: Diagnosis not present

## 2016-05-11 DIAGNOSIS — R7301 Impaired fasting glucose: Secondary | ICD-10-CM | POA: Diagnosis not present

## 2016-05-11 DIAGNOSIS — I639 Cerebral infarction, unspecified: Secondary | ICD-10-CM | POA: Diagnosis not present

## 2016-05-11 DIAGNOSIS — Z719 Counseling, unspecified: Secondary | ICD-10-CM | POA: Diagnosis not present

## 2016-05-11 DIAGNOSIS — Z008 Encounter for other general examination: Secondary | ICD-10-CM | POA: Diagnosis not present

## 2016-05-11 DIAGNOSIS — Z139 Encounter for screening, unspecified: Secondary | ICD-10-CM | POA: Diagnosis not present

## 2016-05-25 DIAGNOSIS — E785 Hyperlipidemia, unspecified: Secondary | ICD-10-CM | POA: Diagnosis not present

## 2016-05-25 DIAGNOSIS — R748 Abnormal levels of other serum enzymes: Secondary | ICD-10-CM | POA: Diagnosis not present

## 2016-06-22 DIAGNOSIS — Z139 Encounter for screening, unspecified: Secondary | ICD-10-CM | POA: Diagnosis not present

## 2016-06-22 DIAGNOSIS — R748 Abnormal levels of other serum enzymes: Secondary | ICD-10-CM | POA: Diagnosis not present

## 2016-06-29 DIAGNOSIS — Z139 Encounter for screening, unspecified: Secondary | ICD-10-CM | POA: Diagnosis not present

## 2016-06-29 DIAGNOSIS — R748 Abnormal levels of other serum enzymes: Secondary | ICD-10-CM | POA: Diagnosis not present

## 2016-07-06 DIAGNOSIS — Z1389 Encounter for screening for other disorder: Secondary | ICD-10-CM | POA: Diagnosis not present

## 2016-07-06 DIAGNOSIS — M1611 Unilateral primary osteoarthritis, right hip: Secondary | ICD-10-CM | POA: Diagnosis not present

## 2016-07-06 DIAGNOSIS — Z6827 Body mass index (BMI) 27.0-27.9, adult: Secondary | ICD-10-CM | POA: Diagnosis not present

## 2016-07-06 DIAGNOSIS — M87851 Other osteonecrosis, right femur: Secondary | ICD-10-CM | POA: Diagnosis not present

## 2016-08-09 DIAGNOSIS — M87051 Idiopathic aseptic necrosis of right femur: Secondary | ICD-10-CM | POA: Diagnosis not present

## 2016-08-18 DIAGNOSIS — Z6826 Body mass index (BMI) 26.0-26.9, adult: Secondary | ICD-10-CM | POA: Diagnosis not present

## 2016-08-18 DIAGNOSIS — I679 Cerebrovascular disease, unspecified: Secondary | ICD-10-CM | POA: Diagnosis not present

## 2016-08-18 DIAGNOSIS — Z1389 Encounter for screening for other disorder: Secondary | ICD-10-CM | POA: Diagnosis not present

## 2016-08-18 DIAGNOSIS — M1611 Unilateral primary osteoarthritis, right hip: Secondary | ICD-10-CM | POA: Diagnosis not present

## 2016-08-18 DIAGNOSIS — R7309 Other abnormal glucose: Secondary | ICD-10-CM | POA: Diagnosis not present

## 2016-08-18 DIAGNOSIS — E663 Overweight: Secondary | ICD-10-CM | POA: Diagnosis not present

## 2016-08-20 ENCOUNTER — Ambulatory Visit: Payer: Self-pay | Admitting: Orthopedic Surgery

## 2016-08-31 ENCOUNTER — Ambulatory Visit: Payer: Self-pay | Admitting: Orthopedic Surgery

## 2016-08-31 NOTE — H&P (Signed)
TOTAL HIP ADMISSION H&P  Patient is admitted for right total hip arthroplasty.  Subjective:  Chief Complaint: right hip pain  HPI: Cory Roberts, 56 y.o. male, has a history of pain and functional disability in the right hip(s) due to avn and patient has failed non-surgical conservative treatments for greater than 12 weeks to include NSAID's and/or analgesics, flexibility and strengthening excercises, use of assistive devices, weight reduction as appropriate and activity modification.  Onset of symptoms was gradual starting 4 years ago with gradually worsening course since that time.The patient noted no past surgery on the right hip(s).  Patient currently rates pain in the right hip at 10 out of 10 with activity. Patient has night pain, worsening of pain with activity and weight bearing, trendelenberg gait, pain that interfers with activities of daily living, pain with passive range of motion and crepitus. Patient has evidence of subchondral cysts, subchondral sclerosis, periarticular osteophytes, joint subluxation and joint space narrowing by imaging studies. This condition presents safety issues increasing the risk of falls. This patient has had avascular necrosis of the hip.  There is no current active infection.  Patient Active Problem List   Diagnosis Date Noted  . Encounter for screening colonoscopy 03/08/2015  . PUD (peptic ulcer disease)   . Dysphagia   . Abscess of right thumb 12/18/2014  . Abscess of thumb 12/15/2014  . Cellulitis and abscess 12/15/2014  . Obesity 10/31/2011  . Dyslipidemia 10/31/2011  . At risk for diabetes mellitus 10/31/2011  . Migraine with aura 10/29/2011  . CVA (cerebral infarction) 10/28/2011  . Vertigo 10/28/2011  . Pain in limb 05/15/2011   Past Medical History:  Diagnosis Date  . Arthritis Feb. 2013   Gout- Right foot  . CVA (cerebral infarction)   . GERD (gastroesophageal reflux disease)   . Gout   . Headache    Migraine Headaches  . Raynaud's  syndrome     Past Surgical History:  Procedure Laterality Date  . COLONOSCOPY N/A 03/22/2015   Procedure: COLONOSCOPY;  Surgeon: West BaliSandi L Fields, MD;  Location: [facility];  Service: Endoscopy;  Laterality: N/A;  1215  . ESOPHAGOGASTRODUODENOSCOPY N/A 12/22/2014   Dr. Darrick PennaFields: 1. dysphagia due to mid-esophageal web and uncontrolled reflux esophagitis. 2. small hiatal hernia 3. multiple small gastric and duodenal ulcers and mild duodenitis . Path with reactive gastropathy, negative H. pylori   . ESOPHAGOGASTRODUODENOSCOPY N/A 03/22/2015   Procedure: ESOPHAGOGASTRODUODENOSCOPY (EGD);  Surgeon: West BaliSandi L Fields, MD;  Location: [facility];  Service: Endoscopy;  Laterality: N/A;  . HERNIA REPAIR Left 2007  . INCISION AND DRAINAGE Right 12/18/2014   Procedure: INCISION AND DRAINAGE THUMB;  Surgeon: Vickki HearingStanley E Harrison, MD;  Location: [facility] ORS;  Service: Orthopedics;  Laterality: Right;  . INCISION AND DRAINAGE Right 12/21/2014   Procedure: INCISION AND DRAINAGE RIGHT THUMB;  Surgeon: Vickki HearingStanley E Harrison, MD;  Location: [facility] ORS;  Service: Orthopedics;  Laterality: Right;  . incision and drainage of thumb  12/18/14     (Not in a hospital admission) Allergies  Allergen Reactions  . Codeine Nausea And Vomiting  . Hydrocodone Nausea Only    Social History  Substance Use Topics  . Smoking status: Former Smoker    Packs/day: 3.00    Years: 30.00    Types: Cigarettes    Quit date: 01/30/2005  . Smokeless tobacco: Not on file  . Alcohol use No    Family History  Problem Relation Age of Onset  . Cancer Mother   .  Hyperlipidemia Mother   . Hypertension Mother   . Cancer Father   . Heart disease Father 42       Heart Disease before age 65  . Heart attack Father   . Diabetes Sister   . Cancer Sister   . Hyperlipidemia Sister   . Hypertension Sister   . Heart attack Mother   . Rheumatologic disease Mother   . Colon cancer Neg Hx      Review of Systems  Constitutional: Negative.   HENT:  Negative.   Eyes: Negative.   Respiratory: Negative.   Cardiovascular: Negative.   Gastrointestinal: Positive for heartburn.  Genitourinary: Negative.   Musculoskeletal: Positive for joint pain.  Skin: Negative.   Neurological: Negative.   Endo/Heme/Allergies: Negative.   Psychiatric/Behavioral: Negative.     Objective:  Physical Exam  Vitals reviewed. Constitutional: He is oriented to person, place, and time. He appears well-developed and well-nourished.  HENT:  Head: Normocephalic and atraumatic.  Eyes: Pupils are equal, round, and reactive to light. Conjunctivae and EOM are normal.  Neck: Normal range of motion. Neck supple.  Cardiovascular: Normal rate, regular rhythm and intact distal pulses.   Respiratory: Effort normal. No respiratory distress.  GI: Soft. He exhibits no distension.  Genitourinary:  Genitourinary Comments: deferred  Musculoskeletal:       Right hip: He exhibits decreased range of motion, decreased strength, swelling and crepitus.  Neurological: He is alert and oriented to person, place, and time. He has normal reflexes.  Skin: Skin is warm and dry.  Psychiatric: He has a normal mood and affect. His behavior is normal. Judgment and thought content normal.    Vital signs in last 24 hours: @VSRANGES @  Labs:   Estimated body mass index is 28.59 kg/m as calculated from the following:   Height as of 03/22/15: 5\' 8"  (1.727 m).   Weight as of 03/22/15: 85.3 kg (188 lb).   Imaging Review Plain radiographs demonstrate severe degenerative joint disease of the right hip(s). The bone quality appears to be adequate for age and reported activity level.  Assessment/Plan:  End stage arthritis, right hip(s)  The patient history, physical examination, clinical judgement of the provider and imaging studies are consistent with end stage degenerative joint disease of the right hip(s) and total hip arthroplasty is deemed medically necessary. The treatment options  including medical management, injection therapy, arthroscopy and arthroplasty were discussed at length. The risks and benefits of total hip arthroplasty were presented and reviewed. The risks due to aseptic loosening, infection, stiffness, dislocation/subluxation,  thromboembolic complications and other imponderables were discussed.  The patient acknowledged the explanation, agreed to proceed with the plan and consent was signed. Patient is being admitted for inpatient treatment for surgery, pain control, PT, OT, prophylactic antibiotics, VTE prophylaxis, progressive ambulation and ADL's and discharge planning.The patient is planning to be discharged home with HEP

## 2016-09-09 ENCOUNTER — Encounter (HOSPITAL_COMMUNITY): Payer: Self-pay

## 2016-09-09 NOTE — Pre-Procedure Instructions (Signed)
Cory Roberts  09/09/2016      [id number] - [place], [place] - [address] [place] [id number] Phone: [phone] Fax: [phone]  [facility] [place],  - [address]  [place] [id number] Phone: [phone] Fax: [phone]    Your procedure is scheduled on September 18, 2016.  [facility] Admitting at 9:45 A.M.  Call this number if you have problems the morning of surgery:  [phone]   Remember:  Do not eat food or drink liquids after midnight.  Take these medicines the morning of surgery with A SIP OF WATER: Pantoprazole (Protonix), Tramadol (Ultram) if needed.  STOP taking your Aspirin as directed by your Doctor  7 days prior to surgery STOP taking any Aleve, Naproxen, Ibuprofen, Motrin, Advil, Goody's, BC's, all herbal medications, fish oil, and all vitamins.   Do not wear jewelry, make-up or nail polish.  Do not wear lotions, powders, or perfumes, or deoderant.  Do not shave 48 hours prior to surgery.  Men may shave face and neck.  Do not bring valuables to the hospital.   [facility] is not responsible for any belongings or valuables.  Contacts, dentures or bridgework may not be worn into surgery.  Leave your suitcase in the car.  After surgery it may be brought to your room.  For patients admitted to the hospital, discharge time will be determined by your treatment team.  Patients discharged the day of surgery will not be allowed to drive home.   Special instructions:   [facility]- Preparing For Surgery  Before surgery, you can play an important role. Because skin is not sterile, your skin needs to be as free of germs as possible. You can reduce the number of germs on your skin by washing with CHG (chlorahexidine gluconate) Soap before surgery.  CHG is an antiseptic cleaner which kills germs and bonds with the skin to continue killing germs even after washing.  Please do not use if  you have an allergy to CHG or antibacterial soaps. If your skin becomes reddened/irritated stop using the CHG.  Do not shave (including legs and underarms) for at least 48 hours prior to first CHG shower. It is OK to shave your face.  Please follow these instructions carefully.   1. Shower the NIGHT BEFORE SURGERY and the MORNING OF SURGERY with CHG.   2. If you chose to wash your hair, wash your hair first as usual with your normal shampoo.  3. After you shampoo, rinse your hair and body thoroughly to remove the shampoo.  4. Use CHG as you would any other liquid soap. You can apply CHG directly to the skin and wash gently with a scrungie or a clean washcloth.   5. Apply the CHG Soap to your body ONLY FROM THE NECK DOWN.  Do not use on open wounds or open sores. Avoid contact with your eyes, ears, mouth and genitals (private parts). Wash genitals (private parts) with your normal soap.  6. Wash thoroughly, paying special attention to the area where your surgery will be performed.  7. Thoroughly rinse your body with warm water from the neck down.  8. DO NOT shower/wash with your normal soap after using and rinsing off the CHG Soap.  9. Pat yourself dry with a CLEAN TOWEL.   10. Wear CLEAN PAJAMAS   11. Place CLEAN SHEETS on your bed the night of your first shower and DO NOT  SLEEP WITH PETS.    Day of Surgery: Do not apply any deodorants/lotions. Please wear clean clothes to the hospital/surgery center.      Please read over the following fact sheets that you were given. Pain Booklet, Coughing and Deep Breathing, MRSA Information and Surgical Site Infection Prevention

## 2016-09-11 ENCOUNTER — Encounter (HOSPITAL_COMMUNITY)
Admission: RE | Admit: 2016-09-11 | Discharge: 2016-09-11 | Disposition: A | Discharge status: Home / Self Care | Payer: BLUE CROSS/BLUE SHIELD | Source: Ambulatory Visit | Attending: Orthopedic Surgery | Admitting: Orthopedic Surgery

## 2016-09-11 ENCOUNTER — Encounter (HOSPITAL_COMMUNITY): Payer: Self-pay

## 2016-09-11 DIAGNOSIS — Z01818 Encounter for other preprocedural examination: Secondary | ICD-10-CM | POA: Diagnosis not present

## 2016-09-11 DIAGNOSIS — Z01812 Encounter for preprocedural laboratory examination: Secondary | ICD-10-CM | POA: Diagnosis not present

## 2016-09-11 DIAGNOSIS — Z0183 Encounter for blood typing: Secondary | ICD-10-CM | POA: Insufficient documentation

## 2016-09-11 DIAGNOSIS — M879 Osteonecrosis, unspecified: Secondary | ICD-10-CM | POA: Insufficient documentation

## 2016-09-11 HISTORY — DX: Cerebral infarction, unspecified: I63.9

## 2016-09-11 LAB — BASIC METABOLIC PANEL
Anion gap: 7 (ref 5–15)
BUN: 17 mg/dL (ref 6–20)
CO2: 29 mmol/L (ref 22–32)
Calcium: 9.2 mg/dL (ref 8.9–10.3)
Chloride: 102 mmol/L (ref 101–111)
Creatinine, Ser: 1.11 mg/dL (ref 0.61–1.24)
Glucose, Bld: 105 mg/dL — ABNORMAL HIGH (ref 65–99)
POTASSIUM: 3.6 mmol/L (ref 3.5–5.1)
SODIUM: 138 mmol/L (ref 135–145)

## 2016-09-11 LAB — CBC
HCT: 39.7 % (ref 39.0–52.0)
HEMOGLOBIN: 13.4 g/dL (ref 13.0–17.0)
MCH: 32.1 pg (ref 26.0–34.0)
MCHC: 33.8 g/dL (ref 30.0–36.0)
MCV: 95 fL (ref 78.0–100.0)
Platelets: 217 10*3/uL (ref 150–400)
RBC: 4.18 MIL/uL — AB (ref 4.22–5.81)
RDW: 13.5 % (ref 11.5–15.5)
WBC: 5.1 10*3/uL (ref 4.0–10.5)

## 2016-09-11 LAB — ABO/RH: ABO/RH(D): A POS

## 2016-09-11 LAB — TYPE AND SCREEN
ABO/RH(D): A POS
Antibody Screen: NEGATIVE

## 2016-09-11 LAB — SURGICAL PCR SCREEN
MRSA, PCR: NEGATIVE
Staphylococcus aureus: NEGATIVE

## 2016-09-11 NOTE — Progress Notes (Signed)
PCP: Nolene BernheimSamatha Jackson, PA @ [facility] [place], [place]

## 2016-09-15 MED ORDER — TRANEXAMIC ACID 1000 MG/10ML IV SOLN
1000.0000 mg | INTRAVENOUS | Status: AC
  Administered 2016-09-18: 1000 mg via INTRAVENOUS
  Filled 2016-09-15: qty 1100

## 2016-09-15 NOTE — Progress Notes (Signed)
[facility] called about last office visit and EKG tracing states they just faxed it over.

## 2016-09-18 ENCOUNTER — Inpatient Hospital Stay (HOSPITAL_COMMUNITY): Payer: BLUE CROSS/BLUE SHIELD

## 2016-09-18 ENCOUNTER — Inpatient Hospital Stay (HOSPITAL_COMMUNITY): Payer: BLUE CROSS/BLUE SHIELD | Admitting: Certified Registered Nurse Anesthetist

## 2016-09-18 ENCOUNTER — Inpatient Hospital Stay (HOSPITAL_COMMUNITY)
Admission: RE | Admit: 2016-09-18 | Discharge: 2016-09-19 | DRG: 470 | Disposition: A | Discharge status: Home / Self Care | Payer: BLUE CROSS/BLUE SHIELD | Source: Ambulatory Visit | Attending: Orthopedic Surgery | Admitting: Orthopedic Surgery

## 2016-09-18 ENCOUNTER — Encounter (HOSPITAL_COMMUNITY): Payer: Self-pay

## 2016-09-18 ENCOUNTER — Encounter (HOSPITAL_COMMUNITY)
Admission: RE | Disposition: A | Discharge status: Home / Self Care | Payer: Self-pay | Source: Ambulatory Visit | Attending: Orthopedic Surgery

## 2016-09-18 DIAGNOSIS — Z8673 Personal history of transient ischemic attack (TIA), and cerebral infarction without residual deficits: Secondary | ICD-10-CM | POA: Diagnosis not present

## 2016-09-18 DIAGNOSIS — M109 Gout, unspecified: Secondary | ICD-10-CM | POA: Diagnosis present

## 2016-09-18 DIAGNOSIS — M1611 Unilateral primary osteoarthritis, right hip: Secondary | ICD-10-CM | POA: Diagnosis not present

## 2016-09-18 DIAGNOSIS — Z09 Encounter for follow-up examination after completed treatment for conditions other than malignant neoplasm: Secondary | ICD-10-CM

## 2016-09-18 DIAGNOSIS — K210 Gastro-esophageal reflux disease with esophagitis: Secondary | ICD-10-CM | POA: Diagnosis present

## 2016-09-18 DIAGNOSIS — Z471 Aftercare following joint replacement surgery: Secondary | ICD-10-CM | POA: Diagnosis not present

## 2016-09-18 DIAGNOSIS — Z885 Allergy status to narcotic agent status: Secondary | ICD-10-CM | POA: Diagnosis not present

## 2016-09-18 DIAGNOSIS — M87051 Idiopathic aseptic necrosis of right femur: Secondary | ICD-10-CM | POA: Diagnosis not present

## 2016-09-18 DIAGNOSIS — Z833 Family history of diabetes mellitus: Secondary | ICD-10-CM | POA: Diagnosis not present

## 2016-09-18 DIAGNOSIS — Z419 Encounter for procedure for purposes other than remedying health state, unspecified: Secondary | ICD-10-CM

## 2016-09-18 DIAGNOSIS — Z8249 Family history of ischemic heart disease and other diseases of the circulatory system: Secondary | ICD-10-CM

## 2016-09-18 DIAGNOSIS — Z96641 Presence of right artificial hip joint: Secondary | ICD-10-CM | POA: Diagnosis not present

## 2016-09-18 DIAGNOSIS — I639 Cerebral infarction, unspecified: Secondary | ICD-10-CM | POA: Diagnosis not present

## 2016-09-18 DIAGNOSIS — I7300 Raynaud's syndrome without gangrene: Secondary | ICD-10-CM | POA: Diagnosis not present

## 2016-09-18 DIAGNOSIS — M25551 Pain in right hip: Secondary | ICD-10-CM | POA: Diagnosis not present

## 2016-09-18 DIAGNOSIS — Z8349 Family history of other endocrine, nutritional and metabolic diseases: Secondary | ICD-10-CM

## 2016-09-18 DIAGNOSIS — Z87891 Personal history of nicotine dependence: Secondary | ICD-10-CM

## 2016-09-18 DIAGNOSIS — R1310 Dysphagia, unspecified: Secondary | ICD-10-CM | POA: Diagnosis not present

## 2016-09-18 HISTORY — PX: TOTAL HIP ARTHROPLASTY: SHX124

## 2016-09-18 SURGERY — ARTHROPLASTY, HIP, TOTAL, ANTERIOR APPROACH
Anesthesia: Spinal | Site: Hip | Laterality: Right

## 2016-09-18 MED ORDER — DOCUSATE SODIUM 100 MG PO CAPS
100.0000 mg | ORAL_CAPSULE | Freq: Two times a day (BID) | ORAL | Status: DC
  Administered 2016-09-18 – 2016-09-19 (×2): 100 mg via ORAL
  Filled 2016-09-18 (×2): qty 1

## 2016-09-18 MED ORDER — MIDAZOLAM HCL 5 MG/5ML IJ SOLN
INTRAMUSCULAR | Status: DC | PRN
  Administered 2016-09-18: 2 mg via INTRAVENOUS

## 2016-09-18 MED ORDER — ACETAMINOPHEN 10 MG/ML IV SOLN
1000.0000 mg | INTRAVENOUS | Status: AC
  Administered 2016-09-18 (×2): 1000 mg via INTRAVENOUS

## 2016-09-18 MED ORDER — FENTANYL CITRATE (PF) 250 MCG/5ML IJ SOLN
INTRAMUSCULAR | Status: AC
  Filled 2016-09-18: qty 5

## 2016-09-18 MED ORDER — SODIUM CHLORIDE 0.9 % IR SOLN
Status: DC | PRN
  Administered 2016-09-18: 1000 mL
  Administered 2016-09-18: 3000 mL

## 2016-09-18 MED ORDER — LACTATED RINGERS IV SOLN
INTRAVENOUS | Status: DC
  Administered 2016-09-18: 10:00:00 via INTRAVENOUS

## 2016-09-18 MED ORDER — PROPOFOL 1000 MG/100ML IV EMUL
INTRAVENOUS | Status: AC
  Filled 2016-09-18: qty 100

## 2016-09-18 MED ORDER — POLYETHYLENE GLYCOL 3350 17 G PO PACK: 17.0000 g | PACK | Freq: Every day | ORAL | Status: DC | PRN

## 2016-09-18 MED ORDER — CEFAZOLIN SODIUM-DEXTROSE 2-4 GM/100ML-% IV SOLN
INTRAVENOUS | Status: AC
  Filled 2016-09-18: qty 100

## 2016-09-18 MED ORDER — ONDANSETRON HCL 4 MG PO TABS: 4.0000 mg | ORAL_TABLET | Freq: Four times a day (QID) | ORAL | Status: DC | PRN

## 2016-09-18 MED ORDER — PHENOL 1.4 % MT LIQD: 1.0000 | OROMUCOSAL | Status: DC | PRN

## 2016-09-18 MED ORDER — KETOROLAC TROMETHAMINE 30 MG/ML IJ SOLN
INTRAMUSCULAR | Status: DC | PRN
  Administered 2016-09-18: 30 mg via INTRA_ARTICULAR

## 2016-09-18 MED ORDER — DIPHENHYDRAMINE HCL 12.5 MG/5ML PO ELIX: 12.5000 mg | ORAL_SOLUTION | ORAL | Status: DC | PRN

## 2016-09-18 MED ORDER — METHOCARBAMOL 1000 MG/10ML IJ SOLN
500.0000 mg | Freq: Four times a day (QID) | INTRAMUSCULAR | Status: DC | PRN
  Filled 2016-09-18: qty 5

## 2016-09-18 MED ORDER — SODIUM CHLORIDE 0.9 % IV SOLN
INTRAVENOUS | Status: DC
  Administered 2016-09-18: 17:00:00 via INTRAVENOUS

## 2016-09-18 MED ORDER — CHLORHEXIDINE GLUCONATE 4 % EX LIQD: 60.0000 mL | Freq: Once | CUTANEOUS | Status: DC

## 2016-09-18 MED ORDER — HYDROMORPHONE HCL 1 MG/ML IJ SOLN
0.5000 mg | INTRAMUSCULAR | Status: DC | PRN
  Administered 2016-09-18: 2 mg via INTRAVENOUS
  Filled 2016-09-18: qty 2

## 2016-09-18 MED ORDER — KETOROLAC TROMETHAMINE 15 MG/ML IJ SOLN
15.0000 mg | Freq: Four times a day (QID) | INTRAMUSCULAR | Status: DC
  Administered 2016-09-18 – 2016-09-19 (×3): 15 mg via INTRAVENOUS
  Filled 2016-09-18 (×4): qty 1

## 2016-09-18 MED ORDER — METHOCARBAMOL 500 MG PO TABS
500.0000 mg | ORAL_TABLET | Freq: Four times a day (QID) | ORAL | Status: DC | PRN
  Administered 2016-09-18: 500 mg via ORAL
  Filled 2016-09-18: qty 1

## 2016-09-18 MED ORDER — SODIUM CHLORIDE 0.9 % IJ SOLN
INTRAMUSCULAR | Status: DC | PRN
  Administered 2016-09-18 (×3): 10 mL

## 2016-09-18 MED ORDER — ASPIRIN 81 MG PO CHEW
81.0000 mg | CHEWABLE_TABLET | Freq: Two times a day (BID) | ORAL | Status: DC
  Administered 2016-09-19: 81 mg via ORAL
  Filled 2016-09-18: qty 1

## 2016-09-18 MED ORDER — DEXAMETHASONE SODIUM PHOSPHATE 10 MG/ML IJ SOLN
10.0000 mg | Freq: Once | INTRAMUSCULAR | Status: AC
  Administered 2016-09-19: 10 mg via INTRAVENOUS
  Filled 2016-09-18: qty 1

## 2016-09-18 MED ORDER — SENNA 8.6 MG PO TABS: 2.0000 | ORAL_TABLET | Freq: Every day | ORAL | Status: DC

## 2016-09-18 MED ORDER — FENTANYL CITRATE (PF) 100 MCG/2ML IJ SOLN
INTRAMUSCULAR | Status: DC | PRN
  Administered 2016-09-18 (×2): 50 ug via INTRAVENOUS

## 2016-09-18 MED ORDER — CEFAZOLIN SODIUM-DEXTROSE 2-4 GM/100ML-% IV SOLN
2.0000 g | INTRAVENOUS | Status: AC
  Administered 2016-09-18: 2 g via INTRAVENOUS

## 2016-09-18 MED ORDER — MIDAZOLAM HCL 2 MG/2ML IJ SOLN
INTRAMUSCULAR | Status: AC
  Filled 2016-09-18: qty 2

## 2016-09-18 MED ORDER — KETOROLAC TROMETHAMINE 30 MG/ML IJ SOLN
INTRAMUSCULAR | Status: AC
  Filled 2016-09-18: qty 1

## 2016-09-18 MED ORDER — PROPOFOL 500 MG/50ML IV EMUL
INTRAVENOUS | Status: DC | PRN
  Administered 2016-09-18: 75 ug/kg/min via INTRAVENOUS
  Administered 2016-09-18: 14:00:00 via INTRAVENOUS

## 2016-09-18 MED ORDER — MENTHOL 3 MG MT LOZG: 1.0000 | LOZENGE | OROMUCOSAL | Status: DC | PRN

## 2016-09-18 MED ORDER — POVIDONE-IODINE 10 % EX SWAB: 2.0000 "application " | Freq: Once | CUTANEOUS | Status: DC

## 2016-09-18 MED ORDER — TRANEXAMIC ACID 1000 MG/10ML IV SOLN
1000.0000 mg | Freq: Once | INTRAVENOUS | Status: AC
  Administered 2016-09-18: 1000 mg via INTRAVENOUS
  Filled 2016-09-18 (×2): qty 10

## 2016-09-18 MED ORDER — METOCLOPRAMIDE HCL 5 MG PO TABS: 5.0000 mg | ORAL_TABLET | Freq: Three times a day (TID) | ORAL | Status: DC | PRN

## 2016-09-18 MED ORDER — VANCOMYCIN HCL IN DEXTROSE 1-5 GM/200ML-% IV SOLN
1000.0000 mg | Freq: Two times a day (BID) | INTRAVENOUS | Status: AC
  Administered 2016-09-18: 1000 mg via INTRAVENOUS
  Filled 2016-09-18: qty 200

## 2016-09-18 MED ORDER — HYDROMORPHONE HCL 1 MG/ML IJ SOLN: 0.2500 mg | INTRAMUSCULAR | Status: DC | PRN

## 2016-09-18 MED ORDER — MEPERIDINE HCL 25 MG/ML IJ SOLN: 6.2500 mg | INTRAMUSCULAR | Status: DC | PRN

## 2016-09-18 MED ORDER — BUPIVACAINE IN DEXTROSE 0.75-8.25 % IT SOLN
INTRATHECAL | Status: DC | PRN
  Administered 2016-09-18: 15 mg via INTRATHECAL

## 2016-09-18 MED ORDER — SODIUM CHLORIDE 0.9 % IV SOLN: INTRAVENOUS | Status: DC

## 2016-09-18 MED ORDER — EPHEDRINE 5 MG/ML INJ
INTRAVENOUS | Status: AC
  Filled 2016-09-18: qty 10

## 2016-09-18 MED ORDER — BUPIVACAINE-EPINEPHRINE (PF) 0.5% -1:200000 IJ SOLN
INTRAMUSCULAR | Status: AC
  Filled 2016-09-18: qty 30

## 2016-09-18 MED ORDER — ACETAMINOPHEN 325 MG PO TABS
650.0000 mg | ORAL_TABLET | Freq: Four times a day (QID) | ORAL | Status: DC | PRN
  Administered 2016-09-18: 650 mg via ORAL
  Filled 2016-09-18: qty 2

## 2016-09-18 MED ORDER — PHENYLEPHRINE HCL 10 MG/ML IJ SOLN
INTRAVENOUS | Status: DC | PRN
  Administered 2016-09-18: 25 ug/min via INTRAVENOUS

## 2016-09-18 MED ORDER — PANTOPRAZOLE SODIUM 40 MG PO TBEC
40.0000 mg | DELAYED_RELEASE_TABLET | Freq: Every day | ORAL | Status: DC
  Administered 2016-09-19: 40 mg via ORAL
  Filled 2016-09-18: qty 1

## 2016-09-18 MED ORDER — ACETAMINOPHEN 10 MG/ML IV SOLN
INTRAVENOUS | Status: AC
  Filled 2016-09-18: qty 100

## 2016-09-18 MED ORDER — MIDAZOLAM HCL 2 MG/2ML IJ SOLN: 0.5000 mg | Freq: Once | INTRAMUSCULAR | Status: DC | PRN

## 2016-09-18 MED ORDER — ONDANSETRON HCL 4 MG/2ML IJ SOLN: 4.0000 mg | Freq: Four times a day (QID) | INTRAMUSCULAR | Status: DC | PRN

## 2016-09-18 MED ORDER — 0.9 % SODIUM CHLORIDE (POUR BTL) OPTIME
TOPICAL | Status: DC | PRN
  Administered 2016-09-18: 1000 mL

## 2016-09-18 MED ORDER — EPHEDRINE SULFATE-NACL 50-0.9 MG/10ML-% IV SOSY
PREFILLED_SYRINGE | INTRAVENOUS | Status: DC | PRN
  Administered 2016-09-18: 5 mg via INTRAVENOUS
  Administered 2016-09-18 (×3): 10 mg via INTRAVENOUS

## 2016-09-18 MED ORDER — TRIAMCINOLONE ACETONIDE 40 MG/ML IJ SUSP
INTRAMUSCULAR | Status: AC
  Filled 2016-09-18: qty 5

## 2016-09-18 MED ORDER — OXYCODONE HCL 5 MG PO TABS
5.0000 mg | ORAL_TABLET | ORAL | Status: DC | PRN
  Filled 2016-09-18: qty 2

## 2016-09-18 MED ORDER — LACTATED RINGERS IV SOLN
INTRAVENOUS | Status: DC | PRN
  Administered 2016-09-18 (×2): via INTRAVENOUS

## 2016-09-18 MED ORDER — METOCLOPRAMIDE HCL 5 MG/ML IJ SOLN: 5.0000 mg | Freq: Three times a day (TID) | INTRAMUSCULAR | Status: DC | PRN

## 2016-09-18 MED ORDER — PROMETHAZINE HCL 25 MG/ML IJ SOLN: 6.2500 mg | INTRAMUSCULAR | Status: DC | PRN

## 2016-09-18 MED ORDER — ACETAMINOPHEN 650 MG RE SUPP: 650.0000 mg | Freq: Four times a day (QID) | RECTAL | Status: DC | PRN

## 2016-09-18 MED ORDER — BUPIVACAINE-EPINEPHRINE (PF) 0.5% -1:200000 IJ SOLN
INTRAMUSCULAR | Status: DC | PRN
  Administered 2016-09-18: 30 mL

## 2016-09-18 SURGICAL SUPPLY — 57 items
ALCOHOL ISOPROPYL (RUBBING) (MISCELLANEOUS) ×2 IMPLANT
BIT DRILL RINGLOC 3.2MMX20 (BIT) IMPLANT
BIT DRILL RINGLOC 3.2X20 (BIT)
BIT DRILL RINGLOC QUICK CONN (BIT) IMPLANT
BLADE CLIPPER SURG (BLADE) ×2 IMPLANT
CAPT HIP TOTAL 2 ×2 IMPLANT
CHLORAPREP W/TINT 26ML (MISCELLANEOUS) ×2 IMPLANT
COVER SURGICAL LIGHT HANDLE (MISCELLANEOUS) ×2 IMPLANT
DERMABOND ADVANCED (GAUZE/BANDAGES/DRESSINGS) ×1
DERMABOND ADVANCED .7 DNX12 (GAUZE/BANDAGES/DRESSINGS) ×1 IMPLANT
DRAPE C-ARM 42X72 X-RAY (DRAPES) ×2 IMPLANT
DRAPE STERI IOBAN 125X83 (DRAPES) ×2 IMPLANT
DRAPE U-SHAPE 47X51 STRL (DRAPES) ×6 IMPLANT
DRILL BIT RINGLOC 3.2MMX20 (BIT)
DRILL BIT RINGLOC QUICK CONN (BIT)
DRSG AQUACEL AG ADV 3.5X10 (GAUZE/BANDAGES/DRESSINGS) ×2 IMPLANT
ELECT BLADE 4.0 EZ CLEAN MEGAD (MISCELLANEOUS) ×2
ELECT PENCIL ROCKER SW 15FT (MISCELLANEOUS) ×2 IMPLANT
ELECT REM PT RETURN 9FT ADLT (ELECTROSURGICAL) ×2
ELECTRODE BLDE 4.0 EZ CLN MEGD (MISCELLANEOUS) ×1 IMPLANT
ELECTRODE REM PT RTRN 9FT ADLT (ELECTROSURGICAL) ×1 IMPLANT
EVACUATOR 1/8 PVC DRAIN (DRAIN) IMPLANT
GLOVE BIO SURGEON STRL SZ8.5 (GLOVE) ×12 IMPLANT
GLOVE BIOGEL PI IND STRL 8.5 (GLOVE) ×1 IMPLANT
GLOVE BIOGEL PI INDICATOR 8.5 (GLOVE) ×1
GOWN STRL REUS W/ TWL LRG LVL3 (GOWN DISPOSABLE) ×2 IMPLANT
GOWN STRL REUS W/TWL 2XL LVL3 (GOWN DISPOSABLE) ×4 IMPLANT
GOWN STRL REUS W/TWL LRG LVL3 (GOWN DISPOSABLE) ×2
HANDPIECE INTERPULSE COAX TIP (DISPOSABLE) ×1
HOOD PEEL AWAY FACE SHEILD DIS (HOOD) ×4 IMPLANT
KIT BASIN OR (CUSTOM PROCEDURE TRAY) ×2 IMPLANT
KIT ROOM TURNOVER OR (KITS) ×2 IMPLANT
MANIFOLD NEPTUNE II (INSTRUMENTS) ×2 IMPLANT
MARKER SKIN DUAL TIP RULER LAB (MISCELLANEOUS) ×4 IMPLANT
NEEDLE SPNL 18GX3.5 QUINCKE PK (NEEDLE) ×2 IMPLANT
NS IRRIG 1000ML POUR BTL (IV SOLUTION) ×2 IMPLANT
PACK TOTAL JOINT (CUSTOM PROCEDURE TRAY) ×2 IMPLANT
PACK UNIVERSAL I (CUSTOM PROCEDURE TRAY) ×2 IMPLANT
PAD ARMBOARD 7.5X6 YLW CONV (MISCELLANEOUS) ×4 IMPLANT
SAW OSC TIP CART 19.5X105X1.3 (SAW) ×2 IMPLANT
SEALER BIPOLAR AQUA 6.0 (INSTRUMENTS) ×2 IMPLANT
SET HNDPC FAN SPRY TIP SCT (DISPOSABLE) ×1 IMPLANT
SOL PREP POV-IOD 4OZ 10% (MISCELLANEOUS) ×2 IMPLANT
SUT ETHIBOND NAB CT1 #1 30IN (SUTURE) ×4 IMPLANT
SUT MNCRL AB 3-0 PS2 18 (SUTURE) ×2 IMPLANT
SUT MON AB 2-0 CT1 36 (SUTURE) ×2 IMPLANT
SUT VIC AB 1 CT1 27 (SUTURE) ×1
SUT VIC AB 1 CT1 27XBRD ANBCTR (SUTURE) ×1 IMPLANT
SUT VIC AB 2-0 CT1 27 (SUTURE) ×1
SUT VIC AB 2-0 CT1 TAPERPNT 27 (SUTURE) ×1 IMPLANT
SUT VLOC 180 0 24IN GS25 (SUTURE) ×2 IMPLANT
SYR 50ML LL SCALE MARK (SYRINGE) ×2 IMPLANT
TOWEL OR 17X24 6PK STRL BLUE (TOWEL DISPOSABLE) ×2 IMPLANT
TOWEL OR 17X26 10 PK STRL BLUE (TOWEL DISPOSABLE) ×2 IMPLANT
TRAY CATH 16FR W/PLASTIC CATH (SET/KITS/TRAYS/PACK) ×2 IMPLANT
TRAY FOLEY CATH SILVER 16FR (SET/KITS/TRAYS/PACK) IMPLANT
WATER STERILE IRR 1000ML POUR (IV SOLUTION) ×6 IMPLANT

## 2016-09-18 NOTE — Evaluation (Signed)
Physical Therapy Evaluation Patient Details Name: Cory Roberts MRN: [id number] DOB: [birth year] Today's Date: 09/18/2016   History of Present Illness  Pt is a 56 y/o male s/p elective R THA. PMH includes CVA, gout, raynaud's syndrome, and vertigo.   Clinical Impression  Pt is s/p surgery above with deficits below. PTA, pt was independent with functional mobility, however, could only tolerate short distances secondary to pain. Upon eval, pt limited by post op pain and weakness, as well as, slightly decreased balance. Pt requiring min guard to min A during mobility tasks. Pt reports wife will be available intermittently upon return home. Has all necessary DME, and follow up recommendations per MD arrangements. Will continue to follow acutely to maximize functional mobility independence and safety.     Follow Up Recommendations DC plan and follow up therapy as arranged by surgeon;Supervision for mobility/OOB    Equipment Recommendations  None recommended by PT    Recommendations for Other Services       Precautions / Restrictions Precautions Precautions: None Precaution Comments: Reviewed THA exercise handout with pt.  Restrictions Weight Bearing Restrictions: Yes RLE Weight Bearing: Weight bearing as tolerated LLE Weight Bearing: Weight bearing as tolerated      Mobility  Bed Mobility Overal bed mobility: Needs Assistance Bed Mobility: Supine to Sit     Supine to sit: Min guard     General bed mobility comments: Min guard for safety. Use of bed rails.   Transfers Overall transfer level: Needs assistance Equipment used: Rolling walker (2 wheeled) Transfers: Sit to/from Stand Sit to Stand: Min assist         General transfer comment: Min A for steadying. Verbal cues for safe hand placement.   Ambulation/Gait Ambulation/Gait assistance: Min guard Ambulation Distance (Feet): 50 Feet Assistive device: Rolling walker (2 wheeled) Gait Pattern/deviations: Step-to  pattern;Step-through pattern;Decreased step length - right;Decreased step length - left;Decreased weight shift to right;Antalgic Gait velocity: Decreased Gait velocity interpretation: Below normal speed for age/gender General Gait Details: Slow, antalgic gait secondary to post op pain and weakness. Verbal cues for sequencing with RW. Initially using step to pattern, however, able to progress to step through pattern with cues.   Stairs            Wheelchair Mobility    Modified Rankin (Stroke Patients Only)       Balance Overall balance assessment: Needs assistance Sitting-balance support: No upper extremity supported;Feet supported Sitting balance-[name]: Good     Standing balance support: Bilateral upper extremity supported;During functional activity Standing balance-[name]: Poor Standing balance comment: Reliant on RW for stability                              Pertinent Vitals/Pain Pain Assessment: 0-10 Pain Score: 3  Pain Location: L hip  Pain Descriptors / Indicators: Aching;Operative site guarding (pulling ) Pain Intervention(s): Limited activity within patient's tolerance;Monitored during session;Repositioned    Home Living Family/patient expects to be discharged to:: Private residence Living Arrangements: Spouse/significant other Available Help at Discharge: Family;Available PRN/intermittently Type of Home: Mobile home Home Access: Stairs to enter Entrance Stairs-Rails: None Entrance Stairs-Number of Steps: 2 Home Layout: One level Home Equipment: Walker - 2 wheels;Bedside commode;Shower seat;Cane - single point      Prior Function Level of Independence: Independent         Comments: But could not ambulate long distances.      Hand Dominance   Dominant Hand:  Left    Extremity/Trunk Assessment   Upper Extremity Assessment Upper Extremity Assessment: Overall WFL for tasks assessed    Lower Extremity Assessment Lower  Extremity Assessment: LLE deficits/detail LLE Deficits / Details: Deficits consistent with post op pain and weakness. Able to perform exercises below. Reports decreased sensation from hip down.     Cervical / Trunk Assessment Cervical / Trunk Assessment: Normal  Communication   Communication: No difficulties  Cognition Arousal/Alertness: Awake/alert Behavior During Therapy: WFL for tasks assessed/performed Overall Cognitive Status: Within Functional Limits for tasks assessed                                        General Comments General comments (skin integrity, edema, etc.): Pt's wife present during session.     Exercises Total Joint Exercises Ankle Circles/Pumps: AROM;Both;20 reps;Supine Quad Sets: AROM;Right;10 reps;Supine Short Arc Quad: AROM;Right;10 reps;Supine Heel Slides: AROM;Right;10 reps;Supine Hip ABduction/ADduction: AROM;Right;10 reps;Supine   Assessment/Plan    PT Assessment Patient needs continued PT services  PT Problem List Decreased strength;Decreased range of motion;Decreased balance;Decreased knowledge of use of DME;Decreased knowledge of precautions;Pain;Impaired sensation       PT Treatment Interventions DME instruction;Gait training;Stair training;Functional mobility training;Therapeutic activities;Therapeutic exercise;Balance training;Neuromuscular re-education;Patient/family education    PT Goals (Current goals can be found in the Care Plan section)  Acute Rehab PT Goals Patient Stated Goal: to go home  PT Goal Formulation: With patient Time For Goal Achievement: 09/25/16 Potential to Achieve Goals: Good    Frequency 7X/week   Barriers to discharge        Co-evaluation               AM-PAC PT "6 Clicks" Daily Activity  Outcome Measure Difficulty turning over in bed (including adjusting bedclothes, sheets and blankets)?: A Little Difficulty moving from lying on back to sitting on the side of the bed? : A  Little Difficulty sitting down on and standing up from a chair with arms (e.g., wheelchair, bedside commode, etc,.)?: Unable Help needed moving to and from a bed to chair (including a wheelchair)?: A Little Help needed walking in hospital room?: A Little Help needed climbing 3-5 steps with a railing? : A Lot 6 Click Score: 15    End of Session Equipment Utilized During Treatment: Gait belt Activity Tolerance: Patient tolerated treatment well Patient left: in chair;with call bell/phone within reach;with family/visitor present Nurse Communication: Mobility status PT Visit Diagnosis: Other abnormalities of gait and mobility (R26.89);Pain Pain - Right/Left: Right Pain - part of body: Hip    Time: 1722-1746 PT Time Calculation (min) (ACUTE ONLY): 24 min   Charges:   PT Evaluation $PT Eval Low Complexity: 1 Low PT Treatments $Gait Training: 8-22 mins   PT G Codes:        Gladys Damme, PT, DPT  Acute Rehabilitation Services  Pager: [phone]   Lehman Prom 09/18/2016, 5:57 PM

## 2016-09-18 NOTE — Anesthesia Postprocedure Evaluation (Signed)
Anesthesia Post Note  Patient: Cory Roberts  Procedure(s) Performed: Procedure(s) (LRB): TOTAL HIP ARTHROPLASTY ANTERIOR APPROACH (Right)     Patient location during evaluation: PACU Anesthesia Type: Spinal Level of consciousness: oriented and awake and alert Pain management: pain level controlled Vital Signs Assessment: post-procedure vital signs reviewed and stable Respiratory status: spontaneous breathing and respiratory function stable Cardiovascular status: blood pressure returned to baseline and stable Postop Assessment: no headache and no backache Anesthetic complications: no    Last Vitals:  Vitals:   09/18/16 1405 09/18/16 1420  BP:  92/61  Pulse:    Resp:    Temp: 36.5 C   SpO2:      Last Pain:  Vitals:   09/18/16 1002  TempSrc: Oral  PainSc: 10-Worst pain ever                 Lowella Curb

## 2016-09-18 NOTE — Discharge Instructions (Signed)
°Dr. Aurianna Earlywine °Joint Replacement Specialist °[facility] °[address] °[place], [place] [address] °[phone] ° ° °TOTAL HIP REPLACEMENT POSTOPERATIVE DIRECTIONS ° ° ° °Hip Rehabilitation, Guidelines Following Surgery  ° °WEIGHT BEARING °Weight bearing as tolerated with assist device (walker, cane, etc) as directed, use it as long as suggested by your surgeon or therapist, typically at least 4-6 weeks. ° °The results of a hip operation are greatly improved after range of motion and muscle strengthening exercises. Follow all safety measures which are given to protect your hip. If any of these exercises cause increased pain or swelling in your joint, decrease the amount until you are comfortable again. Then slowly increase the exercises. Call your caregiver if you have problems or questions.  ° °HOME CARE INSTRUCTIONS  °Most of the following instructions are designed to prevent the dislocation of your new hip.  °Remove items at home which could result in a fall. This includes throw rugs or furniture in walking pathways.  °Continue medications as instructed at time of discharge. °· You may have some home medications which will be placed on hold until you complete the course of blood thinner medication. °· You may start showering once you are discharged home. Do not remove your dressing. °Do not put on socks or shoes without following the instructions of your caregivers.   °Sit on chairs with arms. Use the chair arms to help push yourself up when arising.  °Arrange for the use of a toilet seat elevator so you are not sitting low.  °· Walk with walker as instructed.  °You may resume a sexual relationship in one month or when given the OK by your caregiver.  °Use walker as long as suggested by your caregivers.  °You may put full weight on your legs and walk as much as is comfortable. °Avoid periods of inactivity such as sitting longer than an hour when not asleep. This helps prevent  blood clots.  °You may return to work once you are cleared by your surgeon.  °Do not drive a car for 6 weeks or until released by your surgeon.  °Do not drive while taking narcotics.  °Wear elastic stockings for two weeks following surgery during the day but you may remove then at night.  °Make sure you keep all of your appointments after your operation with all of your doctors and caregivers. You should call the office at the above phone number and make an appointment for approximately two weeks after the date of your surgery. °Please pick up a stool softener and laxative for home use as long as you are requiring pain medications. °· ICE to the affected hip every three hours for 30 minutes at a time and then as needed for pain and swelling. Continue to use ice on the hip for pain and swelling from surgery. You may notice swelling that will progress down to the foot and ankle.  This is normal after surgery.  Elevate the leg when you are not up walking on it.   °It is important for you to complete the blood thinner medication as prescribed by your doctor. °· Continue to use the breathing machine which will help keep your temperature down.  It is common for your temperature to cycle up and down following surgery, especially at night when you are not up moving around and exerting yourself.  The breathing machine keeps your lungs expanded and your temperature down. ° °RANGE OF MOTION AND STRENGTHENING EXERCISES  °These exercises are   designed to help you keep full movement of your hip joint. Follow your caregiver's or physical therapist's instructions. Perform all exercises about fifteen times, three times per day or as directed. Exercise both hips, even if you have had only one joint replacement. These exercises can be done on a training (exercise) mat, on the floor, on a table or on a bed. Use whatever works the best and is most comfortable for you. Use music or television while you are exercising so that the exercises  are a pleasant break in your day. This will make your life better with the exercises acting as a break in routine you can look forward to.  °Lying on your back, slowly slide your foot toward your buttocks, raising your knee up off the floor. Then slowly slide your foot back down until your leg is straight again.  °Lying on your back spread your legs as far apart as you can without causing discomfort.  °Lying on your side, raise your upper leg and foot straight up from the floor as far as is comfortable. Slowly lower the leg and repeat.  °Lying on your back, tighten up the muscle in the front of your thigh (quadriceps muscles). You can do this by keeping your leg straight and trying to raise your heel off the floor. This helps strengthen the largest muscle supporting your knee.  °Lying on your back, tighten up the muscles of your buttocks both with the legs straight and with the knee bent at a comfortable angle while keeping your heel on the floor.  ° °SKILLED REHAB INSTRUCTIONS: °If the patient is transferred to a skilled rehab facility following release from the hospital, a list of the current medications will be sent to the facility for the patient to continue.  When discharged from the skilled rehab facility, please have the facility set up the patient's Home Health Physical Therapy prior to being released. Also, the skilled facility will be responsible for providing the patient with their medications at time of release from the facility to include their pain medication and their blood thinner medication. If the patient is still at the rehab facility at time of the two week follow up appointment, the skilled rehab facility will also need to assist the patient in arranging follow up appointment in our office and any transportation needs. ° °MAKE SURE YOU:  °Understand these instructions.  °Will watch your condition.  °Will get help right away if you are not doing well or get worse. ° °Pick up stool softner and  laxative for home use following surgery while on pain medications. °Do not remove your dressing. °The dressing is waterproof--it is OK to take showers. °Continue to use ice for pain and swelling after surgery. °Do not use any lotions or creams on the incision until instructed by your surgeon. °Total Hip Protocol. ° ° °

## 2016-09-18 NOTE — Anesthesia Preprocedure Evaluation (Addendum)
Anesthesia Evaluation  Patient identified by MRN, date of birth, ID band Patient awake    Reviewed: Allergy & Precautions, NPO status , Patient's Chart, lab work & pertinent test results  History of Anesthesia Complications Negative for: history of anesthetic complications  Airway Mallampati: I  TM Distance: >3 FB Neck ROM: Full    Dental  (+) Dental Advisory Given   Pulmonary former smoker,    breath sounds clear to auscultation       Cardiovascular negative cardio ROS   Rhythm:Regular Rate:Normal  '13 ECHO; EF 60-65%, valves OK   Neuro/Psych CVA, No Residual Symptoms    GI/Hepatic Neg liver ROS, GERD  Medicated and Controlled,  Endo/Other  negative endocrine ROS  Renal/GU negative Renal ROS     Musculoskeletal  (+) Arthritis , Osteoarthritis,    Abdominal   Peds  Hematology negative hematology ROS (+)   Anesthesia Other Findings   Reproductive/Obstetrics                            Anesthesia Physical Anesthesia Plan  ASA: III  Anesthesia Plan: Spinal   Post-op Pain Management:    Induction:   PONV Risk Score and Plan: 2 and Ondansetron and Dexamethasone  Airway Management Planned: Natural Airway and Nasal Cannula  Additional Equipment:   Intra-op Plan:   Post-operative Plan:   Informed Consent: I have reviewed the patients History and Physical, chart, labs and discussed the procedure including the risks, benefits and alternatives for the proposed anesthesia with the patient or authorized representative who has indicated his/her understanding and acceptance.   Dental advisory given  Plan Discussed with: CRNA and Surgeon  Anesthesia Plan Comments: (Plan routine monitors, SAB)        Anesthesia Quick Evaluation

## 2016-09-18 NOTE — Interval H&P Note (Signed)
History and Physical Interval Note:  09/18/2016 11:03 AM  Cory Roberts  has presented today for surgery, with the diagnosis of right hip avascular necrosis  The various methods of treatment have been discussed with the patient and family. After consideration of risks, benefits and other options for treatment, the patient has consented to  Procedure(s): TOTAL HIP ARTHROPLASTY ANTERIOR APPROACH (Right) as a surgical intervention .  The patient's history has been reviewed, patient examined, no change in status, stable for surgery.  I have reviewed the patient's chart and labs.  Questions were answered to the patient's satisfaction.     Dorlene Footman, Cloyde Reams

## 2016-09-18 NOTE — Op Note (Signed)
OPERATIVE REPORT  SURGEON: Samson Frederic, MD   ASSISTANT: Hart Carwin, RNFA.  PREOPERATIVE DIAGNOSIS: Right hip arthritis secondary to avascular necrosis with collapse.   POSTOPERATIVE DIAGNOSIS: Right hip arthritis secondary to avascular necrosis with collapse.   PROCEDURE: Right total hip arthroplasty, anterior approach.   IMPLANTS: Biomet Taperloc Complete Microplasty stem, size 17 x 119, hi offset. Biomet G7 Cup, size 60 mm. Biomet E1 liner, size 36 mm, G, neutral. Biomet Biolox ceramic head ball, size 36 + 3 mm. 6.5 mm cancellus bone screw 2.  ANESTHESIA:  Spinal  ESTIMATED BLOOD LOSS: 400 mL.   ANTIBIOTICS: 2 g Ancef.  DRAINS: None.  COMPLICATIONS: None.   CONDITION: PACU - hemodynamically stable.   BRIEF CLINICAL NOTE: Cory Roberts is a 56 y.o. male with a long-standing history of Right hip avascular necrosis with collapse. After failing conservative management, the patient was indicated for total hip arthroplasty. The risks, benefits, and alternatives to the procedure were explained, and the patient elected to proceed.  PROCEDURE IN DETAIL: Surgical site was marked by myself in the pre-op holding area. Once inside the operating room, spinal anesthesia was obtained, and a foley catheter was inserted. The patient was then positioned on the Hana table. All bony prominences were well padded. The hip was prepped and draped in the normal sterile surgical fashion. A time-out was called verifying side and site of surgery. The patient received IV antibiotics within 60 minutes of beginning the procedure.  The direct anterior approach to the hip was performed through the Hueter interval. Lateral femoral circumflex vessels were treated with the Auqumantys. The anterior capsule was exposed and an inverted T capsulotomy was made.The femoral neck cut was made to the level of the templated cut. A corkscrew was placed into the head and the head was removed. The femoral  head was found to have delaminated cartilage, and collapsed eburnated bone. The head was passed to the back table and was measured.  Acetabular exposure was achieved, and the pulvinar and labrum were excised. Sequential reaming of the acetabulum was then performed up to a size 59 mm reamer. A 60 mm cup was then opened and impacted into place at approximately 45 degrees of abduction and 20 degrees of anteversion. I elected to augment the already acceptable press-fit fixation with 26.5 mm cancellus bone screws. The final polyethylene liner was impacted into place and acetabular osteophytes were removed.   I then gained femoral exposure taking care to protect the abductors and greater trochanter. This was performed using standard external rotation, extension, and adduction. The capsule was peeled off the inner aspect of the greater trochanter, taking care to preserve the short external rotators. A cookie cutter was used to enter the femoral canal, and then the femoral canal finder was placed. Sequential broaching was performed up to a size 17. Calcar planer was used on the femoral neck remnant. I placed a hi offset neck and a trial head ball. The hip was reduced. Leg lengths and offset were checked fluoroscopically. The hip was dislocated and trial components were removed. The final implants were placed, and the hip was reduced.  Fluoroscopy was used to confirm component position and leg lengths. At 90 degrees of external rotation and full extension, the hip was stable to an anterior directed force.  The wound was copiously irrigated with normal saline using pulse lavage. Marcaine solution was injected into the periarticular soft tissue. The wound was closed in layers using #1 Vicryl and V-Loc for the fascia, 2-0  Vicryl for the subcutaneous fat, 2-0 Monocryl for the deep dermal layer, 3-0 running Monocryl subcuticular stitch, and Dermabond for the skin. Once the glue was fully dried, an Aquacell  Ag dressing was applied. The patient was transported to the recovery room in stable condition. Sponge, needle, and instrument counts were correct at the end of the case x2. The patient tolerated the procedure well and there were no known complications.

## 2016-09-18 NOTE — H&P (View-Only) (Signed)
TOTAL HIP ADMISSION H&P  Patient is admitted for right total hip arthroplasty.  Subjective:  Chief Complaint: right hip pain  HPI: Cory Roberts, 56 y.o. male, has a history of pain and functional disability in the right hip(s) due to avn and patient has failed non-surgical conservative treatments for greater than 12 weeks to include NSAID's and/or analgesics, flexibility and strengthening excercises, use of assistive devices, weight reduction as appropriate and activity modification.  Onset of symptoms was gradual starting 4 years ago with gradually worsening course since that time.The patient noted no past surgery on the right hip(s).  Patient currently rates pain in the right hip at 10 out of 10 with activity. Patient has night pain, worsening of pain with activity and weight bearing, trendelenberg gait, pain that interfers with activities of daily living, pain with passive range of motion and crepitus. Patient has evidence of subchondral cysts, subchondral sclerosis, periarticular osteophytes, joint subluxation and joint space narrowing by imaging studies. This condition presents safety issues increasing the risk of falls. This patient has had avascular necrosis of the hip.  There is no current active infection.  Patient Active Problem List   Diagnosis Date Noted  . Encounter for screening colonoscopy 03/08/2015  . PUD (peptic ulcer disease)   . Dysphagia   . Abscess of right thumb 12/18/2014  . Abscess of thumb 12/15/2014  . Cellulitis and abscess 12/15/2014  . Obesity 10/31/2011  . Dyslipidemia 10/31/2011  . At risk for diabetes mellitus 10/31/2011  . Migraine with aura 10/29/2011  . CVA (cerebral infarction) 10/28/2011  . Vertigo 10/28/2011  . Pain in limb 05/15/2011   Past Medical History:  Diagnosis Date  . Arthritis Feb. 2013   Gout- Right foot  . CVA (cerebral infarction)   . GERD (gastroesophageal reflux disease)   . Gout   . Headache    Migraine Headaches  . Raynaud's  syndrome     Past Surgical History:  Procedure Laterality Date  . COLONOSCOPY N/A 03/22/2015   Procedure: COLONOSCOPY;  Surgeon: West BaliSandi L Fields, MD;  Location: [facility];  Service: Endoscopy;  Laterality: N/A;  1215  . ESOPHAGOGASTRODUODENOSCOPY N/A 12/22/2014   Dr. Darrick PennaFields: 1. dysphagia due to mid-esophageal web and uncontrolled reflux esophagitis. 2. small hiatal hernia 3. multiple small gastric and duodenal ulcers and mild duodenitis . Path with reactive gastropathy, negative H. pylori   . ESOPHAGOGASTRODUODENOSCOPY N/A 03/22/2015   Procedure: ESOPHAGOGASTRODUODENOSCOPY (EGD);  Surgeon: West BaliSandi L Fields, MD;  Location: [facility];  Service: Endoscopy;  Laterality: N/A;  . HERNIA REPAIR Left 2007  . INCISION AND DRAINAGE Right 12/18/2014   Procedure: INCISION AND DRAINAGE THUMB;  Surgeon: Vickki HearingStanley E Harrison, MD;  Location: [facility];  Service: Orthopedics;  Laterality: Right;  . INCISION AND DRAINAGE Right 12/21/2014   Procedure: INCISION AND DRAINAGE RIGHT THUMB;  Surgeon: Vickki HearingStanley E Harrison, MD;  Location: [facility];  Service: Orthopedics;  Laterality: Right;  . incision and drainage of thumb  12/18/14     (Not in a hospital admission) Allergies  Allergen Reactions  . Codeine Nausea And Vomiting  . Hydrocodone Nausea Only    Social History  Substance Use Topics  . Smoking status: Former Smoker    Packs/day: 3.00    Years: 30.00    Types: Cigarettes    Quit date: 01/30/2005  . Smokeless tobacco: Not on file  . Alcohol use No    Family History  Problem Relation Age of Onset  . Cancer Mother   .  Hyperlipidemia Mother   . Hypertension Mother   . Cancer Father   . Heart disease Father 42       Heart Disease before age 65  . Heart attack Father   . Diabetes Sister   . Cancer Sister   . Hyperlipidemia Sister   . Hypertension Sister   . Heart attack Mother   . Rheumatologic disease Mother   . Colon cancer Neg Hx      Review of Systems  Constitutional: Negative.   HENT:  Negative.   Eyes: Negative.   Respiratory: Negative.   Cardiovascular: Negative.   Gastrointestinal: Positive for heartburn.  Genitourinary: Negative.   Musculoskeletal: Positive for joint pain.  Skin: Negative.   Neurological: Negative.   Endo/Heme/Allergies: Negative.   Psychiatric/Behavioral: Negative.     Objective:  Physical Exam  Vitals reviewed. Constitutional: He is oriented to person, place, and time. He appears well-developed and well-nourished.  HENT:  Head: Normocephalic and atraumatic.  Eyes: Pupils are equal, round, and reactive to light. Conjunctivae and EOM are normal.  Neck: Normal range of motion. Neck supple.  Cardiovascular: Normal rate, regular rhythm and intact distal pulses.   Respiratory: Effort normal. No respiratory distress.  GI: Soft. He exhibits no distension.  Genitourinary:  Genitourinary Comments: deferred  Musculoskeletal:       Right hip: He exhibits decreased range of motion, decreased strength, swelling and crepitus.  Neurological: He is alert and oriented to person, place, and time. He has normal reflexes.  Skin: Skin is warm and dry.  Psychiatric: He has a normal mood and affect. His behavior is normal. Judgment and thought content normal.    Vital signs in last 24 hours: @VSRANGES @  Labs:   Estimated body mass index is 28.59 kg/m as calculated from the following:   Height as of 03/22/15: 5\' 8"  (1.727 m).   Weight as of 03/22/15: 85.3 kg (188 lb).   Imaging Review Plain radiographs demonstrate severe degenerative joint disease of the right hip(s). The bone quality appears to be adequate for age and reported activity level.  Assessment/Plan:  End stage arthritis, right hip(s)  The patient history, physical examination, clinical judgement of the provider and imaging studies are consistent with end stage degenerative joint disease of the right hip(s) and total hip arthroplasty is deemed medically necessary. The treatment options  including medical management, injection therapy, arthroscopy and arthroplasty were discussed at length. The risks and benefits of total hip arthroplasty were presented and reviewed. The risks due to aseptic loosening, infection, stiffness, dislocation/subluxation,  thromboembolic complications and other imponderables were discussed.  The patient acknowledged the explanation, agreed to proceed with the plan and consent was signed. Patient is being admitted for inpatient treatment for surgery, pain control, PT, OT, prophylactic antibiotics, VTE prophylaxis, progressive ambulation and ADL's and discharge planning.The patient is planning to be discharged home with HEP

## 2016-09-18 NOTE — Transfer of Care (Signed)
Immediate Anesthesia Transfer of Care Note  Patient: Cory Roberts  Procedure(s) Performed: Procedure(s): TOTAL HIP ARTHROPLASTY ANTERIOR APPROACH (Right)  Patient Location: PACU  Anesthesia Type:Spinal  Level of Consciousness: alert , oriented, drowsy and patient cooperative  Airway & Oxygen Therapy: Patient Spontanous Breathing and Patient connected to face mask oxygen  Post-op Assessment: Report given to RN and Post -op Vital signs reviewed and stable  Post vital signs: Reviewed and stable  Last Vitals:  Vitals:   09/18/16 1002  BP: (!) 143/99  Pulse: 67  Resp: 18  Temp: 36.8 C  SpO2: 98%    Last Pain:  Vitals:   09/18/16 1002  TempSrc: Oral  PainSc: 10-Worst pain ever         Complications: No apparent anesthesia complications

## 2016-09-18 NOTE — Anesthesia Procedure Notes (Signed)
Procedure Name: MAC Date/Time: 09/18/2016 11:29 AM Performed by: Everlean Cherry A Pre-anesthesia Checklist: Patient identified, Emergency Drugs available, Suction available and Patient being monitored Patient Re-evaluated:Patient Re-evaluated prior to induction Oxygen Delivery Method: Simple face mask

## 2016-09-18 NOTE — Anesthesia Procedure Notes (Signed)
Spinal  Patient location during procedure: OR End time: 09/18/2016 11:24 AM Staffing Anesthesiologist: Jairo Ben Performed: anesthesiologist  Preanesthetic Checklist Completed: patient identified, site marked, surgical consent, pre-op evaluation, timeout performed, IV checked, risks and benefits discussed and monitors and equipment checked Spinal Block Patient position: sitting Prep: ChloraPrep and site prepped and draped Patient monitoring: blood pressure, continuous pulse ox, cardiac monitor and heart rate Approach: midline Location: L3-4 Injection technique: single-shot Needle Needle type: Quincke  Needle gauge: 25 G Needle length: 9 cm Additional Notes Pt identified in Operating room.  Monitors applied. Working IV access confirmed. Sterile prep, drape lumbar spine.  1% lido local L 3,4.  #25ga Quincke into clear CSF L 3,4.  15mg  0.75% Bupivacaine with dextrose injected with asp CSF beginning and end of injection.  Patient asymptomatic, VSS, no heme aspirated, tolerated well.  Sandford Craze, MD

## 2016-09-19 ENCOUNTER — Encounter (HOSPITAL_COMMUNITY): Payer: Self-pay | Admitting: General Practice

## 2016-09-19 LAB — BASIC METABOLIC PANEL
ANION GAP: 4 — AB (ref 5–15)
BUN: 18 mg/dL (ref 6–20)
CHLORIDE: 102 mmol/L (ref 101–111)
CO2: 29 mmol/L (ref 22–32)
CREATININE: 1.16 mg/dL (ref 0.61–1.24)
Calcium: 8.5 mg/dL — ABNORMAL LOW (ref 8.9–10.3)
GFR calc non Af Amer: >60 mL/min (ref >60)
Glucose, Bld: 139 mg/dL — ABNORMAL HIGH (ref 65–99)
POTASSIUM: 4.1 mmol/L (ref 3.5–5.1)
SODIUM: 135 mmol/L (ref 135–145)

## 2016-09-19 LAB — CBC
HEMATOCRIT: 32.1 % — AB (ref 39.0–52.0)
HEMOGLOBIN: 10.8 g/dL — AB (ref 13.0–17.0)
MCH: 32.2 pg (ref 26.0–34.0)
MCHC: 33.6 g/dL (ref 30.0–36.0)
MCV: 95.8 fL (ref 78.0–100.0)
Platelets: 161 10*3/uL (ref 150–400)
RBC: 3.35 MIL/uL — AB (ref 4.22–5.81)
RDW: 13.4 % (ref 11.5–15.5)
WBC: 9.2 10*3/uL (ref 4.0–10.5)

## 2016-09-19 MED ORDER — ASPIRIN 81 MG PO CHEW: 81.0000 mg | CHEWABLE_TABLET | Freq: Two times a day (BID) | ORAL | 1 refills | Status: DC

## 2016-09-19 MED ORDER — OXYCODONE HCL 5 MG PO TABS: 5.0000 mg | ORAL_TABLET | ORAL | 0 refills | Status: DC | PRN

## 2016-09-19 MED ORDER — SENNA 8.6 MG PO TABS: 2.0000 | ORAL_TABLET | Freq: Every day | ORAL | 0 refills | Status: DC

## 2016-09-19 MED ORDER — DOCUSATE SODIUM 100 MG PO CAPS: 100.0000 mg | ORAL_CAPSULE | Freq: Two times a day (BID) | ORAL | 1 refills | Status: DC

## 2016-09-19 MED ORDER — ONDANSETRON HCL 4 MG PO TABS: 4.0000 mg | ORAL_TABLET | Freq: Four times a day (QID) | ORAL | 0 refills | Status: DC | PRN

## 2016-09-19 NOTE — Progress Notes (Signed)
Physical Therapy Treatment Patient Details Name: Cory Roberts MRN: [id number] DOB: 14-Aug-1960 Today's Date: 09/19/2016    History of Present Illness Pt is a 56 y/o male s/p elective R THA. PMH includes CVA, gout, raynaud's syndrome, and vertigo.     PT Comments    Pt performed increased mobility and reviewed stair training for safe entry into home.  Pt also reviewed the standing exercises and educated on performing this plan 3x daily.   Plan to d/c home this afternoon with support from his wife.  Follow Up Recommendations  DC plan and follow up therapy as arranged by surgeon;Supervision for mobility/OOB     Equipment Recommendations  None recommended by PT    Recommendations for Other Services       Precautions / Restrictions Precautions Precautions: None Precaution Comments: Reviewed THA exercise handout with pt.  Restrictions Weight Bearing Restrictions: Yes RLE Weight Bearing: Weight bearing as tolerated    Mobility  Bed Mobility Overal bed mobility: Needs Assistance Bed Mobility: Supine to Sit     Supine to sit: Supervision     General bed mobility comments: Pt sitting in recliner on arrival.    Transfers Overall transfer level: Needs assistance Equipment used: Rolling walker (2 wheeled) Transfers: Sit to/from Stand Sit to Stand: Supervision         General transfer comment: Cues for safety and hand placement, better carryover observed.   Ambulation/Gait Ambulation/Gait assistance: Supervision Ambulation Distance (Feet): 350 Feet Assistive device: Rolling walker (2 wheeled) Gait Pattern/deviations: Step-through pattern;Decreased step length - left Gait velocity: Decreased Gait velocity interpretation: Below normal speed for age/gender General Gait Details: Better carryover with weight shifting and more normalized pattern, cues remain to correct symmetry.   Stairs Stairs: Yes   Stair Management: Step to pattern;Backwards;With walker Number of  Stairs: 4 General stair comments: Cues for sequenicng and RW placement, assist provided to stabilize RW only.    Wheelchair Mobility    Modified Rankin (Stroke Patients Only)       Balance Overall balance assessment: Needs assistance Sitting-balance support: No upper extremity supported;Feet supported Sitting balance-[name]: Good       Standing balance-[name]: Fair Standing balance comment: Reliant on RW for stability                             Cognition Arousal/Alertness: Awake/alert Behavior During Therapy: WFL for tasks assessed/performed Overall Cognitive Status: Within Functional Limits for tasks assessed                                        Exercises Total Joint Exercises Ankle Circles/Pumps: AROM;Both;20 reps;Supine Quad Sets: AROM;Right;10 reps;Supine Short Arc Quad: AROM;Right;10 reps;Supine Heel Slides: AROM;Right;10 reps;Supine Hip ABduction/ADduction: AROM;Right;10 reps;Standing Long Arc Quad: AROM;Right;10 reps;Seated Knee Flexion: AROM;Right;10 reps;Standing Marching in Standing: AROM;Right;10 reps;Standing Standing Hip Extension: AROM;Right;10 reps;Standing    General Comments        Pertinent Vitals/Pain Pain Assessment: 0-10 Pain Score: 6  Pain Location: L hip  Pain Descriptors / Indicators: Aching;Operative site guarding;Tightness Pain Intervention(s): Monitored during session;Repositioned    Home Living Family/patient expects to be discharged to:: Private residence Living Arrangements: Spouse/significant other                  Prior Function            PT Goals (current goals  can now be found in the care plan section) Acute Rehab PT Goals Patient Stated Goal: to go home  Potential to Achieve Goals: Good Progress towards PT goals: Progressing toward goals    Frequency    7X/week      PT Plan Current plan remains appropriate    Co-evaluation              AM-PAC PT "6  Clicks" Daily Activity  Outcome Measure  Difficulty turning over in bed (including adjusting bedclothes, sheets and blankets)?: None Difficulty moving from lying on back to sitting on the side of the bed? : None Difficulty sitting down on and standing up from a chair with arms (e.g., wheelchair, bedside commode, etc,.)?: A Little Help needed moving to and from a bed to chair (including a wheelchair)?: A Little Help needed walking in hospital room?: A Little Help needed climbing 3-5 steps with a railing? : A Little 6 Click Score: 20    End of Session Equipment Utilized During Treatment: Gait belt Activity Tolerance: Patient tolerated treatment well Patient left: in chair;with call bell/phone within reach;with family/visitor present Nurse Communication: Mobility status PT Visit Diagnosis: Other abnormalities of gait and mobility (R26.89);Pain Pain - Right/Left: Right Pain - part of body: Hip     Time: 1771-1657 PT Time Calculation (min) (ACUTE ONLY): 25 min  Charges:  $Gait Training: 8-22 mins $Therapeutic Exercise: 8-22 mins                    G Codes:       Joycelyn Rua, PTA pager [phone]    Florestine Avers 09/19/2016, 2:18 PM

## 2016-09-19 NOTE — Progress Notes (Signed)
   Subjective:  Patient reports pain as mild to moderate.  Denies N/V/CP/SOB.  Objective:   VITALS:   Vitals:   09/18/16 1610 09/18/16 2120 09/19/16 0032 09/19/16 0513  BP: 103/67 103/65 106/60 101/60  Pulse: 70 88 86 82  Resp:  16 16 16   Temp: 97.8 F (36.6 C) 98.4 F (36.9 C) 98.4 F (36.9 C) 99.1 F (37.3 C)  TempSrc: Oral Oral Oral Oral  SpO2: 100% 97% 100% 100%  Weight:        NAD ABD soft Sensation intact distally Intact pulses distally Dorsiflexion/Plantar flexion intact Incision: dressing C/D/I Compartment soft   Lab Results  Component Value Date   WBC 9.2 09/19/2016   HGB 10.8 (L) 09/19/2016   HCT 32.1 (L) 09/19/2016   MCV 95.8 09/19/2016   PLT 161 09/19/2016   BMET    Component Value Date/Time   NA 135 09/19/2016 0338   K 4.1 09/19/2016 0338   CL 102 09/19/2016 0338   CO2 29 09/19/2016 0338   GLUCOSE 139 (H) 09/19/2016 0338   BUN 18 09/19/2016 0338   CREATININE 1.16 09/19/2016 0338   CALCIUM 8.5 (L) 09/19/2016 0338   GFRNONAA >60 09/19/2016 0338   GFRAA >60 09/19/2016 0338     Assessment/Plan: 1 Day Post-Op   Principal Problem:   Avascular necrosis of hip, right (HCC)   WBAT with walker ASA, SCDs, TEDs PO pain control PT/OT Dispo: D/C home with HEP   Tasmine Hipwell, Cloyde Reams 09/19/2016, 1:04 PM   Samson Frederic, MD Cell [phone]

## 2016-09-19 NOTE — Care Management Note (Signed)
Case Management Note  Patient Details  Name: VONTAE KAPLOWITZ MRN: [id number] Date of Birth: Jan 18, 1961  Subjective/Objective:   56 yr old gentleman s/p right total hip arthroplasty.                   Action/Plan: Case manager spoke with patient and his wife concerning discharge plan and DME. Patient has RW and 3in1, says he was told prior to surgery that he would not need HHPT. CM did confirm with Dr. Linna Caprice. Patient will discharge home with family support.   Expected Discharge Date:   09/19/16               Expected Discharge Plan:  Home/Self Care  In-House Referral:     Discharge planning Services  CM Consult  Post Acute Care Choice:  NA Choice offered to:  Patient  DME Arranged:    DME Agency:  NA  HH Arranged:  NA HH Agency:  NA, [facility]  Status of Service:  Completed, signed off  If discussed at [employer], dates discussed:    Additional Comments:  Durenda Guthrie, RN 09/19/2016, 12:13 PM

## 2016-09-19 NOTE — Discharge Summary (Signed)
Physician Discharge Summary  Patient ID: Cory Roberts MRN: [id number] DOB/AGE: 1960-07-01 56 y.o.  Admit date: 09/18/2016 Discharge date: 09/19/2016  Admission Diagnoses:  Avascular necrosis of hip, right [facility])  Discharge Diagnoses:  Principal Problem:   Avascular necrosis of hip, right [facility])   Past Medical History:  Diagnosis Date  . Arthritis Feb. 2013   Gout- Right foot  . CVA (cerebral infarction)   . GERD (gastroesophageal reflux disease)   . Gout   . Headache    Migraine Headaches  . Raynaud's syndrome   . Stroke [facility]) 2011    Surgeries: Procedure(s): TOTAL HIP ARTHROPLASTY ANTERIOR APPROACH on 09/18/2016   Consultants (if any):   Discharged Condition: Improved  Hospital Course: CAETANO Roberts is an 56 y.o. male who was admitted 09/18/2016 with a diagnosis of Avascular necrosis of hip, right (HCC) and went to the operating room on 09/18/2016 and underwent the above named procedures.    He was given perioperative antibiotics:  Anti-infectives    Start     Dose/Rate Route Frequency Ordered Stop   09/18/16 2000  vancomycin (VANCOCIN) IVPB 1000 mg/200 mL premix     1,000 mg 200 mL/hr over 60 Minutes Intravenous Every 12 hours 09/18/16 1556 09/18/16 2111   09/18/16 0958  ceFAZolin (ANCEF) IVPB 2g/100 mL premix     2 g 200 mL/hr over 30 Minutes Intravenous On call to O.R. 09/18/16 0958 09/18/16 1146   09/18/16 0955  ceFAZolin (ANCEF) 2-4 GM/100ML-% IVPB    Comments:  Lorenda Ishihara   : cabinet override      09/18/16 0955 09/18/16 1116    .  He was given sequential compression devices, early ambulation, and ASA for DVT prophylaxis.  He benefited maximally from the hospital stay and there were no complications.    Recent vital signs:  Vitals:   09/19/16 0513 09/19/16 1416  BP: 101/60 118/62  Pulse: 82 97  Resp: 16 18  Temp: 99.1 F (37.3 C) 98 F (36.7 C)  SpO2: 100%     Recent laboratory studies:  Lab Results  Component Value Date   HGB 10.8 (L)  09/19/2016   HGB 13.4 09/11/2016   HGB 13.3 12/21/2014   Lab Results  Component Value Date   WBC 9.2 09/19/2016   PLT 161 09/19/2016   Lab Results  Component Value Date   INR 1.12 12/17/2014   Lab Results  Component Value Date   NA 135 09/19/2016   K 4.1 09/19/2016   CL 102 09/19/2016   CO2 29 09/19/2016   BUN 18 09/19/2016   CREATININE 1.16 09/19/2016   GLUCOSE 139 (H) 09/19/2016    Discharge Medications:   Allergies as of 09/19/2016      Reactions   Codeine Nausea And Vomiting   Hydrocodone Nausea Only      Medication List    STOP taking these medications   aspirin 325 MG tablet Replaced by:  aspirin 81 MG chewable tablet   BIOFREEZE EX   traMADol 50 MG tablet Commonly known as:  ULTRAM     TAKE these medications   aspirin 81 MG chewable tablet Chew 1 tablet (81 mg total) by mouth 2 (two) times daily. Replaces:  aspirin 325 MG tablet   docusate sodium 100 MG capsule Commonly known as:  COLACE Take 1 capsule (100 mg total) by mouth 2 (two) times daily.   naproxen sodium 220 MG tablet Commonly known as:  ANAPROX Take 440 mg by mouth every 4 (four) hours  as needed (for pain.).   ondansetron 4 MG tablet Commonly known as:  ZOFRAN Take 1 tablet (4 mg total) by mouth every 6 (six) hours as needed for nausea.   oxyCODONE 5 MG immediate release tablet Commonly known as:  Oxy IR/ROXICODONE Take 1-2 tablets (5-10 mg total) by mouth every 3 (three) hours as needed for breakthrough pain.   pantoprazole 40 MG tablet Commonly known as:  PROTONIX Take 30 minutes prior to breakfast daily.   senna 8.6 MG Tabs tablet Commonly known as:  SENOKOT Take 2 tablets (17.2 mg total) by mouth at bedtime.            Durable Medical Equipment        Start     Ordered   09/18/16 1557  DME Walker rolling  Once    Question:  Patient needs a walker to treat with the following condition  Answer:  Status post total replacement of right hip   09/18/16 1556   09/18/16  1557  DME 3 n 1  Once     09/18/16 1556      Diagnostic Studies: Dg Pelvis Portable  Result Date: 09/18/2016 CLINICAL DATA:  Right hip arthroplasty. EXAM: PORTABLE PELVIS 1-2 VIEWS COMPARISON:  Pelvic x-rays dated September 21, 2014. FINDINGS: Postsurgical changes related to interval right total hip arthroplasty. Components are well aligned. Expected postsurgical changes including subcutaneous and intra-articular emphysema are noted. Lucency over the right puboacetabular junction likely represents postsurgical air, less likely fracture. Left hip joint is unremarkable. IMPRESSION: Interval right total hip arthroplasty without evidence of acute postoperative complication. Electronically Signed   By: Cory Roberts M.D.   On: 09/18/2016 16:42   Dg C-arm 61-120 Min  Result Date: 09/18/2016 CLINICAL DATA:  Right hip arthroplasty. EXAM: OPERATIVE RIGHT HIP (WITH PELVIS IF PERFORMED) TECHNIQUE: Fluoroscopic spot image(s) were submitted for interpretation post-operatively. COMPARISON:  Pelvic x-rays dated September 21, 2014. FLUOROSCOPY TIME:  31 seconds. C-arm fluoroscopic images were obtained intraoperatively and submitted for post operative interpretation. FINDINGS: Intraoperative x-rays demonstrating interval right total hip arthroplasty. Components appear well aligned. IMPRESSION: Interval right total hip arthroplasty. Electronically Signed   By: Cory Roberts M.D.   On: 09/18/2016 14:05   Dg Hip Operative Unilat W Or W/o Pelvis Right  Result Date: 09/18/2016 CLINICAL DATA:  Right hip arthroplasty. EXAM: OPERATIVE RIGHT HIP (WITH PELVIS IF PERFORMED) TECHNIQUE: Fluoroscopic spot image(s) were submitted for interpretation post-operatively. COMPARISON:  Pelvic x-rays dated September 21, 2014. FLUOROSCOPY TIME:  31 seconds. C-arm fluoroscopic images were obtained intraoperatively and submitted for post operative interpretation. FINDINGS: Intraoperative x-rays demonstrating interval right total hip arthroplasty.  Components appear well aligned. IMPRESSION: Interval right total hip arthroplasty. Electronically Signed   By: Cory Roberts M.D.   On: 09/18/2016 14:05    Disposition: 01-Home or Self Care  Discharge Instructions    Call MD / Call 911    Complete by:  As directed    If you experience chest pain or shortness of breath, CALL 911 and be transported to the hospital emergency room.  If you develope a fever above 101 F, pus (white drainage) or increased drainage or redness at the wound, or calf pain, call your surgeon's office.   Constipation Prevention    Complete by:  As directed    Drink plenty of fluids.  Prune juice may be helpful.  You may use a stool softener, such as Colace (over the counter) 100 mg twice a day.  Use MiraLax (over the counter)  for constipation as needed.   Diet - low sodium heart healthy    Complete by:  As directed    Driving restrictions    Complete by:  As directed    No driving for 6 weeks   Increase activity slowly as tolerated    Complete by:  As directed    Lifting restrictions    Complete by:  As directed    No lifting for 6 weeks   TED hose    Complete by:  As directed    Use stockings (TED hose) for 2 weeks on both leg(s).  You may remove them at night for sleeping.      Follow-up Information    Francesco Provencal, Arlys John, MD. Schedule an appointment as soon as possible for a visit in 2 week(s).   Specialty:  Orthopedic Surgery Why:  For wound re-check Contact information: [address] [place] [address] [phone]            Signed: Garnet Koyanagi 09/19/2016, 6:06 PM

## 2016-09-19 NOTE — Progress Notes (Signed)
Physical Therapy Treatment Patient Details Name: Cory Roberts MRN: [id number] DOB: 03-10-1960 Today's Date: 09/19/2016    History of Present Illness Pt is a 56 y/o male s/p elective R THA. PMH includes CVA, gout, raynaud's syndrome, and vertigo.     PT Comments    Pt performed increased gait and reviewed seated and supine exercises in prep for d/c home.  Plan this pm for stair training and progression to standing exercises.  Educated patient and spouse on correct RW height and how to adjust when he gets home.     Follow Up Recommendations  DC plan and follow up therapy as arranged by surgeon;Supervision for mobility/OOB     Equipment Recommendations  None recommended by PT    Recommendations for Other Services       Precautions / Restrictions Precautions Precautions: None Precaution Comments: Reviewed THA exercise handout with pt.  Restrictions Weight Bearing Restrictions: Yes RLE Weight Bearing: Weight bearing as tolerated    Mobility  Bed Mobility Overal bed mobility: Needs Assistance Bed Mobility: Supine to Sit     Supine to sit: Supervision     General bed mobility comments: Cues for technique for safety but no physical assistance required.    Transfers Overall transfer level: Needs assistance Equipment used: Rolling walker (2 wheeled) Transfers: Sit to/from Stand Sit to Stand: Min assist         General transfer comment: Min assist to control descent to seated surface.  Performed multiple reps from chair to encourage safety with transfer.  Pt with improved eccentric loading with increased reps.    Ambulation/Gait Ambulation/Gait assistance: Min guard Ambulation Distance (Feet): 212 Feet Assistive device: Rolling walker (2 wheeled) Gait Pattern/deviations: Step-through pattern;Trunk flexed;Antalgic;Decreased step length - left Gait velocity: Decreased Gait velocity interpretation: Below normal speed for age/gender General Gait Details: Cues for gait  symmetry with step through pattern.  Cues for forward gaze and R heel strike to improve quad control.     Stairs            Wheelchair Mobility    Modified Rankin (Stroke Patients Only)       Balance     Sitting balance-[name]: Good       Standing balance-[name]: Fair                              Cognition Arousal/Alertness: Awake/alert Behavior During Therapy: WFL for tasks assessed/performed Overall Cognitive Status: Within Functional Limits for tasks assessed                                        Exercises Total Joint Exercises Ankle Circles/Pumps: AROM;Both;20 reps;Supine Quad Sets: AROM;Right;10 reps;Supine Short Arc Quad: AROM;Right;10 reps;Supine Heel Slides: AROM;Right;10 reps;Supine Hip ABduction/ADduction: AROM;Right;10 reps;Supine Long Arc Quad: AROM;Right;10 reps;Seated    General Comments        Pertinent Vitals/Pain Pain Assessment: 0-10 Pain Score: 6  Pain Location: L hip  Pain Descriptors / Indicators: Aching;Operative site guarding;Tightness Pain Intervention(s): Monitored during session;Repositioned;Ice applied    Home Living Family/patient expects to be discharged to:: Private residence Living Arrangements: Spouse/significant other                  Prior Function            PT Goals (current goals can now be found in the care  plan section) Acute Rehab PT Goals Patient Stated Goal: to go home  Potential to Achieve Goals: Good Progress towards PT goals: Progressing toward goals    Frequency    7X/week      PT Plan Current plan remains appropriate    Co-evaluation              AM-PAC PT "6 Clicks" Daily Activity  Outcome Measure  Difficulty turning over in bed (including adjusting bedclothes, sheets and blankets)?: A Little Difficulty moving from lying on back to sitting on the side of the bed? : A Little Difficulty sitting down on and standing up from a chair with  arms (e.g., wheelchair, bedside commode, etc,.)?: A Little Help needed moving to and from a bed to chair (including a wheelchair)?: A Little Help needed walking in hospital room?: A Little Help needed climbing 3-5 steps with a railing? : A Little 6 Click Score: 18    End of Session Equipment Utilized During Treatment: Gait belt Activity Tolerance: Patient tolerated treatment well Patient left: in chair;with call bell/phone within reach;with family/visitor present Nurse Communication: Mobility status PT Visit Diagnosis: Other abnormalities of gait and mobility (R26.89);Pain Pain - Right/Left: Right Pain - part of body: Hip     Time: 9937-1696 PT Time Calculation (min) (ACUTE ONLY): 25 min  Charges:  $Gait Training: 8-22 mins $Therapeutic Exercise: 8-22 mins                    G Codes:       Joycelyn Rua, PTA pager [phone]    Florestine Avers 09/19/2016, 11:21 AM

## 2016-09-19 NOTE — Progress Notes (Signed)
Pt ready for discharge. Education/instructions reviewed with pt and spouse, and all questions/concerns addressed. IV removed and belongings gathered (including prescriptions). Pt will be transported out via wheelchair to wife's vehicle.

## 2016-10-03 DIAGNOSIS — Z96641 Presence of right artificial hip joint: Secondary | ICD-10-CM | POA: Diagnosis not present

## 2016-10-03 DIAGNOSIS — Z471 Aftercare following joint replacement surgery: Secondary | ICD-10-CM | POA: Diagnosis not present

## 2016-10-19 DIAGNOSIS — Z139 Encounter for screening, unspecified: Secondary | ICD-10-CM | POA: Diagnosis not present

## 2016-10-19 DIAGNOSIS — E785 Hyperlipidemia, unspecified: Secondary | ICD-10-CM | POA: Diagnosis not present

## 2016-10-19 DIAGNOSIS — R7301 Impaired fasting glucose: Secondary | ICD-10-CM | POA: Diagnosis not present

## 2016-10-19 DIAGNOSIS — Z719 Counseling, unspecified: Secondary | ICD-10-CM | POA: Diagnosis not present

## 2016-10-19 DIAGNOSIS — Z008 Encounter for other general examination: Secondary | ICD-10-CM | POA: Diagnosis not present

## 2016-10-19 DIAGNOSIS — I639 Cerebral infarction, unspecified: Secondary | ICD-10-CM | POA: Diagnosis not present

## 2016-11-01 DIAGNOSIS — Z96641 Presence of right artificial hip joint: Secondary | ICD-10-CM | POA: Diagnosis not present

## 2016-11-01 DIAGNOSIS — Z471 Aftercare following joint replacement surgery: Secondary | ICD-10-CM | POA: Diagnosis not present

## 2016-11-02 DIAGNOSIS — E876 Hypokalemia: Secondary | ICD-10-CM | POA: Diagnosis not present

## 2016-11-23 DIAGNOSIS — Z008 Encounter for other general examination: Secondary | ICD-10-CM | POA: Diagnosis not present

## 2016-12-18 ENCOUNTER — Other Ambulatory Visit: Payer: Self-pay | Admitting: Gastroenterology

## 2017-02-23 ENCOUNTER — Encounter (HOSPITAL_COMMUNITY): Payer: Self-pay | Admitting: *Deleted

## 2017-02-23 ENCOUNTER — Emergency Department (HOSPITAL_COMMUNITY): Payer: BLUE CROSS/BLUE SHIELD

## 2017-02-23 ENCOUNTER — Other Ambulatory Visit: Payer: Self-pay

## 2017-02-23 ENCOUNTER — Inpatient Hospital Stay (HOSPITAL_COMMUNITY)
Admission: EM | Admit: 2017-02-23 | Discharge: 2017-02-27 | DRG: 470 | Disposition: A | Discharge status: Home Health | Payer: BLUE CROSS/BLUE SHIELD | Attending: Internal Medicine | Admitting: Internal Medicine

## 2017-02-23 DIAGNOSIS — Y9301 Activity, walking, marching and hiking: Secondary | ICD-10-CM | POA: Diagnosis present

## 2017-02-23 DIAGNOSIS — W07XXXA Fall from chair, initial encounter: Secondary | ICD-10-CM | POA: Diagnosis not present

## 2017-02-23 DIAGNOSIS — N289 Disorder of kidney and ureter, unspecified: Secondary | ICD-10-CM | POA: Diagnosis not present

## 2017-02-23 DIAGNOSIS — D72829 Elevated white blood cell count, unspecified: Secondary | ICD-10-CM | POA: Diagnosis not present

## 2017-02-23 DIAGNOSIS — Y990 Civilian activity done for income or pay: Secondary | ICD-10-CM | POA: Diagnosis not present

## 2017-02-23 DIAGNOSIS — I7300 Raynaud's syndrome without gangrene: Secondary | ICD-10-CM | POA: Diagnosis not present

## 2017-02-23 DIAGNOSIS — E785 Hyperlipidemia, unspecified: Secondary | ICD-10-CM | POA: Diagnosis not present

## 2017-02-23 DIAGNOSIS — S72002A Fracture of unspecified part of neck of left femur, initial encounter for closed fracture: Secondary | ICD-10-CM | POA: Diagnosis not present

## 2017-02-23 DIAGNOSIS — R52 Pain, unspecified: Secondary | ICD-10-CM

## 2017-02-23 DIAGNOSIS — M25552 Pain in left hip: Secondary | ICD-10-CM | POA: Diagnosis not present

## 2017-02-23 DIAGNOSIS — S72092A Other fracture of head and neck of left femur, initial encounter for closed fracture: Secondary | ICD-10-CM | POA: Diagnosis not present

## 2017-02-23 DIAGNOSIS — Z8673 Personal history of transient ischemic attack (TIA), and cerebral infarction without residual deficits: Secondary | ICD-10-CM

## 2017-02-23 DIAGNOSIS — Z7982 Long term (current) use of aspirin: Secondary | ICD-10-CM | POA: Diagnosis not present

## 2017-02-23 DIAGNOSIS — Z87891 Personal history of nicotine dependence: Secondary | ICD-10-CM | POA: Diagnosis not present

## 2017-02-23 DIAGNOSIS — M87051 Idiopathic aseptic necrosis of right femur: Secondary | ICD-10-CM | POA: Diagnosis not present

## 2017-02-23 DIAGNOSIS — Y9289 Other specified places as the place of occurrence of the external cause: Secondary | ICD-10-CM

## 2017-02-23 DIAGNOSIS — Z419 Encounter for procedure for purposes other than remedying health state, unspecified: Secondary | ICD-10-CM

## 2017-02-23 DIAGNOSIS — Z96641 Presence of right artificial hip joint: Secondary | ICD-10-CM | POA: Diagnosis present

## 2017-02-23 DIAGNOSIS — K279 Peptic ulcer, site unspecified, unspecified as acute or chronic, without hemorrhage or perforation: Secondary | ICD-10-CM | POA: Diagnosis not present

## 2017-02-23 LAB — CBC WITH DIFFERENTIAL/PLATELET
BASOS ABS: 0 10*3/uL (ref 0.0–0.1)
BASOS PCT: 0 %
EOS ABS: 0.1 10*3/uL (ref 0.0–0.7)
EOS PCT: 1 %
HCT: 43.8 % (ref 39.0–52.0)
Hemoglobin: 14.2 g/dL (ref 13.0–17.0)
LYMPHS ABS: 1.6 10*3/uL (ref 0.7–4.0)
Lymphocytes Relative: 12 %
MCH: 30 pg (ref 26.0–34.0)
MCHC: 32.4 g/dL (ref 30.0–36.0)
MCV: 92.6 fL (ref 78.0–100.0)
Monocytes Absolute: 0.8 10*3/uL (ref 0.1–1.0)
Monocytes Relative: 6 %
NEUTROS PCT: 81 %
Neutro Abs: 11.1 10*3/uL — ABNORMAL HIGH (ref 1.7–7.7)
PLATELETS: 204 10*3/uL (ref 150–400)
RBC: 4.73 MIL/uL (ref 4.22–5.81)
RDW: 14.2 % (ref 11.5–15.5)
WBC: 13.6 10*3/uL — AB (ref 4.0–10.5)

## 2017-02-23 LAB — PROTIME-INR
INR: 0.99
Prothrombin Time: 13 seconds (ref 11.4–15.2)

## 2017-02-23 LAB — BASIC METABOLIC PANEL
Anion gap: 10 (ref 5–15)
BUN: 32 mg/dL — AB (ref 6–20)
CALCIUM: 9.3 mg/dL (ref 8.9–10.3)
CO2: 24 mmol/L (ref 22–32)
CREATININE: 1.29 mg/dL — AB (ref 0.61–1.24)
Chloride: 105 mmol/L (ref 101–111)
GFR, EST NON AFRICAN AMERICAN: 59 mL/min — AB (ref >60)
Glucose, Bld: 110 mg/dL — ABNORMAL HIGH (ref 65–99)
Potassium: 3.6 mmol/L (ref 3.5–5.1)
SODIUM: 139 mmol/L (ref 135–145)

## 2017-02-23 LAB — TYPE AND SCREEN
ABO/RH(D): A POS
ANTIBODY SCREEN: NEGATIVE

## 2017-02-23 MED ORDER — DOCUSATE SODIUM 100 MG PO CAPS
100.0000 mg | ORAL_CAPSULE | Freq: Two times a day (BID) | ORAL | Status: DC
  Administered 2017-02-24 – 2017-02-27 (×6): 100 mg via ORAL
  Filled 2017-02-23 (×6): qty 1

## 2017-02-23 MED ORDER — SODIUM CHLORIDE 0.9 % IV SOLN
INTRAVENOUS | Status: AC
  Administered 2017-02-23: via INTRAVENOUS

## 2017-02-23 MED ORDER — SENNA 8.6 MG PO TABS
2.0000 | ORAL_TABLET | Freq: Every day | ORAL | Status: DC
  Administered 2017-02-24 – 2017-02-26 (×4): 17.2 mg via ORAL
  Filled 2017-02-23 (×3): qty 2

## 2017-02-23 MED ORDER — FENTANYL CITRATE (PF) 100 MCG/2ML IJ SOLN
50.0000 ug | INTRAMUSCULAR | Status: DC | PRN
  Administered 2017-02-23: 50 ug via INTRAVENOUS
  Filled 2017-02-23: qty 2

## 2017-02-23 MED ORDER — PANTOPRAZOLE SODIUM 40 MG PO TBEC
40.0000 mg | DELAYED_RELEASE_TABLET | Freq: Every day | ORAL | Status: DC
  Administered 2017-02-24 – 2017-02-27 (×4): 40 mg via ORAL
  Filled 2017-02-23 (×4): qty 1

## 2017-02-23 MED ORDER — METHOCARBAMOL 500 MG PO TABS
500.0000 mg | ORAL_TABLET | Freq: Four times a day (QID) | ORAL | Status: DC | PRN
  Administered 2017-02-24 (×3): 500 mg via ORAL
  Filled 2017-02-23 (×3): qty 1

## 2017-02-23 MED ORDER — METHOCARBAMOL 1000 MG/10ML IJ SOLN
500.0000 mg | Freq: Four times a day (QID) | INTRAVENOUS | Status: DC | PRN
  Filled 2017-02-23: qty 5

## 2017-02-23 MED ORDER — MORPHINE SULFATE (PF) 2 MG/ML IV SOLN
2.0000 mg | Freq: Once | INTRAVENOUS | Status: AC
  Administered 2017-02-23: 2 mg via INTRAVENOUS
  Filled 2017-02-23: qty 1

## 2017-02-23 MED ORDER — OXYCODONE HCL 5 MG PO TABS: 5.0000 mg | ORAL_TABLET | ORAL | Status: DC | PRN

## 2017-02-23 NOTE — ED Triage Notes (Addendum)
Pt was at work and he had sudden sharp in in his left hip down to his knee. Pt recently had a hip replacement on the right hip. Pt states he was unable to walk and had to be placed in a wheelchair work.

## 2017-02-23 NOTE — ED Provider Notes (Signed)
[facility] EMERGENCY DEPARTMENT Provider Note   CSN: [id number] Arrival date & time: 02/23/17  2018     History   Chief Complaint Chief Complaint  Patient presents with  . Hip Pain    HPI Cory Roberts is a 57 y.o. male.  HPI 57 year old man history of avascular necrosis, status post right hip replacement presents today with sudden onset of left hip pain today while he was at work.  Pain is severe and in the left hip.  There is no radiation.  He is unable to bear weight.  He denies any numbness or tingling distal to the injury.  This occurred while he was walking. Past Medical History:  Diagnosis Date  . Arthritis Feb. 2013   Gout- Right foot  . CVA (cerebral infarction)   . GERD (gastroesophageal reflux disease)   . Gout   . Headache    Migraine Headaches  . Raynaud's syndrome   . Stroke [facility]) 2011    Patient Active Problem List   Diagnosis Date Noted  . Avascular necrosis of hip, right (HCC) 09/18/2016  . Encounter for screening colonoscopy 03/08/2015  . PUD (peptic ulcer disease)   . Dysphagia   . Abscess of right thumb 12/18/2014  . Abscess of thumb 12/15/2014  . Cellulitis and abscess 12/15/2014  . Obesity 10/31/2011  . Dyslipidemia 10/31/2011  . At risk for diabetes mellitus 10/31/2011  . Migraine with aura 10/29/2011  . CVA (cerebral infarction) 10/28/2011  . Vertigo 10/28/2011  . Pain in limb 05/15/2011    Past Surgical History:  Procedure Laterality Date  . COLONOSCOPY N/A 03/22/2015   Procedure: COLONOSCOPY;  Surgeon: West Bali, MD;  Location: [facility] ENDO SUITE;  Service: Endoscopy;  Laterality: N/A;  1215  . ESOPHAGOGASTRODUODENOSCOPY N/A 12/22/2014   Dr. Darrick Penna: 1. dysphagia due to mid-esophageal web and uncontrolled reflux esophagitis. 2. small hiatal hernia 3. multiple small gastric and duodenal ulcers and mild duodenitis . Path with reactive gastropathy, negative H. pylori   . ESOPHAGOGASTRODUODENOSCOPY N/A 03/22/2015   Procedure:  ESOPHAGOGASTRODUODENOSCOPY (EGD);  Surgeon: West Bali, MD;  Location: [facility] ENDO SUITE;  Service: Endoscopy;  Laterality: N/A;  . HERNIA REPAIR Left 2007  . INCISION AND DRAINAGE Right 12/18/2014   Procedure: INCISION AND DRAINAGE THUMB;  Surgeon: Vickki Hearing, MD;  Location: [facility] ORS;  Service: Orthopedics;  Laterality: Right;  . INCISION AND DRAINAGE Right 12/21/2014   Procedure: INCISION AND DRAINAGE RIGHT THUMB;  Surgeon: Vickki Hearing, MD;  Location: [facility] ORS;  Service: Orthopedics;  Laterality: Right;  . incision and drainage of thumb  12/18/14  . TOTAL HIP ARTHROPLASTY Right 09/18/2016  . TOTAL HIP ARTHROPLASTY Right 09/18/2016   Procedure: TOTAL HIP ARTHROPLASTY ANTERIOR APPROACH;  Surgeon: Samson Frederic, MD;  Location: [facility] OR;  Service: Orthopedics;  Laterality: Right;       Home Medications    Prior to Admission medications   Medication Sig Start Date End Date Taking? Authorizing Provider  aspirin 81 MG chewable tablet Chew 1 tablet (81 mg total) by mouth 2 (two) times daily. 09/19/16   Swinteck, Arlys John, MD  docusate sodium (COLACE) 100 MG capsule Take 1 capsule (100 mg total) by mouth 2 (two) times daily. 09/19/16   Swinteck, Arlys John, MD  naproxen sodium (ANAPROX) 220 MG tablet Take 440 mg by mouth every 4 (four) hours as needed (for pain.).    [provider]  ondansetron (ZOFRAN) 4 MG tablet Take 1 tablet (4 mg total) by mouth every 6 (  six) hours as needed for nausea. 09/19/16   Swinteck, Arlys John, MD  oxyCODONE (OXY IR/ROXICODONE) 5 MG immediate release tablet Take 1-2 tablets (5-10 mg total) by mouth every 3 (three) hours as needed for breakthrough pain. 09/19/16   Swinteck, Arlys John, MD  pantoprazole (PROTONIX) 40 MG tablet TAKE 1 TABLET DAILY 30 MINUTES PRIOR BREAKFAST DAILY. 12/19/16   Gelene Mink, NP  senna (SENOKOT) 8.6 MG TABS tablet Take 2 tablets (17.2 mg total) by mouth at bedtime. 09/19/16   Samson Frederic, MD    Family History Family History  Problem  Relation Age of Onset  . Cancer Mother   . Hyperlipidemia Mother   . Hypertension Mother   . Heart attack Mother   . Rheumatologic disease Mother   . Cancer Father   . Heart disease Father 35       Heart Disease before age 41  . Heart attack Father   . Diabetes Sister   . Cancer Sister   . Hyperlipidemia Sister   . Hypertension Sister   . Colon cancer Neg Hx     Social History Social History   Tobacco Use  . Smoking status: Former Smoker    Packs/day: 3.00    Years: 30.00    Pack years: 90.00    Types: Cigarettes    Last attempt to quit: 01/30/2005    Years since quitting: 12.0  . Smokeless tobacco: Never Used  Substance Use Topics  . Alcohol use: Yes    Comment: occasionally  . Drug use: No     Allergies   Codeine and Hydrocodone   Review of Systems Review of Systems  All other systems reviewed and are negative.    Physical Exam Updated Vital Signs BP 122/87 (BP Location: Right Arm)   Pulse 71   Temp 97.9 F (36.6 C) (Oral)   Resp 16   Ht 1.727 m (5\' 8" )   Wt 85.7 kg (189 lb)   SpO2 100%   BMI 28.74 kg/m   Physical Exam  Constitutional: He is oriented to person, place, and time. He appears well-developed and well-nourished.  HENT:  Head: Normocephalic and atraumatic.  Right Ear: External ear normal.  Left Ear: External ear normal.  Nose: Nose normal.  Mouth/Throat: Oropharynx is clear and moist.  Eyes: EOM are normal. Pupils are equal, round, and reactive to light.  Neck: Normal range of motion. Neck supple.  Cardiovascular: Normal rate, regular rhythm, normal heart sounds and intact distal pulses.  Pulmonary/Chest: Effort normal and breath sounds normal.  Abdominal: Soft. Bowel sounds are normal.  Musculoskeletal:  Left hip held flexed Tenderness over left hip No skin disruption No tenderness over knee Dorsal pedalis pulses intact Sensation intact in foot and leg.  Neurological: He is alert and oriented to person, place, and time. No  cranial nerve deficit. He exhibits normal muscle tone. Coordination normal.  Skin: Skin is warm and dry. Capillary refill takes less than 2 seconds.  Psychiatric: He has a normal mood and affect.  Nursing note and vitals reviewed.    ED Treatments / Results  Labs (all labs ordered are listed, but only abnormal results are displayed) Labs Reviewed  BASIC METABOLIC PANEL  CBC WITH DIFFERENTIAL/PLATELET  PROTIME-INR  TYPE AND SCREEN    EKG  EKG Interpretation None       Radiology Dg Chest 1 View  Result Date: 02/23/2017 CLINICAL DATA:  Left hip fracture. EXAM: CHEST 1 VIEW COMPARISON:  12/17/2014 FINDINGS: The heart size and mediastinal contours  are within normal limits. Both lungs are clear. The visualized skeletal structures are unremarkable. IMPRESSION: No active disease. Electronically Signed   By: Burman NievesWilliam  Stevens M.D.   On: 02/23/2017 21:28   Dg Femur Min 2 Views Left  Result Date: 02/23/2017 CLINICAL DATA:  Patient's leg gave out while walking. Left hip pain and decreased range of motion. EXAM: LEFT FEMUR 2 VIEWS COMPARISON:  None. FINDINGS: There is a transverse fracture of the left femoral neck with superior subluxation of the distal fracture fragment resulting in varus angulation. Fracture lines do not appear to extend to the inter trochanteric region. No dislocation of the hip joint. No definite evidence of an underlying bone lesion. Incidental note of a right hip arthroplasty, incompletely visualized. IMPRESSION: Acute transverse fracture of the left femoral neck with varus angulation. Electronically Signed   By: Burman NievesWilliam  Stevens M.D.   On: 02/23/2017 21:27    Procedures Procedures (including critical care time)  Medications Ordered in ED Medications  fentaNYL (SUBLIMAZE) injection 50 mcg (not administered)     Initial Impression / Assessment and Plan / ED Course  I have reviewed the triage vital signs and the nursing notes.  Pertinent labs & imaging results  that were available during my care of the patient were reviewed by me and considered in my medical decision making (see chart for details).    Left hip fracture occurred while walking Patient's orthopedist is [facility] Veda [facility]ingswintek [facility] Patient has been n.p.o. since 130 Labs are being ordered, IV placed, pain medicine being given. Test with Dr. Linna CapriceSwinteck. Marland Kitchen.  [facility].  [facility]  He advises patient to be admitted to hospitalist service. Discussed with Dr.Opyd and he will see patient Final Clinical Impressions(s) / ED Diagnoses   Final diagnoses:  Closed left hip fracture, initial encounter [facility](HCC)    ED Discharge Orders    None       Margarita Grizzleay, Jrue Jarriel, MD 02/23/17 2332

## 2017-02-23 NOTE — H&P (Signed)
History and Physical    Cory Roberts UJW:[id number] DOB: 1960/05/24 DOA: 02/23/2017  PCP: Avis Epley, PA-C   Patient coming from: Home  Chief Complaint: Severe left hip pain, unable to bear weight  HPI: Cory Roberts is a 57 y.o. male with medical history significant for history of CVA and avascular necrosis status post right total hip replacement, now presenting to the emergency department for evaluation of severe acute left hip pain with inability to bear weight.  Patient reports that he fell to the ground when a chair that he was sitting in weeks ago experienced some pain in the left hip at that time, but continued to ambulate without difficulty and even carried a 50 pound bag up 3 flights of stairs yesterday.  He was having a usual day at work stood up to walk, felt severe pain in the left hip and a "grinding" sensation.  He was unable to continue bearing weight due to the pain placed in a wheelchair.  He denies any recent fevers or chills eyes any history of chest pain or palpitations.  No recent illness.  ED Course: Upon arrival to the ED, patient is found to be afebrile, saturating well on room air, and with vitals otherwise stable.  EKG features a sinus rhythm with early R transition.  Chest x-ray is negative for acute cardiopulmonary disease.  Radiographs of the left hip demonstrate acute transverse fracture of the left femoral neck with varus angulation.  Radiographs of the pelvis and left knee are negative.  Chemistry panel revealed a creatinine of 1.29, slightly up from priors.  CBC is notable for leukocytosis to 13,600.  Patient was treated with IV fentanyl and morphine.  Surgery was consulted by the ED physician and admission to [facility] stable, and distress, and will be admitted to the medical-surgical unit at [facility] for ongoing evaluation and management of left femoral neck fracture.  Review of Systems:  All other systems reviewed and apart from HPI, are  negative.  Past Medical History:  Diagnosis Date  . Arthritis Feb. 2013   Gout- Right foot  . CVA (cerebral infarction)   . GERD (gastroesophageal reflux disease)   . Gout   . Headache    Migraine Headaches  . Raynaud's syndrome   . Stroke [facility]) 2011    Past Surgical History:  Procedure Laterality Date  . COLONOSCOPY N/A 03/22/2015   Procedure: COLONOSCOPY;  Surgeon: West Bali, MD;  [facility];  Service: Endoscopy;  Laterality: N/A;  1215  . ESOPHAGOGASTRODUODENOSCOPY N/A 12/22/2014   Dr. Darrick Penna: 1. dysphagia due to mid-esophageal web and uncontrolled reflux esophagitis. 2. small hiatal hernia 3. multiple small gastric and duodenal ulcers and mild duodenitis . Path with reactive gastropathy, negative H. pylori   . ESOPHAGOGASTRODUODENOSCOPY N/A 03/22/2015   Procedure: ESOPHAGOGASTRODUODENOSCOPY (EGD);  Surgeon: West Bali, MD;  Location[facility];  Service: Endoscopy;  Laterality: N/A;  . HERNIA REPAIR Left 2007  . INCISION AND DRAINAGE Right 12/18/2014   Procedure: INCISION AND DRAINAGE THUMB;  Surgeon: Vickki Hearing, MD;  Location: [facility] ORS;  Service: Orthopedics;  Laterality: Right;  . INCISION AND DRAINAGE Right 12/21/2014   Procedure: INCISION AND DRAINAGE RIGHT THUMB;  Surgeon: Vickki Hearing, MD;  Location: [facility] ORS;  Service: Orthopedics;  Laterality: Right;  . incision and drainage of thumb  12/18/14  . TOTAL HIP ARTHROPLASTY Right 09/18/2016  . TOTAL HIP ARTHROPLASTY Right 09/18/2016   Procedure: TOTAL HIP  ARTHROPLASTY ANTERIOR APPROACH;  Surgeon: Samson FredericSwinteck, Brian, MD;  [facility];  Service: Orthopedics;  Laterality: Right;     reports that he quit smoking about 12 years ago. His smoking use included cigarettes. He has a 90.00 pack-year smoking history. he has never used smokeless tobacco. He reports that he drinks alcohol. He reports that he does not use drugs.  Allergies  Allergen Reactions  . Codeine Nausea And Vomiting  .  Hydrocodone Nausea Only    Family History  Problem Relation Age of Onset  . Cancer Mother   . Hyperlipidemia Mother   . Hypertension Mother   . Heart attack Mother   . Rheumatologic disease Mother   . Cancer Father   . Heart disease Father [id number]       Heart Disease before age 59  . Heart attack Father   . Diabetes Sister   . Cancer Sister   . Hyperlipidemia Sister   . Hypertension Sister   . Colon cancer Neg Hx      Prior to Admission medications   Medication Sig Start Date End Date Taking? Authorizing Provider  aspirin 81 MG chewable tablet Chew 1 tablet (81 mg total) by mouth 2 (two) times daily. 09/19/16   Swinteck, Arlys JohnBrian, MD  docusate sodium (COLACE) 100 MG capsule Take 1 capsule (100 mg total) by mouth 2 (two) times daily. 09/19/16   Swinteck, Arlys JohnBrian, MD  naproxen sodium (ANAPROX) 220 MG tablet Take 440 mg by mouth every 4 (four) hours as needed (for pain.).    [provider]  ondansetron (ZOFRAN) 4 MG tablet Take 1 tablet (4 mg total) by mouth every 6 (six) hours as needed for nausea. 09/19/16   Swinteck, Arlys JohnBrian, MD  oxyCODONE (OXY IR/ROXICODONE) 5 MG immediate release tablet Take 1-2 tablets (5-10 mg total) by mouth every 3 (three) hours as needed for breakthrough pain. 09/19/16   Swinteck, Arlys JohnBrian, MD  pantoprazole (PROTONIX) 40 MG tablet TAKE 1 TABLET DAILY 30 MINUTES PRIOR BREAKFAST DAILY. 12/19/16   Gelene MinkBoone, Anna W, NP  senna (SENOKOT) 8.6 MG TABS tablet Take 2 tablets (17.2 mg total) by mouth at bedtime. 09/19/16   Samson FredericSwinteck, Brian, MD    Physical Exam: Vitals:   02/23/17 2044 02/23/17 2230 02/23/17 2300 02/23/17 2330  BP:  (!) 147/98 138/89 132/74  Pulse:  82 75 74  Resp:  16 11 12   Temp:      TempSrc:      SpO2:  100% 100% 99%  Weight: 85.7 kg (189 lb)     Height: 5\' 8"  (1.727 m)         Constitutional: NAD, calm  Eyes: PERTLA, lids and conjunctivae normal ENMT: Mucous membranes are moist. Posterior pharynx clear of any exudate or lesions.   Neck: normal,  supple, no masses, no thyromegaly Respiratory: clear to auscultation bilaterally, no wheezing, no crackles. Normal respiratory effort.   Cardiovascular: S1 & S2 heard, regular rate and rhythm. No extremity edema. 2+ pedal pulses. No significant JVD. Abdomen: No distension, no tenderness, no masses palpated. Bowel sounds normal.  Musculoskeletal: no clubbing / cyanosis. Exquisite tenderness to left hip, neurovascularly intact distally.  Skin: no significant rashes, lesions, ulcers. Warm, dry, well-perfused. Neurologic: CN 2-12 grossly intact. Sensation intact. Strength 5/5 in all 4 limbs.  Psychiatric: Alert and oriented x 3. Pleasant and cooperative.     Labs on Admission: I have personally reviewed following labs and imaging studies  CBC: Recent Labs  Lab 02/23/17 2201  WBC 13.6*  NEUTROABS  11.1*  HGB 14.2  HCT 43.8  MCV 92.6  PLT 204   Basic Metabolic Panel: Recent Labs  Lab 02/23/17 2201  NA 139  K 3.6  CL 105  CO2 24  GLUCOSE 110*  BUN 32*  CREATININE 1.29*  CALCIUM 9.3   GFR: Estimated Creatinine Clearance: 65.7 mL/min (A) (by C-G formula based on SCr of 1.29 mg/dL (H)). Liver Function Tests: No results for input(s): AST, ALT, ALKPHOS, BILITOT, PROT, ALBUMIN in the last 168 hours. No results for input(s): LIPASE, AMYLASE in the last 168 hours. No results for input(s): AMMONIA in the last 168 hours. Coagulation Profile: Recent Labs  Lab 02/23/17 2201  INR 0.99   Cardiac Enzymes: No results for input(s): CKTOTAL, CKMB, CKMBINDEX, TROPONINI in the last 168 hours. BNP (last 3 results) No results for input(s): PROBNP in the last 8760 hours. HbA1C: No results for input(s): HGBA1C in the last 72 hours. CBG: No results for input(s): GLUCAP in the last 168 hours. Lipid Profile: No results for input(s): CHOL, HDL, LDLCALC, TRIG, CHOLHDL, LDLDIRECT in the last 72 hours. Thyroid Function Tests: No results for input(s): TSH, T4TOTAL, FREET4, T3FREE, THYROIDAB in  the last 72 hours. Anemia Panel: No results for input(s): VITAMINB12, FOLATE, FERRITIN, TIBC, IRON, RETICCTPCT in the last 72 hours. Urine analysis: No results found for: COLORURINE, APPEARANCEUR, LABSPEC, PHURINE, GLUCOSEU, HGBUR, BILIRUBINUR, KETONESUR, PROTEINUR, UROBILINOGEN, NITRITE, LEUKOCYTESUR Sepsis Labs: @LABRCNTIP (procalcitonin:4,lacticidven:4) )No results found for this or any previous visit (from the past 240 hour(s)).   Radiological Exams on Admission: Dg Chest 1 View  Result Date: 02/23/2017 CLINICAL DATA:  Left hip fracture. EXAM: CHEST 1 VIEW COMPARISON:  12/17/2014 FINDINGS: The heart size and mediastinal contours are within normal limits. Both lungs are clear. The visualized skeletal structures are unremarkable. IMPRESSION: No active disease. Electronically Signed   By: Burman Nieves M.D.   On: 02/23/2017 21:28   Dg Pelvis Portable  Result Date: 02/23/2017 CLINICAL DATA:  Left hip pain EXAM: PORTABLE PELVIS 1-2 VIEWS COMPARISON:  Femur radiograph 02/23/2017 FINDINGS: Status post right hip replacement with normal alignment. Pubic symphysis and rami are intact. Left femoral neck fracture again noted. IMPRESSION: 1. Status post right hip replacement with normal alignment 2. Acute left femoral neck fracture. Electronically Signed   By: Jasmine Pang M.D.   On: 02/23/2017 23:15   Dg Knee Left Port  Result Date: 02/23/2017 CLINICAL DATA:  Lambert Mody left hip pain EXAM: PORTABLE LEFT KNEE - 1-2 VIEW COMPARISON:  02/23/2017 FINDINGS: No fracture or malalignment. Minimal patellar spurring. No large knee effusion IMPRESSION: No acute osseous abnormality. Electronically Signed   By: Jasmine Pang M.D.   On: 02/23/2017 23:15   Dg Femur Min 2 Views Left  Result Date: 02/23/2017 CLINICAL DATA:  Patient's leg gave out while walking. Left hip pain and decreased range of motion. EXAM: LEFT FEMUR 2 VIEWS COMPARISON:  None. FINDINGS: There is a transverse fracture of the left femoral neck with  superior subluxation of the distal fracture fragment resulting in varus angulation. Fracture lines do not appear to extend to the inter trochanteric region. No dislocation of the hip joint. No definite evidence of an underlying bone lesion. Incidental note of a right hip arthroplasty, incompletely visualized. IMPRESSION: Acute transverse fracture of the left femoral neck with varus angulation. Electronically Signed   By: Burman Nieves M.D.   On: 02/23/2017 21:27    EKG: Independently reviewed. Sinus rhythm, early R-transition.   Assessment/Plan  1. Left hip fracture  - Presents  with severe left hip pain and inability to bear weight  - Radiographs reveal acute fracture of left femoral neck  - Orthopedic surgery is consulting, requesting medical admission to [facility] for likely operative repair  - Based on the available data, Mr. Richart presents an estimated risk probability of 0.2% for perioperative MI or cardiac arrest  - Continue supportive care with gentle IVF hydration, prn analgesia   2. Mild renal insufficiency  - SCr is 1.29 on admission, slightly higher than priors  - Hydrate with NS infusion, avoid nephrotoxins, repeat chem panel in am   3. Hx of CVA  - No acute deficit  - ASA held for upcoming surgery     DVT prophylaxis: SCD's Code Status: Full  Family Communication: Discussed with patient Disposition Plan: Admit to med-surg at [facility] Consults called: Orthopedic surgery Admission status: Inpatient    Briscoe Deutscher, MD [facility] Pager [phone]  If 7PM-7AM, please contact night-coverage [email or web address] Password [facility]  02/23/2017, 11:38 PM

## 2017-02-23 NOTE — Progress Notes (Signed)
Ortho  Consult received. Patient is at [facility] with displaced  L femoral neck fx. Known to me for previous R THA. Recommend  hospitalist admit, [facility]. Full consult note to follow.

## 2017-02-24 ENCOUNTER — Encounter (HOSPITAL_COMMUNITY): Payer: Self-pay

## 2017-02-24 LAB — TYPE AND SCREEN
ABO/RH(D): A POS
ANTIBODY SCREEN: NEGATIVE

## 2017-02-24 LAB — GLUCOSE, CAPILLARY
GLUCOSE-CAPILLARY: 122 mg/dL — AB (ref 65–99)
GLUCOSE-CAPILLARY: 102 mg/dL — AB (ref 65–99)

## 2017-02-24 MED ORDER — OXYCODONE HCL 5 MG PO TABS
5.0000 mg | ORAL_TABLET | ORAL | Status: DC | PRN
  Administered 2017-02-24 – 2017-02-26 (×8): 10 mg via ORAL
  Administered 2017-02-27: 5 mg via ORAL
  Filled 2017-02-24 (×9): qty 2

## 2017-02-24 MED ORDER — CEFAZOLIN SODIUM-DEXTROSE 2-4 GM/100ML-% IV SOLN
2.0000 g | INTRAVENOUS | Status: AC
  Administered 2017-02-26: 2 g via INTRAVENOUS
  Filled 2017-02-24: qty 100

## 2017-02-24 MED ORDER — MORPHINE SULFATE (PF) 4 MG/ML IV SOLN: 4.0000 mg | INTRAVENOUS | Status: DC | PRN

## 2017-02-24 NOTE — Progress Notes (Signed)
Nutrition Brief Note Nutrition consult received for hip fracture protocol.  Pt with fell a few weeks ago now admitted with hip pain and plans for surgery on Monday.  No recent weight loss, good appetite PTA and here.   Wt Readings from Last 15 Encounters:  02/24/17 188 lb 7.9 oz (85.5 kg)  09/18/16 184 lb 9.6 oz (83.7 kg)  09/11/16 184 lb 9.6 oz (83.7 kg)  03/22/15 188 lb (85.3 kg)  03/08/15 188 lb 12.8 oz (85.6 kg)  01/28/15 180 lb (81.6 kg)  01/07/15 180 lb (81.6 kg)  12/31/14 180 lb (81.6 kg)  12/21/14 180 lb (81.6 kg)  12/13/14 174 lb (78.9 kg)  10/28/11 190 lb (86.2 kg)  05/15/11 190 lb 12.8 oz (86.5 kg)    Body mass index is 28.66 kg/m. Patient meets criteria for overweight based on current BMI.   Current diet order is Regular, patient is consuming approximately 100% of meals at this time. Labs and medications reviewed.   No nutrition interventions warranted at this time. If nutrition issues arise, please consult RD.   Kendell BaneHeather Kaleiyah Polsky RD, LDN, CNSC [phone] Pager [phone] After Hours Pager

## 2017-02-24 NOTE — Consult Note (Signed)
Reason for Consult:L hip fx Referring Physician: Dr Jeanell Sparrow EDP  Cory Roberts is an 57 y.o. male.  HPI: Cory Roberts is an established patient of Dr. Sid Falcon s/p R THA 08/2016. Reports a few weeks ago he fell backward out of a chair onto a concrete floor landing on his buttocks but did not start experiencing any pain at the time. Left hip pain started about a week ago with no other injury or change in activity. He was walking across the facility at work yesterday when suddenly he started experiencing severe pain and could not continue. Was nauseous and dry heaving afterward. Presented to ER where he was found to have an acute L fem neck fx. He has been transferred to [facility] for definitive tx. He reports hx of AVN.  Past Medical History:  Diagnosis Date  . Arthritis Feb. 2013   Gout- Right foot  . CVA (cerebral infarction)   . GERD (gastroesophageal reflux disease)   . Gout   . Headache    Migraine Headaches  . Raynaud's syndrome   . Stroke [facility]) 2011    Past Surgical History:  Procedure Laterality Date  . COLONOSCOPY N/A 03/22/2015   Procedure: COLONOSCOPY;  Surgeon: Danie Binder, MD;  Location[facility];  Service: Endoscopy;  Laterality: N/A;  1215  . ESOPHAGOGASTRODUODENOSCOPY N/A 12/22/2014   Dr. Oneida Alar: 1. dysphagia due to mid-esophageal web and uncontrolled reflux esophagitis. 2. small hiatal hernia 3. multiple small gastric and duodenal ulcers and mild duodenitis . Path with reactive gastropathy, negative H. pylori   . ESOPHAGOGASTRODUODENOSCOPY N/A 03/22/2015   Procedure: ESOPHAGOGASTRODUODENOSCOPY (EGD);  Surgeon: Danie Binder, MD;  Location: [facility];  Service: Endoscopy;  Laterality: N/A;  . HERNIA REPAIR Left 2007  . INCISION AND DRAINAGE Right 12/18/2014   Procedure: INCISION AND DRAINAGE THUMB;  Surgeon: Carole Civil, MD;  Location: [facility] ORS;  Service: Orthopedics;  Laterality: Right;  . INCISION AND DRAINAGE Right 12/21/2014   Procedure: INCISION AND DRAINAGE RIGHT  THUMB;  Surgeon: Carole Civil, MD;  Location: [facility] ORS;  Service: Orthopedics;  Laterality: Right;  . incision and drainage of thumb  12/18/14  . TOTAL HIP ARTHROPLASTY Right 09/18/2016  . TOTAL HIP ARTHROPLASTY Right 09/18/2016   Procedure: TOTAL HIP ARTHROPLASTY ANTERIOR APPROACH;  Surgeon: Rod Can, MD;  Location: [place];  Service: Orthopedics;  Laterality: Right;    Family History  Problem Relation Age of Onset  . Cancer Mother   . Hyperlipidemia Mother   . Hypertension Mother   . Heart attack Mother   . Rheumatologic disease Mother   . Cancer Father   . Heart disease Father 72       Heart Disease before age 22  . Heart attack Father   . Diabetes Sister   . Cancer Sister   . Hyperlipidemia Sister   . Hypertension Sister   . Colon cancer Neg Hx     Social History:  reports that he quit smoking about 12 years ago. His smoking use included cigarettes. He has a 90.00 pack-year smoking history. he has never used smokeless tobacco. He reports that he drinks alcohol. He reports that he does not use drugs.  Allergies:  Allergies  Allergen Reactions  . Codeine Nausea And Vomiting  . Hydrocodone Nausea Only    Medications: I have reviewed the patient's current medications.  Results for orders placed or performed during the hospital encounter of 02/23/17 (from the past 48 hour(s))  Basic metabolic panel  Status: Abnormal   Collection Time: 02/23/17 10:01 PM  Result Value Ref Range   Sodium 139 135 - 145 mmol/L   Potassium 3.6 3.5 - 5.1 mmol/L   Chloride 105 101 - 111 mmol/L   CO2 24 22 - 32 mmol/L   Glucose, Bld 110 (H) 65 - 99 mg/dL   BUN 32 (H) 6 - 20 mg/dL   Creatinine, Ser 1.29 (H) 0.61 - 1.24 mg/dL   Calcium 9.3 8.9 - 10.3 mg/dL   GFR calc non Af Amer 59 (L) >60 mL/min   GFR calc Af Amer >60 >60 mL/min    Comment: (NOTE) The eGFR has been calculated using the CKD EPI equation. This calculation has not been validated in all clinical situations. eGFR's  persistently <60 mL/min signify possible Chronic Kidney Disease.    Anion gap 10 5 - 15  CBC WITH DIFFERENTIAL     Status: Abnormal   Collection Time: 02/23/17 10:01 PM  Result Value Ref Range   WBC 13.6 (H) 4.0 - 10.5 K/uL   RBC 4.73 4.22 - 5.81 MIL/uL   Hemoglobin 14.2 13.0 - 17.0 g/dL   HCT 43.8 39.0 - 52.0 %   MCV 92.6 78.0 - 100.0 fL   MCH 30.0 26.0 - 34.0 pg   MCHC 32.4 30.0 - 36.0 g/dL   RDW 14.2 11.5 - 15.5 %   Platelets 204 150 - 400 K/uL   Neutrophils Relative % 81 %   Neutro Abs 11.1 (H) 1.7 - 7.7 K/uL   Lymphocytes Relative 12 %   Lymphs Abs 1.6 0.7 - 4.0 K/uL   Monocytes Relative 6 %   Monocytes Absolute 0.8 0.1 - 1.0 K/uL   Eosinophils Relative 1 %   Eosinophils Absolute 0.1 0.0 - 0.7 K/uL   Basophils Relative 0 %   Basophils Absolute 0.0 0.0 - 0.1 K/uL  Protime-INR     Status: None   Collection Time: 02/23/17 10:01 PM  Result Value Ref Range   Prothrombin Time 13.0 11.4 - 15.2 seconds   INR 0.99   Type and screen [facility]     Status: None   Collection Time: 02/23/17 10:07 PM  Result Value Ref Range   ABO/RH(D) A POS    Antibody Screen NEG    Sample Expiration 02/26/2017     Dg Chest 1 View  Result Date: 02/23/2017 CLINICAL DATA:  Left hip fracture. EXAM: CHEST 1 VIEW COMPARISON:  12/17/2014 FINDINGS: The heart size and mediastinal contours are within normal limits. Both lungs are clear. The visualized skeletal structures are unremarkable. IMPRESSION: No active disease. Electronically Signed   By: Lucienne Capers M.D.   On: 02/23/2017 21:28   Dg Pelvis Portable  Result Date: 02/23/2017 CLINICAL DATA:  Left hip pain EXAM: PORTABLE PELVIS 1-2 VIEWS COMPARISON:  Femur radiograph 02/23/2017 FINDINGS: Status post right hip replacement with normal alignment. Pubic symphysis and rami are intact. Left femoral neck fracture again noted. IMPRESSION: 1. Status post right hip replacement with normal alignment 2. Acute left femoral neck fracture.  Electronically Signed   By: Donavan Foil M.D.   On: 02/23/2017 23:15   Dg Knee Left Port  Result Date: 02/23/2017 CLINICAL DATA:  Hervey Ard left hip pain EXAM: PORTABLE LEFT KNEE - 1-2 VIEW COMPARISON:  02/23/2017 FINDINGS: No fracture or malalignment. Minimal patellar spurring. No large knee effusion IMPRESSION: No acute osseous abnormality. Electronically Signed   By: Donavan Foil M.D.   On: 02/23/2017 23:15   Dg Femur Min 2  Views Left  Result Date: 02/23/2017 CLINICAL DATA:  Patient's leg gave out while walking. Left hip pain and decreased range of motion. EXAM: LEFT FEMUR 2 VIEWS COMPARISON:  None. FINDINGS: There is a transverse fracture of the left femoral neck with superior subluxation of the distal fracture fragment resulting in varus angulation. Fracture lines do not appear to extend to the inter trochanteric region. No dislocation of the hip joint. No definite evidence of an underlying bone lesion. Incidental note of a right hip arthroplasty, incompletely visualized. IMPRESSION: Acute transverse fracture of the left femoral neck with varus angulation. Electronically Signed   By: Lucienne Capers M.D.   On: 02/23/2017 21:27    Review of Systems  Constitutional: Negative.   HENT: Negative.   Eyes: Negative.   Respiratory: Negative.   Cardiovascular: Negative.   Gastrointestinal: Negative.   Genitourinary: Negative.   Musculoskeletal: Positive for joint pain.  Skin: Negative.   Neurological: Negative.   Psychiatric/Behavioral: Negative.    Blood pressure 121/65, pulse 68, temperature 97.8 F (36.6 C), temperature source Oral, resp. rate 17, height _0  (1.727 m), weight 85.5 kg (188 lb 7.9 oz), SpO2 99 %. Physical Exam  Constitutional: He is oriented to person, place, and time. He appears well-developed.  HENT:  Head: Normocephalic.  Eyes: Pupils are equal, round, and reactive to light.  Neck: Normal range of motion.  Cardiovascular: Normal rate.  Respiratory: Effort normal.   GI: Soft.  Musculoskeletal:  L leg shortened and externally rotated Pain in L groin with rotation of the leg NVI distally Pedal pulses 2+ Sensation intact distally No calf pain or sign of DVT  Neurological: He is alert and oriented to person, place, and time.  Skin: Skin is warm and dry.    Assessment/Plan: L displaced femoral neck fx  Admitted to hospitalist service Will require surgical fixation Posted for L total hip replacement by Dr. Lyla Glassing Monday 1/28 NPO p MN Sunday for surgery Monday Discussed plan with pt and he is in agreement to proceed Pain control in the interim Hold DVT ppx for surgery accordingly, TEDs and SCDs ordered  BISSELL, JACLYN M. 02/24/2017, 8:50 AM

## 2017-02-24 NOTE — Progress Notes (Signed)
PROGRESS NOTE  Cory Roberts [id number] DOB: 26-May-1960 DOA: 02/23/2017 PCP: Avis EpleyJackson, Samantha J, PA-C   LOS: 1 day   Brief Narrative / Interim history: 57-year-old male with history of prior CVA without any residual weakness, right hip avascular necrosis status post right total hip replacement in August 2018, presents to the hospital with acute excruciating pain on the left side, patient denies any falls and is not sure how that happened however was found to have left hip fracture, and orthopedic surgery was consulted.  Assessment & Plan: Principal Problem:   Closed left hip fracture, initial encounter (HCC) Active Problems:   History of CVA in adulthood   Mild renal insufficiency  Left hip fracture -X-rays with acute fracture of the left femoral neck, orthopedic surgery consulted, appreciate input -He is low risk for surgery -Continue IV fluids, analgesia, DVT prophylaxis postop per surgery  CVA -No residual deficits, aspirin on hold  Slight creatinine elevation -Stable, repeat after fluids  DVT prophylaxis: SCDs Code Status: Full code Family Communication: Family at bedside Disposition Plan: Home versus SNF postop, plan for surgery on Monday  Consultants:   Orthopedic surgery  Procedures:   None   Antimicrobials:  None    Subjective: - no chest pain, shortness of breath, no abdominal pain, nausea or vomiting.   Objective: Vitals:   02/24/17 0000 02/24/17 0156 02/24/17 0621 02/24/17 0759  BP:  129/86 124/76 121/65  Pulse: 74 71 64 68  Resp: 12 18 17 18   Temp:  97.9 F (36.6 C) 97.8 F (36.6 C) 97.8 F (36.6 C)  TempSrc:  Oral Oral Oral  SpO2: 100% 99% 99% 99%  Weight:  85.5 kg (188 lb 7.9 oz)    Height:  5\' 8"  (1.727 m)      Intake/Output Summary (Last 24 hours) at 02/24/2017 1129 Last data filed at 02/24/2017 0900 Gross per 24 hour  Intake 1000 ml  Output 1175 ml  Net -175 ml   Filed Weights   02/23/17 2044 02/24/17 0156  Weight: 85.7 kg (189  lb) 85.5 kg (188 lb 7.9 oz)    Examination:  Constitutional: NAD Eyes: lids and conjunctivae normal ENMT: Mucous membranes are moist.  Respiratory: clear to auscultation bilaterally, no wheezing, no crackles. Normal respiratory effort. Cardiovascular: Regular rate and rhythm, no murmurs / rubs / gallops. No LE edema. 2+ pedal pulses.  Abdomen: no tenderness.  Musculoskeletal: no clubbing / cyanosis. Skin: no rashes Neurologic: CN 2-12 grossly intact. Strength 5/5 in all 4.  Psychiatric: Normal judgment and insight. Alert and oriented x 3. Normal mood.    Data Reviewed: I have independently reviewed following labs and imaging studies   CBC: Recent Labs  Lab 02/23/17 2201  WBC 13.6*  NEUTROABS 11.1*  HGB 14.2  HCT 43.8  MCV 92.6  PLT 204   Basic Metabolic Panel: Recent Labs  Lab 02/23/17 2201  NA 139  K 3.6  CL 105  CO2 24  GLUCOSE 110*  BUN 32*  CREATININE 1.29*  CALCIUM 9.3   GFR: Estimated Creatinine Clearance: 65.6 mL/min (A) (by C-G formula based on SCr of 1.29 mg/dL (H)). Liver Function Tests: No results for input(s): AST, ALT, ALKPHOS, BILITOT, PROT, ALBUMIN in the last 168 hours. No results for input(s): LIPASE, AMYLASE in the last 168 hours. No results for input(s): AMMONIA in the last 168 hours. Coagulation Profile: Recent Labs  Lab 02/23/17 2201  INR 0.99   Cardiac Enzymes: No results for input(s): CKTOTAL, CKMB, CKMBINDEX, TROPONINI in the  last 168 hours. BNP (last 3 results) No results for input(s): PROBNP in the last 8760 hours. HbA1C: No results for input(s): HGBA1C in the last 72 hours. CBG: Recent Labs  Lab 02/24/17 1115  GLUCAP 122*   Lipid Profile: No results for input(s): CHOL, HDL, LDLCALC, TRIG, CHOLHDL, LDLDIRECT in the last 72 hours. Thyroid Function Tests: No results for input(s): TSH, T4TOTAL, FREET4, T3FREE, THYROIDAB in the last 72 hours. Anemia Panel: No results for input(s): VITAMINB12, FOLATE, FERRITIN, TIBC, IRON,  RETICCTPCT in the last 72 hours. Urine analysis: No results found for: COLORURINE, APPEARANCEUR, LABSPEC, PHURINE, GLUCOSEU, HGBUR, BILIRUBINUR, KETONESUR, PROTEINUR, UROBILINOGEN, NITRITE, LEUKOCYTESUR Sepsis Labs: Invalid input(s): PROCALCITONIN, LACTICIDVEN  No results found for this or any previous visit (from the past 240 hour(s)).    Radiology Studies: Dg Chest 1 View  Result Date: 02/23/2017 CLINICAL DATA:  Left hip fracture. EXAM: CHEST 1 VIEW COMPARISON:  12/17/2014 FINDINGS: The heart size and mediastinal contours are within normal limits. Both lungs are clear. The visualized skeletal structures are unremarkable. IMPRESSION: No active disease. Electronically Signed   By: Burman Nieves M.D.   On: 02/23/2017 21:28   Dg Pelvis Portable  Result Date: 02/23/2017 CLINICAL DATA:  Left hip pain EXAM: PORTABLE PELVIS 1-2 VIEWS COMPARISON:  Femur radiograph 02/23/2017 FINDINGS: Status post right hip replacement with normal alignment. Pubic symphysis and rami are intact. Left femoral neck fracture again noted. IMPRESSION: 1. Status post right hip replacement with normal alignment 2. Acute left femoral neck fracture. Electronically Signed   By: Jasmine Pang M.D.   On: 02/23/2017 23:15   Dg Knee Left Port  Result Date: 02/23/2017 CLINICAL DATA:  Lambert Mody left hip pain EXAM: PORTABLE LEFT KNEE - 1-2 VIEW COMPARISON:  02/23/2017 FINDINGS: No fracture or malalignment. Minimal patellar spurring. No large knee effusion IMPRESSION: No acute osseous abnormality. Electronically Signed   By: Jasmine Pang M.D.   On: 02/23/2017 23:15   Dg Femur Min 2 Views Left  Result Date: 02/23/2017 CLINICAL DATA:  Patient's leg gave out while walking. Left hip pain and decreased range of motion. EXAM: LEFT FEMUR 2 VIEWS COMPARISON:  None. FINDINGS: There is a transverse fracture of the left femoral neck with superior subluxation of the distal fracture fragment resulting in varus angulation. Fracture lines do not  appear to extend to the inter trochanteric region. No dislocation of the hip joint. No definite evidence of an underlying bone lesion. Incidental note of a right hip arthroplasty, incompletely visualized. IMPRESSION: Acute transverse fracture of the left femoral neck with varus angulation. Electronically Signed   By: Burman Nieves M.D.   On: 02/23/2017 21:27     Scheduled Meds: . docusate sodium  100 mg Oral BID  . pantoprazole  40 mg Oral Daily  . senna  2 tablet Oral QHS   Continuous Infusions: . [START ON 02/26/2017]  ceFAZolin (ANCEF) IV    . methocarbamol (ROBAXIN)  IV       Pamella Pert, MD, PhD [facility] Pager [phone]  If 7PM-7AM, please contact night-coverage [email or web address] Password [facility] 02/24/2017, 11:29 AM

## 2017-02-24 NOTE — H&P (View-Only) (Signed)
Reason for Consult:L hip fx Referring Physician: Dr Jeanell Sparrow EDP  Cory Roberts is an 57 y.o. male.  HPI: Cory Roberts is an established patient of Dr. Sid Falcon s/p R [facility] 08/2016. Reports a few weeks ago he fell backward out of a chair onto a concrete floor landing on his buttocks but did not start experiencing any pain at the time. Left hip pain started about a week ago with no other injury or change in activity. He was walking across the facility at work yesterday when suddenly he started experiencing severe pain and could not continue. Was nauseous and dry heaving afterward. Presented to ER where he was found to have an acute L fem neck fx. He has been transferred to [facility] for definitive tx. He reports hx of AVN.  Past Medical History:  Diagnosis Date  . Arthritis Feb. 2013   Gout- Right foot  . CVA (cerebral infarction)   . GERD (gastroesophageal reflux disease)   . Gout   . Headache    Migraine Headaches  . Raynaud's syndrome   . Stroke [facility]) 2011    Past Surgical History:  Procedure Laterality Date  . COLONOSCOPY N/A 03/22/2015   Procedure: COLONOSCOPY;  Surgeon: Danie Binder, MD;  Location: [facility];  Service: Endoscopy;  Laterality: N/A;  1215  . ESOPHAGOGASTRODUODENOSCOPY N/A 12/22/2014   Dr. Oneida Alar: 1. dysphagia due to mid-esophageal web and uncontrolled reflux esophagitis. 2. small hiatal hernia 3. multiple small gastric and duodenal ulcers and mild duodenitis . Path with reactive gastropathy, negative H. pylori   . ESOPHAGOGASTRODUODENOSCOPY N/A 03/22/2015   Procedure: ESOPHAGOGASTRODUODENOSCOPY (EGD);  Surgeon: Danie Binder, MD;  Location: [facility];  Service: Endoscopy;  Laterality: N/A;  . HERNIA REPAIR Left 2007  . INCISION AND DRAINAGE Right 12/18/2014   Procedure: INCISION AND DRAINAGE THUMB;  Surgeon: Carole Civil, MD;  Location: [facility] ORS;  Service: Orthopedics;  Laterality: Right;  . INCISION AND DRAINAGE Right 12/21/2014   Procedure: INCISION AND DRAINAGE RIGHT  THUMB;  Surgeon: Carole Civil, MD;  Location: [facility] ORS;  Service: Orthopedics;  Laterality: Right;  . incision and drainage of thumb  12/18/14  . TOTAL HIP ARTHROPLASTY Right 09/18/2016  . TOTAL HIP ARTHROPLASTY Right 09/18/2016   Procedure: TOTAL HIP ARTHROPLASTY ANTERIOR APPROACH;  Surgeon: Rod Can, MD;  Location: [place];  Service: Orthopedics;  Laterality: Right;    Family History  Problem Relation Age of Onset  . Cancer Mother   . Hyperlipidemia Mother   . Hypertension Mother   . Heart attack Mother   . Rheumatologic disease Mother   . Cancer Father   . Heart disease Father 72       Heart Disease before age 22  . Heart attack Father   . Diabetes Sister   . Cancer Sister   . Hyperlipidemia Sister   . Hypertension Sister   . Colon cancer Neg Hx     Social History:  reports that he quit smoking about 12 years ago. His smoking use included cigarettes. He has a 90.00 pack-year smoking history. he has never used smokeless tobacco. He reports that he drinks alcohol. He reports that he does not use drugs.  Allergies:  Allergies  Allergen Reactions  . Codeine Nausea And Vomiting  . Hydrocodone Nausea Only    Medications: I have reviewed the patient's current medications.  Results for orders placed or performed during the hospital encounter of 02/23/17 (from the past 48 hour(s))  Basic metabolic panel  Status: Abnormal   Collection Time: 02/23/17 10:01 PM  Result Value Ref Range   Sodium 139 135 - 145 mmol/L   Potassium 3.6 3.5 - 5.1 mmol/L   Chloride 105 101 - 111 mmol/L   CO2 24 22 - 32 mmol/L   Glucose, Bld 110 (H) 65 - 99 mg/dL   BUN 32 (H) 6 - 20 mg/dL   Creatinine, Ser 1.29 (H) 0.61 - 1.24 mg/dL   Calcium 9.3 8.9 - 10.3 mg/dL   GFR calc non Af Amer 59 (L) >60 mL/min   GFR calc Af Amer >60 >60 mL/min    Comment: (NOTE) The eGFR has been calculated using the CKD EPI equation. This calculation has not been validated in all clinical situations. eGFR's  persistently <60 mL/min signify possible Chronic Kidney Disease.    Anion gap 10 5 - 15  CBC WITH DIFFERENTIAL     Status: Abnormal   Collection Time: 02/23/17 10:01 PM  Result Value Ref Range   WBC 13.6 (H) 4.0 - 10.5 K/uL   RBC 4.73 4.22 - 5.81 MIL/uL   Hemoglobin 14.2 13.0 - 17.0 g/dL   HCT 43.8 39.0 - 52.0 %   MCV 92.6 78.0 - 100.0 fL   MCH 30.0 26.0 - 34.0 pg   MCHC 32.4 30.0 - 36.0 g/dL   RDW 14.2 11.5 - 15.5 %   Platelets 204 150 - 400 K/uL   Neutrophils Relative % 81 %   Neutro Abs 11.1 (H) 1.7 - 7.7 K/uL   Lymphocytes Relative 12 %   Lymphs Abs 1.6 0.7 - 4.0 K/uL   Monocytes Relative 6 %   Monocytes Absolute 0.8 0.1 - 1.0 K/uL   Eosinophils Relative 1 %   Eosinophils Absolute 0.1 0.0 - 0.7 K/uL   Basophils Relative 0 %   Basophils Absolute 0.0 0.0 - 0.1 K/uL  Protime-INR     Status: None   Collection Time: 02/23/17 10:01 PM  Result Value Ref Range   Prothrombin Time 13.0 11.4 - 15.2 seconds   INR 0.99   Type and screen [facility]     Status: None   Collection Time: 02/23/17 10:07 PM  Result Value Ref Range   ABO/RH(D) A POS    Antibody Screen NEG    Sample Expiration 02/26/2017     Dg Chest 1 View  Result Date: 02/23/2017 CLINICAL DATA:  Left hip fracture. EXAM: CHEST 1 VIEW COMPARISON:  12/17/2014 FINDINGS: The heart size and mediastinal contours are within normal limits. Both lungs are clear. The visualized skeletal structures are unremarkable. IMPRESSION: No active disease. Electronically Signed   By: Lucienne Capers M.D.   On: 02/23/2017 21:28   Dg Pelvis Portable  Result Date: 02/23/2017 CLINICAL DATA:  Left hip pain EXAM: PORTABLE PELVIS 1-2 VIEWS COMPARISON:  Femur radiograph 02/23/2017 FINDINGS: Status post right hip replacement with normal alignment. Pubic symphysis and rami are intact. Left femoral neck fracture again noted. IMPRESSION: 1. Status post right hip replacement with normal alignment 2. Acute left femoral neck fracture.  Electronically Signed   By: Donavan Foil M.D.   On: 02/23/2017 23:15   Dg Knee Left Port  Result Date: 02/23/2017 CLINICAL DATA:  Hervey Ard left hip pain EXAM: PORTABLE LEFT KNEE - 1-2 VIEW COMPARISON:  02/23/2017 FINDINGS: No fracture or malalignment. Minimal patellar spurring. No large knee effusion IMPRESSION: No acute osseous abnormality. Electronically Signed   By: Donavan Foil M.D.   On: 02/23/2017 23:15   Dg Femur Min 2  Views Left  Result Date: 02/23/2017 CLINICAL DATA:  Patient's leg gave out while walking. Left hip pain and decreased range of motion. EXAM: LEFT FEMUR 2 VIEWS COMPARISON:  None. FINDINGS: There is a transverse fracture of the left femoral neck with superior subluxation of the distal fracture fragment resulting in varus angulation. Fracture lines do not appear to extend to the inter trochanteric region. No dislocation of the hip joint. No definite evidence of an underlying bone lesion. Incidental note of a right hip arthroplasty, incompletely visualized. IMPRESSION: Acute transverse fracture of the left femoral neck with varus angulation. Electronically Signed   By: Lucienne Capers M.D.   On: 02/23/2017 21:27    Review of Systems  Constitutional: Negative.   HENT: Negative.   Eyes: Negative.   Respiratory: Negative.   Cardiovascular: Negative.   Gastrointestinal: Negative.   Genitourinary: Negative.   Musculoskeletal: Positive for joint pain.  Skin: Negative.   Neurological: Negative.   Psychiatric/Behavioral: Negative.    Blood pressure 121/65, pulse 68, temperature 97.8 F (36.6 C), temperature source Oral, resp. rate 17, height _0  (1.727 m), weight 85.5 kg (188 lb 7.9 oz), SpO2 99 %. Physical Exam  Constitutional: He is oriented to person, place, and time. He appears well-developed.  HENT:  Head: Normocephalic.  Eyes: Pupils are equal, round, and reactive to light.  Neck: Normal range of motion.  Cardiovascular: Normal rate.  Respiratory: Effort normal.   GI: Soft.  Musculoskeletal:  L leg shortened and externally rotated Pain in L groin with rotation of the leg NVI distally Pedal pulses 2+ Sensation intact distally No calf pain or sign of DVT  Neurological: He is alert and oriented to person, place, and time.  Skin: Skin is warm and dry.    Assessment/Plan: L displaced femoral neck fx  Admitted to hospitalist service Will require surgical fixation Posted for L total hip replacement by Dr. Lyla Glassing Monday 1/28 NPO p MN Sunday for surgery Monday Discussed plan with pt and he is in agreement to proceed Pain control in the interim Hold DVT ppx for surgery accordingly, TEDs and SCDs ordered  Audree Schrecengost M. 02/24/2017, 8:50 AM

## 2017-02-25 ENCOUNTER — Encounter (HOSPITAL_COMMUNITY): Payer: Self-pay | Admitting: *Deleted

## 2017-02-25 LAB — CBC
HCT: 41.4 % (ref 39.0–52.0)
Hemoglobin: 13.5 g/dL (ref 13.0–17.0)
MCH: 30.4 pg (ref 26.0–34.0)
MCHC: 32.6 g/dL (ref 30.0–36.0)
MCV: 93.2 fL (ref 78.0–100.0)
PLATELETS: 174 10*3/uL (ref 150–400)
RBC: 4.44 MIL/uL (ref 4.22–5.81)
RDW: 14.5 % (ref 11.5–15.5)
WBC: 6.9 10*3/uL (ref 4.0–10.5)

## 2017-02-25 LAB — BASIC METABOLIC PANEL
Anion gap: 10 (ref 5–15)
BUN: 18 mg/dL (ref 6–20)
CALCIUM: 8.9 mg/dL (ref 8.9–10.3)
CO2: 25 mmol/L (ref 22–32)
CREATININE: 1.06 mg/dL (ref 0.61–1.24)
Chloride: 101 mmol/L (ref 101–111)
GFR calc non Af Amer: >60 mL/min (ref >60)
Glucose, Bld: 142 mg/dL — ABNORMAL HIGH (ref 65–99)
Potassium: 3.8 mmol/L (ref 3.5–5.1)
SODIUM: 136 mmol/L (ref 135–145)

## 2017-02-25 MED ORDER — DEXTROSE IN LACTATED RINGERS 5 % IV SOLN
INTRAVENOUS | Status: DC
  Administered 2017-02-25: via INTRAVENOUS

## 2017-02-25 MED ORDER — ONDANSETRON HCL 4 MG/2ML IJ SOLN
4.0000 mg | Freq: Four times a day (QID) | INTRAMUSCULAR | Status: DC | PRN
  Administered 2017-02-25: 4 mg via INTRAVENOUS
  Filled 2017-02-25: qty 2

## 2017-02-25 NOTE — Progress Notes (Addendum)
   Subjective: Hospital day - 2 Patient reports pain as mild.   Patient seen in rounds with Dr. Shelle IronBeane. Patient is well, but has had some minor complaints of pain in the hip, requiring pain medications. He is comfortable at this time not moving in the bed. Plan is for surgery tomorrow by Dr. Linna CapriceSwinteck. NPO after MN  Objective: Vital signs in last 24 hours: Temp:  [97.6 F (36.4 C)-98 F (36.7 C)] 97.7 F (36.5 C) (01/27 0454) Pulse Rate:  [65-72] 65 (01/27 0454) Resp:  [17-18] 18 (01/27 0454) BP: (121-139)/(64-76) 139/64 (01/27 0454) SpO2:  [98 %-99 %] 98 % (01/27 0454)  Intake/Output from previous day:  Intake/Output Summary (Last 24 hours) at 02/25/2017 0739 Last data filed at 02/25/2017 0455 Gross per 24 hour  Intake 1900 ml  Output 1500 ml  Net 400 ml    Intake/Output this shift: No intake/output data recorded.  Labs: Recent Labs    02/23/17 2201  HGB 14.2   Recent Labs    02/23/17 2201  WBC 13.6*  RBC 4.73  HCT 43.8  PLT 204   Recent Labs    02/23/17 2201  NA 139  K 3.6  CL 105  CO2 24  BUN 32*  CREATININE 1.29*  GLUCOSE 110*  CALCIUM 9.3   Recent Labs    02/23/17 2201  INR 0.99    EXAM General - Patient is Alert, Appropriate and Oriented Extremity - Neurovascular intact Sensation intact distally Intact pulses distally Dorsiflexion/Plantar flexion intact Motor Function - intact, moving foot and toes well on exam.    Past Medical History:  Diagnosis Date  . Arthritis Feb. 2013   Gout- Right foot  . CVA (cerebral infarction)   . GERD (gastroesophageal reflux disease)   . Gout   . Headache    Migraine Headaches  . Raynaud's syndrome   . Stroke [facility](HCC) 2011    Assessment/Plan: Hospital day - 2 Principal Problem:   Closed left hip fracture, initial encounter [facility](HCC) Active Problems:   History of CVA in adulthood   Mild renal insufficiency  Estimated body mass index is 28.66 kg/m as calculated from the following:   Height as of  this encounter: 5\' 8"  (1.727 m).   Weight as of this encounter: 85.5 kg (188 lb 7.9 oz). Allow diet today and then NPO for surgery Bedrest Able to use urinal at this time so hold on foley until surgery if needed.  Labs ordered for AM  Consent ordered Surgical ABX already ordered.  Avel Peacerew Kiyonna Tortorelli, PA-C Orthopaedic Surgery 02/25/2017, 7:39 AM

## 2017-02-25 NOTE — Progress Notes (Signed)
PROGRESS NOTE  Cory Roberts UJW:[id number] DOB: 04-11-1960 DOA: 02/23/2017 PCP: Avis EpleyJackson, Samantha J, PA-C   LOS: 2 days   Brief Narrative / Interim history: 57-year-old male with history of prior CVA without any residual weakness, right hip avascular necrosis status post right total hip replacement in August 2018, presents to the hospital with acute excruciating pain on the left side, patient denies any falls and is not sure how that happened however was found to have left hip fracture, and orthopedic surgery was consulted.  Assessment & Plan: Principal Problem:   Closed left hip fracture, initial encounter [facility](HCC) Active Problems:   History of CVA in adulthood   Mild renal insufficiency  Left hip fracture -X-rays with acute fracture of the left femoral neck, orthopedic surgery consulted, appreciate input -He is low risk for surgery -Continue IV fluids, analgesia, DVT prophylaxis postop per surgery -Plan for operative repair on 1/28  CVA -No residual deficits, aspirin on hold  Slight creatinine elevation -Stable, repeat after fluids  DVT prophylaxis: SCDs Code Status: Full code Family Communication: No family at bedside Disposition Plan: Home versus SNF postop, plan for surgery on Monday  Consultants:   Orthopedic surgery  Procedures:   None   Antimicrobials:  None    Subjective: - no chest pain, shortness of breath, no abdominal pain, nausea or vomiting.   Objective: Vitals:   02/24/17 0759 02/24/17 1419 02/24/17 2208 02/25/17 0454  BP: 121/65 130/70 124/76 139/64  Pulse: 68 72 70 65  Resp: 18 18 17 18   Temp: 97.8 F (36.6 C) 97.6 F (36.4 C) 98 F (36.7 C) 97.7 F (36.5 C)  TempSrc: Oral Oral Oral Oral  SpO2: 99% 98% 99% 98%  Weight:      Height:        Intake/Output Summary (Last 24 hours) at 02/25/2017 1237 Last data filed at 02/25/2017 0455 Gross per 24 hour  Intake 450 ml  Output 975 ml  Net -525 ml   Filed Weights   02/23/17 2044 02/24/17  0156  Weight: 85.7 kg (189 lb) 85.5 kg (188 lb 7.9 oz)    Examination:  No distress Respiratory: CTA Cardiovascular: RRR Abdomen: soft, NT, ND, BS +   Data Reviewed: I have independently reviewed following labs and imaging studies   CBC: Recent Labs  Lab 02/23/17 2201 02/25/17 0722  WBC 13.6* 6.9  NEUTROABS 11.1*  --   HGB 14.2 13.5  HCT 43.8 41.4  MCV 92.6 93.2  PLT 204 174   Basic Metabolic Panel: Recent Labs  Lab 02/23/17 2201 02/25/17 0722  NA 139 136  K 3.6 3.8  CL 105 101  CO2 24 25  GLUCOSE 110* 142*  BUN 32* 18  CREATININE 1.29* 1.06  CALCIUM 9.3 8.9   GFR: Estimated Creatinine Clearance: 79.8 mL/min (by C-G formula based on SCr of 1.06 mg/dL). Liver Function Tests: No results for input(s): AST, ALT, ALKPHOS, BILITOT, PROT, ALBUMIN in the last 168 hours. No results for input(s): LIPASE, AMYLASE in the last 168 hours. No results for input(s): AMMONIA in the last 168 hours. Coagulation Profile: Recent Labs  Lab 02/23/17 2201  INR 0.99   Cardiac Enzymes: No results for input(s): CKTOTAL, CKMB, CKMBINDEX, TROPONINI in the last 168 hours. BNP (last 3 results) No results for input(s): PROBNP in the last 8760 hours. HbA1C: No results for input(s): HGBA1C in the last 72 hours. CBG: Recent Labs  Lab 02/24/17 1115 02/24/17 1653  GLUCAP 122* 102*   Lipid Profile: No results  for input(s): CHOL, HDL, LDLCALC, TRIG, CHOLHDL, LDLDIRECT in the last 72 hours. Thyroid Function Tests: No results for input(s): TSH, T4TOTAL, FREET4, T3FREE, THYROIDAB in the last 72 hours. Anemia Panel: No results for input(s): VITAMINB12, FOLATE, FERRITIN, TIBC, IRON, RETICCTPCT in the last 72 hours. Urine analysis: No results found for: COLORURINE, APPEARANCEUR, LABSPEC, PHURINE, GLUCOSEU, HGBUR, BILIRUBINUR, KETONESUR, PROTEINUR, UROBILINOGEN, NITRITE, LEUKOCYTESUR Sepsis Labs: Invalid input(s): PROCALCITONIN, LACTICIDVEN  No results found for this or any previous  visit (from the past 240 hour(s)).    Radiology Studies: Dg Chest 1 View  Result Date: 02/23/2017 CLINICAL DATA:  Left hip fracture. EXAM: CHEST 1 VIEW COMPARISON:  12/17/2014 FINDINGS: The heart size and mediastinal contours are within normal limits. Both lungs are clear. The visualized skeletal structures are unremarkable. IMPRESSION: No active disease. Electronically Signed   By: Burman Nieves M.D.   On: 02/23/2017 21:28   Dg Pelvis Portable  Result Date: 02/23/2017 CLINICAL DATA:  Left hip pain EXAM: PORTABLE PELVIS 1-2 VIEWS COMPARISON:  Femur radiograph 02/23/2017 FINDINGS: Status post right hip replacement with normal alignment. Pubic symphysis and rami are intact. Left femoral neck fracture again noted. IMPRESSION: 1. Status post right hip replacement with normal alignment 2. Acute left femoral neck fracture. Electronically Signed   By: Jasmine Pang M.D.   On: 02/23/2017 23:15   Dg Knee Left Port  Result Date: 02/23/2017 CLINICAL DATA:  Lambert Mody left hip pain EXAM: PORTABLE LEFT KNEE - 1-2 VIEW COMPARISON:  02/23/2017 FINDINGS: No fracture or malalignment. Minimal patellar spurring. No large knee effusion IMPRESSION: No acute osseous abnormality. Electronically Signed   By: Jasmine Pang M.D.   On: 02/23/2017 23:15   Dg Femur Min 2 Views Left  Result Date: 02/23/2017 CLINICAL DATA:  Patient's leg gave out while walking. Left hip pain and decreased range of motion. EXAM: LEFT FEMUR 2 VIEWS COMPARISON:  None. FINDINGS: There is a transverse fracture of the left femoral neck with superior subluxation of the distal fracture fragment resulting in varus angulation. Fracture lines do not appear to extend to the inter trochanteric region. No dislocation of the hip joint. No definite evidence of an underlying bone lesion. Incidental note of a right hip arthroplasty, incompletely visualized. IMPRESSION: Acute transverse fracture of the left femoral neck with varus angulation. Electronically Signed    By: Burman Nieves M.D.   On: 02/23/2017 21:27     Scheduled Meds: . docusate sodium  100 mg Oral BID  . pantoprazole  40 mg Oral Daily  . senna  2 tablet Oral QHS   Continuous Infusions: . [START ON 02/26/2017]  ceFAZolin (ANCEF) IV    . [START ON 02/26/2017] dextrose 5% lactated ringers    . methocarbamol (ROBAXIN)  IV       Pamella Pert, MD, PhD [facility] Pager [phone]  If 7PM-7AM, please contact night-coverage [email or web address] Password [facility] 02/25/2017, 12:37 PM

## 2017-02-26 ENCOUNTER — Inpatient Hospital Stay (HOSPITAL_COMMUNITY): Payer: BLUE CROSS/BLUE SHIELD | Admitting: Anesthesiology

## 2017-02-26 ENCOUNTER — Encounter (HOSPITAL_COMMUNITY): Payer: Self-pay | Admitting: Surgery

## 2017-02-26 ENCOUNTER — Encounter (HOSPITAL_COMMUNITY)
Admission: EM | Disposition: A | Discharge status: Home Health | Payer: Self-pay | Source: Home / Self Care | Attending: Internal Medicine

## 2017-02-26 ENCOUNTER — Inpatient Hospital Stay (HOSPITAL_COMMUNITY): Payer: BLUE CROSS/BLUE SHIELD

## 2017-02-26 DIAGNOSIS — S72002A Fracture of unspecified part of neck of left femur, initial encounter for closed fracture: Secondary | ICD-10-CM | POA: Diagnosis present

## 2017-02-26 HISTORY — PX: TOTAL HIP ARTHROPLASTY: SHX124

## 2017-02-26 LAB — SURGICAL PCR SCREEN
MRSA, PCR: NEGATIVE
STAPHYLOCOCCUS AUREUS: NEGATIVE

## 2017-02-26 SURGERY — ARTHROPLASTY, HIP, TOTAL, ANTERIOR APPROACH
Anesthesia: Spinal | Site: Hip | Laterality: Left

## 2017-02-26 MED ORDER — FENTANYL CITRATE (PF) 250 MCG/5ML IJ SOLN
INTRAMUSCULAR | Status: AC
  Filled 2017-02-26: qty 5

## 2017-02-26 MED ORDER — CEFAZOLIN SODIUM-DEXTROSE 2-4 GM/100ML-% IV SOLN
2.0000 g | Freq: Four times a day (QID) | INTRAVENOUS | Status: AC
  Administered 2017-02-26 – 2017-02-27 (×2): 2 g via INTRAVENOUS
  Filled 2017-02-26 (×2): qty 100

## 2017-02-26 MED ORDER — PHENYLEPHRINE HCL 10 MG/ML IJ SOLN
INTRAVENOUS | Status: DC | PRN
  Administered 2017-02-26: 40 ug/min via INTRAVENOUS

## 2017-02-26 MED ORDER — FENTANYL CITRATE (PF) 100 MCG/2ML IJ SOLN: 25.0000 ug | INTRAMUSCULAR | Status: DC | PRN

## 2017-02-26 MED ORDER — ONDANSETRON HCL 4 MG/2ML IJ SOLN: 4.0000 mg | Freq: Once | INTRAMUSCULAR | Status: DC | PRN

## 2017-02-26 MED ORDER — PHENYLEPHRINE HCL 10 MG/ML IJ SOLN
INTRAMUSCULAR | Status: DC | PRN
  Administered 2017-02-26: 80 ug via INTRAVENOUS
  Administered 2017-02-26: 160 ug via INTRAVENOUS
  Administered 2017-02-26 (×2): 120 ug via INTRAVENOUS

## 2017-02-26 MED ORDER — PHENYLEPHRINE HCL 10 MG/ML IJ SOLN
INTRAMUSCULAR | Status: AC
  Filled 2017-02-26: qty 1

## 2017-02-26 MED ORDER — PHENYLEPHRINE 40 MCG/ML (10ML) SYRINGE FOR IV PUSH (FOR BLOOD PRESSURE SUPPORT)
PREFILLED_SYRINGE | INTRAVENOUS | Status: AC
  Filled 2017-02-26: qty 20

## 2017-02-26 MED ORDER — KETOROLAC TROMETHAMINE 30 MG/ML IJ SOLN
INTRAMUSCULAR | Status: DC | PRN
  Administered 2017-02-26: 30 mg via INTRA_ARTICULAR

## 2017-02-26 MED ORDER — LACTATED RINGERS IV SOLN
INTRAVENOUS | Status: DC | PRN
  Administered 2017-02-26 (×2): via INTRAVENOUS

## 2017-02-26 MED ORDER — PROPOFOL 500 MG/50ML IV EMUL
INTRAVENOUS | Status: DC | PRN
  Administered 2017-02-26: 20 ug/kg/min via INTRAVENOUS

## 2017-02-26 MED ORDER — PROPOFOL 10 MG/ML IV BOLUS
INTRAVENOUS | Status: AC
  Filled 2017-02-26: qty 20

## 2017-02-26 MED ORDER — BUPIVACAINE-EPINEPHRINE (PF) 0.5% -1:200000 IJ SOLN
INTRAMUSCULAR | Status: DC | PRN
Start: 2017-02-26 — End: 2017-02-26
  Administered 2017-02-26: 30 mL via PERINEURAL

## 2017-02-26 MED ORDER — HYDROMORPHONE HCL 1 MG/ML IJ SOLN: 0.1000 mg | INTRAMUSCULAR | Status: DC | PRN

## 2017-02-26 MED ORDER — BUPIVACAINE IN DEXTROSE 0.75-8.25 % IT SOLN
INTRATHECAL | Status: DC | PRN
  Administered 2017-02-26: 2 mL via INTRATHECAL

## 2017-02-26 MED ORDER — DEXAMETHASONE SODIUM PHOSPHATE 10 MG/ML IJ SOLN
INTRAMUSCULAR | Status: AC
  Filled 2017-02-26: qty 1

## 2017-02-26 MED ORDER — TRANEXAMIC ACID 1000 MG/10ML IV SOLN
1000.0000 mg | INTRAVENOUS | Status: AC
  Administered 2017-02-26: 1000 mg via INTRAVENOUS
  Filled 2017-02-26: qty 10

## 2017-02-26 MED ORDER — MENTHOL 3 MG MT LOZG: 1.0000 | LOZENGE | OROMUCOSAL | Status: DC | PRN

## 2017-02-26 MED ORDER — 0.9 % SODIUM CHLORIDE (POUR BTL) OPTIME
TOPICAL | Status: DC | PRN
  Administered 2017-02-26: 1000 mL

## 2017-02-26 MED ORDER — ONDANSETRON HCL 4 MG/2ML IJ SOLN
INTRAMUSCULAR | Status: AC
  Filled 2017-02-26: qty 2

## 2017-02-26 MED ORDER — ONDANSETRON HCL 4 MG/2ML IJ SOLN
INTRAMUSCULAR | Status: DC | PRN
  Administered 2017-02-26: 4 mg via INTRAVENOUS

## 2017-02-26 MED ORDER — LIDOCAINE 2% (20 MG/ML) 5 ML SYRINGE
INTRAMUSCULAR | Status: AC
  Filled 2017-02-26: qty 5

## 2017-02-26 MED ORDER — KETAMINE HCL 10 MG/ML IJ SOLN
INTRAMUSCULAR | Status: DC | PRN
  Administered 2017-02-26: 20 mg via INTRAVENOUS

## 2017-02-26 MED ORDER — BUPIVACAINE-EPINEPHRINE (PF) 0.5% -1:200000 IJ SOLN
INTRAMUSCULAR | Status: AC
  Filled 2017-02-26: qty 30

## 2017-02-26 MED ORDER — DEXAMETHASONE SODIUM PHOSPHATE 10 MG/ML IJ SOLN
INTRAMUSCULAR | Status: DC | PRN
  Administered 2017-02-26: 10 mg via INTRAVENOUS

## 2017-02-26 MED ORDER — KETOROLAC TROMETHAMINE 30 MG/ML IJ SOLN
INTRAMUSCULAR | Status: AC
  Filled 2017-02-26: qty 1

## 2017-02-26 MED ORDER — HYDROMORPHONE HCL 2 MG PO TABS: 1.0000 mg | ORAL_TABLET | ORAL | Status: DC | PRN

## 2017-02-26 MED ORDER — METOCLOPRAMIDE HCL 5 MG PO TABS: 5.0000 mg | ORAL_TABLET | Freq: Three times a day (TID) | ORAL | Status: DC | PRN

## 2017-02-26 MED ORDER — SODIUM CHLORIDE 0.9 % IJ SOLN
INTRAMUSCULAR | Status: DC | PRN
Start: 2017-02-26 — End: 2017-02-26
  Administered 2017-02-26: 29 mL

## 2017-02-26 MED ORDER — MIDAZOLAM HCL 2 MG/2ML IJ SOLN
INTRAMUSCULAR | Status: AC
  Filled 2017-02-26: qty 2

## 2017-02-26 MED ORDER — METOCLOPRAMIDE HCL 5 MG/ML IJ SOLN: 5.0000 mg | Freq: Three times a day (TID) | INTRAMUSCULAR | Status: DC | PRN

## 2017-02-26 MED ORDER — SODIUM CHLORIDE 0.9 % IR SOLN
Status: DC | PRN
  Administered 2017-02-26: 200 mL

## 2017-02-26 MED ORDER — PHENOL 1.4 % MT LIQD: 1.0000 | OROMUCOSAL | Status: DC | PRN

## 2017-02-26 MED ORDER — KETAMINE HCL-SODIUM CHLORIDE 100-0.9 MG/10ML-% IV SOSY
PREFILLED_SYRINGE | INTRAVENOUS | Status: AC
  Filled 2017-02-26: qty 10

## 2017-02-26 MED ORDER — ACETAMINOPHEN 650 MG RE SUPP: 650.0000 mg | Freq: Four times a day (QID) | RECTAL | Status: DC | PRN

## 2017-02-26 MED ORDER — ENOXAPARIN SODIUM 40 MG/0.4ML ~~LOC~~ SOLN
40.0000 mg | SUBCUTANEOUS | Status: DC
  Administered 2017-02-27: 40 mg via SUBCUTANEOUS
  Filled 2017-02-26: qty 0.4

## 2017-02-26 MED ORDER — ACETAMINOPHEN 325 MG PO TABS: 650.0000 mg | ORAL_TABLET | Freq: Four times a day (QID) | ORAL | Status: DC | PRN

## 2017-02-26 SURGICAL SUPPLY — 52 items
ALCOHOL ISOPROPYL (RUBBING) (MISCELLANEOUS) ×3 IMPLANT
BLADE CLIPPER SURG (BLADE) ×3 IMPLANT
CAPT HIP TOTAL 2 ×3 IMPLANT
CHLORAPREP W/TINT 26ML (MISCELLANEOUS) ×3 IMPLANT
COVER SURGICAL LIGHT HANDLE (MISCELLANEOUS) ×3 IMPLANT
DERMABOND ADVANCED (GAUZE/BANDAGES/DRESSINGS) ×2
DERMABOND ADVANCED .7 DNX12 (GAUZE/BANDAGES/DRESSINGS) ×1 IMPLANT
DRAPE C-ARM 42X72 X-RAY (DRAPES) ×3 IMPLANT
DRAPE STERI IOBAN 125X83 (DRAPES) ×3 IMPLANT
DRAPE U-SHAPE 47X51 STRL (DRAPES) ×6 IMPLANT
DRSG AQUACEL AG ADV 3.5X10 (GAUZE/BANDAGES/DRESSINGS) ×3 IMPLANT
ELECT BLADE 4.0 EZ CLEAN MEGAD (MISCELLANEOUS) ×3
ELECT PENCIL ROCKER SW 15FT (MISCELLANEOUS) ×3 IMPLANT
ELECT REM PT RETURN 9FT ADLT (ELECTROSURGICAL) ×3
ELECTRODE BLDE 4.0 EZ CLN MEGD (MISCELLANEOUS) ×1 IMPLANT
ELECTRODE REM PT RTRN 9FT ADLT (ELECTROSURGICAL) ×1 IMPLANT
EVACUATOR 1/8 PVC DRAIN (DRAIN) IMPLANT
GLOVE BIO SURGEON STRL SZ8.5 (GLOVE) ×6 IMPLANT
GLOVE BIOGEL PI IND STRL 8.5 (GLOVE) ×1 IMPLANT
GLOVE BIOGEL PI INDICATOR 8.5 (GLOVE) ×2
GOWN STRL REUS W/ TWL LRG LVL3 (GOWN DISPOSABLE) ×2 IMPLANT
GOWN STRL REUS W/TWL 2XL LVL3 (GOWN DISPOSABLE) ×3 IMPLANT
GOWN STRL REUS W/TWL LRG LVL3 (GOWN DISPOSABLE) ×4
HANDPIECE INTERPULSE COAX TIP (DISPOSABLE) ×2
HOOD PEEL AWAY FACE SHEILD DIS (HOOD) ×6 IMPLANT
KIT BASIN OR (CUSTOM PROCEDURE TRAY) ×3 IMPLANT
KIT ROOM TURNOVER OR (KITS) ×3 IMPLANT
MANIFOLD NEPTUNE II (INSTRUMENTS) ×3 IMPLANT
MARKER SKIN DUAL TIP RULER LAB (MISCELLANEOUS) ×6 IMPLANT
NEEDLE SPNL 18GX3.5 QUINCKE PK (NEEDLE) ×3 IMPLANT
NS IRRIG 1000ML POUR BTL (IV SOLUTION) ×3 IMPLANT
PACK TOTAL JOINT (CUSTOM PROCEDURE TRAY) ×3 IMPLANT
PACK UNIVERSAL I (CUSTOM PROCEDURE TRAY) ×3 IMPLANT
PAD ARMBOARD 7.5X6 YLW CONV (MISCELLANEOUS) ×3 IMPLANT
SAW OSC TIP CART 19.5X105X1.3 (SAW) ×3 IMPLANT
SEALER BIPOLAR AQUA 6.0 (INSTRUMENTS) ×3 IMPLANT
SET HNDPC FAN SPRY TIP SCT (DISPOSABLE) ×1 IMPLANT
SOL PREP POV-IOD 4OZ 10% (MISCELLANEOUS) IMPLANT
SUT ETHIBOND NAB CT1 #1 30IN (SUTURE) ×3 IMPLANT
SUT MNCRL AB 3-0 PS2 18 (SUTURE) ×3 IMPLANT
SUT MON AB 2-0 CT1 36 (SUTURE) ×3 IMPLANT
SUT VIC AB 1 CT1 27 (SUTURE) ×2
SUT VIC AB 1 CT1 27XBRD ANBCTR (SUTURE) ×1 IMPLANT
SUT VIC AB 2-0 CT1 27 (SUTURE) ×2
SUT VIC AB 2-0 CT1 TAPERPNT 27 (SUTURE) ×1 IMPLANT
SUT VLOC 180 0 24IN GS25 (SUTURE) ×3 IMPLANT
SYR 50ML LL SCALE MARK (SYRINGE) ×3 IMPLANT
TOWEL OR 17X24 6PK STRL BLUE (TOWEL DISPOSABLE) IMPLANT
TOWEL OR 17X26 10 PK STRL BLUE (TOWEL DISPOSABLE) ×3 IMPLANT
TRAY CATH 16FR W/PLASTIC CATH (SET/KITS/TRAYS/PACK) IMPLANT
TRAY FOLEY CATH SILVER 16FR (SET/KITS/TRAYS/PACK) IMPLANT
WATER STERILE IRR 1000ML POUR (IV SOLUTION) IMPLANT

## 2017-02-26 NOTE — Transfer of Care (Signed)
Immediate Anesthesia Transfer of Care Note  Patient: Cory Roberts  Procedure(s) Performed: LEFT TOTAL HIP ARTHROPLASTY; ANTERIOR APPROACH (Left Hip)  Patient Location: PACU  Anesthesia Type:Spinal  Level of Consciousness: awake  Airway & Oxygen Therapy: Patient Spontanous Breathing and Patient connected to face mask oxygen  Post-op Assessment: Report given to RN, Post -op Vital signs reviewed and stable and Patient moving all extremities  Post vital signs: Reviewed and stable  Last Vitals:  Vitals:   02/26/17 1354 02/26/17 1807  BP: 115/72   Pulse: 78   Resp: 16   Temp: 36.7 C (!) (P) 36.2 C  SpO2: 95%     Last Pain:  Vitals:   02/26/17 1807  TempSrc:   PainSc: (P) 0-No pain      Patients Stated Pain Goal: 0 (02/24/17 2147)  Complications: No apparent anesthesia complications

## 2017-02-26 NOTE — Discharge Instructions (Signed)
°Dr. Ramatoulaye Pack °Joint Replacement Specialist °[facility] °[address] °[place], [place] [address] °[phone] ° ° °TOTAL HIP REPLACEMENT POSTOPERATIVE DIRECTIONS ° ° ° °Hip Rehabilitation, Guidelines Following Surgery  ° °WEIGHT BEARING °Weight bearing as tolerated with assist device (walker, cane, etc) as directed, use it as long as suggested by your surgeon or therapist, typically at least 4-6 weeks. ° °The results of a hip operation are greatly improved after range of motion and muscle strengthening exercises. Follow all safety measures which are given to protect your hip. If any of these exercises cause increased pain or swelling in your joint, decrease the amount until you are comfortable again. Then slowly increase the exercises. Call your caregiver if you have problems or questions.  ° °HOME CARE INSTRUCTIONS  °Most of the following instructions are designed to prevent the dislocation of your new hip.  °Remove items at home which could result in a fall. This includes throw rugs or furniture in walking pathways.  °Continue medications as instructed at time of discharge. °· You may have some home medications which will be placed on hold until you complete the course of blood thinner medication. °· You may start showering once you are discharged home. Do not remove your dressing. °Do not put on socks or shoes without following the instructions of your caregivers.   °Sit on chairs with arms. Use the chair arms to help push yourself up when arising.  °Arrange for the use of a toilet seat elevator so you are not sitting low.  °· Walk with walker as instructed.  °You may resume a sexual relationship in one month or when given the OK by your caregiver.  °Use walker as long as suggested by your caregivers.  °You may put full weight on your legs and walk as much as is comfortable. °Avoid periods of inactivity such as sitting longer than an hour when not asleep. This helps prevent  blood clots.  °You may return to work once you are cleared by your surgeon.  °Do not drive a car for 6 weeks or until released by your surgeon.  °Do not drive while taking narcotics.  °Wear elastic stockings for two weeks following surgery during the day but you may remove then at night.  °Make sure you keep all of your appointments after your operation with all of your doctors and caregivers. You should call the office at the above phone number and make an appointment for approximately two weeks after the date of your surgery. °Please pick up a stool softener and laxative for home use as long as you are requiring pain medications. °· ICE to the affected hip every three hours for 30 minutes at a time and then as needed for pain and swelling. Continue to use ice on the hip for pain and swelling from surgery. You may notice swelling that will progress down to the foot and ankle.  This is normal after surgery.  Elevate the leg when you are not up walking on it.   °It is important for you to complete the blood thinner medication as prescribed by your doctor. °· Continue to use the breathing machine which will help keep your temperature down.  It is common for your temperature to cycle up and down following surgery, especially at night when you are not up moving around and exerting yourself.  The breathing machine keeps your lungs expanded and your temperature down. ° °RANGE OF MOTION AND STRENGTHENING EXERCISES  °These exercises are   designed to help you keep full movement of your hip joint. Follow your caregiver's or physical therapist's instructions. Perform all exercises about fifteen times, three times per day or as directed. Exercise both hips, even if you have had only one joint replacement. These exercises can be done on a training (exercise) mat, on the floor, on a table or on a bed. Use whatever works the best and is most comfortable for you. Use music or television while you are exercising so that the exercises  are a pleasant break in your day. This will make your life better with the exercises acting as a break in routine you can look forward to.  °Lying on your back, slowly slide your foot toward your buttocks, raising your knee up off the floor. Then slowly slide your foot back down until your leg is straight again.  °Lying on your back spread your legs as far apart as you can without causing discomfort.  °Lying on your side, raise your upper leg and foot straight up from the floor as far as is comfortable. Slowly lower the leg and repeat.  °Lying on your back, tighten up the muscle in the front of your thigh (quadriceps muscles). You can do this by keeping your leg straight and trying to raise your heel off the floor. This helps strengthen the largest muscle supporting your knee.  °Lying on your back, tighten up the muscles of your buttocks both with the legs straight and with the knee bent at a comfortable angle while keeping your heel on the floor.  ° °SKILLED REHAB INSTRUCTIONS: °If the patient is transferred to a skilled rehab facility following release from the hospital, a list of the current medications will be sent to the facility for the patient to continue.  When discharged from the skilled rehab facility, please have the facility set up the patient's Home Health Physical Therapy prior to being released. Also, the skilled facility will be responsible for providing the patient with their medications at time of release from the facility to include their pain medication and their blood thinner medication. If the patient is still at the rehab facility at time of the two week follow up appointment, the skilled rehab facility will also need to assist the patient in arranging follow up appointment in our office and any transportation needs. ° °MAKE SURE YOU:  °Understand these instructions.  °Will watch your condition.  °Will get help right away if you are not doing well or get worse. ° °Pick up stool softner and  laxative for home use following surgery while on pain medications. °Do not remove your dressing. °The dressing is waterproof--it is OK to take showers. °Continue to use ice for pain and swelling after surgery. °Do not use any lotions or creams on the incision until instructed by your surgeon. °Total Hip Protocol. ° ° °

## 2017-02-26 NOTE — Anesthesia Procedure Notes (Signed)
Procedure Name: MAC Date/Time: 02/26/2017 3:20 PM Performed by: Catalina Gravel, MD Oxygen Delivery Method: Simple face mask Placement Confirmation: positive ETCO2

## 2017-02-26 NOTE — Op Note (Signed)
OPERATIVE REPORT  SURGEON: Samson FredericBrian Samantha Olivera, MD   ASSISTANT: April Green, RNFA.  PREOPERATIVE DIAGNOSIS: Displaced Left femoral neck fracture.   POSTOPERATIVE DIAGNOSIS: Displaced Left femoral neck fracture.   PROCEDURE: Left total hip arthroplasty, anterior approach.   IMPLANTS: Biomet Taperloc Complete Microplasty stem, size 15 x 115, std offset. Biomet G7 Cup, size 58 mm. Biomet E1 liner, size 36 mm, G, neutral. Biomet Bioolox ceramic head ball, size 36 -3 mm.  ANESTHESIA:  Spinal  ESTIMATED BLOOD LOSS:-350 mL    ANTIBIOTICS: 2 g Ancef.  DRAINS: None.  COMPLICATIONS: None.   CONDITION: PACU - hemodynamically stable.   BRIEF CLINICAL NOTE: Cory Roberts is a 57 y.o. male with a displaced Left femoral neck fracture. After failing conservative management, the patient was indicated for total hip arthroplasty. The risks, benefits, and alternatives to the procedure were explained, and the patient elected to proceed.  PROCEDURE IN DETAIL: Surgical site was marked by myself in the pre-op holding area. Once inside the operating room, spinal anesthesia was obtained, and a foley catheter was inserted. The patient was then positioned on the Hana table. All bony prominences were well padded. The hip was prepped and draped in the normal sterile surgical fashion. A time-out was called verifying side and site of surgery. The patient received IV antibiotics within 60 minutes of beginning the procedure.  The direct anterior approach to the hip was performed through the Hueter interval. Lateral femoral circumflex vessels were treated with the Auqumantys. The anterior capsule was exposed and an inverted T capsulotomy was made.The femoral neck cut was made to the level of the templated cut. A corkscrew was placed into the head and the head was removed. The femoral head was found to have eburnated bone. The head was passed to the back table and was measured.  Acetabular exposure was  achieved, and the pulvinar and labrum were excised. Sequential reaming of the acetabulum was then performed up to a size 57 mm reamer. A 58 mm cup was then opened and impacted into place at approximately 40 degrees of abduction and 20 degrees of anteversion. The final polyethylene liner was impacted into place and acetabular osteophytes were removed.   I then gained femoral exposure taking care to protect the abductors and greater trochanter. This was performed using standard external rotation, extension, and adduction. The capsule was peeled off the inner aspect of the greater trochanter, taking care to preserve the short external rotators. A cookie cutter was used to enter the femoral canal, and then the femoral canal finder was placed. Sequential broaching was performed up to a size 15. Calcar planer was used on the femoral neck remnant. I placed a std offset neck and a trial head ball. The hip was reduced. Leg lengths and offset were checked fluoroscopically. The hip was dislocated and trial components were removed. The final implants were placed, and the hip was reduced.  Fluoroscopy was used to confirm component position and leg lengths. At 90 degrees of external rotation and full extension, the hip was stable to an anterior directed force.  The wound was copiously irrigated with normal saline using pulse lavage. Marcaine solution was injected into the periarticular soft tissue. The wound was closed in layers using #1 Vicryl and V-Loc for the fascia, 2-0 Vicryl for the subcutaneous fat, 2-0 Monocryl for the deep dermal layer, 3-0 running Monocryl subcuticular stitch, and Dermabond for the skin. Once the glue was fully dried, an Aquacell Ag dressing was applied. The patient was transported  to the recovery room in stable condition. Sponge, needle, and instrument counts were correct at the end of the case x2. The patient tolerated the procedure well and there were no known complications.

## 2017-02-26 NOTE — Anesthesia Postprocedure Evaluation (Signed)
Anesthesia Post Note  Patient: Lovena LeRicky L Desire  Procedure(s) Performed: LEFT TOTAL HIP ARTHROPLASTY; ANTERIOR APPROACH (Left Hip)     Patient location during evaluation: PACU Anesthesia Type: Spinal Level of consciousness: awake and alert, awake and oriented Pain management: pain level controlled Vital Signs Assessment: post-procedure vital signs reviewed and stable Respiratory status: spontaneous breathing, respiratory function stable and nonlabored ventilation Cardiovascular status: blood pressure returned to baseline and stable Postop Assessment: no headache, no backache, no apparent nausea or vomiting, patient able to bend at knees and spinal receding Anesthetic complications: no    Last Vitals:  Vitals:   02/26/17 1830 02/26/17 2119  BP:  102/71  Pulse:  83  Resp:  18  Temp: 36.7 C (!) 36.4 C  SpO2:  99%    Last Pain:  Vitals:   02/26/17 2119  TempSrc: Oral  PainSc:                  Cecile HearingStephen Edward Turk

## 2017-02-26 NOTE — Anesthesia Preprocedure Evaluation (Addendum)
Anesthesia Evaluation  Patient identified by MRN, date of birth, ID band Patient awake    Reviewed: Allergy & Precautions, NPO status , Patient's Chart, lab work & pertinent test results  Airway Mallampati: II  TM Distance: >3 FB Neck ROM: Full    Dental  (+) Teeth Intact, Dental Advisory Given   Pulmonary former smoker,    Pulmonary exam normal breath sounds clear to auscultation       Cardiovascular negative cardio ROS Normal cardiovascular exam Rhythm:Regular Rate:Normal     Neuro/Psych  Headaches, CVA, No Residual Symptoms negative psych ROS   GI/Hepatic Neg liver ROS, PUD, GERD  Medicated,  Endo/Other  negative endocrine ROS  Renal/GU negative Renal ROS     Musculoskeletal  (+) Arthritis , Osteoarthritis,    Abdominal   Peds  Hematology negative hematology ROS (+) Plt 174k   Anesthesia Other Findings Day of surgery medications reviewed with the patient.  Reproductive/Obstetrics                            Anesthesia Physical  Anesthesia Plan  ASA: III  Anesthesia Plan: Spinal   Post-op Pain Management:    Induction:   PONV Risk Score and Plan: 1 and Ondansetron, Dexamethasone, Midazolam and Treatment may vary due to age or medical condition  Airway Management Planned: Natural Airway and Simple Face Mask  Additional Equipment:   Intra-op Plan:   Post-operative Plan:   Informed Consent: I have reviewed the patients History and Physical, chart, labs and discussed the procedure including the risks, benefits and alternatives for the proposed anesthesia with the patient or authorized representative who has indicated his/her understanding and acceptance.   Dental advisory given  Plan Discussed with: CRNA  Anesthesia Plan Comments: (Plan for spinal in lateral position, left side down. Ketamine IV for sedation prior to moving.)       Anesthesia Quick Evaluation

## 2017-02-26 NOTE — Anesthesia Procedure Notes (Addendum)
Spinal  Patient location during procedure: OR Start time: 02/26/2017 3:34 PM End time: 02/26/2017 3:36 PM Staffing Anesthesiologist: Cecile Hearingurk, Stephen Edward, MD Performed: anesthesiologist  Preanesthetic Checklist Completed: patient identified, surgical consent, pre-op evaluation, timeout performed, IV checked, risks and benefits discussed and monitors and equipment checked Spinal Block Patient position: left lateral decubitus Prep: site prepped and draped and DuraPrep Patient monitoring: continuous pulse ox and blood pressure Approach: midline Location: L3-4 Injection technique: single-shot Needle Needle type: Pencan  Needle gauge: 24 G Assessment Sensory level: T8 Additional Notes Functioning IV was confirmed and monitors were applied. Sterile prep and drape, including hand hygiene, mask and sterile gloves were used. The patient was positioned and the spine was prepped. The skin was anesthetized with lidocaine.  Free flow of clear CSF was obtained prior to injecting local anesthetic into the CSF.  The spinal needle aspirated freely following injection.  The needle was carefully withdrawn.  The patient tolerated the procedure well. Consent was obtained prior to procedure with all questions answered and concerns addressed. Risks including but not limited to bleeding, infection, nerve damage, paralysis, failed block, inadequate analgesia, allergic reaction, high spinal, itching and headache were discussed and the patient wished to proceed.   Arrie AranStephen Turk, MD

## 2017-02-26 NOTE — Interval H&P Note (Signed)
History and Physical Interval Note:  02/26/2017 3:30 PM  Cory Roberts  has presented today for surgery, with the diagnosis of Left femoral neck fracture  The various methods of treatment have been discussed with the patient and family. After consideration of risks, benefits and other options for treatment, the patient has consented to  Procedure(s): LEFT TOTAL HIP ARTHROPLASTY; ANTERIOR APPROACH (Left) as a surgical intervention .  The patient's history has been reviewed, patient examined, no change in status, stable for surgery.  I have reviewed the patient's chart and labs.  Questions were answered to the patient's satisfaction.    The risks, benefits, and alternatives were discussed with the patient. There are risks associated with the surgery including, but not limited to, problems with anesthesia (death), infection, instability (giving out of the joint), dislocation, differences in leg length/angulation/rotation, fracture of bones, loosening or failure of implants, hematoma (blood accumulation) which may require surgical drainage, blood clots, pulmonary embolism, nerve injury (foot drop and lateral thigh numbness), and blood vessel injury. The patient understands these risks and elects to proceed.    Iline OvenBrian J Tacuma Graffam

## 2017-02-26 NOTE — Progress Notes (Signed)
PROGRESS NOTE  Cory Roberts ZOX:[id number] DOB: 18-Sep-1960 DOA: 02/23/2017 PCP: Avis Epley, PA-C   LOS: 3 days   Brief Narrative / Interim history: 57-year-old male with history of prior CVA without any residual weakness, right hip avascular necrosis status post right total hip replacement in August 2018, presents to the hospital with acute excruciating pain on the left side, patient denies any falls and is not sure how that happened however was found to have left hip fracture, and orthopedic surgery was consulted.  Assessment & Plan: Principal Problem:   Closed left hip fracture, initial encounter (HCC) Active Problems:   History of CVA in adulthood   Mild renal insufficiency  Left hip fracture -X-rays with acute fracture of the left femoral neck, orthopedic surgery consulted, appreciate input -He is low risk for surgery -Continue IV fluids, analgesia, DVT prophylaxis postop per surgery -Plan for operative repair today  CVA -No residual deficits, aspirin on hold  Slight creatinine elevation -Stable, repeat within normal limits  DVT prophylaxis: SCDs Code Status: Full code Family Communication: No family at bedside Disposition Plan: Home versus SNF postop  Consultants:   Orthopedic surgery  Procedures:   None   Antimicrobials:  None    Subjective: -Feeling well, awaiting surgery  Objective: Vitals:   02/25/17 0454 02/25/17 1434 02/25/17 2108 02/26/17 0424  BP: 139/64 119/77 122/80 113/74  Pulse: 65 81 89 89  Resp: 18 18 17 16   Temp: 97.7 F (36.5 C) 97.6 F (36.4 C) 98.6 F (37 C) 98 F (36.7 C)  TempSrc: Oral Oral Oral Oral  SpO2: 98% 94% 98% 98%  Weight:      Height:        Intake/Output Summary (Last 24 hours) at 02/26/2017 1114 Last data filed at 02/26/2017 0952 Gross per 24 hour  Intake 280 ml  Output 1900 ml  Net -1620 ml   Filed Weights   02/23/17 2044 02/24/17 0156  Weight: 85.7 kg (189 lb) 85.5 kg (188 lb 7.9 oz)     Examination:  Gen: NAD Respiratory: CTA Cardiovascular: RRR   Data Reviewed: I have independently reviewed following labs and imaging studies   CBC: Recent Labs  Lab 02/23/17 2201 02/25/17 0722  WBC 13.6* 6.9  NEUTROABS 11.1*  --   HGB 14.2 13.5  HCT 43.8 41.4  MCV 92.6 93.2  PLT 204 174   Basic Metabolic Panel: Recent Labs  Lab 02/23/17 2201 02/25/17 0722  NA 139 136  K 3.6 3.8  CL 105 101  CO2 24 25  GLUCOSE 110* 142*  BUN 32* 18  CREATININE 1.29* 1.06  CALCIUM 9.3 8.9   GFR: Estimated Creatinine Clearance: 79.8 mL/min (by C-G formula based on SCr of 1.06 mg/dL). Liver Function Tests: No results for input(s): AST, ALT, ALKPHOS, BILITOT, PROT, ALBUMIN in the last 168 hours. No results for input(s): LIPASE, AMYLASE in the last 168 hours. No results for input(s): AMMONIA in the last 168 hours. Coagulation Profile: Recent Labs  Lab 02/23/17 2201  INR 0.99   Cardiac Enzymes: No results for input(s): CKTOTAL, CKMB, CKMBINDEX, TROPONINI in the last 168 hours. BNP (last 3 results) No results for input(s): PROBNP in the last 8760 hours. HbA1C: No results for input(s): HGBA1C in the last 72 hours. CBG: Recent Labs  Lab 02/24/17 1115 02/24/17 1653  GLUCAP 122* 102*   Lipid Profile: No results for input(s): CHOL, HDL, LDLCALC, TRIG, CHOLHDL, LDLDIRECT in the last 72 hours. Thyroid Function Tests: No results for input(s): TSH,  T4TOTAL, FREET4, T3FREE, THYROIDAB in the last 72 hours. Anemia Panel: No results for input(s): VITAMINB12, FOLATE, FERRITIN, TIBC, IRON, RETICCTPCT in the last 72 hours. Urine analysis: No results found for: COLORURINE, APPEARANCEUR, LABSPEC, PHURINE, GLUCOSEU, HGBUR, BILIRUBINUR, KETONESUR, PROTEINUR, UROBILINOGEN, NITRITE, LEUKOCYTESUR Sepsis Labs: Invalid input(s): PROCALCITONIN, LACTICIDVEN  Recent Results (from the past 240 hour(s))  Surgical pcr screen     Status: None   Collection Time: 02/25/17  9:26 PM  Result Value  Ref Range Status   MRSA, PCR NEGATIVE NEGATIVE Final   Staphylococcus aureus NEGATIVE NEGATIVE Final    Comment: (NOTE) The Xpert SA Assay (FDA approved for NASAL specimens in patients 59 years of age and older), is one component of a comprehensive surveillance program. It is not intended to diagnose infection nor to guide or monitor treatment.       Radiology Studies: No results found.   Scheduled Meds: . docusate sodium  100 mg Oral BID  . pantoprazole  40 mg Oral Daily  . senna  2 tablet Oral QHS   Continuous Infusions: .  ceFAZolin (ANCEF) IV    . dextrose 5% lactated ringers 75 mL/hr at 02/25/17 2352  . methocarbamol (ROBAXIN)  IV       Pamella Pertostin Naleigha Raimondi, MD, PhD [facility] Pager [phone]  If 7PM-7AM, please contact night-coverage [email or web address] Password [email or web address] 02/26/2017, 11:14 AM

## 2017-02-27 ENCOUNTER — Encounter (HOSPITAL_COMMUNITY): Payer: Self-pay | Admitting: Orthopedic Surgery

## 2017-02-27 DIAGNOSIS — S72002A Fracture of unspecified part of neck of left femur, initial encounter for closed fracture: Secondary | ICD-10-CM

## 2017-02-27 DIAGNOSIS — Z8673 Personal history of transient ischemic attack (TIA), and cerebral infarction without residual deficits: Secondary | ICD-10-CM

## 2017-02-27 LAB — CBC
HCT: 34.8 % — ABNORMAL LOW (ref 39.0–52.0)
Hemoglobin: 12.1 g/dL — ABNORMAL LOW (ref 13.0–17.0)
MCH: 31.9 pg (ref 26.0–34.0)
MCHC: 34.8 g/dL (ref 30.0–36.0)
MCV: 91.8 fL (ref 78.0–100.0)
Platelets: 178 10*3/uL (ref 150–400)
RBC: 3.79 MIL/uL — ABNORMAL LOW (ref 4.22–5.81)
RDW: 14.1 % (ref 11.5–15.5)
WBC: 11.8 10*3/uL — ABNORMAL HIGH (ref 4.0–10.5)

## 2017-02-27 LAB — BASIC METABOLIC PANEL WITH GFR
Anion gap: 10 (ref 5–15)
BUN: 25 mg/dL — ABNORMAL HIGH (ref 6–20)
CO2: 24 mmol/L (ref 22–32)
Calcium: 8.7 mg/dL — ABNORMAL LOW (ref 8.9–10.3)
Chloride: 102 mmol/L (ref 101–111)
Creatinine, Ser: 1.09 mg/dL (ref 0.61–1.24)
GFR calc Af Amer: >60 mL/min
GFR calc non Af Amer: >60 mL/min
Glucose, Bld: 160 mg/dL — ABNORMAL HIGH (ref 65–99)
Potassium: 3.8 mmol/L (ref 3.5–5.1)
Sodium: 136 mmol/L (ref 135–145)

## 2017-02-27 MED ORDER — ASPIRIN 81 MG PO CHEW: 81.0000 mg | CHEWABLE_TABLET | Freq: Two times a day (BID) | ORAL | 1 refills | Status: DC

## 2017-02-27 MED ORDER — METHOCARBAMOL 500 MG PO TABS: 500.0000 mg | ORAL_TABLET | Freq: Four times a day (QID) | ORAL | 1 refills | Status: DC | PRN

## 2017-02-27 MED ORDER — OXYCODONE HCL 5 MG PO TABS: 5.0000 mg | ORAL_TABLET | ORAL | 0 refills | Status: DC | PRN

## 2017-02-27 NOTE — Evaluation (Signed)
Physical Therapy Evaluation Patient Details Name: Cory Roberts MRN: [id number] DOB: November 26, 1960 Today's Date: 02/27/2017   History of Present Illness  57yo male who presented to the ED with severe acute L hip pain/inability to walk or bear weight; he fell and had pain weeks ago but was able to perform regular activities until he stood at work and could not walk/had severe pain. Imaging shows L femoral neck fracture. He received L anterior THA 02/26/17. PMH hx CVA, s/p R THR following AVN, OA, hx gout, Raynauds   Clinical Impression   Patient received in bed with spouse present, pleasant and willing to participate in PT this morning. He is able to perform bed mobility with MinA to support L LE, however only requires min guard for functional transfers and gait with RW today. Cues were provided for gait mechanics and posture as well as safety during mobility, patient able to mobilize well overall this morning. He will benefit from skilled PT services during this admission and skilled PT services as per surgeon's recommendation moving forward. Patient was left up in the chair with all needs met, spouse present this morning and RN aware of request for pain medicines.     Follow Up Recommendations DC plan and follow up therapy as arranged by surgeon    Equipment Recommendations  None recommended by PT    Recommendations for Other Services       Precautions / Restrictions Precautions Precautions: Fall;Anterior Hip Precaution Booklet Issued: Yes (comment) Restrictions Weight Bearing Restrictions: Yes LLE Weight Bearing: Weight bearing as tolerated      Mobility  Bed Mobility Overal bed mobility: Needs Assistance Bed Mobility: Supine to Sit     Supine to sit: Min assist     General bed mobility comments: to manage L LE   Transfers Overall transfer level: Needs assistance Equipment used: Rolling walker (2 wheeled) Transfers: Sit to/from Stand Sit to Stand: Min guard          General transfer comment: cues for safety and sequencing   Ambulation/Gait Ambulation/Gait assistance: Min guard Ambulation Distance (Feet): 200 Feet Assistive device: Rolling walker (2 wheeled) Gait Pattern/deviations: Step-through pattern;Decreased step length - right;Decreased stance time - left;Decreased weight shift to left     General Gait Details: cues for upright posture, reduced support on UEs, step lengths   Stairs            Wheelchair Mobility    Modified Rankin (Stroke Patients Only)       Balance Overall balance assessment: No apparent balance deficits (not formally assessed)                                           Pertinent Vitals/Pain Pain Assessment: Faces Faces Pain Scale: Hurts little more Pain Location: L hip  Pain Descriptors / Indicators: Aching;Tightness;Sore Pain Intervention(s): Limited activity within patient's tolerance;Monitored during session;Repositioned;Patient requesting pain meds-RN notified    Home Living Family/patient expects to be discharged to:: Private residence Living Arrangements: Spouse/significant other Available Help at Discharge: Family;Available PRN/intermittently Type of Home: Mobile home Home Access: Stairs to enter Entrance Stairs-Rails: None Entrance Stairs-Number of Steps: 2 Home Layout: One level Home Equipment: Walker - 2 wheels;Bedside commode;Shower seat;Cane - single point      Prior Function Level of Independence: Independent         Comments: But could not ambulate long distances.  Hand Dominance        Extremity/Trunk Assessment   Upper Extremity Assessment Upper Extremity Assessment: Defer to OT evaluation    Lower Extremity Assessment Lower Extremity Assessment: Overall WFL for tasks assessed    Cervical / Trunk Assessment Cervical / Trunk Assessment: Normal  Communication   Communication: No difficulties  Cognition Arousal/Alertness: Awake/alert Behavior  During Therapy: WFL for tasks assessed/performed Overall Cognitive Status: Within Functional Limits for tasks assessed                                        General Comments      Exercises     Assessment/Plan    PT Assessment Patient needs continued PT services  PT Problem List Decreased mobility;Decreased safety awareness;Decreased coordination;Decreased knowledge of precautions;Decreased knowledge of use of DME;Pain       PT Treatment Interventions DME instruction;Therapeutic activities;Gait training;Therapeutic exercise;Patient/family education;Stair training;Balance training;Functional mobility training;Neuromuscular re-education    PT Goals (Current goals can be found in the Care Plan section)  Acute Rehab PT Goals Patient Stated Goal: to go home  PT Goal Formulation: With patient/family Time For Goal Achievement: 03/06/17 Potential to Achieve Goals: Good    Frequency 7X/week   Barriers to discharge        Co-evaluation               AM-PAC PT "6 Clicks" Daily Activity  Outcome Measure Difficulty turning over in bed (including adjusting bedclothes, sheets and blankets)?: Unable Difficulty moving from lying on back to sitting on the side of the bed? : Unable Difficulty sitting down on and standing up from a chair with arms (e.g., wheelchair, bedside commode, etc,.)?: Unable Help needed moving to and from a bed to chair (including a wheelchair)?: A Little Help needed walking in hospital room?: A Little Help needed climbing 3-5 steps with a railing? : A Little 6 Click Score: 12    End of Session Equipment Utilized During Treatment: Gait belt Activity Tolerance: Patient tolerated treatment well Patient left: in chair;with call bell/phone within reach;with family/visitor present Nurse Communication: Patient requests pain meds      Time: 0915-0938 PT Time Calculation (min) (ACUTE ONLY): 23 min   Charges:   PT Evaluation $PT Eval Low  Complexity: 1 Low PT Treatments $Gait Training: 8-22 mins   PT G Codes:        Deniece Ree PT, DPT, CBIS  Supplemental Physical Therapist [place]   Pager [phone]

## 2017-02-27 NOTE — Progress Notes (Signed)
    Subjective:  Patient reports pain as mild to moderate.  Denies N/V/CP/SOB.   Objective:   VITALS:   Vitals:   02/26/17 1830 02/26/17 2119 02/27/17 0145 02/27/17 0647  BP:  102/71 115/65 116/69  Pulse:  83 72 84  Resp:  18 18 18   Temp: 98 F (36.7 C) (!) 97.5 F (36.4 C) 97.7 F (36.5 C) 98.1 F (36.7 C)  TempSrc:  Oral Oral Oral  SpO2:  99% 96% 96%  Weight:      Height:        NAD ABD soft Sensation intact distally Intact pulses distally Dorsiflexion/Plantar flexion intact Incision: dressing C/D/I Compartment soft   Lab Results  Component Value Date   WBC 11.8 (H) 02/27/2017   HGB 12.1 (L) 02/27/2017   HCT 34.8 (L) 02/27/2017   MCV 91.8 02/27/2017   PLT 178 02/27/2017   BMET    Component Value Date/Time   NA 136 02/27/2017 0606   K 3.8 02/27/2017 0606   CL 102 02/27/2017 0606   CO2 24 02/27/2017 0606   GLUCOSE 160 (H) 02/27/2017 0606   BUN 25 (H) 02/27/2017 0606   CREATININE 1.09 02/27/2017 0606   CALCIUM 8.7 (L) 02/27/2017 0606   GFRNONAA >60 02/27/2017 0606   GFRAA >60 02/27/2017 0606     Assessment/Plan: 1 Day Post-Op   Principal Problem:   Closed left hip fracture, initial encounter (HCC) Active Problems:   History of CVA in adulthood   Mild renal insufficiency   Displaced fracture of left femoral neck (HCC)   WBAT with walker DVT ppx: ASA, SCDs, TEDS PO pain control PT/OT Dispo: D/c home with HHPT   Iline OvenBrian J Pruitt Taboada 02/27/2017, 7:52 AM   Samson FredericBrian Taurean Ju, MD Cell [phone]

## 2017-02-27 NOTE — Discharge Summary (Signed)
Physician Discharge Summary  Cory Roberts MRN: [id number] DOB/AGE: Jun 19, 1960 57 y.o.  PCP: Jake Samples, PA-C   Admit date: 02/23/2017 Discharge date: 02/27/2017  Discharge Diagnoses:    Principal Problem:   Closed left hip fracture, initial encounter [facility]) Active Problems:   History of CVA in adulthood   Mild renal insufficiency   Displaced fracture of left femoral neck ([place])    Follow-up recommendations Follow-up with PCP in 3-5 days , including all  additional recommended appointments as below Follow-up CBC, CMP in 3-5 days Follow-up with Rod Can, MD         Allergies as of 02/27/2017      Reactions   Codeine Nausea And Vomiting   Hydrocodone Nausea Only      Medication List    STOP taking these medications   naproxen sodium 220 MG tablet Commonly known as:  ALEVE     TAKE these medications   aspirin 81 MG chewable tablet Chew 1 tablet (81 mg total) by mouth 2 (two) times daily. What changed:  when to take this   BIOFREEZE EX Apply 1 application topically daily as needed (pain).   docusate sodium 100 MG capsule Commonly known as:  COLACE Take 1 capsule (100 mg total) by mouth 2 (two) times daily.   methocarbamol 500 MG tablet Commonly known as:  ROBAXIN Take 1 tablet (500 mg total) by mouth every 6 (six) hours as needed for muscle spasms.   ondansetron 4 MG tablet Commonly known as:  ZOFRAN Take 1 tablet (4 mg total) by mouth every 6 (six) hours as needed for nausea.   oxyCODONE 5 MG immediate release tablet Commonly known as:  ROXICODONE Take 1-2 tablets (5-10 mg total) by mouth every 4 (four) hours as needed for severe pain. What changed:    when to take this  reasons to take this   pantoprazole 40 MG tablet Commonly known as:  PROTONIX TAKE 1 TABLET DAILY 30 MINUTES PRIOR BREAKFAST DAILY. What changed:    how much to take  how to take this  when to take this  additional instructions   senna 8.6 MG Tabs  tablet Commonly known as:  SENOKOT Take 2 tablets (17.2 mg total) by mouth at bedtime.        Discharge Condition: stable    Discharge Instructions Get Medicines reviewed and adjusted: Please take all your medications with you for your next visit with your Primary MD  Please request your Primary MD to go over all hospital tests and procedure/radiological results at the follow up, please ask your Primary MD to get all Hospital records sent to his/her office.  If you experience worsening of your admission symptoms, develop shortness of breath, life threatening emergency, suicidal or homicidal thoughts you must seek medical attention immediately by calling 911 or calling your MD immediately if symptoms less severe.  You must read complete instructions/literature along with all the possible adverse reactions/side effects for all the Medicines you take and that have been prescribed to you. Take any new Medicines after you have completely understood and accpet all the possible adverse reactions/side effects.   Do not drive when taking Pain medications.   Do not take more than prescribed Pain, Sleep and Anxiety Medications  Special Instructions: If you have smoked or chewed Tobacco in the last 2 yrs please stop smoking, stop any regular Alcohol and or any Recreational drug use.  Wear Seat belts while driving.  Please note  You were cared for  by a hospitalist during your hospital stay. Once you are discharged, your primary care physician will handle any further medical issues. Please note that NO REFILLS for any discharge medications will be authorized once you are discharged, as it is imperative that you return to your primary care physician (or establish a relationship with a primary care physician if you do not have one) for your aftercare needs so that they can reassess your need for medications and monitor your lab values.     Allergies  Allergen Reactions  . Codeine Nausea And  Vomiting  . Hydrocodone Nausea Only      Disposition: 01-Home or Self Care   Consults:  orthopedics    Significant Diagnostic Studies:  Dg Chest 1 View  Result Date: 02/23/2017 CLINICAL DATA:  Left hip fracture. EXAM: CHEST 1 VIEW COMPARISON:  12/17/2014 FINDINGS: The heart size and mediastinal contours are within normal limits. Both lungs are clear. The visualized skeletal structures are unremarkable. IMPRESSION: No active disease. Electronically Signed   By: Lucienne Capers M.D.   On: 02/23/2017 21:28   Pelvis Portable  Result Date: 02/26/2017 CLINICAL DATA:  Acute LEFT femoral neck fracture. EXAM: OPERATIVE LEFT HIP (WITH PELVIS IF PERFORMED)  VIEWS TECHNIQUE: Fluoroscopic spot image(s) were submitted for interpretation post-operatively. COMPARISON:  02/23/2017. FINDINGS: Status post LEFT total hip replacement. No adverse features. Previous RIGHT total hip replacement. IMPRESSION: Satisfactory position and alignment. Electronically Signed   By: Staci Righter M.D.   On: 02/26/2017 19:30   Dg Pelvis Portable  Result Date: 02/23/2017 CLINICAL DATA:  Left hip pain EXAM: PORTABLE PELVIS 1-2 VIEWS COMPARISON:  Femur radiograph 02/23/2017 FINDINGS: Status post right hip replacement with normal alignment. Pubic symphysis and rami are intact. Left femoral neck fracture again noted. IMPRESSION: 1. Status post right hip replacement with normal alignment 2. Acute left femoral neck fracture. Electronically Signed   By: Donavan Foil M.D.   On: 02/23/2017 23:15   Dg Knee Left Port  Result Date: 02/23/2017 CLINICAL DATA:  Hervey Ard left hip pain EXAM: PORTABLE LEFT KNEE - 1-2 VIEW COMPARISON:  02/23/2017 FINDINGS: No fracture or malalignment. Minimal patellar spurring. No large knee effusion IMPRESSION: No acute osseous abnormality. Electronically Signed   By: Donavan Foil M.D.   On: 02/23/2017 23:15   Dg C-arm 1-60 Min  Result Date: 02/26/2017 CLINICAL DATA:  Acute LEFT femoral neck fracture.  EXAM: OPERATIVE LEFT HIP (WITH PELVIS IF PERFORMED)  VIEWS TECHNIQUE: Fluoroscopic spot image(s) were submitted for interpretation post-operatively. COMPARISON:  02/23/2017. FINDINGS: Status post LEFT total hip replacement. No adverse features. Previous RIGHT total hip replacement. IMPRESSION: Satisfactory position and alignment. Electronically Signed   By: Staci Righter M.D.   On: 02/26/2017 19:30   Dg C-arm 1-60 Min  Result Date: 02/26/2017 CLINICAL DATA:  Acute LEFT femoral neck fracture. EXAM: OPERATIVE LEFT HIP (WITH PELVIS IF PERFORMED)  VIEWS TECHNIQUE: Fluoroscopic spot image(s) were submitted for interpretation post-operatively. COMPARISON:  02/23/2017. FINDINGS: Status post LEFT total hip replacement. No adverse features. Previous RIGHT total hip replacement. IMPRESSION: Satisfactory position and alignment. Electronically Signed   By: Staci Righter M.D.   On: 02/26/2017 19:30   Dg Hip Operative Unilat W Or W/o Pelvis Left  Result Date: 02/26/2017 CLINICAL DATA:  Acute LEFT femoral neck fracture. EXAM: OPERATIVE LEFT HIP (WITH PELVIS IF PERFORMED)  VIEWS TECHNIQUE: Fluoroscopic spot image(s) were submitted for interpretation post-operatively. COMPARISON:  02/23/2017. FINDINGS: Status post LEFT total hip replacement. No adverse features. Previous RIGHT total hip replacement. IMPRESSION:  Satisfactory position and alignment. Electronically Signed   By: Staci Righter M.D.   On: 02/26/2017 19:30   Dg Femur Min 2 Views Left  Result Date: 02/23/2017 CLINICAL DATA:  Patient's leg gave out while walking. Left hip pain and decreased range of motion. EXAM: LEFT FEMUR 2 VIEWS COMPARISON:  None. FINDINGS: There is a transverse fracture of the left femoral neck with superior subluxation of the distal fracture fragment resulting in varus angulation. Fracture lines do not appear to extend to the inter trochanteric region. No dislocation of the hip joint. No definite evidence of an underlying bone lesion.  Incidental note of a right hip arthroplasty, incompletely visualized. IMPRESSION: Acute transverse fracture of the left femoral neck with varus angulation. Electronically Signed   By: Lucienne Capers M.D.   On: 02/23/2017 21:27      Filed Weights   02/23/17 2044 02/24/17 0156  Weight: 85.7 kg (189 lb) 85.5 kg (188 lb 7.9 oz)     Microbiology: Recent Results (from the past 240 hour(s))  Surgical pcr screen     Status: None   Collection Time: 02/25/17  9:26 PM  Result Value Ref Range Status   MRSA, PCR NEGATIVE NEGATIVE Final   Staphylococcus aureus NEGATIVE NEGATIVE Final    Comment: (NOTE) The Xpert SA Assay (FDA approved for NASAL specimens in patients 25 years of age and older), is one component of a comprehensive surveillance program. It is not intended to diagnose infection nor to guide or monitor treatment.        Blood Culture    Component Value Date/Time   SDES THUMB RIGHT 12/18/2014 1055   SDES WOUND RIGHT THUMB 12/18/2014 1055   SPECREQUEST NONE 12/18/2014 1055   SPECREQUEST NONE 12/18/2014 1055   CULT  12/18/2014 1055    MODERATE METHICILLIN RESISTANT STAPHYLOCOCCUS AUREUS Note: RIFAMPIN AND GENTAMICIN SHOULD NOT BE USED AS SINGLE DRUGS FOR TREATMENT OF STAPH INFECTIONS. This organism DOES NOT demonstrate inducible Clindamycin resistance in vitro. CRITICAL RESULT CALLED TO, READ BACK BY AND VERIFIED WITH: TAMIKA WATKINS  12/21/14 1040 BY SMITHERSJ Performed at [place]  12/18/2014 1055    NO ANAEROBES ISOLATED Performed at [place] 12/21/2014 FINAL 12/18/2014 1055   REPTSTATUS 12/23/2014 FINAL 12/18/2014 1055      Labs: Results for orders placed or performed during the hospital encounter of 02/23/17 (from the past 48 hour(s))  Surgical pcr screen     Status: None   Collection Time: 02/25/17  9:26 PM  Result Value Ref Range   MRSA, PCR NEGATIVE NEGATIVE   Staphylococcus aureus NEGATIVE NEGATIVE     Comment: (NOTE) The Xpert SA Assay (FDA approved for NASAL specimens in patients 24 years of age and older), is one component of a comprehensive surveillance program. It is not intended to diagnose infection nor to guide or monitor treatment.   CBC     Status: Abnormal   Collection Time: 02/27/17  6:06 AM  Result Value Ref Range   WBC 11.8 (H) 4.0 - 10.5 K/uL   RBC 3.79 (L) 4.22 - 5.81 MIL/uL   Hemoglobin 12.1 (L) 13.0 - 17.0 g/dL   HCT 34.8 (L) 39.0 - 52.0 %   MCV 91.8 78.0 - 100.0 fL   MCH 31.9 26.0 - 34.0 pg   MCHC 34.8 30.0 - 36.0 g/dL   RDW 14.1 11.5 - 15.5 %   Platelets 178 150 - 400 K/uL  Basic metabolic panel  Status: Abnormal   Collection Time: 02/27/17  6:06 AM  Result Value Ref Range   Sodium 136 135 - 145 mmol/L   Potassium 3.8 3.5 - 5.1 mmol/L   Chloride 102 101 - 111 mmol/L   CO2 24 22 - 32 mmol/L   Glucose, Bld 160 (H) 65 - 99 mg/dL   BUN 25 (H) 6 - 20 mg/dL   Creatinine, Ser 1.09 0.61 - 1.24 mg/dL   Calcium 8.7 (L) 8.9 - 10.3 mg/dL   GFR calc non Af Amer >60 >60 mL/min   GFR calc Af Amer >60 >60 mL/min    Comment: (NOTE) The eGFR has been calculated using the CKD EPI equation. This calculation has not been validated in all clinical situations. eGFR's persistently <60 mL/min signify possible Chronic Kidney Disease.    Anion gap 10 5 - 15     Lipid Panel     Component Value Date/Time   CHOL 123 10/29/2011 0515   TRIG 63 10/29/2011 0515   HDL 28 (L) 10/29/2011 0515   CHOLHDL 4.4 10/29/2011 0515   VLDL 13 10/29/2011 0515   LDLCALC 82 10/29/2011 0515     Lab Results  Component Value Date   HGBA1C 5.9 (H) 10/29/2011     Lab Results  Component Value Date   LDLCALC 82 10/29/2011   CREATININE 1.09 02/27/2017     HPI :  Cory Roberts is a 57 y.o. male with medical history significant for history of CVA and avascular necrosis status post right total hip replacement, now presenting to the emergency department for evaluation of severe acute left  hip pain with inability to bear weight.  Patient reports that he fell to the ground when a chair that he was sitting in weeks ago experienced some pain in the left hip at that time, but continued to ambulate without difficulty and even carried a 50 pound bag up 3 flights of stairs yesterday.  He was having a usual day at work stood up to walk, felt severe pain in the left hip and a "grinding" sensation.  He was unable to continue bearing weight due to the pain placed in a wheelchair.  He denies any recent fevers or chills eyes any history of chest pain or palpitations.  No recent illness.  ED Course: Upon arrival to the ED, patient is found to be afebrile, saturating well on room air, and with vitals otherwise stable.  EKG features a sinus rhythm with early R transition.  Chest x-ray is negative for acute cardiopulmonary disease.  Radiographs of the left hip demonstrate acute transverse fracture of the left femoral neck with varus angulation.  Radiographs of the pelvis and left knee are negative.  Chemistry panel revealed a creatinine of 1.29, slightly up from priors.  CBC is notable for leukocytosis to 13,600.  Patient was treated with IV fentanyl and morphine.  Surgery was consulted by the ED physician and admission to [facility] stable, and distress, and will be admitted to the medical-surgical unit at [facility] for ongoing evaluation and management of left femoral neck fracture.    HOSPITAL COURSE:  Left hip fracture -X-rays with acute fracture of the left femoral neck, orthopedic surgery consulted, appreciate input - deemed low risk for surgery, status post left total hip arthroplasty anterior approach on 1/28 -DVT prophylaxis and postop analgesia as per orthopedic surgery According to orthopedic surgery and PT evaluation patient can be discharged home with home PT    CVA -No residual  deficits, aspirin on hold  Slight creatinine elevation -Stable, repeat within normal  limits      Discharge Exam:   Blood pressure 116/69, pulse 84, temperature 98.1 F (36.7 C), temperature source Oral, resp. rate 18, height _0  (1.727 m), weight 85.5 kg (188 lb 7.9 oz), SpO2 96 %.  Cardiovascular: S1 & S2 heard, regular rate and rhythm. No extremity edema. 2+ pedal pulses. No significant JVD. Abdomen: No distension, no tenderness, no masses palpated. Bowel sounds normal.  Musculoskeletal: no clubbing / cyanosis. Exquisite tenderness to left hip, neurovascularly intact distally.  Skin: no significant rashes, lesions, ulcers. Warm, dry, well-perfused. Neurologic: CN 2-12 grossly intact. Sensation intact. Strength 5/5       Follow-up Information    Swinte[place], Aaron Edelman, MD. Schedule an appointment as soon as possible for a visit in 2 week(s).   Specialty:  Orthopedic Surgery Why:  For wound re-check Contact information: [address] [place] [address] [phone]        Jake Samples, PA-C. Call.   Specialty:  Family Medicine Why:  Hospital follow-up in 3-5 days, check CBC, BMP  Contact information: [address] [place] [address] [phone]           Signed: Reyne Dumas 02/27/2017, 10:47 AM        Time spent >1 hour

## 2017-02-27 NOTE — Plan of Care (Signed)
  Adequate for Discharge Education: Knowledge of General Education information will improve 02/27/2017 1155 - Adequate for Discharge by Darreld Mcleanox, Farrah Skoda, RN Health Behavior/Discharge Planning: Ability to manage health-related needs will improve 02/27/2017 1155 - Adequate for Discharge by Darreld Mcleanox, Decarlo Rivet, RN Clinical Measurements: Ability to maintain clinical measurements within normal limits will improve 02/27/2017 1155 - Adequate for Discharge by Darreld Mcleanox, Bettye Sitton, RN Will remain free from infection 02/27/2017 1155 - Adequate for Discharge by Darreld Mcleanox, Shamon Cothran, RN Diagnostic test results will improve 02/27/2017 1155 - Adequate for Discharge by Darreld Mcleanox, Zahir Eisenhour, RN Respiratory complications will improve 02/27/2017 1155 - Adequate for Discharge by Darreld Mcleanox, Katsumi Wisler, RN Cardiovascular complication will be avoided 02/27/2017 1155 - Adequate for Discharge by Darreld Mcleanox, Yazmina Pareja, RN Activity: Risk for activity intolerance will decrease 02/27/2017 1155 - Adequate for Discharge by Darreld Mcleanox, Lakrista Scaduto, RN Nutrition: Adequate nutrition will be maintained 02/27/2017 1155 - Adequate for Discharge by Darreld Mcleanox, Najir Roop, RN Coping: Level of anxiety will decrease 02/27/2017 1155 - Adequate for Discharge by Darreld Mcleanox, Quanah Majka, RN Elimination: Will not experience complications related to bowel motility 02/27/2017 1155 - Adequate for Discharge by Darreld Mcleanox, Maryn Freelove, RN Will not experience complications related to urinary retention 02/27/2017 1155 - Adequate for Discharge by Darreld Mcleanox, Raylan Hanton, RN Pain Managment: General experience of comfort will improve 02/27/2017 1155 - Adequate for Discharge by Darreld Mcleanox, Siegfried Vieth, RN Safety: Ability to remain free from injury will improve 02/27/2017 1155 - Adequate for Discharge by Darreld Mcleanox, Chadwin Fury, RN Skin Integrity: Risk for impaired skin integrity will decrease 02/27/2017 1155 - Adequate for Discharge by Darreld Mcleanox, Shameria Trimarco, RN Education: Knowledge of the prescribed therapeutic regimen will improve 02/27/2017 1155 - Adequate for Discharge by Darreld Mcleanox, Tennille Montelongo, RN Understanding of discharge needs  will improve 02/27/2017 1155 - Adequate for Discharge by Darreld Mcleanox, Beniah Magnan, RN Activity: Ability to avoid complications of mobility impairment will improve 02/27/2017 1155 - Adequate for Discharge by Darreld Mcleanox, Nedra Mcinnis, RN Ability to tolerate increased activity will improve 02/27/2017 1155 - Adequate for Discharge by Darreld Mcleanox, Tyra Gural, RN Clinical Measurements: Postoperative complications will be avoided or minimized 02/27/2017 1155 - Adequate for Discharge by Darreld Mcleanox, Levern Kalka, RN Pain Management: Pain level will decrease with appropriate interventions 02/27/2017 1155 - Adequate for Discharge by Darreld Mcleanox, Fynn Vanblarcom, RN Skin Integrity: Signs of wound healing will improve 02/27/2017 1155 - Adequate for Discharge by Darreld Mcleanox, Dyana Magner, RN Education: Verbalization of understanding the information provided (i.e., activity precautions, restrictions, etc) will improve 02/27/2017 1155 - Adequate for Discharge by Darreld Mcleanox, Teddy Pena, RN Clinical Measurements: Postoperative complications will be avoided or minimized 02/27/2017 1155 - Adequate for Discharge by Darreld Mcleanox, Tashi Andujo, RN Self-Concept: Ability to maintain and perform role responsibilities to the fullest extent possible will improve 02/27/2017 1155 - Adequate for Discharge by Darreld Mcleanox, Abshir Paolini, RN Pain Management: Pain level will decrease 02/27/2017 1155 - Adequate for Discharge by Darreld Mcleanox, Magdiel Bartles, RN

## 2017-03-13 ENCOUNTER — Encounter (HOSPITAL_COMMUNITY): Payer: Self-pay

## 2017-03-13 DIAGNOSIS — S72032D Displaced midcervical fracture of left femur, subsequent encounter for closed fracture with routine healing: Secondary | ICD-10-CM | POA: Diagnosis not present

## 2017-03-16 ENCOUNTER — Emergency Department (HOSPITAL_COMMUNITY)
Admission: EM | Admit: 2017-03-16 | Discharge: 2017-03-16 | Disposition: A | Discharge status: Home / Self Care | Payer: BLUE CROSS/BLUE SHIELD | Attending: Emergency Medicine | Admitting: Emergency Medicine

## 2017-03-16 ENCOUNTER — Encounter (HOSPITAL_COMMUNITY): Payer: Self-pay

## 2017-03-16 ENCOUNTER — Emergency Department (HOSPITAL_COMMUNITY): Payer: BLUE CROSS/BLUE SHIELD

## 2017-03-16 DIAGNOSIS — Y792 Prosthetic and other implants, materials and accessory orthopedic devices associated with adverse incidents: Secondary | ICD-10-CM | POA: Diagnosis not present

## 2017-03-16 DIAGNOSIS — Y999 Unspecified external cause status: Secondary | ICD-10-CM | POA: Insufficient documentation

## 2017-03-16 DIAGNOSIS — S73005A Unspecified dislocation of left hip, initial encounter: Secondary | ICD-10-CM | POA: Diagnosis not present

## 2017-03-16 DIAGNOSIS — X58XXXA Exposure to other specified factors, initial encounter: Secondary | ICD-10-CM | POA: Diagnosis not present

## 2017-03-16 DIAGNOSIS — Y929 Unspecified place or not applicable: Secondary | ICD-10-CM | POA: Insufficient documentation

## 2017-03-16 DIAGNOSIS — T84021A Dislocation of internal left hip prosthesis, initial encounter: Secondary | ICD-10-CM | POA: Diagnosis not present

## 2017-03-16 DIAGNOSIS — Y9384 Activity, sleeping: Secondary | ICD-10-CM | POA: Diagnosis not present

## 2017-03-16 DIAGNOSIS — M25552 Pain in left hip: Secondary | ICD-10-CM | POA: Diagnosis not present

## 2017-03-16 DIAGNOSIS — S79912A Unspecified injury of left hip, initial encounter: Secondary | ICD-10-CM | POA: Diagnosis present

## 2017-03-16 DIAGNOSIS — R52 Pain, unspecified: Secondary | ICD-10-CM | POA: Diagnosis not present

## 2017-03-16 MED ORDER — PROPOFOL 10 MG/ML IV BOLUS: 0.5000 mg/kg | Freq: Once | INTRAVENOUS | Status: DC

## 2017-03-16 MED ORDER — ONDANSETRON HCL 4 MG/2ML IJ SOLN
4.0000 mg | Freq: Once | INTRAMUSCULAR | Status: AC
  Administered 2017-03-16: 4 mg via INTRAVENOUS
  Filled 2017-03-16: qty 2

## 2017-03-16 MED ORDER — PROPOFOL 1000 MG/100ML IV EMUL
INTRAVENOUS | Status: AC
  Filled 2017-03-16: qty 100

## 2017-03-16 MED ORDER — OXYCODONE HCL 5 MG PO TABS: 5.0000 mg | ORAL_TABLET | Freq: Four times a day (QID) | ORAL | 0 refills | Status: DC | PRN

## 2017-03-16 MED ORDER — HYDROMORPHONE HCL 1 MG/ML IJ SOLN
1.0000 mg | Freq: Once | INTRAMUSCULAR | Status: AC
  Administered 2017-03-16: 1 mg via INTRAVENOUS
  Filled 2017-03-16: qty 1

## 2017-03-16 MED ORDER — PROPOFOL 10 MG/ML IV BOLUS
INTRAVENOUS | Status: AC | PRN
  Administered 2017-03-16: 40 mg via INTRAVENOUS
  Administered 2017-03-16: 20 mg via INTRAVENOUS
  Administered 2017-03-16: 40 mg via INTRAVENOUS
  Administered 2017-03-16: 20 mg via INTRAVENOUS

## 2017-03-16 NOTE — ED Triage Notes (Signed)
Pt had left  total hip replacement on 02/26/17. Pt reports laying in bed apprx 0430 and like something pulled in his left hip causing severe pain. Pt reports took over one hour to get on side of bed. Left foot externally rotated and shorter in length. Total of 100mg  of fentanyl by EMS. Pt had follow up with [facility] on Tuesday and sutures removed. Incision CDI

## 2017-03-16 NOTE — Sedation Documentation (Signed)
Pt tolerating medication well.  Continues to arouse to verbal stimuli and has purposeful movement

## 2017-03-16 NOTE — ED Provider Notes (Signed)
[facility] EMERGENCY DEPARTMENT Provider Note   CSN: [id number] Arrival date & time: 03/16/17  1002     History   Chief Complaint Chief Complaint  Patient presents with  . Hip Pain    HPI Cory Roberts is a 57 y.o. male.  HPI Patient had left hip replacement on 02/26/17.  States that while in bed this morning he woke with severe left hip pain.  This occurred roughly 0430.  Left lower extremity is shortened and externally rotated.  He has not been able to weight-bear.  Last ate yesterday evening at 11 PM. Past Medical History:  Diagnosis Date  . Arthritis Feb. 2013   Gout- Right foot  . CVA (cerebral infarction)   . GERD (gastroesophageal reflux disease)   . Gout   . Headache    Migraine Headaches  . Raynaud's syndrome   . Stroke [facility]) 2011    Patient Active Problem List   Diagnosis Date Noted  . Displaced fracture of left femoral neck (HCC) 02/26/2017  . Closed left hip fracture, initial encounter (HCC) 02/23/2017  . Mild renal insufficiency 02/23/2017  . Avascular necrosis of hip, right (HCC) 09/18/2016  . Encounter for screening colonoscopy 03/08/2015  . PUD (peptic ulcer disease)   . Dysphagia   . Abscess of right thumb 12/18/2014  . Abscess of thumb 12/15/2014  . Cellulitis and abscess 12/15/2014  . Obesity 10/31/2011  . Dyslipidemia 10/31/2011  . At risk for diabetes mellitus 10/31/2011  . Migraine with aura 10/29/2011  . History of CVA in adulthood 10/28/2011  . Vertigo 10/28/2011  . Pain in limb 05/15/2011    Past Surgical History:  Procedure Laterality Date  . COLONOSCOPY N/A 03/22/2015   Procedure: COLONOSCOPY;  Surgeon: West Bali, MD;  Location: [facility] ENDO SUITE;  Service: Endoscopy;  Laterality: N/A;  1215  . ESOPHAGOGASTRODUODENOSCOPY N/A 12/22/2014   Dr. Darrick Penna: 1. dysphagia due to mid-esophageal web and uncontrolled reflux esophagitis. 2. small hiatal hernia 3. multiple small gastric and duodenal ulcers and mild duodenitis . Path with reactive  gastropathy, negative H. pylori   . ESOPHAGOGASTRODUODENOSCOPY N/A 03/22/2015   Procedure: ESOPHAGOGASTRODUODENOSCOPY (EGD);  Surgeon: West Bali, MD;  Location: [facility] ENDO SUITE;  Service: Endoscopy;  Laterality: N/A;  . HERNIA REPAIR Left 2007  . INCISION AND DRAINAGE Right 12/18/2014   Procedure: INCISION AND DRAINAGE THUMB;  Surgeon: Vickki Hearing, MD;  Location: [facility] ORS;  Service: Orthopedics;  Laterality: Right;  . INCISION AND DRAINAGE Right 12/21/2014   Procedure: INCISION AND DRAINAGE RIGHT THUMB;  Surgeon: Vickki Hearing, MD;  Location: [facility] ORS;  Service: Orthopedics;  Laterality: Right;  . incision and drainage of thumb  12/18/14  . TOTAL HIP ARTHROPLASTY Right 09/18/2016  . TOTAL HIP ARTHROPLASTY Right 09/18/2016   Procedure: TOTAL HIP ARTHROPLASTY ANTERIOR APPROACH;  Surgeon: Samson Frederic, MD;  Location: [facility] OR;  Service: Orthopedics;  Laterality: Right;  . TOTAL HIP ARTHROPLASTY Left 02/26/2017   Procedure: LEFT TOTAL HIP ARTHROPLASTY; ANTERIOR APPROACH;  Surgeon: Samson Frederic, MD;  Location: [facility] OR;  Service: Orthopedics;  Laterality: Left;       Home Medications    Prior to Admission medications   Medication Sig Start Date End Date Taking? Authorizing Provider  aspirin 81 MG chewable tablet Chew 1 tablet (81 mg total) by mouth 2 (two) times daily. 02/27/17  Yes Swinteck, Arlys John, MD  HYDROcodone-acetaminophen (NORCO/VICODIN) 5-325 MG tablet Take 1-2 tablets by mouth every 6 (six) hours as needed for moderate pain.  Yes [provider]  Menthol, Topical Analgesic, (BIOFREEZE EX) Apply 1 application topically daily as needed (pain).   Yes [provider]  methocarbamol (ROBAXIN) 500 MG tablet Take 1 tablet (500 mg total) by mouth every 6 (six) hours as needed for muscle spasms. 02/27/17  Yes Richarda Overlie, MD  pantoprazole (PROTONIX) 40 MG tablet TAKE 1 TABLET DAILY 30 MINUTES PRIOR BREAKFAST DAILY. Patient taking differently: Take 40 mg by mouth  daily before breakfast.  12/19/16  Yes Gelene Mink, NP  docusate sodium (COLACE) 100 MG capsule Take 1 capsule (100 mg total) by mouth 2 (two) times daily. Patient not taking: Reported on 02/24/2017 09/19/16   Samson Frederic, MD  ondansetron (ZOFRAN) 4 MG tablet Take 1 tablet (4 mg total) by mouth every 6 (six) hours as needed for nausea. Patient not taking: Reported on 02/24/2017 09/19/16   Samson Frederic, MD  oxyCODONE (ROXICODONE) 5 MG immediate release tablet Take 1 tablet (5 mg total) by mouth every 6 (six) hours as needed for severe pain. 03/16/17   Loren Racer, MD  senna (SENOKOT) 8.6 MG TABS tablet Take 2 tablets (17.2 mg total) by mouth at bedtime. Patient not taking: Reported on 02/24/2017 09/19/16   Samson Frederic, MD    Family History Family History  Problem Relation Age of Onset  . Cancer Mother   . Hyperlipidemia Mother   . Hypertension Mother   . Heart attack Mother   . Rheumatologic disease Mother   . Cancer Father   . Heart disease Father 83       Heart Disease before age 60  . Heart attack Father   . Diabetes Sister   . Cancer Sister   . Hyperlipidemia Sister   . Hypertension Sister   . Colon cancer Neg Hx     Social History Social History   Tobacco Use  . Smoking status: Former Smoker    Packs/day: 3.00    Years: 30.00    Pack years: 90.00    Types: Cigarettes    Last attempt to quit: 01/30/2005    Years since quitting: 12.1  . Smokeless tobacco: Never Used  Substance Use Topics  . Alcohol use: Yes    Comment: occasionally  . Drug use: No     Allergies   Codeine and Hydrocodone   Review of Systems Review of Systems   Physical Exam Updated Vital Signs BP 120/78   Pulse 64   Temp 97.9 F (36.6 C) (Oral)   Resp 13   SpO2 100%   Physical Exam  Constitutional: He is oriented to person, place, and time. He appears well-developed and well-nourished.  HENT:  Head: Normocephalic and atraumatic.  Mouth/Throat: Oropharynx is clear and  moist.  Eyes: EOM are normal. Pupils are equal, round, and reactive to light.  Neck: Normal range of motion. Neck supple.  Cardiovascular: Normal rate and regular rhythm.  Pulmonary/Chest: Effort normal and breath sounds normal.  Abdominal: Soft. Bowel sounds are normal. There is no tenderness. There is no rebound and no guarding.  Musculoskeletal: Normal range of motion. He exhibits tenderness and deformity. He exhibits no edema.  Left lateral hip tenderness to palpation.  Left lower extremity is shortened and externally rotated.  2+ distal pulses  Neurological: He is alert and oriented to person, place, and time.  Sensation light touch intact.  Patient has limited range of motion of the left hip presumably due to dislocation.  Moving all other extremities without deficit.  Skin: Skin is warm  and dry. Capillary refill takes less than 2 seconds. No rash noted. No erythema.  Psychiatric: He has a normal mood and affect. His behavior is normal.  Nursing note and vitals reviewed.    ED Treatments / Results  Labs (all labs ordered are listed, but only abnormal results are displayed) Labs Reviewed - No data to display  EKG  EKG Interpretation None       Radiology Dg Hip Port Unilat W Or Wo Pelvis 1 View Left  Result Date: 03/16/2017 CLINICAL DATA:  Dislocation of left hip arthroplasty. Postreduction film. EXAM: DG HIP (WITH OR WITHOUT PELVIS) 1V PORT LEFT COMPARISON:  Plain films of the left hip earlier today. FINDINGS: Left total hip replacement is been reduced. No acute abnormality identified. IMPRESSION: Successful reduction of dislocation.  No acute finding. Electronically Signed   By: Drusilla Kannerhomas  Dalessio M.D.   On: 03/16/2017 14:07   Dg Hip Unilat W Or Wo Pelvis 2-3 Views Left  Result Date: 03/16/2017 CLINICAL DATA:  Left hip pain and deformity EXAM: DG HIP (WITH OR WITHOUT PELVIS) 2-3V LEFT COMPARISON:  02/26/2017 FINDINGS: There has been interval dislocation of the femoral component  from the acetabular component in the left hip. The femoral component is displaced superiorly and posteriorly. IMPRESSION: Superior and posterior dislocation of the femoral component of the left hip prosthesis. Electronically Signed   By: Alcide CleverMark  Lukens M.D.   On: 03/16/2017 11:33    Procedures Reduction of dislocation Date/Time: 03/16/2017 1:30 PM Performed by: Loren RacerYelverton, Madysyn Hanken, MD Authorized by: Loren RacerYelverton, Renad Jenniges, MD  Consent: Written consent obtained. Risks and benefits: risks, benefits and alternatives were discussed Consent given by: patient Patient understanding: patient states understanding of the procedure being performed Patient consent: the patient's understanding of the procedure matches consent given Procedure consent: procedure consent matches procedure scheduled Relevant documents: relevant documents present and verified Imaging studies: imaging studies available Patient identity confirmed: arm band Time out: Immediately prior to procedure a "time out" was called to verify the correct patient, procedure, equipment, support staff and site/side marked as required. Local anesthesia used: no  Anesthesia: Local anesthesia used: no  Sedation: Patient sedated: yes Sedation type: moderate (conscious) sedation Sedatives: etomidate Sedation start date/time: 03/16/2017 1:30 PM Sedation end date/time: 03/16/2017 1:50 PM Vitals: Vital signs were monitored during sedation.  Patient tolerance: Patient tolerated the procedure well with no immediate complications    (including critical care time)  Medications Ordered in ED Medications  propofol (DIPRIVAN) 1000 MG/100ML infusion (not administered)  HYDROmorphone (DILAUDID) injection 1 mg (1 mg Intravenous Given 03/16/17 1114)  ondansetron (ZOFRAN) injection 4 mg (4 mg Intravenous Given 03/16/17 1114)  propofol (DIPRIVAN) 10 mg/mL bolus/IV push (40 mg Intravenous Given 03/16/17 1337)     Initial Impression / Assessment and Plan / ED  Course  I have reviewed the triage vital signs and the nursing notes.  Pertinent labs & imaging results that were available during my care of the patient were reviewed by me and considered in my medical decision making (see chart for details).     Discussed with Dr. Linna CapriceSwinteck.  Recommends attempted reduction in the emergency department.  Follow-up in his office in 2 weeks.  Successful left hip reduction.  Patient states he is currently pain-free.  Left leg neurovascularly intact.  Placed in knee immobilizer.  Final Clinical Impressions(s) / ED Diagnoses   Final diagnoses:  Dislocation of left hip, initial encounter [facility](HCC)    ED Discharge Orders        Ordered  oxyCODONE (ROXICODONE) 5 MG immediate release tablet  Every 6 hours PRN     03/16/17 1442       Loren Racer, MD 03/16/17 1448

## 2017-03-16 NOTE — ED Notes (Signed)
[facility]

## 2017-03-16 NOTE — ED Notes (Signed)
Alert, conversant with intermittent eyes closed and resting

## 2017-03-16 NOTE — ED Notes (Signed)
raD IN FOR POST REDUCTION

## 2017-03-31 ENCOUNTER — Emergency Department (HOSPITAL_COMMUNITY): Payer: BLUE CROSS/BLUE SHIELD

## 2017-03-31 ENCOUNTER — Encounter (HOSPITAL_COMMUNITY): Payer: Self-pay

## 2017-03-31 ENCOUNTER — Emergency Department (HOSPITAL_COMMUNITY)
Admission: EM | Admit: 2017-03-31 | Discharge: 2017-03-31 | Disposition: A | Discharge status: Home / Self Care | Payer: BLUE CROSS/BLUE SHIELD | Attending: Emergency Medicine | Admitting: Emergency Medicine

## 2017-03-31 ENCOUNTER — Other Ambulatory Visit: Payer: Self-pay

## 2017-03-31 DIAGNOSIS — Z96642 Presence of left artificial hip joint: Secondary | ICD-10-CM | POA: Insufficient documentation

## 2017-03-31 DIAGNOSIS — X58XXXA Exposure to other specified factors, initial encounter: Secondary | ICD-10-CM | POA: Diagnosis not present

## 2017-03-31 DIAGNOSIS — Z96641 Presence of right artificial hip joint: Secondary | ICD-10-CM | POA: Insufficient documentation

## 2017-03-31 DIAGNOSIS — Z8673 Personal history of transient ischemic attack (TIA), and cerebral infarction without residual deficits: Secondary | ICD-10-CM | POA: Insufficient documentation

## 2017-03-31 DIAGNOSIS — M25559 Pain in unspecified hip: Secondary | ICD-10-CM | POA: Diagnosis not present

## 2017-03-31 DIAGNOSIS — Y92013 Bedroom of single-family (private) house as the place of occurrence of the external cause: Secondary | ICD-10-CM | POA: Diagnosis not present

## 2017-03-31 DIAGNOSIS — Y9384 Activity, sleeping: Secondary | ICD-10-CM | POA: Insufficient documentation

## 2017-03-31 DIAGNOSIS — Y998 Other external cause status: Secondary | ICD-10-CM | POA: Diagnosis not present

## 2017-03-31 DIAGNOSIS — S73005A Unspecified dislocation of left hip, initial encounter: Secondary | ICD-10-CM

## 2017-03-31 DIAGNOSIS — Z7982 Long term (current) use of aspirin: Secondary | ICD-10-CM | POA: Diagnosis not present

## 2017-03-31 DIAGNOSIS — Z87891 Personal history of nicotine dependence: Secondary | ICD-10-CM | POA: Diagnosis not present

## 2017-03-31 DIAGNOSIS — T84021A Dislocation of internal left hip prosthesis, initial encounter: Secondary | ICD-10-CM | POA: Diagnosis not present

## 2017-03-31 DIAGNOSIS — S73004A Unspecified dislocation of right hip, initial encounter: Secondary | ICD-10-CM | POA: Diagnosis not present

## 2017-03-31 DIAGNOSIS — S79912A Unspecified injury of left hip, initial encounter: Secondary | ICD-10-CM | POA: Diagnosis present

## 2017-03-31 DIAGNOSIS — T148XXA Other injury of unspecified body region, initial encounter: Secondary | ICD-10-CM | POA: Diagnosis not present

## 2017-03-31 MED ORDER — HYDROMORPHONE HCL 1 MG/ML IJ SOLN
1.0000 mg | Freq: Once | INTRAMUSCULAR | Status: AC
  Administered 2017-03-31: 1 mg via INTRAVENOUS
  Filled 2017-03-31: qty 1

## 2017-03-31 MED ORDER — ONDANSETRON HCL 4 MG/2ML IJ SOLN
4.0000 mg | Freq: Once | INTRAMUSCULAR | Status: AC
  Administered 2017-03-31: 4 mg via INTRAVENOUS
  Filled 2017-03-31: qty 2

## 2017-03-31 MED ORDER — KETAMINE HCL 10 MG/ML IJ SOLN
0.5000 mg/kg | Freq: Once | INTRAMUSCULAR | Status: AC
  Administered 2017-03-31: 42 mg via INTRAVENOUS
  Filled 2017-03-31: qty 1

## 2017-03-31 MED ORDER — PROPOFOL 10 MG/ML IV BOLUS
40.0000 mg | Freq: Once | INTRAVENOUS | Status: AC
  Administered 2017-03-31: 40 mg via INTRAVENOUS
  Filled 2017-03-31: qty 20

## 2017-03-31 NOTE — Sedation Documentation (Signed)
Patient able to respond to questions appropriately at this time.

## 2017-03-31 NOTE — Sedation Documentation (Signed)
Patient stopped breathing on his own. Ambu bag being used. CO2 0 at this time. No spontaneous respirations. Oral airway in place. O2 saturation increased to 100% on ambu bag.

## 2017-03-31 NOTE — ED Notes (Signed)
Gave patient water to drink as requested. Patient drank with no assistance or difficulty.

## 2017-03-31 NOTE — Sedation Documentation (Signed)
Patient was given a total of 62.5 mg of propofol and 62.5 mg of ketamine via IV prior to reduction procedure.

## 2017-03-31 NOTE — ED Notes (Signed)
Discharged via wheelchair with spouse to drive him home. Patient able to get up to side of bed and take a couple of steps to wheelchair. Knee immobilizer in place on left knee. Patient advised not to drive after discharge due to medication administration.

## 2017-03-31 NOTE — Sedation Documentation (Signed)
Patient awake and hollering "weeeeee". Responds to voice at this time. O2 saturation 100% on 2L via nasal canula.

## 2017-03-31 NOTE — Sedation Documentation (Signed)
Patient awake but confused and sedated. He is able to say "did they get my leg back in already."

## 2017-03-31 NOTE — Sedation Documentation (Signed)
Family updated as to patient's status.

## 2017-03-31 NOTE — Sedation Documentation (Signed)
Patient denies pain and is resting comfortably.  

## 2017-03-31 NOTE — Discharge Instructions (Signed)
Wear knee immobilizer.  Call Dr. Linna CapriceSwinteck on Monday to be seen for evaluation

## 2017-03-31 NOTE — ED Notes (Signed)
Patient alert and awake at this time. Alert and oriented x 3. Requesting something to drink.

## 2017-03-31 NOTE — ED Provider Notes (Signed)
[facility] EMERGENCY DEPARTMENT Provider Note   CSN: [id number] Arrival date & time: 03/31/17  1002     History   Chief Complaint Chief Complaint  Patient presents with  . Hip Pain    HPI Cory Roberts is a 57 y.o. male.  The history is provided by the patient. No language interpreter was used.  Hip Pain  This is a new problem. The current episode started less than 1 hour ago. The problem occurs constantly. The problem has not changed since onset.Nothing aggravates the symptoms. Nothing relieves the symptoms. He has tried nothing for the symptoms. The treatment provided no relief.   Pt complains of left hip pain.  Pt reports he dislocated his prosthetic hip.  Pt reports he woke with hip out of place. Pt reports this happened 2/15 as well.  Past Medical History:  Diagnosis Date  . Arthritis Feb. 2013   Gout- Right foot  . CVA (cerebral infarction)   . GERD (gastroesophageal reflux disease)   . Gout   . Headache    Migraine Headaches  . Raynaud's syndrome   . Stroke [facility]) 2011    Patient Active Problem List   Diagnosis Date Noted  . Displaced fracture of left femoral neck (HCC) 02/26/2017  . Closed left hip fracture, initial encounter (HCC) 02/23/2017  . Mild renal insufficiency 02/23/2017  . Avascular necrosis of hip, right (HCC) 09/18/2016  . Encounter for screening colonoscopy 03/08/2015  . PUD (peptic ulcer disease)   . Dysphagia   . Abscess of right thumb 12/18/2014  . Abscess of thumb 12/15/2014  . Cellulitis and abscess 12/15/2014  . Obesity 10/31/2011  . Dyslipidemia 10/31/2011  . At risk for diabetes mellitus 10/31/2011  . Migraine with aura 10/29/2011  . History of CVA in adulthood 10/28/2011  . Vertigo 10/28/2011  . Pain in limb 05/15/2011    Past Surgical History:  Procedure Laterality Date  . COLONOSCOPY N/A 03/22/2015   Procedure: COLONOSCOPY;  Surgeon: West Bali, MD;  [facility]: [facility];  Service: Endoscopy;  Laterality: N/A;  1215  .  ESOPHAGOGASTRODUODENOSCOPY N/A 12/22/2014   Dr. Darrick Penna: 1. dysphagia due to mid-esophageal web and uncontrolled reflux esophagitis. 2. small hiatal hernia 3. multiple small gastric and duodenal ulcers and mild duodenitis . Path with reactive gastropathy, negative H. pylori   . ESOPHAGOGASTRODUODENOSCOPY N/A 03/22/2015   Procedure: ESOPHAGOGASTRODUODENOSCOPY (EGD);  Surgeon: West Bali, MD;  Location: [facility] ENDO SUITE;  Service: Endoscopy;  Laterality: N/A;  . HERNIA REPAIR Left 2007  . INCISION AND DRAINAGE Right 12/18/2014   Procedure: INCISION AND DRAINAGE THUMB;  Surgeon: Vickki Hearing, MD;  Location: [facility] ORS;  Service: Orthopedics;  Laterality: Right;  . INCISION AND DRAINAGE Right 12/21/2014   Procedure: INCISION AND DRAINAGE RIGHT THUMB;  Surgeon: Vickki Hearing, MD;  Location: [facility] ORS;  Service: Orthopedics;  Laterality: Right;  . incision and drainage of thumb  12/18/14  . TOTAL HIP ARTHROPLASTY Right 09/18/2016  . TOTAL HIP ARTHROPLASTY Right 09/18/2016   Procedure: TOTAL HIP ARTHROPLASTY ANTERIOR APPROACH;  Surgeon: Samson Frederic, MD;  Location: [facility] OR;  Service: Orthopedics;  Laterality: Right;  . TOTAL HIP ARTHROPLASTY Left 02/26/2017   Procedure: LEFT TOTAL HIP ARTHROPLASTY; ANTERIOR APPROACH;  Surgeon: Samson Frederic, MD;  Location: [facility] OR;  Service: Orthopedics;  Laterality: Left;       Home Medications    Prior to Admission medications   Medication Sig Start Date End Date Taking? Authorizing Provider  aspirin 81 MG  chewable tablet Chew 1 tablet (81 mg total) by mouth 2 (two) times daily. 02/27/17  Yes Swinteck, Arlys JohnBrian, MD  Menthol, Topical Analgesic, (BIOFREEZE EX) Apply 1 application topically daily as needed (pain).   Yes [provider]  methocarbamol (ROBAXIN) 500 MG tablet Take 1 tablet (500 mg total) by mouth every 6 (six) hours as needed for muscle spasms. 02/27/17  Yes Richarda OverlieAbrol, Nayana, MD  oxyCODONE (ROXICODONE) 5 MG immediate release tablet Take 1 tablet  (5 mg total) by mouth every 6 (six) hours as needed for severe pain. 03/16/17  Yes Loren RacerYelverton, David, MD  pantoprazole (PROTONIX) 40 MG tablet TAKE 1 TABLET DAILY 30 MINUTES PRIOR BREAKFAST DAILY. Patient taking differently: Take 40 mg by mouth daily before breakfast.  12/19/16  Yes Gelene MinkBoone, Anna W, NP  docusate sodium (COLACE) 100 MG capsule Take 1 capsule (100 mg total) by mouth 2 (two) times daily. Patient not taking: Reported on 02/24/2017 09/19/16   Samson FredericSwinteck, Brian, MD  ondansetron (ZOFRAN) 4 MG tablet Take 1 tablet (4 mg total) by mouth every 6 (six) hours as needed for nausea. Patient not taking: Reported on 02/24/2017 09/19/16   Samson FredericSwinteck, Brian, MD  senna (SENOKOT) 8.6 MG TABS tablet Take 2 tablets (17.2 mg total) by mouth at bedtime. Patient not taking: Reported on 02/24/2017 09/19/16   Samson FredericSwinteck, Brian, MD    Family History Family History  Problem Relation Age of Onset  . Cancer Mother   . Hyperlipidemia Mother   . Hypertension Mother   . Heart attack Mother   . Rheumatologic disease Mother   . Cancer Father   . Heart disease Father [id number]       Heart Disease before age 59  . Heart attack Father   . Diabetes Sister   . Cancer Sister   . Hyperlipidemia Sister   . Hypertension Sister   . Colon cancer Neg Hx     Social History Social History   Tobacco Use  . Smoking status: Former Smoker    Packs/day: 3.00    Years: 30.00    Pack years: 90.00    Types: Cigarettes    Last attempt to quit: 01/30/2005    Years since quitting: 12.1  . Smokeless tobacco: Never Used  Substance Use Topics  . Alcohol use: Yes    Comment: occasionally  . Drug use: No     Allergies   Codeine and Hydrocodone   Review of Systems Review of Systems  All other systems reviewed and are negative.    Physical Exam Updated Vital Signs BP (!) 142/96   Pulse 80   Temp 97.6 F (36.4 C)   Resp 13   Ht 5\' 10"  (1.778 m)   Wt 83.5 kg (184 lb)   SpO2 97%   BMI 26.40 kg/m   Physical Exam    Constitutional: He appears well-developed and well-nourished.  HENT:  Head: Normocephalic and atraumatic.  Right Ear: External ear normal.  Left Ear: External ear normal.  Mouth/Throat: Oropharynx is clear and moist.  Eyes: Conjunctivae and EOM are normal. Pupils are equal, round, and reactive to light.  Neck: Normal range of motion. Neck supple.  Cardiovascular: Normal rate and regular rhythm.  Pulmonary/Chest: Effort normal.  Abdominal: Soft.  Musculoskeletal: Normal range of motion.  Neurological: He is alert.  Skin: Skin is warm.  Nursing note and vitals reviewed.    ED Treatments / Results  Labs (all labs ordered are listed, but only abnormal results are displayed) Labs Reviewed - No data  to display  EKG  EKG Interpretation None       Radiology Dg Hip Unilat W Or Wo Pelvis 1 View Left  Result Date: 03/31/2017 CLINICAL DATA:  Left hip dislocation EXAM: DG HIP (WITH OR WITHOUT PELVIS) 1V*L* COMPARISON:  None. FINDINGS: Total left hip arthroplasty with superior dislocation. No acute fracture. Right total hip arthroplasty without failure or complication. No aggressive osseous lesion. IMPRESSION: Left total hip arthroplasty. Superior dislocation of the left femoral head relative to the acetabulum. Electronically Signed   By: Elige Ko   On: 03/31/2017 12:05   Dg Hip Port Unilat W Or Wo Pelvis 1 View Left  Result Date: 03/31/2017 CLINICAL DATA:  Status post reduction of left hip dislocation. EXAM: DG HIP (WITH OR WITHOUT PELVIS) 1V PORT LEFT COMPARISON:  Radiographs of same day. FINDINGS: Previously described dislocation involving the left hip prosthesis has been successfully reduced. No fracture is noted. IMPRESSION: Successful reduction of previously described left hip prosthesis dislocation. Electronically Signed   By: Lupita Raider, M.D.   On: 03/31/2017 14:25    Procedures Procedures (including critical care time)  Medications Ordered in ED Medications   HYDROmorphone (DILAUDID) injection 1 mg (1 mg Intravenous Given 03/31/17 1122)  ondansetron (ZOFRAN) injection 4 mg (4 mg Intravenous Given 03/31/17 1122)  propofol (DIPRIVAN) 10 mg/mL bolus/IV push 40 mg (40 mg Intravenous Given 03/31/17 1248)  ketamine (KETALAR) injection 42 mg (42 mg Intravenous Given 03/31/17 1248)     Initial Impression / Assessment and Plan / ED Course  I have reviewed the triage vital signs and the nursing notes.  Pertinent labs & imaging results that were available during my care of the patient were reviewed by me and considered in my medical decision making (see chart for details).  Clinical Course as of Mar 31 1501  Sat Mar 31, 2017  1338 DG Hip [place] or [place] Pelvis 1 View Left [LS]    Clinical Course User Index [LS] Elson Areas, [place]    MDM  Xray shows dislocated hip prosthesis.   Pt counseled on sedation and relocation.  Procedure with Dr. Effie Shy.     3:00pm  Pt has returned to normal.  He feels fine. No pain.   Pt advised to leave knee imbolizer on until her sees Dr. Linna Caprice.   Final Clinical Impressions(s) / ED Diagnoses   Final diagnoses:  Dislocated hip, left, initial encounter [facility])    ED Discharge Orders    None    An After Visit Summary was printed and given to the patient.    Elson Areas, [place] 03/31/17 1519    Mancel Bale, MD 04/01/17 1025

## 2017-03-31 NOTE — ED Provider Notes (Signed)
12:45 PM   .Sedation Date/Time: 03/31/2017 1:42 PM Performed by: Mancel BaleWentz, Turkessa Ostrom, MD Authorized by: Mancel BaleWentz, Kalik Hoare, MD   Consent:    Consent obtained:  Verbal   Consent given by:  Patient   Risks discussed:  Allergic reaction, dysrhythmia, inadequate sedation, nausea, prolonged hypoxia resulting in organ damage, respiratory compromise necessitating ventilatory assistance and intubation and vomiting   Alternatives discussed:  Anxiolysis and regional anesthesia Universal protocol:    Procedure explained and questions answered to patient or proxy's satisfaction: yes     Relevant documents present and verified: yes     Test results available and properly labeled: yes     Imaging studies available: yes     Required blood products, implants, devices, and special equipment available: yes     Site/side marked: yes     Immediately prior to procedure a time out was called: yes     Patient identity confirmation method:  Verbally with patient, provided demographic data and arm band Indications:    Procedure performed:  Dislocation reduction   Procedure necessitating sedation performed by:  Physician performing sedation   Intended level of sedation:  Deep Pre-sedation assessment:    Time since last food or drink:  15 hours   ASA classification: class 2 - patient with mild systemic disease     Neck mobility: normal     Thyromental distance:  4 finger widths   Mallampati score:  I - soft palate, uvula, fauces, pillars visible   Pre-sedation assessments completed and reviewed: airway patency, cardiovascular function, hydration status, mental status, nausea/vomiting, pain level, respiratory function and temperature     Pre-sedation assessment completed:  03/31/2017 12:48 PM Immediate pre-procedure details:    Reassessment: Patient reassessed immediately prior to procedure     Reviewed: vital signs, relevant labs/tests and NPO status     Verified: bag valve mask available, emergency equipment available,  intubation equipment available, IV patency confirmed, oxygen available and suction available   Procedure details (see MAR for exact dosages):    Preoxygenation:  Nasal cannula   Sedation:  Propofol (plus Ketamine)   Intra-procedure monitoring:  Blood pressure monitoring, cardiac monitor, continuous pulse oximetry, frequent LOC assessments, frequent vital sign checks and continuous capnometry   Intra-procedure events: airway compromise and respiratory depression     Intra-procedure management:  Airway repositioning, BVM ventilation, oropharyngeal airway and supplemental oxygen   Total Provider sedation time (minutes):  35 Post-procedure details:    Post-sedation assessment completed:  03/31/2017 1:47 PM   Attendance: Constant attendance by certified staff until patient recovered     Recovery: Patient returned to pre-procedure baseline     Post-sedation assessments completed and reviewed: airway patency, cardiovascular function, hydration status, mental status, nausea/vomiting, pain level, respiratory function and temperature     Patient is stable for discharge or admission: yes     Patient tolerance:  Tolerated well, no immediate complications  Reduction of dislocation Date/Time: 04/01/2017 9:49 AM Performed by: Mancel BaleWentz, Gurtha Picker, MD Authorized by: Mancel BaleWentz, Rache Klimaszewski, MD  Consent: Verbal consent obtained. Written consent obtained. Risks and benefits: risks, benefits and alternatives were discussed Consent given by: patient Patient understanding: patient states understanding of the procedure being performed Patient consent: the patient's understanding of the procedure matches consent given Procedure consent: procedure consent matches procedure scheduled Relevant documents: relevant documents present and verified Site marked: the operative site was not marked Imaging studies: imaging studies available Required items: required blood products, implants, devices, and special equipment available Patient  identity confirmed: verbally with  patient, arm band and provided demographic data Time out: Immediately prior to procedure a "time out" was called to verify the correct patient, procedure, equipment, support staff and site/side marked as required. Local anesthesia used: no  Anesthesia: Local anesthesia used: no  Sedation: Patient sedated: yes Sedatives: ketamine (and propofol) Sedation start date/time: 03/31/2017 12:45 PM Sedation end date/time: 03/31/2017 1:25 PM Vitals: Vital signs were monitored during sedation.  Patient tolerance: Patient tolerated the procedure well with no immediate complications Comments: Initial attempt by PA with flexion and rotation failed. Reduced by me with 'Darrel Reach' technique.     Face-to-face evaluation   History: Patient presents for suspected left hip dislocation, which occurred as he turned his leg while lying in bed earlier this morning.  This is his second dislocation since his hip replacement, January 2019.  He did not follow-up with his orthopedist after the first dislocation.  Physical exam: Alert, calm, cooperative.  Left hip deformity, with leg shortening consistent with dislocation.  Neurovascularly intact distally in the left lower leg.   Imaging indicates dislocation of proximal femoral prosthesis.  Medical screening examination/treatment/procedure(s) were conducted as a shared visit with non-physician practitioner(s) and myself.  I personally evaluated the patient during the encounter   Mancel Bale, MD 04/01/17 [phone]

## 2017-03-31 NOTE — ED Triage Notes (Signed)
Pt comes in for possible hip dislocation. States he was lying in bed when this started. Has happened before. Left hip externally rotated and shortened.   Pt had both hips totally replaced. Had left hip done in January and hip popped out in February. This is the second time this has happened.   Pt given 100mcg of Fentanyl by EMS As well as Zofran.

## 2017-04-04 DIAGNOSIS — Z471 Aftercare following joint replacement surgery: Secondary | ICD-10-CM | POA: Diagnosis not present

## 2017-04-04 DIAGNOSIS — Z96642 Presence of left artificial hip joint: Secondary | ICD-10-CM | POA: Diagnosis not present

## 2017-05-07 DIAGNOSIS — Z1389 Encounter for screening for other disorder: Secondary | ICD-10-CM | POA: Diagnosis not present

## 2017-05-07 DIAGNOSIS — M87851 Other osteonecrosis, right femur: Secondary | ICD-10-CM | POA: Diagnosis not present

## 2017-05-07 DIAGNOSIS — R509 Fever, unspecified: Secondary | ICD-10-CM | POA: Diagnosis not present

## 2017-05-07 DIAGNOSIS — J042 Acute laryngotracheitis: Secondary | ICD-10-CM | POA: Diagnosis not present

## 2017-05-07 DIAGNOSIS — J45909 Unspecified asthma, uncomplicated: Secondary | ICD-10-CM | POA: Diagnosis not present

## 2017-05-07 DIAGNOSIS — J329 Chronic sinusitis, unspecified: Secondary | ICD-10-CM | POA: Diagnosis not present

## 2017-05-07 DIAGNOSIS — R498 Other voice and resonance disorders: Secondary | ICD-10-CM | POA: Diagnosis not present

## 2017-05-07 DIAGNOSIS — J9801 Acute bronchospasm: Secondary | ICD-10-CM | POA: Diagnosis not present

## 2017-05-07 DIAGNOSIS — Z6827 Body mass index (BMI) 27.0-27.9, adult: Secondary | ICD-10-CM | POA: Diagnosis not present

## 2017-05-07 DIAGNOSIS — E7841 Elevated Lipoprotein(a): Secondary | ICD-10-CM | POA: Diagnosis not present

## 2017-05-07 DIAGNOSIS — R300 Dysuria: Secondary | ICD-10-CM | POA: Diagnosis not present

## 2017-05-10 ENCOUNTER — Ambulatory Visit (HOSPITAL_COMMUNITY)
Admission: RE | Admit: 2017-05-10 | Discharge: 2017-05-10 | Disposition: A | Discharge status: Home / Self Care | Payer: BLUE CROSS/BLUE SHIELD | Source: Ambulatory Visit | Attending: Internal Medicine | Admitting: Internal Medicine

## 2017-05-10 ENCOUNTER — Other Ambulatory Visit (HOSPITAL_COMMUNITY): Payer: Self-pay | Admitting: Internal Medicine

## 2017-05-10 DIAGNOSIS — R0600 Dyspnea, unspecified: Secondary | ICD-10-CM | POA: Insufficient documentation

## 2017-05-10 DIAGNOSIS — I2510 Atherosclerotic heart disease of native coronary artery without angina pectoris: Secondary | ICD-10-CM | POA: Diagnosis not present

## 2017-05-10 DIAGNOSIS — Z6827 Body mass index (BMI) 27.0-27.9, adult: Secondary | ICD-10-CM | POA: Diagnosis not present

## 2017-05-10 DIAGNOSIS — I700 Atherosclerosis of aorta: Secondary | ICD-10-CM | POA: Diagnosis not present

## 2017-05-10 DIAGNOSIS — J90 Pleural effusion, not elsewhere classified: Secondary | ICD-10-CM | POA: Diagnosis not present

## 2017-05-10 DIAGNOSIS — R079 Chest pain, unspecified: Secondary | ICD-10-CM

## 2017-05-10 DIAGNOSIS — M179 Osteoarthritis of knee, unspecified: Secondary | ICD-10-CM | POA: Diagnosis not present

## 2017-05-10 DIAGNOSIS — E663 Overweight: Secondary | ICD-10-CM | POA: Diagnosis not present

## 2017-05-10 DIAGNOSIS — R0789 Other chest pain: Secondary | ICD-10-CM | POA: Diagnosis not present

## 2017-05-10 DIAGNOSIS — R05 Cough: Secondary | ICD-10-CM | POA: Diagnosis not present

## 2017-05-10 DIAGNOSIS — R0602 Shortness of breath: Secondary | ICD-10-CM | POA: Diagnosis not present

## 2017-05-10 DIAGNOSIS — M169 Osteoarthritis of hip, unspecified: Secondary | ICD-10-CM | POA: Diagnosis not present

## 2017-05-10 DIAGNOSIS — J9801 Acute bronchospasm: Secondary | ICD-10-CM | POA: Diagnosis not present

## 2017-05-10 MED ORDER — IOPAMIDOL (ISOVUE-370) INJECTION 76%
100.0000 mL | Freq: Once | INTRAVENOUS | Status: AC | PRN
  Administered 2017-05-10: 100 mL via INTRAVENOUS

## 2017-05-16 DIAGNOSIS — S72002D Fracture of unspecified part of neck of left femur, subsequent encounter for closed fracture with routine healing: Secondary | ICD-10-CM | POA: Diagnosis not present

## 2017-06-04 DIAGNOSIS — R0602 Shortness of breath: Secondary | ICD-10-CM | POA: Diagnosis not present

## 2017-06-04 DIAGNOSIS — R9389 Abnormal findings on diagnostic imaging of other specified body structures: Secondary | ICD-10-CM | POA: Diagnosis not present

## 2017-06-21 DIAGNOSIS — Z139 Encounter for screening, unspecified: Secondary | ICD-10-CM | POA: Diagnosis not present

## 2017-06-21 DIAGNOSIS — Z719 Counseling, unspecified: Secondary | ICD-10-CM | POA: Diagnosis not present

## 2017-06-21 DIAGNOSIS — E785 Hyperlipidemia, unspecified: Secondary | ICD-10-CM | POA: Diagnosis not present

## 2017-06-21 DIAGNOSIS — Z008 Encounter for other general examination: Secondary | ICD-10-CM | POA: Diagnosis not present

## 2017-06-21 DIAGNOSIS — Z79899 Other long term (current) drug therapy: Secondary | ICD-10-CM | POA: Diagnosis not present

## 2017-06-21 DIAGNOSIS — I639 Cerebral infarction, unspecified: Secondary | ICD-10-CM | POA: Diagnosis not present

## 2017-07-05 DIAGNOSIS — R7301 Impaired fasting glucose: Secondary | ICD-10-CM | POA: Diagnosis not present

## 2017-07-05 DIAGNOSIS — E559 Vitamin D deficiency, unspecified: Secondary | ICD-10-CM | POA: Diagnosis not present

## 2017-07-05 DIAGNOSIS — E785 Hyperlipidemia, unspecified: Secondary | ICD-10-CM | POA: Diagnosis not present

## 2017-07-05 DIAGNOSIS — R748 Abnormal levels of other serum enzymes: Secondary | ICD-10-CM | POA: Diagnosis not present

## 2017-08-09 DIAGNOSIS — Z008 Encounter for other general examination: Secondary | ICD-10-CM | POA: Diagnosis not present

## 2017-08-09 DIAGNOSIS — Z79899 Other long term (current) drug therapy: Secondary | ICD-10-CM | POA: Diagnosis not present

## 2017-08-09 DIAGNOSIS — Z719 Counseling, unspecified: Secondary | ICD-10-CM | POA: Diagnosis not present

## 2017-08-09 DIAGNOSIS — Z139 Encounter for screening, unspecified: Secondary | ICD-10-CM | POA: Diagnosis not present

## 2017-08-09 DIAGNOSIS — E785 Hyperlipidemia, unspecified: Secondary | ICD-10-CM | POA: Diagnosis not present

## 2017-08-09 DIAGNOSIS — I639 Cerebral infarction, unspecified: Secondary | ICD-10-CM | POA: Diagnosis not present

## 2017-08-22 DIAGNOSIS — Z23 Encounter for immunization: Secondary | ICD-10-CM | POA: Diagnosis not present

## 2017-08-22 DIAGNOSIS — M109 Gout, unspecified: Secondary | ICD-10-CM | POA: Diagnosis not present

## 2017-08-22 DIAGNOSIS — E663 Overweight: Secondary | ICD-10-CM | POA: Diagnosis not present

## 2017-08-22 DIAGNOSIS — Z6827 Body mass index (BMI) 27.0-27.9, adult: Secondary | ICD-10-CM | POA: Diagnosis not present

## 2017-08-22 DIAGNOSIS — Z1389 Encounter for screening for other disorder: Secondary | ICD-10-CM | POA: Diagnosis not present

## 2017-08-23 DIAGNOSIS — Z712 Person consulting for explanation of examination or test findings: Secondary | ICD-10-CM | POA: Diagnosis not present

## 2017-08-23 DIAGNOSIS — Z719 Counseling, unspecified: Secondary | ICD-10-CM | POA: Diagnosis not present

## 2017-10-12 DIAGNOSIS — Z1389 Encounter for screening for other disorder: Secondary | ICD-10-CM | POA: Diagnosis not present

## 2017-10-12 DIAGNOSIS — E663 Overweight: Secondary | ICD-10-CM | POA: Diagnosis not present

## 2017-10-12 DIAGNOSIS — I700 Atherosclerosis of aorta: Secondary | ICD-10-CM | POA: Diagnosis not present

## 2017-10-12 DIAGNOSIS — Z23 Encounter for immunization: Secondary | ICD-10-CM | POA: Diagnosis not present

## 2017-10-12 DIAGNOSIS — Z6827 Body mass index (BMI) 27.0-27.9, adult: Secondary | ICD-10-CM | POA: Diagnosis not present

## 2017-10-18 DIAGNOSIS — Z0130 Encounter for examination of blood pressure without abnormal findings: Secondary | ICD-10-CM | POA: Diagnosis not present

## 2017-10-18 DIAGNOSIS — Z7189 Other specified counseling: Secondary | ICD-10-CM | POA: Diagnosis not present

## 2017-11-23 ENCOUNTER — Other Ambulatory Visit: Payer: Self-pay | Admitting: Gastroenterology

## 2017-12-20 DIAGNOSIS — Z0130 Encounter for examination of blood pressure without abnormal findings: Secondary | ICD-10-CM | POA: Diagnosis not present

## 2018-01-10 DIAGNOSIS — Z008 Encounter for other general examination: Secondary | ICD-10-CM | POA: Diagnosis not present

## 2018-02-28 DIAGNOSIS — E559 Vitamin D deficiency, unspecified: Secondary | ICD-10-CM | POA: Diagnosis not present

## 2018-02-28 DIAGNOSIS — Z0130 Encounter for examination of blood pressure without abnormal findings: Secondary | ICD-10-CM | POA: Diagnosis not present

## 2018-02-28 DIAGNOSIS — Z79899 Other long term (current) drug therapy: Secondary | ICD-10-CM | POA: Diagnosis not present

## 2018-02-28 DIAGNOSIS — Z139 Encounter for screening, unspecified: Secondary | ICD-10-CM | POA: Diagnosis not present

## 2018-02-28 DIAGNOSIS — E785 Hyperlipidemia, unspecified: Secondary | ICD-10-CM | POA: Diagnosis not present

## 2018-03-07 DIAGNOSIS — R748 Abnormal levels of other serum enzymes: Secondary | ICD-10-CM | POA: Diagnosis not present

## 2018-03-07 DIAGNOSIS — E785 Hyperlipidemia, unspecified: Secondary | ICD-10-CM | POA: Diagnosis not present

## 2018-03-07 DIAGNOSIS — E559 Vitamin D deficiency, unspecified: Secondary | ICD-10-CM | POA: Diagnosis not present

## 2018-03-07 DIAGNOSIS — R7301 Impaired fasting glucose: Secondary | ICD-10-CM | POA: Diagnosis not present

## 2018-07-25 DIAGNOSIS — E559 Vitamin D deficiency, unspecified: Secondary | ICD-10-CM | POA: Diagnosis not present

## 2018-07-25 DIAGNOSIS — E785 Hyperlipidemia, unspecified: Secondary | ICD-10-CM | POA: Diagnosis not present

## 2018-07-25 DIAGNOSIS — Z79899 Other long term (current) drug therapy: Secondary | ICD-10-CM | POA: Diagnosis not present

## 2018-07-25 DIAGNOSIS — Z139 Encounter for screening, unspecified: Secondary | ICD-10-CM | POA: Diagnosis not present

## 2018-09-05 ENCOUNTER — Other Ambulatory Visit: Payer: Self-pay | Admitting: Gastroenterology

## 2018-09-05 NOTE — Telephone Encounter (Signed)
LMOM to call.

## 2018-09-05 NOTE — Telephone Encounter (Signed)
Please tell the patient I can send in limited Rx, but hasn't been seen in 3 years. Will need a f/u OV for further refills.

## 2018-09-06 NOTE — Telephone Encounter (Signed)
Letter mailed to pt to call for appointment before additional refills.

## 2018-10-12 NOTE — Progress Notes (Signed)
Primary Care Physician:  Ladon Applebaum Primary Gastroenterologist:  Dr. Darrick Penna  Chief Complaint  Patient presents with  . PUD    needs refill    HPI:   Cory Roberts is a 58 y.o. male presenting today for follow-up and medication refills. History significant for GERD, dysphagia secondary to mid esophageal web s/p dilation in 2016 and PUD diagnosed at time of EGD in 2016. He was last seen in our office on 03/08/15 for f/u of PUD and to schedule his colonoscopy.   Colonoscopy on 03/22/15: Impression with mild diverticulosis in the sigmoid colon, moderate-sized internal hemorrhoids.  Otherwise normal.  Next colonoscopy in 10 years (2027). Follow-up EGD on 03/22/15: Impression with stricture of the gastroesophageal junction, medium sized hiatal hernia, mild nonerosive gastritis and moderate duodenitis.  Recommended continuing Protonix twice daily.  Today he states he is doing. He is taking his Protonix daily. Rare breakthrough GERD symptoms as long as he takes his Protonix. Knows to avoid Ketchup. No heartburn or dysphagia. No nausea or vomiting. BMs about twice a day. No constipation or diarrhea. No brbpr or melena. No abdominal pain.  No unintentional weight loss.  Appetite remains good.  Denies fever, chills, lightheadedness, dizziness, presyncope, cold or flu like symptoms.  Past Medical History:  Diagnosis Date  . Arthritis Feb. 2013   Gout- Right foot  . CVA (cerebral infarction)   . GERD (gastroesophageal reflux disease)   . Gout   . Headache    Migraine Headaches  . Raynaud's syndrome   . Stroke [facility]) 2011    Past Surgical History:  Procedure Laterality Date  . COLONOSCOPY N/A 03/22/2015   Procedure: COLONOSCOPY;  Surgeon: West Bali, MD;  [facility]: [facility];  Service: Endoscopy;  Laterality: N/A;  1215  . ESOPHAGOGASTRODUODENOSCOPY N/A 12/22/2014   Dr. Darrick Penna: 1. dysphagia due to mid-esophageal web and uncontrolled reflux esophagitis. 2. small hiatal  hernia 3. multiple small gastric and duodenal ulcers and mild duodenitis . Path with reactive gastropathy, negative H. pylori   . ESOPHAGOGASTRODUODENOSCOPY N/A 03/22/2015   Procedure: ESOPHAGOGASTRODUODENOSCOPY (EGD);  Surgeon: West Bali, MD;  [facility]: [facility];  Service: Endoscopy;  Laterality: N/A;  . HERNIA REPAIR Left 2007  . INCISION AND DRAINAGE Right 12/18/2014   Procedure: INCISION AND DRAINAGE THUMB;  Surgeon: Vickki Hearing, MD;  Location: [facility] ORS;  Service: Orthopedics;  Laterality: Right;  . INCISION AND DRAINAGE Right 12/21/2014   Procedure: INCISION AND DRAINAGE RIGHT THUMB;  Surgeon: Vickki Hearing, MD;  Location: [facility] ORS;  Service: Orthopedics;  Laterality: Right;  . incision and drainage of thumb  12/18/14  . TOTAL HIP ARTHROPLASTY Right 09/18/2016  . TOTAL HIP ARTHROPLASTY Right 09/18/2016   Procedure: TOTAL HIP ARTHROPLASTY ANTERIOR APPROACH;  Surgeon: Samson Frederic, MD;  Location: [facility] OR;  Service: Orthopedics;  Laterality: Right;  . TOTAL HIP ARTHROPLASTY Left 02/26/2017   Procedure: LEFT TOTAL HIP ARTHROPLASTY; ANTERIOR APPROACH;  Surgeon: Samson Frederic, MD;  Location: [facility] OR;  Service: Orthopedics;  Laterality: Left;    Current Outpatient Medications  Medication Sig Dispense Refill  . aspirin 81 MG chewable tablet Chew 1 tablet (81 mg total) by mouth 2 (two) times daily. 60 tablet 1  . pantoprazole (PROTONIX) 40 MG tablet TAKE 1 TABLET DAILY 30 MINUTES PRIOR BREAKFAST DAILY. 90 tablet 0   No current facility-administered medications for this visit.     Allergies as of 10/14/2018 - Review Complete 10/14/2018  Allergen Reaction Noted  .  Codeine Nausea And Vomiting 05/08/2011  . Hydrocodone Nausea Only 10/28/2011    Family History  Problem Relation Age of Onset  . Cancer Mother   . Hyperlipidemia Mother   . Hypertension Mother   . Heart attack Mother   . Rheumatologic disease Mother   . Cancer Father   . Heart disease Father 40       Heart  Disease before age 30  . Heart attack Father   . Diabetes Sister   . Cancer Sister   . Hyperlipidemia Sister   . Hypertension Sister   . Colon cancer Neg Hx     Social History   Socioeconomic History  . Marital status: Married    Spouse name: Not on file  . Number of children: Not on file  . Years of education: Not on file  . Highest education level: Not on file  Occupational History  . Not on file  Social Needs  . Financial resource strain: Not on file  . Food insecurity    Worry: Not on file    Inability: Not on file  . Transportation needs    Medical: Not on file    Non-medical: Not on file  Tobacco Use  . Smoking status: Former Smoker    Packs/day: 3.00    Years: 30.00    Pack years: 90.00    Types: Cigarettes    Quit date: 01/30/2005    Years since quitting: 13.7  . Smokeless tobacco: Never Used  Substance and Sexual Activity  . Alcohol use: Not Currently    Comment: occasionally  . Drug use: No  . Sexual activity: Not on file  Lifestyle  . Physical activity    Days per week: Not on file    Minutes per session: Not on file  . Stress: Not on file  Relationships  . Social Herbalist on phone: Not on file    Gets together: Not on file    Attends religious service: Not on file    Active member of club or organization: Not on file    Attends meetings of clubs or organizations: Not on file    Relationship status: Not on file  . Intimate partner violence    Fear of current or ex partner: Not on file    Emotionally abused: Not on file    Physically abused: Not on file    Forced sexual activity: Not on file  Other Topics Concern  . Not on file  Social History Narrative  . Not on file    Review of Systems: Gen: See HPI CV: Denies chest pain, heart palpitations, peripheral edema  Resp: Denies shortness of breath or cough  GI: See HPI GU : Denies urinary burning, urinary frequency, urinary hesitancy MS: Hips hurt all the time. States he just  deals with it.   Derm: Denies rash Psych: Denies depression, anxiety Heme: Denies bruising, bleeding  Physical Exam: BP (!) 145/88   Pulse 60   Temp (!) 96.2 F (35.7 C) (Temporal)   Ht 5\' 10"  (1.778 m)   Wt 200 lb 12.8 oz (91.1 kg)   BMI 28.81 kg/m  General:   Alert and oriented. Pleasant and cooperative. Well-nourished and well-developed.  Head:  Normocephalic and atraumatic. Eyes:  Without icterus, sclera clear and conjunctiva pink.  Ears:  Normal auditory acuity. Nose:  No deformity, discharge,  or lesions. Lungs:  Clear to auscultation bilaterally. No wheezes, rales, or rhonchi. No distress.  Heart:  S1,  S2 present without murmurs appreciated.  Abdomen:  +BS, soft, non-tender and non-distended. No HSM noted. No guarding or rebound. No masses appreciated.  Rectal:  Deferred  Msk:  Symmetrical without gross deformities. Normal posture. Extremities:  Without edema. Neurologic:  Alert and  oriented x4;  grossly normal neurologically. Skin:  Intact without significant lesions or rashes. Psych:  Alert and cooperative. Normal mood and affect.

## 2018-10-14 ENCOUNTER — Encounter: Payer: Self-pay | Admitting: Gastroenterology

## 2018-10-14 ENCOUNTER — Other Ambulatory Visit: Payer: Self-pay

## 2018-10-14 ENCOUNTER — Ambulatory Visit (INDEPENDENT_AMBULATORY_CARE_PROVIDER_SITE_OTHER): Payer: BC Managed Care – PPO | Admitting: Gastroenterology

## 2018-10-14 VITALS — BP 145/88 | HR 60 | Temp 96.2°F | Ht 70.0 in | Wt 200.8 lb

## 2018-10-14 DIAGNOSIS — K219 Gastro-esophageal reflux disease without esophagitis: Secondary | ICD-10-CM | POA: Diagnosis not present

## 2018-10-14 MED ORDER — PANTOPRAZOLE SODIUM 40 MG PO TBEC: 40.0000 mg | DELAYED_RELEASE_TABLET | Freq: Every day | ORAL | 3 refills | Status: DC

## 2018-10-14 NOTE — Assessment & Plan Note (Signed)
Chronic history of GERD.  Also with history of peptic ulcer disease diagnosed on EGD in 2016.  Follow-up EGD in 2017 with mild nonerosive gastritis and moderate duodenitis.  He continues to take Protonix 40 mg daily and GERD symptoms are well controlled.  Takes aspirin 81 mg daily.  No other regular NSAIDs.  No alarm symptoms at this time.  Continue Protonix 40 mg daily.  Prescription was refilled. Follow-up in 1 year.

## 2018-10-14 NOTE — Patient Instructions (Signed)
I have sent in a refill of your Protonix.  Please continue to take this daily 30 minutes before breakfast.  We will plan to see you back in 1 year.  If you develop any new symptoms or worsening symptoms please call and we will see you sooner.  I am glad you are doing well!  Aliene Altes, PA-C [facility] Gastroenterology

## 2019-02-27 DIAGNOSIS — E559 Vitamin D deficiency, unspecified: Secondary | ICD-10-CM | POA: Diagnosis not present

## 2019-02-27 DIAGNOSIS — E785 Hyperlipidemia, unspecified: Secondary | ICD-10-CM | POA: Diagnosis not present

## 2019-02-27 DIAGNOSIS — Z79899 Other long term (current) drug therapy: Secondary | ICD-10-CM | POA: Diagnosis not present

## 2019-02-27 DIAGNOSIS — Z0130 Encounter for examination of blood pressure without abnormal findings: Secondary | ICD-10-CM | POA: Diagnosis not present

## 2019-02-27 DIAGNOSIS — Z139 Encounter for screening, unspecified: Secondary | ICD-10-CM | POA: Diagnosis not present

## 2019-03-06 DIAGNOSIS — E559 Vitamin D deficiency, unspecified: Secondary | ICD-10-CM | POA: Diagnosis not present

## 2019-03-06 DIAGNOSIS — E785 Hyperlipidemia, unspecified: Secondary | ICD-10-CM | POA: Diagnosis not present

## 2019-03-06 DIAGNOSIS — R7301 Impaired fasting glucose: Secondary | ICD-10-CM | POA: Diagnosis not present

## 2019-09-17 ENCOUNTER — Emergency Department (HOSPITAL_COMMUNITY)
Admission: EM | Admit: 2019-09-17 | Discharge: 2019-09-17 | Disposition: A | Discharge status: Home / Self Care | Payer: BC Managed Care – PPO | Attending: Emergency Medicine | Admitting: Emergency Medicine

## 2019-09-17 ENCOUNTER — Ambulatory Visit
Admission: EM | Admit: 2019-09-17 | Discharge: 2019-09-17 | Disposition: A | Discharge status: Home / Self Care | Payer: BC Managed Care – PPO

## 2019-09-17 ENCOUNTER — Encounter (HOSPITAL_COMMUNITY): Payer: Self-pay | Admitting: Emergency Medicine

## 2019-09-17 ENCOUNTER — Emergency Department (HOSPITAL_COMMUNITY): Payer: BC Managed Care – PPO

## 2019-09-17 DIAGNOSIS — K5790 Diverticulosis of intestine, part unspecified, without perforation or abscess without bleeding: Secondary | ICD-10-CM | POA: Diagnosis present

## 2019-09-17 DIAGNOSIS — N132 Hydronephrosis with renal and ureteral calculous obstruction: Secondary | ICD-10-CM | POA: Insufficient documentation

## 2019-09-17 DIAGNOSIS — N201 Calculus of ureter: Secondary | ICD-10-CM | POA: Diagnosis not present

## 2019-09-17 DIAGNOSIS — I700 Atherosclerosis of aorta: Secondary | ICD-10-CM | POA: Diagnosis not present

## 2019-09-17 DIAGNOSIS — K5730 Diverticulosis of large intestine without perforation or abscess without bleeding: Secondary | ICD-10-CM | POA: Diagnosis not present

## 2019-09-17 DIAGNOSIS — Z96643 Presence of artificial hip joint, bilateral: Secondary | ICD-10-CM | POA: Diagnosis not present

## 2019-09-17 DIAGNOSIS — R109 Unspecified abdominal pain: Secondary | ICD-10-CM | POA: Diagnosis not present

## 2019-09-17 LAB — CBC WITH DIFFERENTIAL/PLATELET
Abs Immature Granulocytes: 0.07 10*3/uL (ref 0.00–0.07)
Basophils Absolute: 0 10*3/uL (ref 0.0–0.1)
Basophils Relative: 0 %
Eosinophils Absolute: 0 10*3/uL (ref 0.0–0.5)
Eosinophils Relative: 0 %
HCT: 46.4 % (ref 39.0–52.0)
Hemoglobin: 15.8 g/dL (ref 13.0–17.0)
Immature Granulocytes: 1 %
Lymphocytes Relative: 8 %
Lymphs Abs: 1.3 10*3/uL (ref 0.7–4.0)
MCH: 33.5 pg (ref 26.0–34.0)
MCHC: 34.1 g/dL (ref 30.0–36.0)
MCV: 98.5 fL (ref 80.0–100.0)
Monocytes Absolute: 1.2 10*3/uL — ABNORMAL HIGH (ref 0.1–1.0)
Monocytes Relative: 8 %
Neutro Abs: 12.6 10*3/uL — ABNORMAL HIGH (ref 1.7–7.7)
Neutrophils Relative %: 83 %
Platelets: 200 10*3/uL (ref 150–400)
RBC: 4.71 MIL/uL (ref 4.22–5.81)
RDW: 12.9 % (ref 11.5–15.5)
WBC: 15.2 10*3/uL — ABNORMAL HIGH (ref 4.0–10.5)
nRBC: 0 % (ref 0.0–0.2)

## 2019-09-17 LAB — COMPREHENSIVE METABOLIC PANEL
ALT: 38 U/L (ref 0–44)
AST: 23 U/L (ref 15–41)
Albumin: 4.5 g/dL (ref 3.5–5.0)
Alkaline Phosphatase: 114 U/L (ref 38–126)
Anion gap: 12 (ref 5–15)
BUN: 27 mg/dL — ABNORMAL HIGH (ref 8–23)
CO2: 24 mmol/L (ref 22–32)
Calcium: 9.6 mg/dL (ref 8.9–10.3)
Chloride: 99 mmol/L (ref 98–111)
Creatinine, Ser: 1.8 mg/dL — ABNORMAL HIGH (ref 0.61–1.24)
GFR calc Af Amer: 46 mL/min — ABNORMAL LOW (ref >60)
GFR calc non Af Amer: 40 mL/min — ABNORMAL LOW (ref >60)
Glucose, Bld: 119 mg/dL — ABNORMAL HIGH (ref 70–99)
Potassium: 3.5 mmol/L (ref 3.5–5.1)
Sodium: 135 mmol/L (ref 135–145)
Total Bilirubin: 2.2 mg/dL — ABNORMAL HIGH (ref 0.3–1.2)
Total Protein: 7.5 g/dL (ref 6.5–8.1)

## 2019-09-17 LAB — URINALYSIS, ROUTINE W REFLEX MICROSCOPIC
Bilirubin Urine: NEGATIVE
Glucose, UA: NEGATIVE mg/dL
Hgb urine dipstick: NEGATIVE
Ketones, ur: 5 mg/dL — AB
Leukocytes,Ua: NEGATIVE
Nitrite: NEGATIVE
Protein, ur: NEGATIVE mg/dL
Specific Gravity, Urine: 1.023 (ref 1.005–1.030)
pH: 6 (ref 5.0–8.0)

## 2019-09-17 LAB — LIPASE, BLOOD: Lipase: 20 U/L (ref 11–51)

## 2019-09-17 MED ORDER — ONDANSETRON 4 MG PO TBDP: 4.0000 mg | ORAL_TABLET | Freq: Three times a day (TID) | ORAL | 0 refills | Status: DC | PRN

## 2019-09-17 MED ORDER — OXYCODONE-ACETAMINOPHEN 5-325 MG PO TABS: 1.0000 | ORAL_TABLET | Freq: Four times a day (QID) | ORAL | 0 refills | Status: DC | PRN

## 2019-09-17 MED ORDER — HYDROMORPHONE HCL 1 MG/ML IJ SOLN
1.0000 mg | Freq: Once | INTRAMUSCULAR | Status: AC
  Administered 2019-09-17: 1 mg via INTRAVENOUS
  Filled 2019-09-17: qty 1

## 2019-09-17 MED ORDER — SODIUM CHLORIDE 0.9 % IV BOLUS
500.0000 mL | Freq: Once | INTRAVENOUS | Status: AC
  Administered 2019-09-17: 500 mL via INTRAVENOUS

## 2019-09-17 MED ORDER — ONDANSETRON HCL 4 MG/2ML IJ SOLN
4.0000 mg | Freq: Once | INTRAMUSCULAR | Status: AC
  Administered 2019-09-17: 4 mg via INTRAVENOUS
  Filled 2019-09-17: qty 2

## 2019-09-17 MED ORDER — MORPHINE SULFATE (PF) 4 MG/ML IV SOLN
4.0000 mg | Freq: Once | INTRAVENOUS | Status: AC
  Administered 2019-09-17: 4 mg via INTRAVENOUS
  Filled 2019-09-17: qty 1

## 2019-09-17 MED ORDER — ONDANSETRON HCL 4 MG PO TABS: 4.0000 mg | ORAL_TABLET | Freq: Three times a day (TID) | ORAL | 0 refills | Status: DC | PRN

## 2019-09-17 MED ORDER — SODIUM CHLORIDE 0.9 % IV BOLUS
1000.0000 mL | Freq: Once | INTRAVENOUS | Status: AC
  Administered 2019-09-17: 1000 mL via INTRAVENOUS

## 2019-09-17 MED ORDER — OXYCODONE-ACETAMINOPHEN 5-325 MG PO TABS: 2.0000 | ORAL_TABLET | ORAL | 0 refills | Status: DC | PRN

## 2019-09-17 MED ORDER — TAMSULOSIN HCL 0.4 MG PO CAPS: 0.4000 mg | ORAL_CAPSULE | Freq: Every day | ORAL | 0 refills | Status: DC

## 2019-09-17 NOTE — ED Notes (Addendum)
Wife at bedside.

## 2019-09-17 NOTE — ED Provider Notes (Signed)
[facility] EMERGENCY DEPARTMENT Provider Note   CSN: [id number] Arrival date & time: 09/17/19  1331     History Chief Complaint  Patient presents with  . Abdominal Pain    [facility] is a 59 y.o. male with a history as outlined below, most significant for CVA from approximately 20 years ago, GERD, history of peptic ulcer disease and known renal insufficiency presenting with a 2-week history of right lower quadrant abdominal pain which he states has been slowly worsening since symptoms began, now describing a sharp constant pain which is worsened with palpation but not worsened with movement or ambulation, but does endorse increased pain when he bends over to tie his shoes. He also has noticed significant abdominal distention since the symptoms began. He denies nausea or vomiting and has been afebrile. He has had a good appetite. He denies dysuria, hematuria, has no back pain, also no diarrhea or constipation. He states he did have some diarrhea a week ago but resolved spontaneously. He denies rectal bleeding. He was seen at urgent care and sent here to rule out acute appendicitis.  HPI     Past Medical History:  Diagnosis Date  . Arthritis Feb. 2013   Gout- Right foot  . CVA (cerebral infarction)   . GERD (gastroesophageal reflux disease)   . Gout   . Headache    Migraine Headaches  . Raynaud's syndrome   . Stroke [facility]) 2011    Patient Active Problem List   Diagnosis Date Noted  . GERD (gastroesophageal reflux disease) 10/14/2018  . Displaced fracture of left femoral neck (HCC) 02/26/2017  . Closed left hip fracture, initial encounter (HCC) 02/23/2017  . Mild renal insufficiency 02/23/2017  . Avascular necrosis of hip, right (HCC) 09/18/2016  . Encounter for screening colonoscopy 03/08/2015  . PUD (peptic ulcer disease)   . Dysphagia   . Abscess of right thumb 12/18/2014  . Abscess of thumb 12/15/2014  . Cellulitis and abscess 12/15/2014  . Obesity 10/31/2011  .  Dyslipidemia 10/31/2011  . At risk for diabetes mellitus 10/31/2011  . Migraine with aura 10/29/2011  . History of CVA in adulthood 10/28/2011  . Vertigo 10/28/2011  . Pain in limb 05/15/2011    Past Surgical History:  Procedure Laterality Date  . COLONOSCOPY N/A 03/22/2015   Procedure: COLONOSCOPY;  Surgeon: West Bali, MD;  Location: [facility] ENDO SUITE;  Service: Endoscopy;  Laterality: N/A;  1215  . ESOPHAGOGASTRODUODENOSCOPY N/A 12/22/2014   Dr. Darrick Penna: 1. dysphagia due to mid-esophageal web and uncontrolled reflux esophagitis. 2. small hiatal hernia 3. multiple small gastric and duodenal ulcers and mild duodenitis . Path with reactive gastropathy, negative H. pylori   . ESOPHAGOGASTRODUODENOSCOPY N/A 03/22/2015   Procedure: ESOPHAGOGASTRODUODENOSCOPY (EGD);  Surgeon: West Bali, MD;  Location: [facility] ENDO SUITE;  Service: Endoscopy;  Laterality: N/A;  . HERNIA REPAIR Left 2007  . INCISION AND DRAINAGE Right 12/18/2014   Procedure: INCISION AND DRAINAGE THUMB;  Surgeon: Vickki Hearing, MD;  Location: [facility] ORS;  Service: Orthopedics;  Laterality: Right;  . INCISION AND DRAINAGE Right 12/21/2014   Procedure: INCISION AND DRAINAGE RIGHT THUMB;  Surgeon: Vickki Hearing, MD;  Location: [facility] ORS;  Service: Orthopedics;  Laterality: Right;  . incision and drainage of thumb  12/18/14  . TOTAL HIP ARTHROPLASTY Right 09/18/2016  . TOTAL HIP ARTHROPLASTY Right 09/18/2016   Procedure: TOTAL HIP ARTHROPLASTY ANTERIOR APPROACH;  Surgeon: Samson Frederic, MD;  Location: [facility] OR;  Service: Orthopedics;  Laterality: Right;  .  TOTAL HIP ARTHROPLASTY Left 02/26/2017   Procedure: LEFT TOTAL HIP ARTHROPLASTY; ANTERIOR APPROACH;  Surgeon: Samson Frederic, MD;  Location: [facility] OR;  Service: Orthopedics;  Laterality: Left;       Family History  Problem Relation Age of Onset  . Cancer Mother   . Hyperlipidemia Mother   . Hypertension Mother   . Heart attack Mother   . Rheumatologic disease Mother   .  Cancer Father   . Heart disease Father 43       Heart Disease before age 33  . Heart attack Father   . Diabetes Sister   . Cancer Sister   . Hyperlipidemia Sister   . Hypertension Sister   . Colon cancer Neg Hx     Social History   Tobacco Use  . Smoking status: Former Smoker    Packs/day: 3.00    Years: 30.00    Pack years: 90.00    Types: Cigarettes    Quit date: 01/30/2005    Years since quitting: 14.6  . Smokeless tobacco: Never Used  Vaping Use  . Vaping Use: Never used  Substance Use Topics  . Alcohol use: Not Currently    Comment: occasionally  . Drug use: No    Home Medications Prior to Admission medications   Medication Sig Start Date End Date Taking? Authorizing Provider  aspirin 81 MG chewable tablet Chew 1 tablet (81 mg total) by mouth 2 (two) times daily. 02/27/17   Swinteck, Arlys John, MD  pantoprazole (PROTONIX) 40 MG tablet Take 1 tablet (40 mg total) by mouth daily. 10/14/18   Letta Median, PA-C    Allergies    Codeine and Hydrocodone  Review of Systems   Review of Systems  Constitutional: Negative for fever.  HENT: Negative for congestion and sore throat.   Eyes: Negative.   Respiratory: Negative for chest tightness and shortness of breath.   Cardiovascular: Negative for chest pain.  Gastrointestinal: Positive for abdominal distention, abdominal pain and diarrhea. Negative for nausea and vomiting.       Diarrhea last week, now resolved.  Genitourinary: Negative.   Musculoskeletal: Negative for arthralgias, joint swelling and neck pain.  Skin: Negative.  Negative for rash and wound.  Neurological: Negative for dizziness, weakness, light-headedness, numbness and headaches.  Psychiatric/Behavioral: Negative.     Physical Exam Updated Vital Signs BP 117/66   Pulse 86   Temp 98.6 F (37 C) (Oral)   Resp 14   Ht 5\' 10"  (1.778 m)   Wt 87.5 kg   SpO2 95%   BMI 27.69 kg/m   Physical Exam Vitals and nursing note reviewed.  Constitutional:        Appearance: He is well-developed.  HENT:     Head: Normocephalic and atraumatic.  Eyes:     Conjunctiva/sclera: Conjunctivae normal.  Cardiovascular:     Rate and Rhythm: Normal rate and regular rhythm.     Heart sounds: Normal heart sounds.  Pulmonary:     Effort: Pulmonary effort is normal.     Breath sounds: Normal breath sounds. No wheezing.  Abdominal:     General: Bowel sounds are normal.     Palpations: Abdomen is soft.     Tenderness: There is no abdominal tenderness.  Musculoskeletal:        General: Normal range of motion.     Cervical back: Normal range of motion.  Skin:    General: Skin is warm and dry.  Neurological:     Mental Status: He is  alert.     ED Results / Procedures / Treatments   Labs (all labs ordered are listed, but only abnormal results are displayed) Labs Reviewed  COMPREHENSIVE METABOLIC PANEL - Abnormal; Notable for the following components:      Result Value   Glucose, Bld 119 (*)    BUN 27 (*)    Creatinine, Ser 1.80 (*)    Total Bilirubin 2.2 (*)    GFR calc non Af Amer 40 (*)    GFR calc Af Amer 46 (*)    All other components within normal limits  URINALYSIS, ROUTINE W REFLEX MICROSCOPIC - Abnormal; Notable for the following components:   Ketones, ur 5 (*)    All other components within normal limits  CBC WITH DIFFERENTIAL/PLATELET - Abnormal; Notable for the following components:   WBC 15.2 (*)    Neutro Abs 12.6 (*)    Monocytes Absolute 1.2 (*)    All other components within normal limits  LIPASE, BLOOD    EKG None  Radiology No results found.  Procedures Procedures (including critical care time)  Medications Ordered in ED Medications  sodium chloride 0.9 % bolus 1,000 mL (has no administration in time range)  morphine 4 MG/ML injection 4 mg (4 mg Intravenous Given 09/17/19 1753)  ondansetron (ZOFRAN) injection 4 mg (4 mg Intravenous Given 09/17/19 1753)  sodium chloride 0.9 % bolus 500 mL (500 mLs Intravenous New  Bag/Given 09/17/19 1831)    ED Course  I have reviewed the triage vital signs and the nursing notes.  Pertinent labs & imaging results that were available during my care of the patient were reviewed by me and considered in my medical decision making (see chart for details).    MDM Rules/Calculators/A&P                          Pt with right lower quadrant pain pending CT imaging to assess for acute appy vs intestinal obstruction/diverticulitis.  Abdominal distention present, leukocytosis.    Lyndel Safe PA-C assumes pt care Final Clinical Impression(s) / ED Diagnoses Final diagnoses:  None    Rx / DC Orders ED Discharge Orders    None       Victoriano Lain 09/17/19 1915    Terrilee Files, MD 09/18/19 1227

## 2019-09-17 NOTE — ED Triage Notes (Signed)
Pt states he developed right lower quadrant pain about 2 weeks ago, pt rates pain 8/10 today and began vomiting last night.  Patient is being discharged from the Urgent Care and sent to the Emergency Department via private vehicle  . Per K. Avegno FNP , patient is in need of higher level of care due to possible appendicitis . Patient is aware and verbalizes understanding of plan of care.  Vitals:   09/17/19 1308  BP: (!) 145/90  Pulse: 87  Resp: 20  Temp: 98.7 F (37.1 C)  SpO2: 97%

## 2019-09-17 NOTE — ED Triage Notes (Signed)
Pain to RLQ that started x 2 weeks ago.  Pt from urgent care to r/o appendicitis.

## 2019-09-17 NOTE — ED Notes (Signed)
Pt drinking coke at this time.

## 2019-09-17 NOTE — Discharge Instructions (Addendum)
[facility] tends to be the preferred hospital by urologist.  If you develop fevers, are unable to pass urine, have uncontrolled pain or other need to seek emergency medical care for this you may come back to any pen however I would recommend considering [facility] as that is the preferred hospital for urology in general.  Your CT scan had incidental findings of aortic arteriosclerosis (calcium/plaque in your arteries )and diverticulosis.  These are not related to your symptoms today however you need to follow-up with your primary care doctor about these.  Today you received medications that may make you sleepy or impair your ability to make decisions.  For the next 24 hours please do not drive, operate heavy machinery, care for a small child with out another adult present, or perform any activities that may cause harm to you or someone else if you were to fall asleep or be impaired.   You are being prescribed a medication which may make you sleepy. Please follow up of listed precautions for at least 24 hours after taking one dose.

## 2019-09-17 NOTE — ED Provider Notes (Signed)
I assumed care of patient from previous team at shift change, please see their note for full H&P.  Briefly patient is here for evaluation of possible appendicitis due to right lower quadrant pain on abdominal exam.  His pain is been present for about a week.  Physical Exam  BP 117/66   Pulse 86   Temp 98.6 F (37 C) (Oral)   Resp 14   Ht 5\' 10"  (1.778 m)   Wt 87.5 kg   SpO2 95%   BMI 27.69 kg/m   Patient is awake and alert, he is laying on bed.  Appears uncomfortable. Speech is not slurred.  He answers questions appropriately.  ED Course/Procedures     Procedures  CT ABDOMEN PELVIS WO CONTRAST  Result Date: 09/17/2019 CLINICAL DATA:  Right lower quadrant pain for 2 weeks. EXAM: CT ABDOMEN AND PELVIS WITHOUT CONTRAST TECHNIQUE: Multidetector CT imaging of the abdomen and pelvis was performed following the standard protocol without IV contrast. COMPARISON:  None. FINDINGS: Lower chest: No acute findings. Hepatobiliary: No mass visualized on this unenhanced exam. Gallbladder is unremarkable. No evidence of biliary ductal dilatation. Pancreas: No mass or inflammatory process visualized on this unenhanced exam. Spleen:  Within normal limits in size. Adrenals/Urinary tract: Mild right hydronephrosis and perinephric stranding is seen, due to a 4 mm calculus in the proximal right ureter. Limited visualization of distal ureters and bladder due to severe artifact from bilateral hip prostheses. Stomach/Bowel: No evidence of obstruction, inflammatory process, or abnormal fluid collections. Normal appendix visualized. Diverticulosis is seen mainly involving the sigmoid colon, however there is no evidence of diverticulitis. Vascular/Lymphatic: No pathologically enlarged lymph nodes identified. No evidence of abdominal aortic aneurysm. Aortic atherosclerosis noted. Reproductive: Poorly visualized due to severe artifact from bilateral hip prostheses. Other: Postop changes from previous left inguinal hernia  repair. No evidence of recurrent hernia. Musculoskeletal:  No suspicious bone lesions identified. IMPRESSION: Mild right hydronephrosis and perinephric stranding due to 4 mm proximal right ureteral calculus. Colonic diverticulosis, without radiographic evidence of diverticulitis. Aortic Atherosclerosis (ICD10-I70.0). Electronically Signed   By: 09/19/2019 M.D.   On: 09/17/2019 19:23    Medications  morphine 4 MG/ML injection 4 mg (4 mg Intravenous Given 09/17/19 1753)  ondansetron (ZOFRAN) injection 4 mg (4 mg Intravenous Given 09/17/19 1753)  sodium chloride 0.9 % bolus 500 mL (500 mLs Intravenous New Bag/Given 09/17/19 1831)  sodium chloride 0.9 % bolus 1,000 mL (1,000 mLs Intravenous New Bag/Given 09/17/19 1914)  HYDROmorphone (DILAUDID) injection 1 mg (1 mg Intravenous Given 09/17/19 1950)     MDM  Plan to follow-up on CT results.  CT scan shows right hydronephrosis due to a 34mm proximal ureteral calculis with diverticulosis without diverticulitis and aortic arteriosclerosis.  This was discussed with the patient and family in room.  Pain was treated in the emergency room with morphine without significant relief, followed by Dilaudid which controlled his symptoms.  He was given Zofran. His creatinine is up minimally, he was given a full liter of fluids despite having a renal stone due to getting contrast.  He has not taken any Metformin.  He is given a prepack prescription for Zofran and oxycodone here along with prescriptions for oxycodone and Flomax and Zofran at home.  Out patient follow up with urology.  No evidence of infection on UA.   Return precautions were discussed with patient who states their understanding.  At the time of discharge patient denied any unaddressed complaints or concerns.  Patient is agreeable for discharge  home.  Note: Portions of this report may have been transcribed using voice recognition software. Every effort was made to ensure accuracy; however, inadvertent  computerized transcription errors may be present        Norman Clay 09/17/19 2127    Terrilee Files, MD 09/18/19 1227

## 2019-09-18 MED FILL — Oxycodone w/ Acetaminophen Tab 5-325 MG: ORAL | Qty: 6 | Status: AC

## 2019-10-01 ENCOUNTER — Encounter: Payer: Self-pay | Admitting: Internal Medicine

## 2019-11-03 ENCOUNTER — Other Ambulatory Visit: Payer: Self-pay | Admitting: Gastroenterology

## 2019-11-03 DIAGNOSIS — K219 Gastro-esophageal reflux disease without esophagitis: Secondary | ICD-10-CM

## 2020-02-21 IMAGING — DX DG HIP (WITH OR WITHOUT PELVIS) 1V*L*
3 series · 3 of 3 positions shown · non-contrast
Comparison: None.

CLINICAL DATA: Left hip dislocation

EXAM:
DG HIP (WITH OR WITHOUT PELVIS) 1V*L*

[hip ap (1 of 2)]
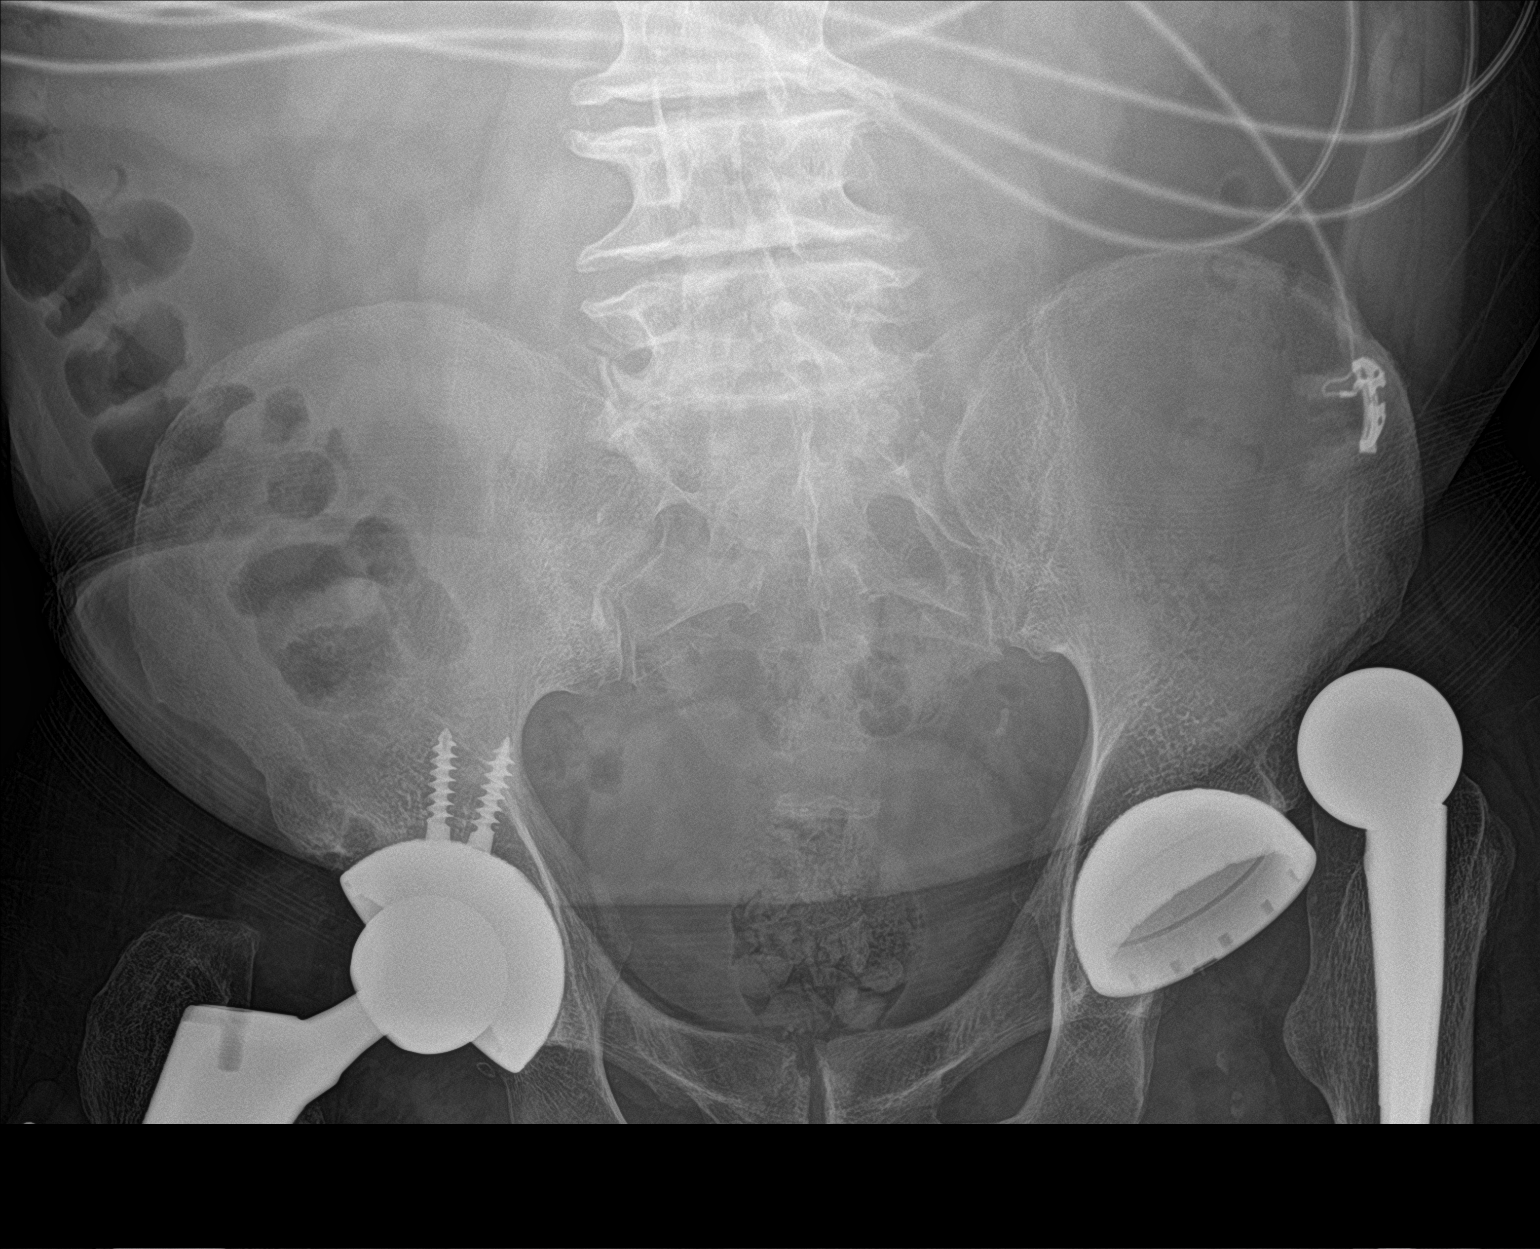

[hip lat]
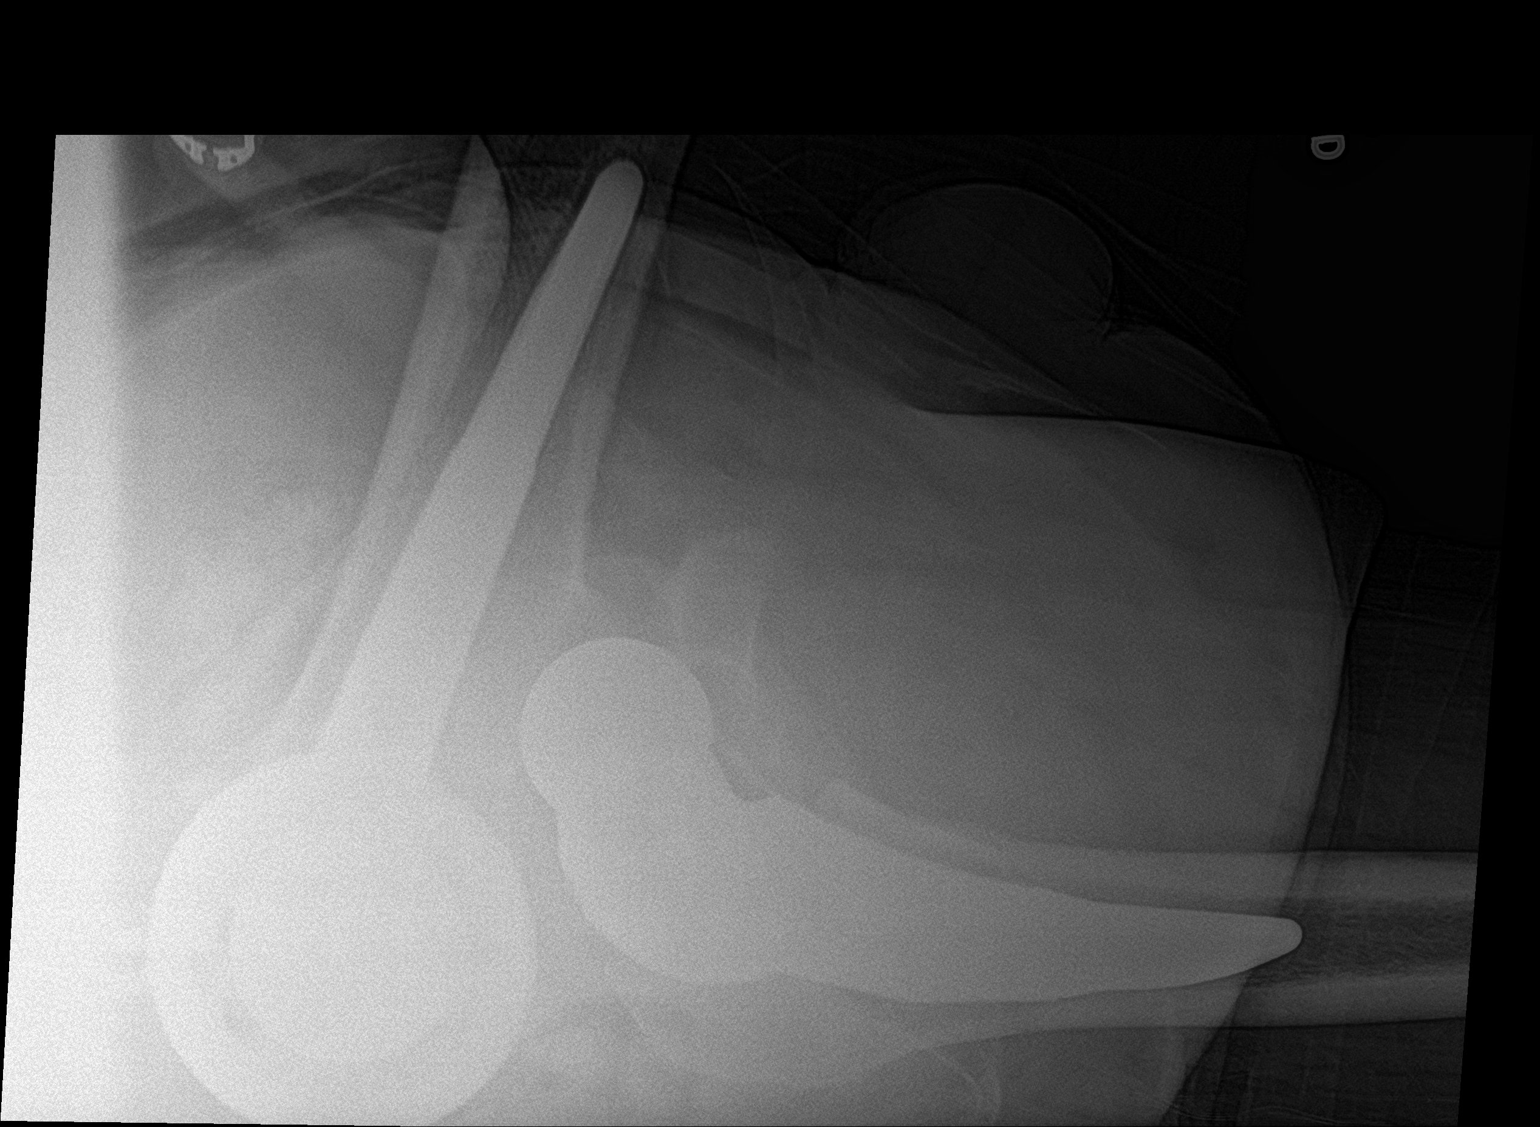

[hip ap (2 of 2)]
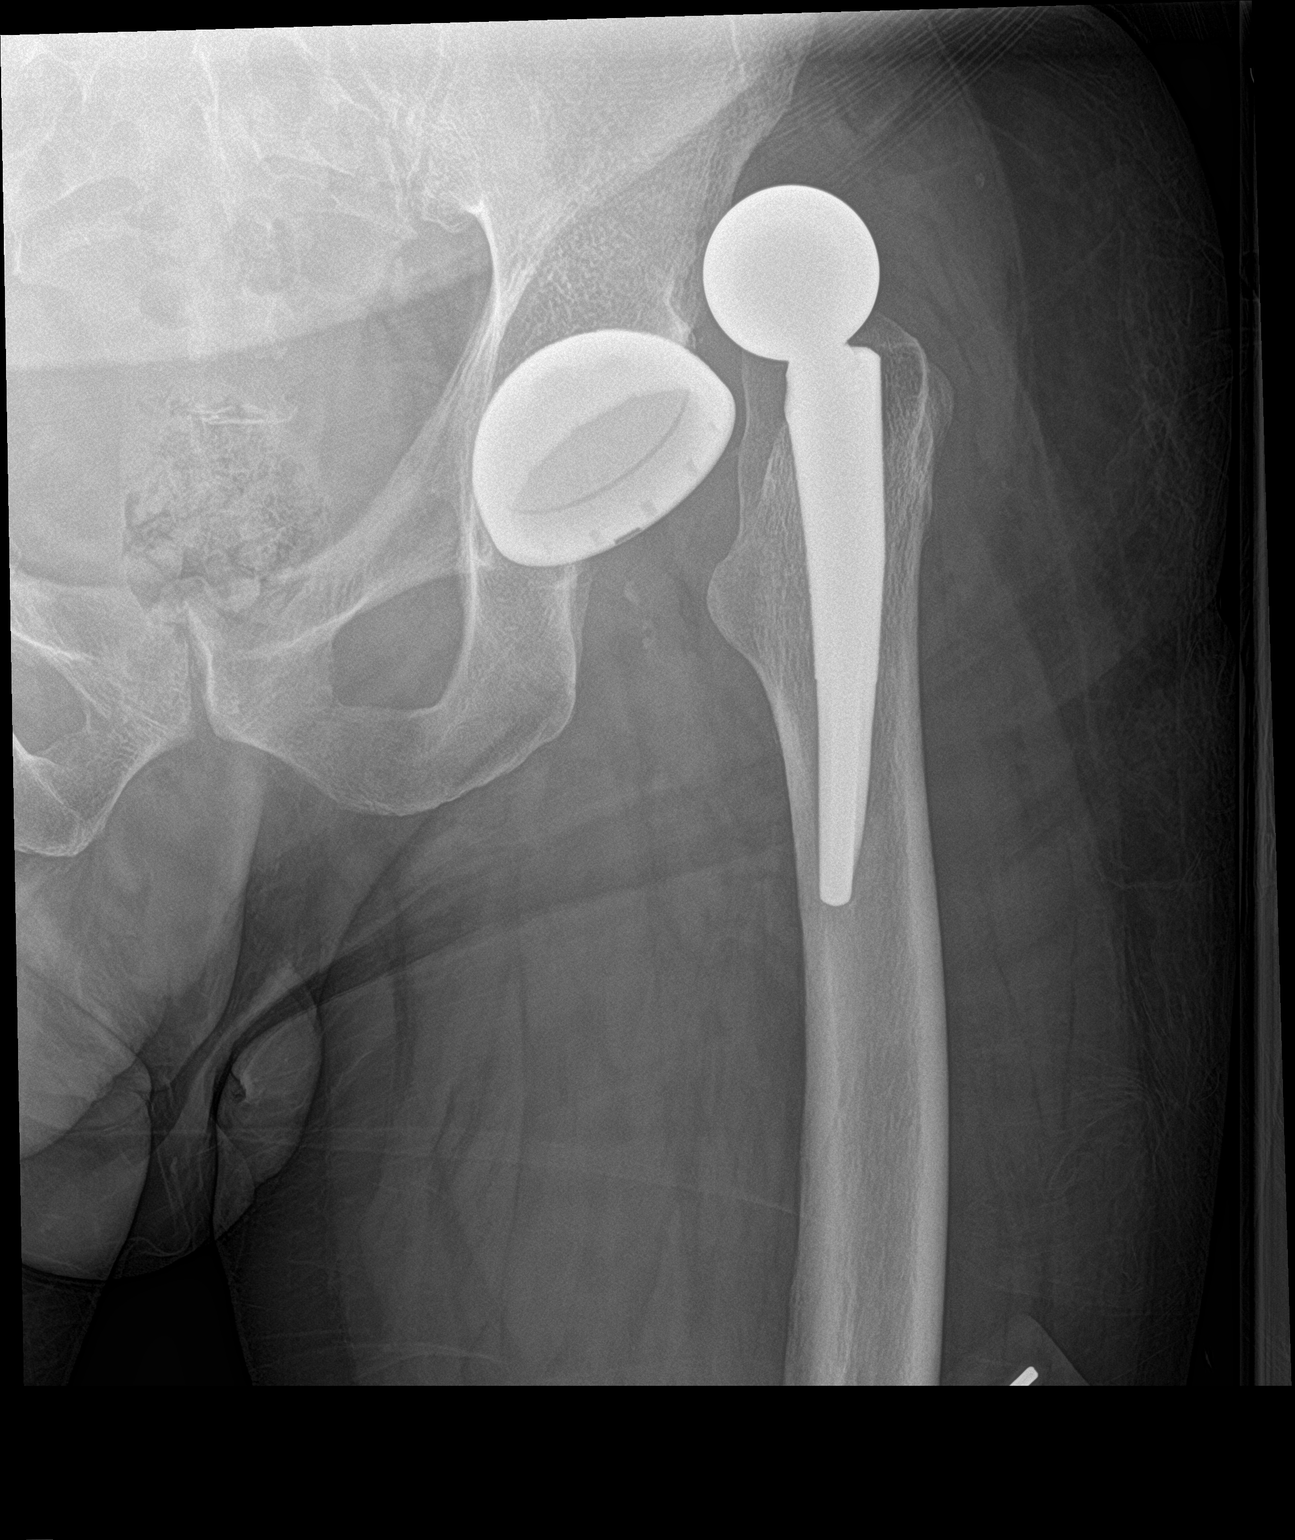

[3 of 3 positions shown; findings below may reference images not displayed]

FINDINGS: Total left hip arthroplasty with superior dislocation. No acute
fracture.

Right total hip arthroplasty without failure or complication.

No aggressive osseous lesion.
IMPRESSION: Left total hip arthroplasty. Superior dislocation of the left
femoral head relative to the acetabulum.

## 2020-02-21 IMAGING — CR DG HIP (WITH OR WITHOUT PELVIS) 1V PORT*L*
1 series · 2 of 2 positions shown · non-contrast
Comparison: Radiographs of same day.

CLINICAL DATA: Status post reduction of left hip dislocation.

EXAM:
DG HIP (WITH OR WITHOUT PELVIS) 1V PORT LEFT

[Series 2: ap · 0.17mm/px · 2 of 2 slices shown]
[im 1/2]
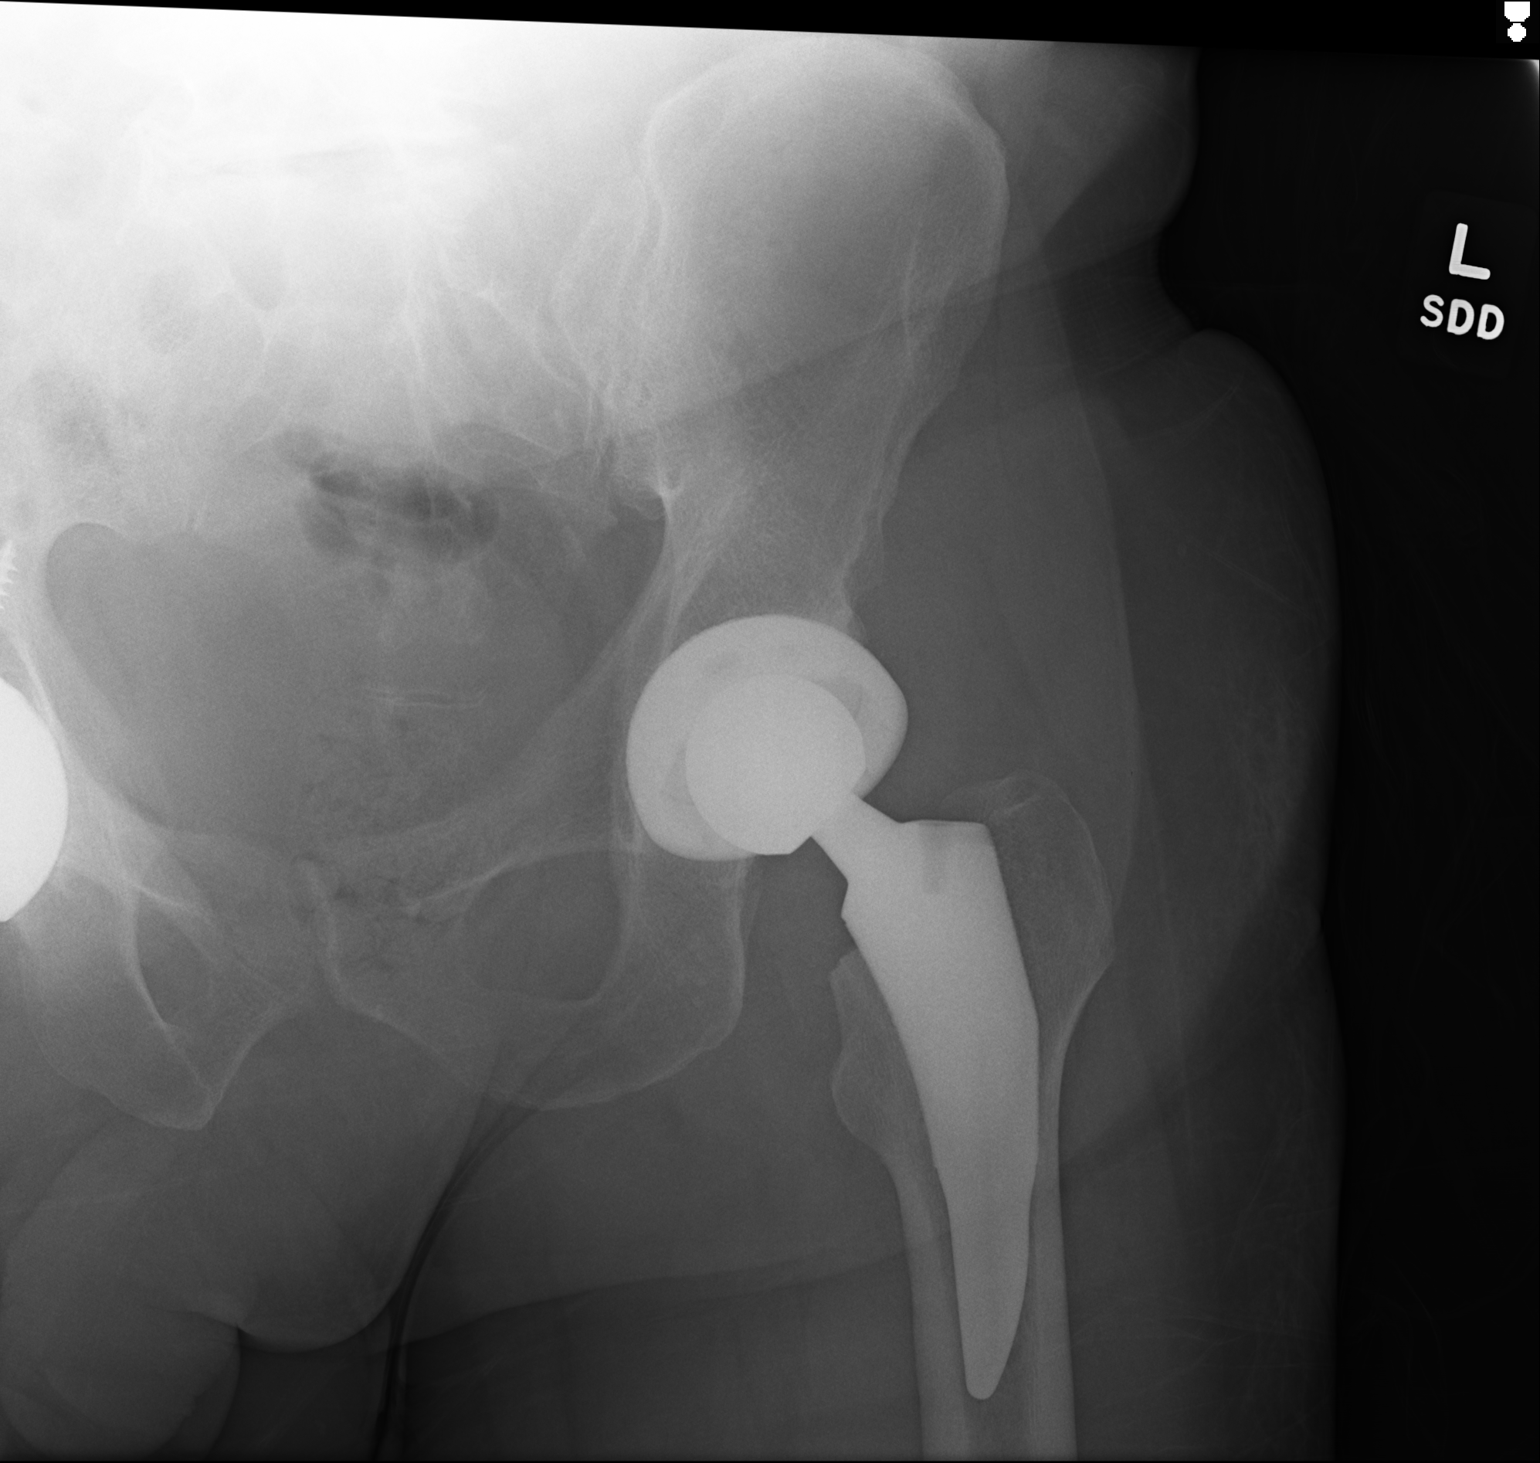
[im 2/2]
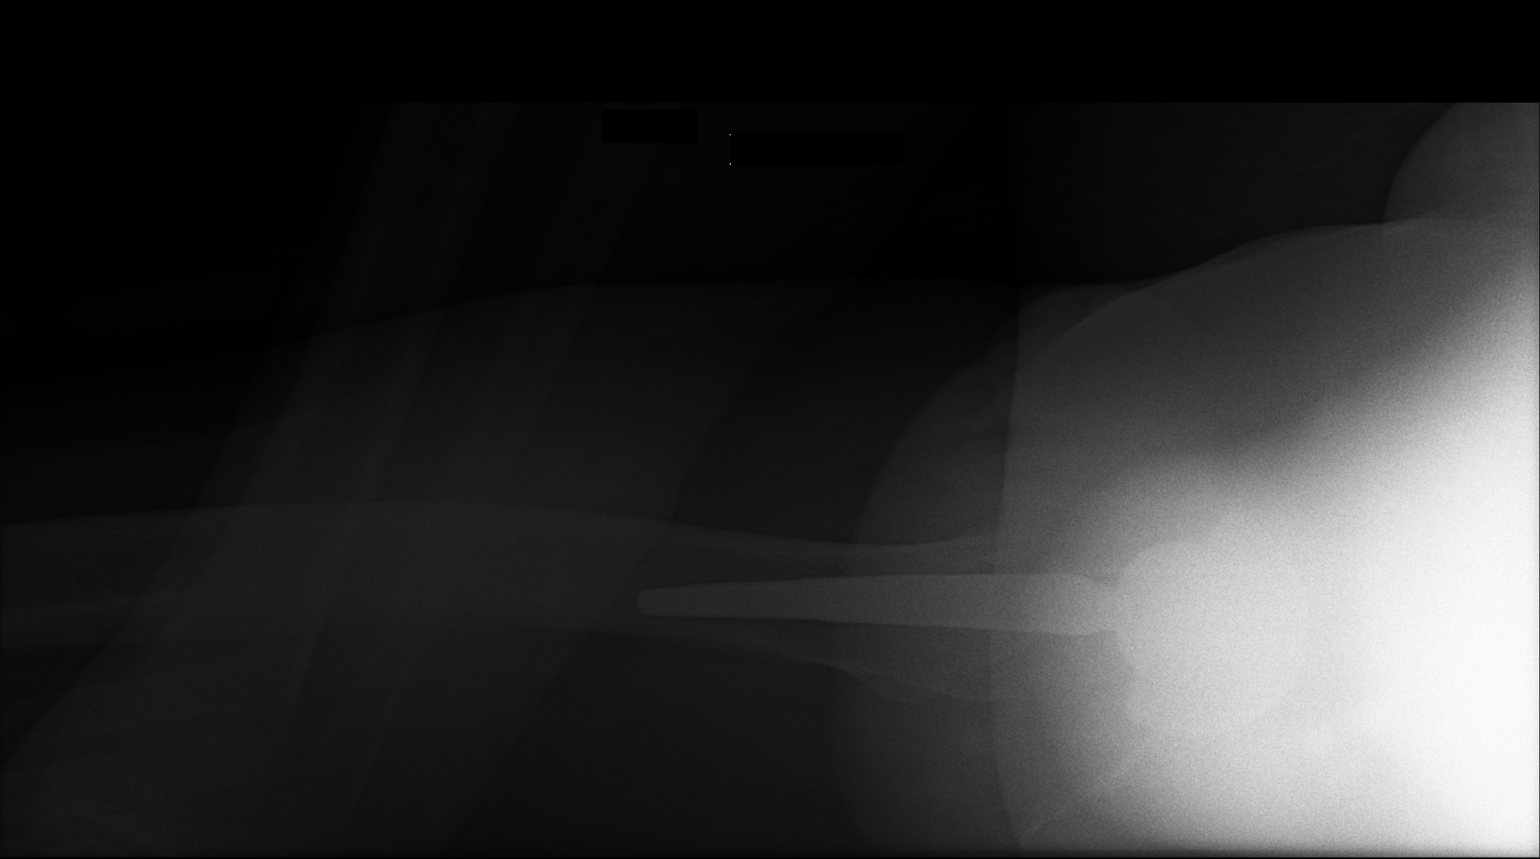

[2 of 2 positions shown; findings below may reference images not displayed]

FINDINGS: Previously described dislocation involving the left hip prosthesis
has been successfully reduced. No fracture is noted.
IMPRESSION: Successful reduction of previously described left hip prosthesis
dislocation.

## 2020-03-09 ENCOUNTER — Other Ambulatory Visit: Payer: Self-pay | Admitting: Nurse Practitioner

## 2020-03-09 DIAGNOSIS — K219 Gastro-esophageal reflux disease without esophagitis: Secondary | ICD-10-CM

## 2020-03-10 ENCOUNTER — Encounter: Payer: Self-pay | Admitting: Internal Medicine

## 2020-03-10 NOTE — Telephone Encounter (Signed)
Needs office visit prior to additional refills.  

## 2020-04-01 IMAGING — DX DG CHEST 2V
2 series · 2 of 2 positions shown · non-contrast
Comparison: Chest x-ray dated 02/23/2017.

CLINICAL DATA: Trach cough and shortness of breath since [REDACTED].
Former smoker.

EXAM:
CHEST - 2 VIEW

[chest pa]
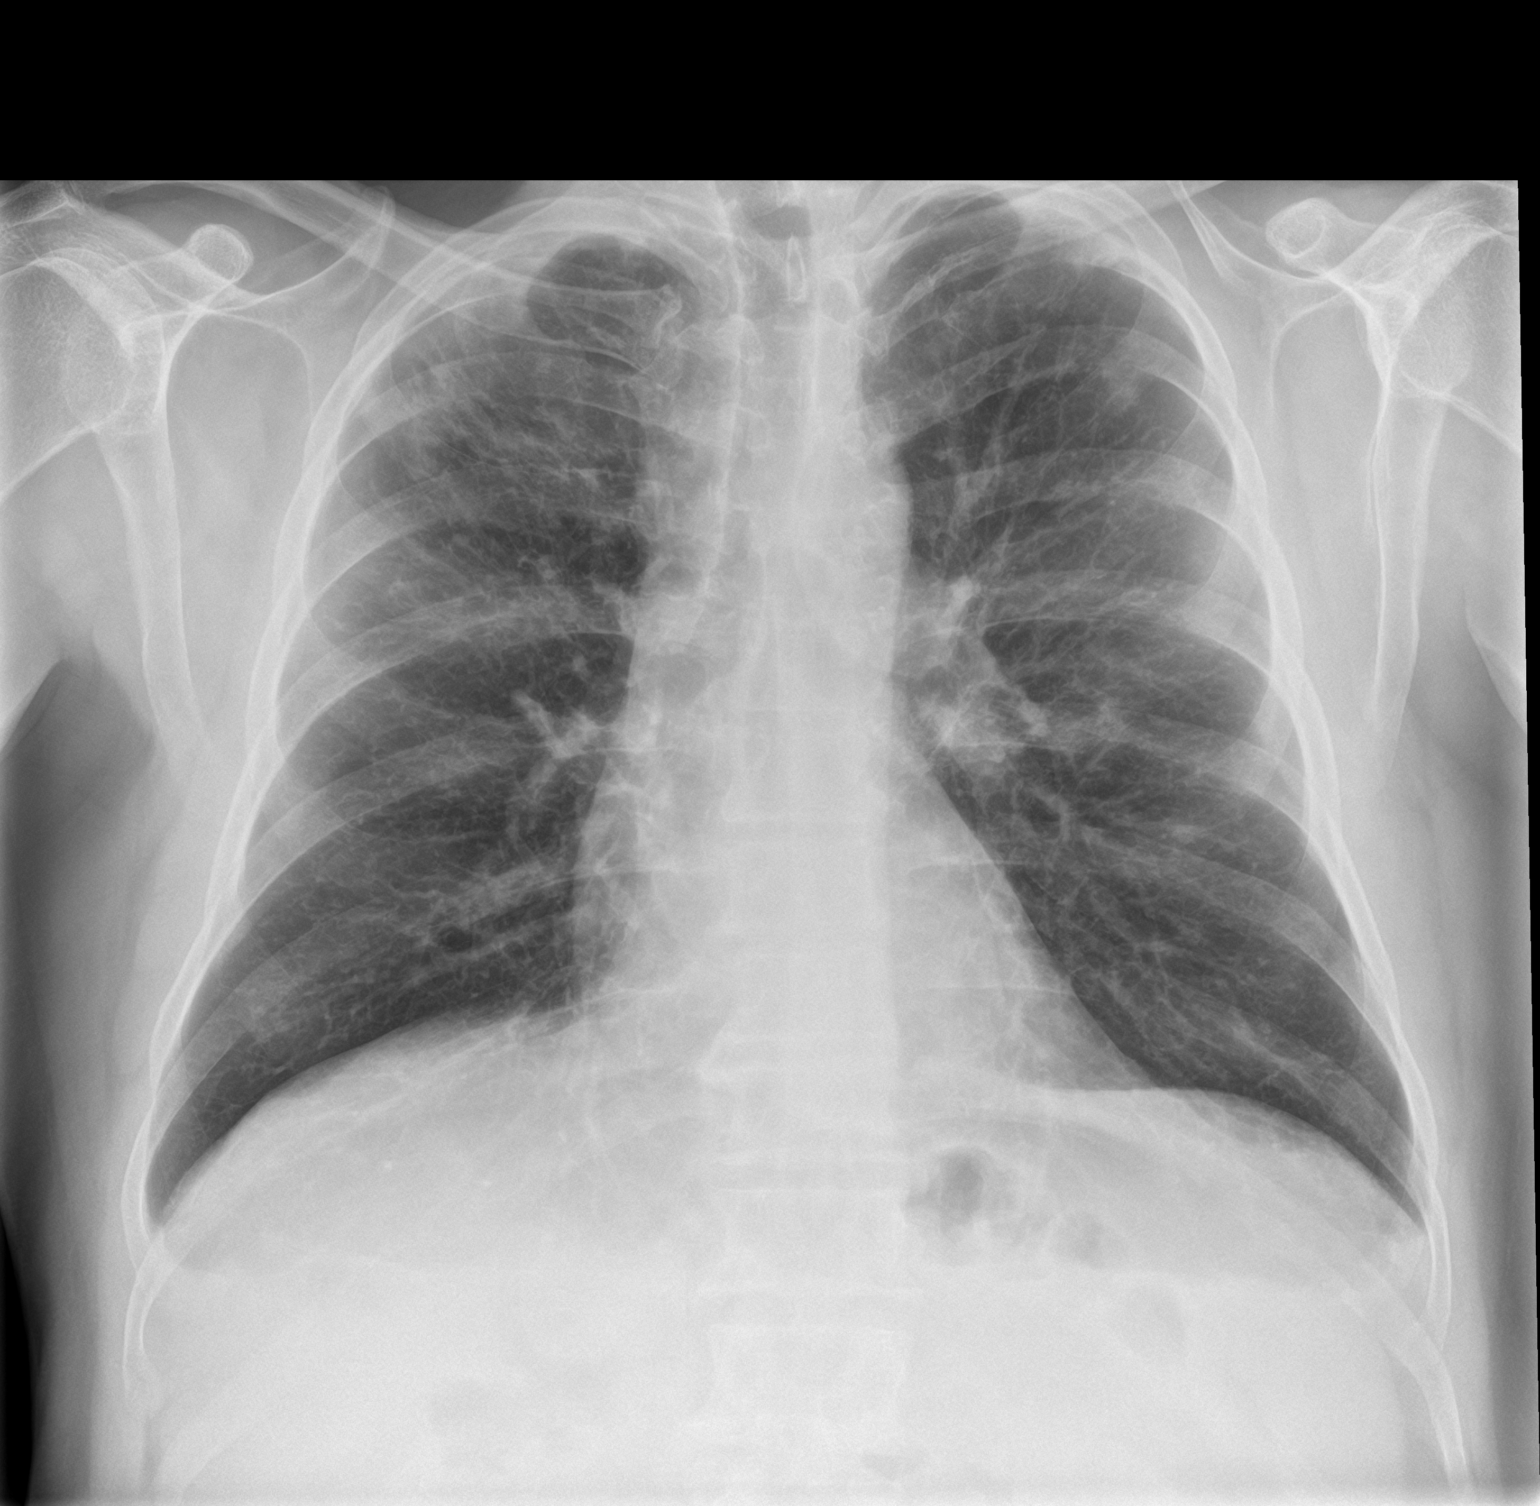

[chest lat]
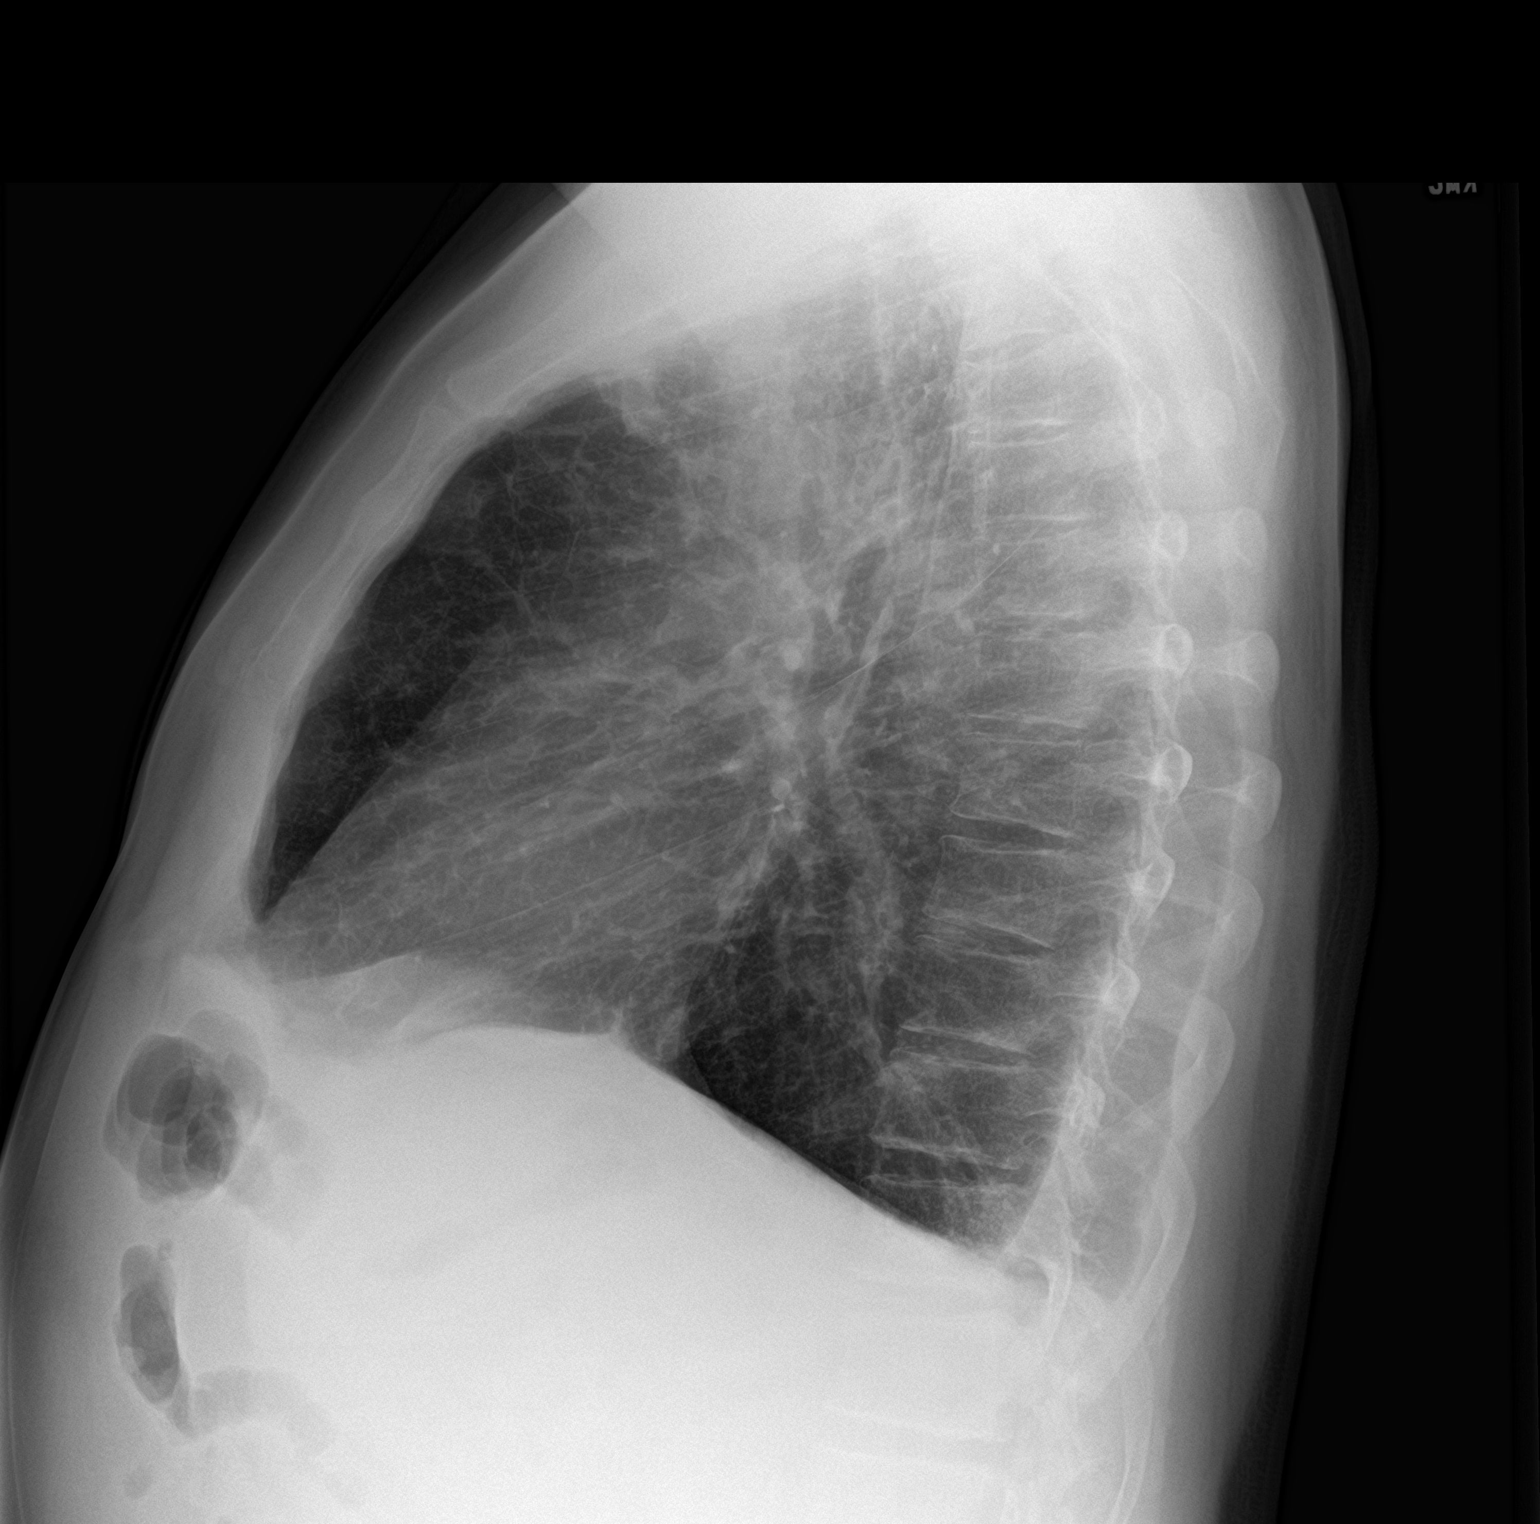

[2 of 2 positions shown; findings below may reference images not displayed]

FINDINGS: Heart size and mediastinal contours are normal. Streaky perihilar
and upper lobe opacities bilaterally, new compared to the previous
study. Lower lungs are relatively clear. No pleural effusion or
pneumothorax seen. Osseous structures about the chest are
unremarkable. No acute or suspicious osseous finding.
IMPRESSION: Streaky perihilar and upper lobe opacities bilaterally, new compared
to recent chest x-ray of 02/23/2017. Differential includes atypical
pneumonias such as viral or fungal, interstitial pneumonias, edema
related to volume overload/CHF, hypersensitivity pneumonitis, and
respiratory bronchiolitis. Recommend follow-up chest x-ray to ensure
resolution. If persists, would consider chest CT for further
characterization.

## 2020-04-21 ENCOUNTER — Ambulatory Visit: Payer: BC Managed Care – PPO | Admitting: Internal Medicine

## 2020-04-21 ENCOUNTER — Encounter: Payer: Self-pay | Admitting: Internal Medicine

## 2020-04-21 ENCOUNTER — Other Ambulatory Visit: Payer: Self-pay

## 2020-04-21 VITALS — BP 156/86 | HR 84 | Temp 97.3°F | Ht 68.0 in | Wt 200.6 lb

## 2020-04-21 DIAGNOSIS — K219 Gastro-esophageal reflux disease without esophagitis: Secondary | ICD-10-CM

## 2020-04-21 DIAGNOSIS — Z1211 Encounter for screening for malignant neoplasm of colon: Secondary | ICD-10-CM

## 2020-04-21 MED ORDER — PANTOPRAZOLE SODIUM 40 MG PO TBEC: 40.0000 mg | DELAYED_RELEASE_TABLET | Freq: Every day | ORAL | 3 refills | Status: DC

## 2020-04-21 NOTE — Patient Instructions (Addendum)
Continue on pantoprazole daily for your chronic reflux.  If you start to have breakthrough symptoms, or difficulty swallowing, please call the office and set up a follow-up appointment.  Otherwise we will see you in 1 year.  We will plan on screening colonoscopy in 2027.  Lifestyle and home remedies TO MANAGE REFLUX/HEARTBURN    You may eliminate or reduce the frequency of heartburn by making the following lifestyle changes:    Control your weight. Being overweight is a major risk factor for heartburn and GERD. Excess pounds put pressure on your abdomen, pushing up your stomach and causing acid to back up into your esophagus.     Eat smaller meals. 4 TO 6 MEALS A DAY. This reduces pressure on the lower esophageal sphincter, helping to prevent the valve from opening and acid from washing back into your esophagus.      Loosen your belt. Clothes that fit tightly around your waist put pressure on your abdomen and the lower esophageal sphincter.      Eliminate heartburn triggers. Everyone has specific triggers. Common triggers such as fatty or fried foods, spicy food, tomato sauce, carbonated beverages, alcohol, chocolate, mint, garlic, onion, caffeine and nicotine may make heartburn worse.     Avoid stooping or bending. Tying your shoes is OK. Bending over for longer periods to weed your garden isn't, especially soon after eating.     Don't lie down after a meal. Wait at least three to four hours after eating before going to bed, and don't lie down right after eating.    At [facility] we value your feedback. You may receive a survey about your visit today. Please share your experience as we strive to create trusting relationships with our patients to provide genuine, compassionate, quality care.  We appreciate your understanding and patience as we review any laboratory studies, imaging, and other diagnostic tests that are ordered as we care for you. Our office policy is 5  business days for review of these results, and any emergent or urgent results are addressed in a timely manner for your best interest. If you do not hear from our office in 1 week, please contact us.   We also encourage the use of MyChart, which contains your medical information for your review as well. If you are not enrolled in this feature, an access code is on this after visit summary for your convenience. Thank you for allowing [place] to be involved in your care.  It was great to see you today!  I hope you have a great rest of your spring!!    Hennie Duos. Marletta Lor, D.O. Gastroenterology and Hepatology [facility]

## 2020-04-21 NOTE — Progress Notes (Signed)
Referring Provider: Avis Epley, PA* Primary Care Physician:  Avis Epley, PA-C Primary GI:  Dr. Marletta Lor  Chief Complaint  Patient presents with  . Follow-up    Genella Rife, does well on medicine    HPI:   Cory Roberts is a 60 y.o. male who presents to clinic today for follow-up visit.  Last seen in 2020.  Has chronic reflux for which he takes pantoprazole daily.  States his symptoms are well controlled as long as he takes his medication.  No dysphagia or odynophagia.  No melena hematochezia.  Last EGD 2017 showed a stricture as well as peptic duodenitis.  Biopsies negative for H. pylori.  Colonoscopy at the same time showed mild diverticulosis, no polyps.  Patient denies any family history of colorectal malignancy.  Past Medical History:  Diagnosis Date  . Arthritis Feb. 2013   Gout- Right foot  . CVA (cerebral infarction)   . GERD (gastroesophageal reflux disease)   . Gout   . Headache    Migraine Headaches  . Raynaud's syndrome   . Stroke [facility]) 2011    Past Surgical History:  Procedure Laterality Date  . COLONOSCOPY N/A 03/22/2015   Procedure: COLONOSCOPY;  Surgeon: West Bali, MD;  [facility];  Service: Endoscopy;  Laterality: N/A;  1215  . ESOPHAGOGASTRODUODENOSCOPY N/A 12/22/2014   Dr. Darrick Penna: 1. dysphagia due to mid-esophageal web and uncontrolled reflux esophagitis. 2. small hiatal hernia 3. multiple small gastric and duodenal ulcers and mild duodenitis . Path with reactive gastropathy, negative H. pylori   . ESOPHAGOGASTRODUODENOSCOPY N/A 03/22/2015   Procedure: ESOPHAGOGASTRODUODENOSCOPY (EGD);  Surgeon: West Bali, MD;  [facility]: [facility];  Service: Endoscopy;  Laterality: N/A;  . HERNIA REPAIR Left 2007  . INCISION AND DRAINAGE Right 12/18/2014   Procedure: INCISION AND DRAINAGE THUMB;  Surgeon: Vickki Hearing, MD;  Location: [facility];  Service: Orthopedics;  Laterality: Right;  . INCISION AND DRAINAGE Right 12/21/2014    Procedure: INCISION AND DRAINAGE RIGHT THUMB;  Surgeon: Vickki Hearing, MD;  Location: [facility];  Service: Orthopedics;  Laterality: Right;  . incision and drainage of thumb  12/18/14  . TOTAL HIP ARTHROPLASTY Right 09/18/2016  . TOTAL HIP ARTHROPLASTY Right 09/18/2016   Procedure: TOTAL HIP ARTHROPLASTY ANTERIOR APPROACH;  Surgeon: Samson Frederic, MD;  Location: [facility];  Service: Orthopedics;  Laterality: Right;  . TOTAL HIP ARTHROPLASTY Left 02/26/2017   Procedure: LEFT TOTAL HIP ARTHROPLASTY; ANTERIOR APPROACH;  Surgeon: Samson Frederic, MD;  Location: [facility] OR;  Service: Orthopedics;  Laterality: Left;    Current Outpatient Medications  Medication Sig Dispense Refill  . aspirin 81 MG chewable tablet Chew 1 tablet (81 mg total) by mouth 2 (two) times daily. (Patient not taking: Reported on 04/21/2020) 60 tablet 1  . ondansetron (ZOFRAN ODT) 4 MG disintegrating tablet Take 1 tablet (4 mg total) by mouth every 8 (eight) hours as needed for nausea or vomiting. (Patient not taking: Reported on 04/21/2020) 10 tablet 0  . ondansetron (ZOFRAN) 4 MG tablet Take 1 tablet (4 mg total) by mouth every 8 (eight) hours as needed for nausea or vomiting. (Patient not taking: Reported on 04/21/2020) 4 tablet 0  . oxyCODONE-acetaminophen (PERCOCET/ROXICET) 5-325 MG tablet Take 1 tablet by mouth every 6 (six) hours as needed for severe pain. (Patient not taking: Reported on 04/21/2020) 6 tablet 0  . oxyCODONE-acetaminophen (PERCOCET/ROXICET) 5-325 MG tablet Take 1 tablet by mouth every 6 (six) hours as needed for severe  pain. (Patient not taking: Reported on 04/21/2020) 5 tablet 0  . pantoprazole (PROTONIX) 40 MG tablet Take 1 tablet (40 mg total) by mouth daily. 90 tablet 3  . tamsulosin (FLOMAX) 0.4 MG CAPS capsule Take 1 capsule (0.4 mg total) by mouth daily. (Patient not taking: Reported on 04/21/2020) 14 capsule 0   No current facility-administered medications for this visit.    Allergies as of 04/21/2020 - Review  Complete 04/21/2020  Allergen Reaction Noted  . Codeine Nausea And Vomiting 05/08/2011  . Hydrocodone Nausea Only 10/28/2011    Family History  Problem Relation Age of Onset  . Cancer Mother   . Hyperlipidemia Mother   . Hypertension Mother   . Heart attack Mother   . Rheumatologic disease Mother   . Cancer Father   . Heart disease Father 21       Heart Disease before age 52  . Heart attack Father   . Diabetes Sister   . Cancer Sister   . Hyperlipidemia Sister   . Hypertension Sister   . Colon cancer Neg Hx     Social History   Socioeconomic History  . Marital status: Married    Spouse name: Not on file  . Number of children: Not on file  . Years of education: Not on file  . Highest education level: Not on file  Occupational History  . Not on file  Tobacco Use  . Smoking status: Former Smoker    Packs/day: 3.00    Years: 30.00    Pack years: 90.00    Types: Cigarettes    Quit date: 01/30/2005    Years since quitting: 15.2  . Smokeless tobacco: Never Used  Vaping Use  . Vaping Use: Never used  Substance and Sexual Activity  . Alcohol use: Not Currently  . Drug use: No  . Sexual activity: Not on file  Other Topics Concern  . Not on file  Social History Narrative  . Not on file   Social Determinants of Health   Financial Resource Strain: Not on file  Food Insecurity: Not on file  Transportation Needs: Not on file  Physical Activity: Not on file  Stress: Not on file  Social Connections: Not on file    Subjective: Review of Systems  Constitutional: Negative for chills and fever.  HENT: Negative for congestion and hearing loss.   Eyes: Negative for blurred vision and double vision.  Respiratory: Negative for cough and shortness of breath.   Cardiovascular: Negative for chest pain and palpitations.  Gastrointestinal: Negative for abdominal pain, blood in stool, constipation, diarrhea, heartburn, melena and vomiting.  Genitourinary: Negative for dysuria  and urgency.  Musculoskeletal: Negative for joint pain and myalgias.  Skin: Negative for itching and rash.  Neurological: Negative for dizziness and headaches.  Psychiatric/Behavioral: Negative for depression. The patient is not nervous/anxious.      Objective: BP (!) 156/86   Pulse 84   Temp (!) 97.3 F (36.3 C) (Temporal)   Ht 5\' 8"  (1.727 m)   Wt 200 lb 9.6 oz (91 kg)   BMI 30.50 kg/m  Physical Exam Constitutional:      Appearance: Normal appearance.  HENT:     Head: Normocephalic and atraumatic.  Eyes:     Extraocular Movements: Extraocular movements intact.     Conjunctiva/sclera: Conjunctivae normal.  Cardiovascular:     Rate and Rhythm: Normal rate and regular rhythm.  Pulmonary:     Effort: Pulmonary effort is normal.     Breath  sounds: Normal breath sounds.  Abdominal:     General: Bowel sounds are normal.     Palpations: Abdomen is soft.  Musculoskeletal:        General: Normal range of motion.     Cervical back: Normal range of motion and neck supple.  Skin:    General: Skin is warm.  Neurological:     General: No focal deficit present.     Mental Status: He is alert and oriented to person, place, and time.  Psychiatric:        Mood and Affect: Mood normal.        Behavior: Behavior normal.      Assessment: *GERD-well-controlled on pantoprazole daily *Colon cancer screening  Plan: Patient's GERD well-controlled on pantoprazole daily.  We will send in year supply of refills today.  I have also printed off home practices to decrease reflux symptoms.  Colonoscopy recall 2027.  Patient follow-up in 1 year or sooner if needed peer  04/21/2020 8:42 AM   Disclaimer: This note was dictated with voice recognition software. Similar sounding words can inadvertently be transcribed and may not be corrected upon review.

## 2020-04-23 DIAGNOSIS — Z1331 Encounter for screening for depression: Secondary | ICD-10-CM | POA: Diagnosis not present

## 2020-04-23 DIAGNOSIS — Z1389 Encounter for screening for other disorder: Secondary | ICD-10-CM | POA: Diagnosis not present

## 2020-04-23 DIAGNOSIS — E782 Mixed hyperlipidemia: Secondary | ICD-10-CM | POA: Diagnosis not present

## 2020-04-23 DIAGNOSIS — M1A00X0 Idiopathic chronic gout, unspecified site, without tophus (tophi): Secondary | ICD-10-CM | POA: Diagnosis not present

## 2020-04-23 DIAGNOSIS — R7309 Other abnormal glucose: Secondary | ICD-10-CM | POA: Diagnosis not present

## 2020-04-23 DIAGNOSIS — R600 Localized edema: Secondary | ICD-10-CM | POA: Diagnosis not present

## 2020-04-23 DIAGNOSIS — Z13220 Encounter for screening for lipoid disorders: Secondary | ICD-10-CM | POA: Diagnosis not present

## 2020-04-23 DIAGNOSIS — E663 Overweight: Secondary | ICD-10-CM | POA: Diagnosis not present

## 2020-04-23 DIAGNOSIS — Z6828 Body mass index (BMI) 28.0-28.9, adult: Secondary | ICD-10-CM | POA: Diagnosis not present

## 2020-08-01 ENCOUNTER — Ambulatory Visit
Admission: EM | Admit: 2020-08-01 | Discharge: 2020-08-01 | Disposition: A | Discharge status: Home / Self Care | Payer: BC Managed Care – PPO | Attending: Family Medicine | Admitting: Family Medicine

## 2020-08-01 ENCOUNTER — Other Ambulatory Visit: Payer: Self-pay

## 2020-08-01 DIAGNOSIS — J22 Unspecified acute lower respiratory infection: Secondary | ICD-10-CM | POA: Diagnosis not present

## 2020-08-01 DIAGNOSIS — R059 Cough, unspecified: Secondary | ICD-10-CM

## 2020-08-01 MED ORDER — BENZONATATE 100 MG PO CAPS: 200.0000 mg | ORAL_CAPSULE | Freq: Two times a day (BID) | ORAL | 0 refills | Status: DC | PRN

## 2020-08-01 MED ORDER — DOXYCYCLINE HYCLATE 100 MG PO CAPS: 100.0000 mg | ORAL_CAPSULE | Freq: Two times a day (BID) | ORAL | 0 refills | Status: AC

## 2020-08-01 MED ORDER — PREDNISONE 20 MG PO TABS: 20.0000 mg | ORAL_TABLET | Freq: Every day | ORAL | 0 refills | Status: AC

## 2020-08-01 NOTE — ED Provider Notes (Signed)
[facility]    CSN: [id number] Arrival date & time: 08/01/20  1111      History   Chief Complaint Chief Complaint  Patient presents with   Cough   Nasal Congestion    HPI Cory Roberts is a 60 y.o. male.   HPI Patient presents today with a concern for 3 days of productive cough along with congestion.  Symptoms worsen over the night.  He endorses some mild shortness of breath.  Has a history of pneumonia.  He denies any chest pain but endorses some chest heaviness.  He is afebrile.  Past Medical History:  Diagnosis Date   Arthritis Feb. 2013   Gout- Right foot   CVA (cerebral infarction)    GERD (gastroesophageal reflux disease)    Gout    Headache    Migraine Headaches   Raynaud's syndrome    Stroke [facility]) 2011    Patient Active Problem List   Diagnosis Date Noted   Aortic atherosclerosis (HCC) 09/17/2019   Diverticulosis 09/17/2019   GERD (gastroesophageal reflux disease) 10/14/2018   Displaced fracture of left femoral neck (HCC) 02/26/2017   Closed left hip fracture, initial encounter (HCC) 02/23/2017   Mild renal insufficiency 02/23/2017   Avascular necrosis of hip, right (HCC) 09/18/2016   Encounter for screening colonoscopy 03/08/2015   PUD (peptic ulcer disease)    Dysphagia    Abscess of right thumb 12/18/2014   Abscess of thumb 12/15/2014   Cellulitis and abscess 12/15/2014   Obesity 10/31/2011   Dyslipidemia 10/31/2011   At risk for diabetes mellitus 10/31/2011   Migraine with aura 10/29/2011   History of CVA in adulthood 10/28/2011   Vertigo 10/28/2011   Pain in limb 05/15/2011    Past Surgical History:  Procedure Laterality Date   COLONOSCOPY N/A 03/22/2015   Procedure: COLONOSCOPY;  Surgeon: West Bali, MD;  [facility];  Service: Endoscopy;  Laterality: N/A;  1215   ESOPHAGOGASTRODUODENOSCOPY N/A 12/22/2014   Dr. Darrick Penna: 1. dysphagia due to mid-esophageal web and uncontrolled reflux esophagitis. 2. small hiatal  hernia 3. multiple small gastric and duodenal ulcers and mild duodenitis . Path with reactive gastropathy, negative H. pylori    ESOPHAGOGASTRODUODENOSCOPY N/A 03/22/2015   Procedure: ESOPHAGOGASTRODUODENOSCOPY (EGD);  Surgeon: West Bali, MD;  [facility];  Service: Endoscopy;  Laterality: N/A;   HERNIA REPAIR Left 2007   INCISION AND DRAINAGE Right 12/18/2014   Procedure: INCISION AND DRAINAGE THUMB;  Surgeon: Vickki Hearing, MD;  Location: [facility];  Service: Orthopedics;  Laterality: Right;   INCISION AND DRAINAGE Right 12/21/2014   Procedure: INCISION AND DRAINAGE RIGHT THUMB;  Surgeon: Vickki Hearing, MD;  Location: [facility];  Service: Orthopedics;  Laterality: Right;   incision and drainage of thumb  12/18/14   TOTAL HIP ARTHROPLASTY Right 09/18/2016   TOTAL HIP ARTHROPLASTY Right 09/18/2016   Procedure: TOTAL HIP ARTHROPLASTY ANTERIOR APPROACH;  Surgeon: Samson Frederic, MD;  Location: [facility];  Service: Orthopedics;  Laterality: Right;   TOTAL HIP ARTHROPLASTY Left 02/26/2017   Procedure: LEFT TOTAL HIP ARTHROPLASTY; ANTERIOR APPROACH;  Surgeon: Samson Frederic, MD;  Location: [facility];  Service: Orthopedics;  Laterality: Left;       Home Medications    Prior to Admission medications   Medication Sig Start Date End Date Taking? Authorizing Provider  benzonatate (TESSALON) 100 MG capsule Take 2 capsules (200 mg total) by mouth 2 (two) times daily as needed for cough. 08/01/20  Yes Denilson Salminen,  Godfrey Pick, FNP  doxycycline (VIBRAMYCIN) 100 MG capsule Take 1 capsule (100 mg total) by mouth 2 (two) times daily for 10 days. 08/01/20 08/11/20 Yes Bing Neighbors, FNP  predniSONE (DELTASONE) 20 MG tablet Take 1 tablet (20 mg total) by mouth daily with breakfast for 5 days. 08/01/20 08/06/20 Yes Bing Neighbors, FNP  aspirin 81 MG chewable tablet Chew 1 tablet (81 mg total) by mouth 2 (two) times daily. Patient not taking: Reported on 04/21/2020 02/27/17   Samson Frederic, MD   ondansetron (ZOFRAN ODT) 4 MG disintegrating tablet Take 1 tablet (4 mg total) by mouth every 8 (eight) hours as needed for nausea or vomiting. Patient not taking: Reported on 04/21/2020 09/17/19   Cristina Gong, PA-C  ondansetron (ZOFRAN) 4 MG tablet Take 1 tablet (4 mg total) by mouth every 8 (eight) hours as needed for nausea or vomiting. Patient not taking: Reported on 04/21/2020 09/17/19   Cristina Gong, PA-C  oxyCODONE-acetaminophen (PERCOCET/ROXICET) 5-325 MG tablet Take 1 tablet by mouth every 6 (six) hours as needed for severe pain. Patient not taking: Reported on 04/21/2020 09/17/19   Cristina Gong, PA-C  oxyCODONE-acetaminophen (PERCOCET/ROXICET) 5-325 MG tablet Take 1 tablet by mouth every 6 (six) hours as needed for severe pain. Patient not taking: Reported on 04/21/2020 09/17/19   Cristina Gong, PA-C  pantoprazole (PROTONIX) 40 MG tablet Take 1 tablet (40 mg total) by mouth daily. 04/21/20 04/21/21  Lanelle Bal, DO  tamsulosin (FLOMAX) 0.4 MG CAPS capsule Take 1 capsule (0.4 mg total) by mouth daily. Patient not taking: Reported on 04/21/2020 09/17/19   Cristina Gong, PA-C    Family History Family History  Problem Relation Age of Onset   Cancer Mother    Hyperlipidemia Mother    Hypertension Mother    Heart attack Mother    Rheumatologic disease Mother    Cancer Father    Heart disease Father 81       Heart Disease before age 11   Heart attack Father    Diabetes Sister    Cancer Sister    Hyperlipidemia Sister    Hypertension Sister    Colon cancer Neg Hx     Social History Social History   Tobacco Use   Smoking status: Former    Packs/day: 3.00    Years: 30.00    Pack years: 90.00    Types: Cigarettes    Quit date: 01/30/2005    Years since quitting: 15.5   Smokeless tobacco: Never  Vaping Use   Vaping Use: Never used  Substance Use Topics   Alcohol use: Not Currently   Drug use: No     Allergies   Codeine and  Hydrocodone   Review of Systems Review of Systems Pertinent negatives listed in HPI   Physical Exam Triage Vital Signs ED Triage Vitals  Enc Vitals Group     BP 08/01/20 1125 135/87     Pulse Rate 08/01/20 1125 78     Resp --      Temp 08/01/20 1125 98 F (36.7 C)     Temp Source 08/01/20 1125 Oral     SpO2 08/01/20 1125 97 %     Weight --      Height --      Head Circumference --      Peak Flow --      Pain Score 08/01/20 1123 0     Pain Loc --      Pain Edu? --  Excl. in GC? --    No data found.  Updated Vital Signs BP 135/87 (BP Location: Right Arm)   Pulse 78   Temp 98 F (36.7 C) (Oral)   SpO2 97%   Visual Acuity Right Eye Distance:   Left Eye Distance:   Bilateral Distance:    Right Eye Near:   Left Eye Near:    Bilateral Near:     Physical Exam General appearance: alert, Ill-appearing, no distress Head: Normocephalic, without obvious abnormality, atraumatic ENT: External ears normal, nares with mucosal edema, congestion, oropharynx patent Respiratory: Respirations even , unlabored, coarse lung sound, expiratory wheeze Heart: Rate and rhythm normal. No gallop or murmurs noted on exam  Abdomen: BS +, no distention, no rebound tenderness, or no mass Extremities: No gross deformities Skin: Skin color, texture, turgor normal. No rashes seen  Psych: Appropriate mood and affect. Neurologic: GCS 15, normal coordination, normal gait.   UC Treatments / Results  Labs (all labs ordered are listed, but only abnormal results are displayed) Labs Reviewed - No data to display  EKG   Radiology No results found.  Procedures Procedures (including critical care time)  Medications Ordered in UC Medications - No data to display  Initial Impression / Assessment and Plan / UC Course  I have reviewed the triage vital signs and the nursing notes.  Pertinent labs & imaging results that were available during my care of the patient were reviewed by me and  considered in my medical decision making (see chart for details).    Lower respiratory infection treatment today with doxycycline twice daily for 10 days.  Benzonatate Perles for cough.  Declined COVID testing prednisone 20 mg once daily for 5 days.  Strict return precautions if any symptoms worsen or do not improve. Final Clinical Impressions(s) / UC Diagnoses   Final diagnoses:  Lower respiratory infection (e.g., bronchitis, pneumonia, pneumonitis, pulmonitis)  Cough   Discharge Instructions   None    ED Prescriptions     Medication Sig Dispense Auth. Provider   doxycycline (VIBRAMYCIN) 100 MG capsule Take 1 capsule (100 mg total) by mouth 2 (two) times daily for 10 days. 20 capsule Bing Neighbors, FNP   benzonatate (TESSALON) 100 MG capsule Take 2 capsules (200 mg total) by mouth 2 (two) times daily as needed for cough. 40 capsule Bing Neighbors, FNP   predniSONE (DELTASONE) 20 MG tablet Take 1 tablet (20 mg total) by mouth daily with breakfast for 5 days. 5 tablet Bing Neighbors, FNP      PDMP not reviewed this encounter.   Bing Neighbors, FNP 08/03/20 [phone]

## 2020-08-01 NOTE — ED Triage Notes (Signed)
Patient presents to Urgent Care with complaints of a productive cough and congestion x 3 days. He states his cough is worse at night.   Denies fever.

## 2020-09-26 ENCOUNTER — Ambulatory Visit (INDEPENDENT_AMBULATORY_CARE_PROVIDER_SITE_OTHER): Payer: BC Managed Care – PPO

## 2020-09-26 ENCOUNTER — Ambulatory Visit
Admission: EM | Admit: 2020-09-26 | Discharge: 2020-09-26 | Disposition: A | Discharge status: Home / Self Care | Payer: BC Managed Care – PPO | Attending: Family Medicine | Admitting: Family Medicine

## 2020-09-26 ENCOUNTER — Encounter: Payer: Self-pay | Admitting: Emergency Medicine

## 2020-09-26 DIAGNOSIS — M79604 Pain in right leg: Secondary | ICD-10-CM | POA: Diagnosis not present

## 2020-09-26 DIAGNOSIS — M25551 Pain in right hip: Secondary | ICD-10-CM

## 2020-09-26 MED ORDER — PREDNISONE 20 MG PO TABS: 40.0000 mg | ORAL_TABLET | Freq: Every day | ORAL | 0 refills | Status: DC

## 2020-09-26 MED ORDER — DEXAMETHASONE SODIUM PHOSPHATE 10 MG/ML IJ SOLN
10.0000 mg | Freq: Once | INTRAMUSCULAR | Status: AC
  Administered 2020-09-26: 10 mg via INTRAMUSCULAR

## 2020-09-26 MED ORDER — KETOROLAC TROMETHAMINE 30 MG/ML IJ SOLN
30.0000 mg | Freq: Once | INTRAMUSCULAR | Status: AC
  Administered 2020-09-26: 30 mg via INTRAMUSCULAR

## 2020-09-26 NOTE — ED Triage Notes (Signed)
RT leg pain from groin to the knee that hurts with movement x 3 weeks denies any injury.  Trouble hearing from LT ear x 2 weeks.

## 2020-09-26 NOTE — Discharge Instructions (Addendum)
Follow-up with [facility]  Start prednisone 40 mg daily x 5 days tomorrow. Notify your orthopedic provider that I started you on prednisone.

## 2020-09-26 NOTE — ED Provider Notes (Signed)
[facility]    CSN: [id number] Arrival date & time: 09/26/20  1126      History   Chief Complaint Chief Complaint  Patient presents with   Leg Pain    HPI Cory Roberts is a 60 y.o. male.   HPI Patient with a history of avascular necrosis involving the right femur, arthritis, CVA and hearing loss presents today with right groin pain x2 weeks.  Patient has been followed in the past by [facility] however hasn't  been evaluated in their office recently.  He reports that the pain is starting in his right groin area and radiating down thigh medial laterally and ceasing at his knee.  He denies any known injury.  The pain is sometimes throbbing and occasionally sharp.  He has been taking over-the-counter medication which has not alleviated the pain. Past Medical History:  Diagnosis Date   Arthritis Feb. 2013   Gout- Right foot   CVA (cerebral infarction)    GERD (gastroesophageal reflux disease)    Gout    Headache    Migraine Headaches   Raynaud's syndrome    Stroke [facility]) 2011    Patient Active Problem List   Diagnosis Date Noted   Aortic atherosclerosis (HCC) 09/17/2019   Diverticulosis 09/17/2019   GERD (gastroesophageal reflux disease) 10/14/2018   Displaced fracture of left femoral neck (HCC) 02/26/2017   Closed left hip fracture, initial encounter (HCC) 02/23/2017   Mild renal insufficiency 02/23/2017   Avascular necrosis of hip, right (HCC) 09/18/2016   Encounter for screening colonoscopy 03/08/2015   PUD (peptic ulcer disease)    Dysphagia    Abscess of right thumb 12/18/2014   Abscess of thumb 12/15/2014   Cellulitis and abscess 12/15/2014   Obesity 10/31/2011   Dyslipidemia 10/31/2011   At risk for diabetes mellitus 10/31/2011   Migraine with aura 10/29/2011   History of CVA in adulthood 10/28/2011   Vertigo 10/28/2011   Pain in limb 05/15/2011    Past Surgical History:  Procedure Laterality Date   COLONOSCOPY N/A 03/22/2015   Procedure:  COLONOSCOPY;  Surgeon: West Bali, MD;  [facility]: [facility];  Service: Endoscopy;  Laterality: N/A;  1215   ESOPHAGOGASTRODUODENOSCOPY N/A 12/22/2014   Dr. Darrick Penna: 1. dysphagia due to mid-esophageal web and uncontrolled reflux esophagitis. 2. small hiatal hernia 3. multiple small gastric and duodenal ulcers and mild duodenitis . Path with reactive gastropathy, negative H. pylori    ESOPHAGOGASTRODUODENOSCOPY N/A 03/22/2015   Procedure: ESOPHAGOGASTRODUODENOSCOPY (EGD);  Surgeon: West Bali, MD;  Location: [facility];  Service: Endoscopy;  Laterality: N/A;   HERNIA REPAIR Left 2007   INCISION AND DRAINAGE Right 12/18/2014   Procedure: INCISION AND DRAINAGE THUMB;  Surgeon: Vickki Hearing, MD;  Location: [facility];  Service: Orthopedics;  Laterality: Right;   INCISION AND DRAINAGE Right 12/21/2014   Procedure: INCISION AND DRAINAGE RIGHT THUMB;  Surgeon: Vickki Hearing, MD;  Location: [facility];  Service: Orthopedics;  Laterality: Right;   incision and drainage of thumb  12/18/14   TOTAL HIP ARTHROPLASTY Right 09/18/2016   TOTAL HIP ARTHROPLASTY Right 09/18/2016   Procedure: TOTAL HIP ARTHROPLASTY ANTERIOR APPROACH;  Surgeon: Samson Frederic, MD;  Location: [facility];  Service: Orthopedics;  Laterality: Right;   TOTAL HIP ARTHROPLASTY Left 02/26/2017   Procedure: LEFT TOTAL HIP ARTHROPLASTY; ANTERIOR APPROACH;  Surgeon: Samson Frederic, MD;  Location: [facility] OR;  Service: Orthopedics;  Laterality: Left;       Home Medications  Prior to Admission medications   Medication Sig Start Date End Date Taking? Authorizing Provider  predniSONE (DELTASONE) 20 MG tablet Take 2 tablets (40 mg total) by mouth daily with breakfast. 09/26/20  Yes Bing NeighborsHarris, Gabi Mcfate S, FNP  aspirin 81 MG chewable tablet Chew 1 tablet (81 mg total) by mouth 2 (two) times daily. Patient not taking: Reported on 04/21/2020 02/27/17   Samson FredericSwinteck, Brian, MD  benzonatate (TESSALON) 100 MG capsule Take 2 capsules (200 mg total) by  mouth 2 (two) times daily as needed for cough. 08/01/20   Bing NeighborsHarris, Tasmin Exantus S, FNP  ondansetron (ZOFRAN ODT) 4 MG disintegrating tablet Take 1 tablet (4 mg total) by mouth every 8 (eight) hours as needed for nausea or vomiting. Patient not taking: Reported on 04/21/2020 09/17/19   Cristina GongHammond, Elizabeth W, PA-C  ondansetron (ZOFRAN) 4 MG tablet Take 1 tablet (4 mg total) by mouth every 8 (eight) hours as needed for nausea or vomiting. Patient not taking: Reported on 04/21/2020 09/17/19   Cristina GongHammond, Elizabeth W, PA-C  oxyCODONE-acetaminophen (PERCOCET/ROXICET) 5-325 MG tablet Take 1 tablet by mouth every 6 (six) hours as needed for severe pain. Patient not taking: Reported on 04/21/2020 09/17/19   Cristina GongHammond, Elizabeth W, PA-C  oxyCODONE-acetaminophen (PERCOCET/ROXICET) 5-325 MG tablet Take 1 tablet by mouth every 6 (six) hours as needed for severe pain. Patient not taking: Reported on 04/21/2020 09/17/19   Cristina GongHammond, Elizabeth W, PA-C  pantoprazole (PROTONIX) 40 MG tablet Take 1 tablet (40 mg total) by mouth daily. 04/21/20 04/21/21  Lanelle Balarver, Charles K, DO  tamsulosin (FLOMAX) 0.4 MG CAPS capsule Take 1 capsule (0.4 mg total) by mouth daily. Patient not taking: Reported on 04/21/2020 09/17/19   Cristina GongHammond, Elizabeth W, PA-C    Family History Family History  Problem Relation Age of Onset   Cancer Mother    Hyperlipidemia Mother    Hypertension Mother    Heart attack Mother    Rheumatologic disease Mother    Cancer Father    Heart disease Father 2448       Heart Disease before age 62   Heart attack Father    Diabetes Sister    Cancer Sister    Hyperlipidemia Sister    Hypertension Sister    Colon cancer Neg Hx     Social History Social History   Tobacco Use   Smoking status: Former    Packs/day: 3.00    Years: 30.00    Pack years: 90.00    Types: Cigarettes    Quit date: 01/30/2005    Years since quitting: 15.6   Smokeless tobacco: Never  Vaping Use   Vaping Use: Never used  Substance Use Topics    Alcohol use: Not Currently   Drug use: No     Allergies   Codeine and Hydrocodone   Review of Systems Review of Systems Pertinent negatives listed in HPI  Physical Exam Triage Vital Signs ED Triage Vitals  Enc Vitals Group     BP 09/26/20 1249 134/81     Pulse Rate 09/26/20 1249 74     Resp 09/26/20 1249 18     Temp 09/26/20 1249 97.9 F (36.6 C)     Temp Source 09/26/20 1249 Temporal     SpO2 09/26/20 1249 98 %     Weight --      Height --      Head Circumference --      Peak Flow --      Pain Score 09/26/20 1250 8  Pain Loc --      Pain Edu? --      Excl. in GC? --    No data found.  Updated Vital Signs BP 134/81 (BP Location: Right Arm)   Pulse 74   Temp 97.9 F (36.6 C) (Temporal)   Resp 18   SpO2 98%   Visual Acuity Right Eye Distance:   Left Eye Distance:   Bilateral Distance:    Right Eye Near:   Left Eye Near:    Bilateral Near:     Physical Exam Constitutional:      General: He is not in acute distress.    Appearance: He is not ill-appearing.  HENT:     Head: Normocephalic and atraumatic.  Cardiovascular:     Rate and Rhythm: Normal rate and regular rhythm.  Pulmonary:     Effort: Pulmonary effort is normal.     Breath sounds: Normal breath sounds.  Musculoskeletal:        General: No deformity or signs of injury.     Right lower leg: No edema.     Left lower leg: No edema.  Skin:    General: Skin is warm and dry.     Capillary Refill: Capillary refill takes less than 2 seconds.  Neurological:     Mental Status: He is alert.  Psychiatric:        Mood and Affect: Mood normal.        Behavior: Behavior normal.        Thought Content: Thought content normal.     UC Treatments / Results  Labs (all labs ordered are listed, but only abnormal results are displayed) Labs Reviewed - No data to display  EKG   Radiology DG Hip Unilat W or Wo Pelvis 2-3 Views Right  Result Date: 09/26/2020 CLINICAL DATA:  Right hip pain for the  past week. EXAM: DG HIP (WITH OR WITHOUT PELVIS) 2-3V RIGHT COMPARISON:  CT abdomen pelvis dated September 17, 2019. FINDINGS: No acute fracture or dislocation. Prior bilateral total hip arthroplasties. Of the right arthroplasty no evidence of hardware failure or loosening. There is a new lucency along the medial aspect of the left distal femoral stem. The pubic symphysis and sacroiliac joints are unremarkable. Advanced lower lumbar spondylosis. IMPRESSION: 1. No acute osseous abnormality. No hardware complication of the right hip arthroplasty 2. New lucency along the medial aspect of the left distal femoral stem could reflect loosening. Electronically Signed   By: Obie Dredge M.D.   On: 09/26/2020 13:35    Procedures Procedures (including critical care time)  Medications Ordered in UC Medications  ketorolac (TORADOL) 30 MG/ML injection 30 mg (has no administration in time range)  dexamethasone (DECADRON) injection 10 mg (has no administration in time range)    Initial Impression / Assessment and Plan / UC Course  I have reviewed the triage vital signs and the nursing notes.  Pertinent labs & imaging results that were available during my care of the patient were reviewed by me and considered in my medical decision making (see chart for details).     Imaging of the right hip appears to be intact and provides no explanation for patient's pain.  We will trial a course of prednisone along with advised patient to follow-up with [facility] just for evaluation given the abnormal finding of the left hip according to radiologist report there is questionable hardware left hip.  Final Clinical Impressions(s) / UC Diagnoses   Final diagnoses:  Leg pain, anterior,  right     Discharge Instructions      Follow-up with [facility]  Start prednisone 40 mg daily x 5 days tomorrow. Notify your orthopedic provider that I started you on prednisone.      ED Prescriptions     Medication Sig  Dispense Auth. Provider   predniSONE (DELTASONE) 20 MG tablet Take 2 tablets (40 mg total) by mouth daily with breakfast. 10 tablet Bing Neighbors, FNP      PDMP not reviewed this encounter.   Bing Neighbors, FNP 09/28/20 1257

## 2020-10-11 DIAGNOSIS — Z96641 Presence of right artificial hip joint: Secondary | ICD-10-CM | POA: Diagnosis not present

## 2020-10-11 DIAGNOSIS — T8484XD Pain due to internal orthopedic prosthetic devices, implants and grafts, subsequent encounter: Secondary | ICD-10-CM | POA: Diagnosis not present

## 2020-10-12 ENCOUNTER — Other Ambulatory Visit: Payer: Self-pay | Admitting: Orthopedic Surgery

## 2020-10-12 ENCOUNTER — Other Ambulatory Visit (HOSPITAL_COMMUNITY): Payer: Self-pay | Admitting: Orthopedic Surgery

## 2020-10-12 DIAGNOSIS — T8484XD Pain due to internal orthopedic prosthetic devices, implants and grafts, subsequent encounter: Secondary | ICD-10-CM

## 2020-10-13 DIAGNOSIS — T8484XD Pain due to internal orthopedic prosthetic devices, implants and grafts, subsequent encounter: Secondary | ICD-10-CM | POA: Diagnosis not present

## 2020-10-15 ENCOUNTER — Encounter (HOSPITAL_COMMUNITY)
Admission: RE | Admit: 2020-10-15 | Discharge: 2020-10-15 | Disposition: A | Discharge status: Home / Self Care | Payer: BC Managed Care – PPO | Source: Ambulatory Visit | Attending: Orthopedic Surgery | Admitting: Orthopedic Surgery

## 2020-10-15 ENCOUNTER — Other Ambulatory Visit: Payer: Self-pay

## 2020-10-15 DIAGNOSIS — T8484XD Pain due to internal orthopedic prosthetic devices, implants and grafts, subsequent encounter: Secondary | ICD-10-CM | POA: Diagnosis not present

## 2020-10-15 DIAGNOSIS — M25551 Pain in right hip: Secondary | ICD-10-CM | POA: Diagnosis not present

## 2020-10-15 MED ORDER — TECHNETIUM TC 99M MEDRONATE IV KIT
20.0000 | PACK | Freq: Once | INTRAVENOUS | Status: AC | PRN
  Administered 2020-10-15: 20 via INTRAVENOUS

## 2020-10-22 DIAGNOSIS — T8484XD Pain due to internal orthopedic prosthetic devices, implants and grafts, subsequent encounter: Secondary | ICD-10-CM | POA: Diagnosis not present

## 2020-10-25 ENCOUNTER — Ambulatory Visit: Payer: Self-pay | Admitting: Student

## 2020-10-27 ENCOUNTER — Other Ambulatory Visit: Payer: Self-pay

## 2020-10-27 ENCOUNTER — Encounter (HOSPITAL_COMMUNITY)
Admission: RE | Admit: 2020-10-27 | Discharge: 2020-10-27 | Disposition: A | Discharge status: Home / Self Care | Payer: BC Managed Care – PPO | Source: Ambulatory Visit | Attending: Orthopedic Surgery | Admitting: Orthopedic Surgery

## 2020-10-27 ENCOUNTER — Encounter (HOSPITAL_COMMUNITY): Payer: Self-pay

## 2020-10-27 DIAGNOSIS — Z01812 Encounter for preprocedural laboratory examination: Secondary | ICD-10-CM | POA: Diagnosis not present

## 2020-10-27 HISTORY — DX: Personal history of urinary calculi: Z87.442

## 2020-10-27 LAB — CBC
HCT: 46.4 % (ref 39.0–52.0)
Hemoglobin: 15.6 g/dL (ref 13.0–17.0)
MCH: 33.4 pg (ref 26.0–34.0)
MCHC: 33.6 g/dL (ref 30.0–36.0)
MCV: 99.4 fL (ref 80.0–100.0)
Platelets: 209 10*3/uL (ref 150–400)
RBC: 4.67 MIL/uL (ref 4.22–5.81)
RDW: 12.8 % (ref 11.5–15.5)
WBC: 4.2 10*3/uL (ref 4.0–10.5)
nRBC: 0 % (ref 0.0–0.2)

## 2020-10-27 LAB — PROTIME-INR
INR: 1 (ref 0.8–1.2)
Prothrombin Time: 13.3 seconds (ref 11.4–15.2)

## 2020-10-27 LAB — COMPREHENSIVE METABOLIC PANEL
ALT: 25 U/L (ref 0–44)
AST: 21 U/L (ref 15–41)
Albumin: 4.3 g/dL (ref 3.5–5.0)
Alkaline Phosphatase: 113 U/L (ref 38–126)
Anion gap: 7 (ref 5–15)
BUN: 27 mg/dL — ABNORMAL HIGH (ref 8–23)
CO2: 27 mmol/L (ref 22–32)
Calcium: 9.3 mg/dL (ref 8.9–10.3)
Chloride: 102 mmol/L (ref 98–111)
Creatinine, Ser: 0.99 mg/dL (ref 0.61–1.24)
GFR, Estimated: >60 mL/min (ref >60)
Glucose, Bld: 117 mg/dL — ABNORMAL HIGH (ref 70–99)
Potassium: 3.5 mmol/L (ref 3.5–5.1)
Sodium: 136 mmol/L (ref 135–145)
Total Bilirubin: 1.1 mg/dL (ref 0.3–1.2)
Total Protein: 7 g/dL (ref 6.5–8.1)

## 2020-10-27 NOTE — Progress Notes (Addendum)
PCP - Terie Purser, PA Cardiologist - no  PPM/ICD -  Device Orders -  Rep Notified -   Chest x-ray -  EKG -  Stress Test -  ECHO -  Cardiac Cath -   Sleep Study -  CPAP -   Fasting Blood Sugar -  Checks Blood Sugar _____ times a day  Blood Thinner Instructions: Aspirin Instructions:  ERAS Protcol - PRE-SURGERY Ensure or G2-   COVID TEST- 10-27-20 COVID vaccine -  Activity--Able to walk a flight of stairs with 50 pounds on his back for work without SOB Anesthesia review: CVA 2011 no defecits  Patient denies shortness of breath, fever, cough and chest pain at PAT appointment   All instructions explained to the patient, with a verbal understanding of the material. Patient agrees to go over the instructions while at home for a better understanding. Patient also instructed to self quarantine after being tested for COVID-19. The opportunity to ask questions was provided.

## 2020-10-27 NOTE — Patient Instructions (Addendum)
DUE TO COVID-19 ONLY ONE VISITOR IS ALLOWED TO COME WITH YOU AND STAY IN THE WAITING ROOM ONLY DURING PRE OP AND PROCEDURE DAY OF SURGERY.   TWO VISITOR  MAY VISIT WITH YOU AFTER SURGERY IN YOUR PRIVATE ROOM DURING VISITING HOURS ONLY!  YOU NEED TO HAVE A COVID 19 TEST ON__  the day of your surgery when you get to the hospital          Your procedure is scheduled on: 10-28-20   Report to [facility] Main  Entrance   Report to admitting at      1030 AM     Call this number if you have problems the morning of surgery [phone]   Remember: NO SOLID FOOD AFTER MIDNIGHT THE NIGHT PRIOR TO SURGERY. NOTHING BY MOUTH EXCEPT CLEAR LIQUIDS UNTIL     1030 am .   PLEASE FINISH ENSURE DRINK PER SURGEON ORDER  WHICH NEEDS TO BE COMPLETED AT       1030 am then nothing by mouth.     CLEAR LIQUID DIET                                                                    water Black Coffee and tea, regular and decaf                             Plain Jell-O any favor except red or purple                                  Fruit ices (not with fruit pulp)                                      Iced Popsicles                                                                      Cranberry, grape and apple juices Sports drinks like Gatorade Lightly seasoned clear broth or consume(fat free) Sugar, honey syrup _____________________________________________________________________     BRUSH YOUR TEETH MORNING OF SURGERY AND RINSE YOUR MOUTH OUT, NO CHEWING GUM CANDY OR MINTS.     Take these medicines the morning of surgery with A SIP OF WATER: pantoprazole                                 You may not have any metal on your body including hair pins and              piercings  Do not wear jewelry, lotions, powders,perfumes,      deodorant                       Men may shave face and neck.  Do not bring valuables to the hospital. [facility] IS NOT             RESPONSIBLE   FOR  VALUABLES.  Contacts, dentures or bridgework may not be worn into surgery.       Patients discharged the day of surgery will not be allowed to drive home. IF YOU ARE HAVING SURGERY AND GOING HOME THE SAME DAY, YOU MUST HAVE AN ADULT TO DRIVE YOU HOME AND BE WITH YOU FOR 24 HOURS. YOU MAY GO HOME BY TAXI OR UBER OR ORTHERWISE, BUT AN ADULT MUST ACCOMPANY YOU HOME AND STAY WITH YOU FOR 24 HOURS.  Name and phone number of your driver:  Special Instructions: N/A              Please read over the following fact sheets you were given: _____________________________________________________________________             [facility] - Preparing for Surgery Before surgery, you can play an important role.  Because skin is not sterile, your skin needs to be as free of germs as possible.  You can reduce the number of germs on your skin by washing with CHG (chlorahexidine gluconate) soap before surgery.  CHG is an antiseptic cleaner which kills germs and bonds with the skin to continue killing germs even after washing. Please DO NOT use if you have an allergy to CHG or antibacterial soaps.  If your skin becomes reddened/irritated stop using the CHG and inform your nurse when you arrive at Short Stay. Do not shave (including legs and underarms) for at least 48 hours prior to the first CHG shower.  You may shave your face/neck. Please follow these instructions carefully:  1.  Shower with CHG Soap the night before surgery and the  morning of Surgery.  2.  If you choose to wash your hair, wash your hair first as usual with your  normal  shampoo.  3.  After you shampoo, rinse your hair and body thoroughly to remove the  shampoo.                           4.  Use CHG as you would any other liquid soap.  You can apply chg directly  to the skin and wash                       Gently with a scrungie or clean washcloth.  5.  Apply the CHG Soap to your body ONLY FROM THE NECK DOWN.   Do not use on face/ open                            Wound or open sores. Avoid contact with eyes, ears mouth and genitals (private parts).                       Wash face,  Genitals (private parts) with your normal soap.             6.  Wash thoroughly, paying special attention to the area where your surgery  will be performed.  7.  Thoroughly rinse your body with warm water from the neck down.  8.  DO NOT shower/wash with your normal soap after using and rinsing off  the CHG Soap.  9.  Pat yourself dry with a clean towel.            10.  Wear clean pajamas.            11.  Place clean sheets on your bed the night of your first shower and do not  sleep with pets. Day of Surgery : Do not apply any lotions/deodorants the morning of surgery.  Please wear clean clothes to the hospital/surgery center.  FAILURE TO FOLLOW THESE INSTRUCTIONS MAY RESULT IN THE CANCELLATION OF YOUR SURGERY PATIENT SIGNATURE_________________________________  NURSE SIGNATURE__________________________________  ________________________________________________________________________    Cory Roberts  An incentive spirometer is a tool that can help keep your lungs clear and active. This tool measures how well you are filling your lungs with each breath. Taking long deep breaths may help reverse or decrease the chance of developing breathing (pulmonary) problems (especially infection) following: A long period of time when you are unable to move or be active. BEFORE THE PROCEDURE  If the spirometer includes an indicator to show your best effort, your nurse or respiratory therapist will set it to a desired goal. If possible, sit up straight or lean slightly forward. Try not to slouch. Hold the incentive spirometer in an upright position. INSTRUCTIONS FOR USE  Sit on the edge of your bed if possible, or sit up as far as you can in bed or on a chair. Hold the incentive spirometer in an upright position. Breathe out normally. Place the  mouthpiece in your mouth and seal your lips tightly around it. Breathe in slowly and as deeply as possible, raising the piston or the ball toward the top of the column. Hold your breath for 3-5 seconds or for as long as possible. Allow the piston or ball to fall to the bottom of the column. Remove the mouthpiece from your mouth and breathe out normally. Rest for a few seconds and repeat Steps 1 through 7 at least 10 times every 1-2 hours when you are awake. Take your time and take a few normal breaths between deep breaths. The spirometer may include an indicator to show your best effort. Use the indicator as a goal to work toward during each repetition. After each set of 10 deep breaths, practice coughing to be sure your lungs are clear. If you have an incision (the cut made at the time of surgery), support your incision when coughing by placing a pillow or rolled up towels firmly against it. Once you are able to get out of bed, walk around indoors and cough well. You may stop using the incentive spirometer when instructed by your caregiver.  RISKS AND COMPLICATIONS Take your time so you do not get dizzy or light-headed. If you are in pain, you may need to take or ask for pain medication before doing incentive spirometry. It is harder to take a deep breath if you are having pain. AFTER USE Rest and breathe slowly and easily. It can be helpful to keep track of a log of your progress. Your caregiver can provide you with a simple table to help with this. If you are using the spirometer at home, follow these instructions: [place] IF:  You are having difficultly using the spirometer. You have trouble using the spirometer as often as instructed. Your pain medication is not giving enough relief while using the spirometer. You develop fever of 100.5 F (38.1 C) or higher. SEEK IMMEDIATE MEDICAL CARE IF:  You cough up bloody sputum that had  not been present before. You develop fever of 102 F  (38.9 C) or greater. You develop worsening pain at or near the incision site. MAKE SURE YOU:  Understand these instructions. Will watch your condition. Will get help right away if you are not doing well or get worse. Document Released: 05/29/2006 Document Revised: 04/10/2011 Document Reviewed: 07/30/2006 [facility] Patient Information 2014 [place], [place].   ________________________________________________________________________

## 2020-10-28 ENCOUNTER — Ambulatory Visit (HOSPITAL_COMMUNITY): Payer: BC Managed Care – PPO

## 2020-10-28 ENCOUNTER — Encounter (HOSPITAL_COMMUNITY): Payer: Self-pay | Admitting: Orthopedic Surgery

## 2020-10-28 ENCOUNTER — Observation Stay (HOSPITAL_COMMUNITY): Payer: BC Managed Care – PPO

## 2020-10-28 ENCOUNTER — Encounter (HOSPITAL_COMMUNITY)
Admission: RE | Disposition: A | Discharge status: Home / Self Care | Payer: Self-pay | Source: Ambulatory Visit | Attending: Orthopedic Surgery

## 2020-10-28 ENCOUNTER — Observation Stay (HOSPITAL_COMMUNITY)
Admission: RE | Admit: 2020-10-28 | Discharge: 2020-10-29 | Disposition: A | Discharge status: Home / Self Care | Payer: BC Managed Care – PPO | Source: Ambulatory Visit | Attending: Orthopedic Surgery | Admitting: Orthopedic Surgery

## 2020-10-28 ENCOUNTER — Ambulatory Visit (HOSPITAL_COMMUNITY): Payer: BC Managed Care – PPO | Admitting: Certified Registered Nurse Anesthetist

## 2020-10-28 ENCOUNTER — Other Ambulatory Visit: Payer: Self-pay

## 2020-10-28 DIAGNOSIS — Z7982 Long term (current) use of aspirin: Secondary | ICD-10-CM | POA: Insufficient documentation

## 2020-10-28 DIAGNOSIS — Z87891 Personal history of nicotine dependence: Secondary | ICD-10-CM | POA: Insufficient documentation

## 2020-10-28 DIAGNOSIS — M84351A Stress fracture, right femur, initial encounter for fracture: Secondary | ICD-10-CM | POA: Diagnosis not present

## 2020-10-28 DIAGNOSIS — Z471 Aftercare following joint replacement surgery: Secondary | ICD-10-CM | POA: Diagnosis not present

## 2020-10-28 DIAGNOSIS — I700 Atherosclerosis of aorta: Secondary | ICD-10-CM | POA: Diagnosis not present

## 2020-10-28 DIAGNOSIS — Z8673 Personal history of transient ischemic attack (TIA), and cerebral infarction without residual deficits: Secondary | ICD-10-CM | POA: Diagnosis not present

## 2020-10-28 DIAGNOSIS — Z96643 Presence of artificial hip joint, bilateral: Secondary | ICD-10-CM | POA: Diagnosis not present

## 2020-10-28 DIAGNOSIS — Z9889 Other specified postprocedural states: Secondary | ICD-10-CM | POA: Diagnosis not present

## 2020-10-28 DIAGNOSIS — E785 Hyperlipidemia, unspecified: Secondary | ICD-10-CM | POA: Diagnosis not present

## 2020-10-28 DIAGNOSIS — K219 Gastro-esophageal reflux disease without esophagitis: Secondary | ICD-10-CM | POA: Diagnosis not present

## 2020-10-28 DIAGNOSIS — Z20822 Contact with and (suspected) exposure to covid-19: Secondary | ICD-10-CM | POA: Diagnosis not present

## 2020-10-28 DIAGNOSIS — T148XXA Other injury of unspecified body region, initial encounter: Secondary | ICD-10-CM

## 2020-10-28 DIAGNOSIS — Z96641 Presence of right artificial hip joint: Secondary | ICD-10-CM | POA: Diagnosis not present

## 2020-10-28 HISTORY — PX: ORIF FEMUR FRACTURE: SHX2119

## 2020-10-28 LAB — TYPE AND SCREEN
ABO/RH(D): A POS
Antibody Screen: NEGATIVE

## 2020-10-28 LAB — SARS CORONAVIRUS 2 BY RT PCR (HOSPITAL ORDER, PERFORMED IN ~~LOC~~ HOSPITAL LAB): SARS Coronavirus 2: NEGATIVE

## 2020-10-28 SURGERY — OPEN REDUCTION INTERNAL FIXATION (ORIF) DISTAL FEMUR FRACTURE
Anesthesia: Spinal | Laterality: Right

## 2020-10-28 MED ORDER — FENTANYL CITRATE PF 50 MCG/ML IJ SOSY: 50.0000 ug | PREFILLED_SYRINGE | INTRAMUSCULAR | Status: DC

## 2020-10-28 MED ORDER — LACTATED RINGERS IV SOLN: INTRAVENOUS | Status: DC

## 2020-10-28 MED ORDER — PANTOPRAZOLE SODIUM 40 MG PO TBEC
40.0000 mg | DELAYED_RELEASE_TABLET | Freq: Every day | ORAL | Status: DC
  Administered 2020-10-29: 40 mg via ORAL
  Filled 2020-10-28: qty 1

## 2020-10-28 MED ORDER — METOCLOPRAMIDE HCL 5 MG PO TABS: 5.0000 mg | ORAL_TABLET | Freq: Three times a day (TID) | ORAL | Status: DC | PRN

## 2020-10-28 MED ORDER — DOCUSATE SODIUM 100 MG PO CAPS
100.0000 mg | ORAL_CAPSULE | Freq: Two times a day (BID) | ORAL | Status: DC
  Administered 2020-10-28 – 2020-10-29 (×2): 100 mg via ORAL
  Filled 2020-10-28 (×2): qty 1

## 2020-10-28 MED ORDER — ACETAMINOPHEN 10 MG/ML IV SOLN
1000.0000 mg | Freq: Once | INTRAVENOUS | Status: AC
  Administered 2020-10-28: 1000 mg via INTRAVENOUS
  Filled 2020-10-28: qty 100

## 2020-10-28 MED ORDER — MIDAZOLAM HCL 2 MG/2ML IJ SOLN
INTRAMUSCULAR | Status: AC
  Filled 2020-10-28: qty 2

## 2020-10-28 MED ORDER — POVIDONE-IODINE 10 % EX SWAB: 2.0000 "application " | Freq: Once | CUTANEOUS | Status: DC

## 2020-10-28 MED ORDER — OXYCODONE HCL 5 MG PO TABS
5.0000 mg | ORAL_TABLET | ORAL | Status: DC | PRN
  Administered 2020-10-29: 10 mg via ORAL
  Filled 2020-10-28: qty 2

## 2020-10-28 MED ORDER — MIDAZOLAM HCL 2 MG/2ML IJ SOLN
1.0000 mg | INTRAMUSCULAR | Status: DC
  Administered 2020-10-28: 2 mg via INTRAVENOUS

## 2020-10-28 MED ORDER — ONDANSETRON HCL 4 MG PO TABS: 4.0000 mg | ORAL_TABLET | Freq: Four times a day (QID) | ORAL | Status: DC | PRN

## 2020-10-28 MED ORDER — ORAL CARE MOUTH RINSE: 15.0000 mL | Freq: Once | OROMUCOSAL | Status: AC

## 2020-10-28 MED ORDER — CEFAZOLIN SODIUM-DEXTROSE 2-4 GM/100ML-% IV SOLN
2.0000 g | Freq: Four times a day (QID) | INTRAVENOUS | Status: AC
Start: 2020-10-28 — End: 2020-10-29
  Administered 2020-10-28 (×2): 2 g via INTRAVENOUS
  Filled 2020-10-28 (×2): qty 100

## 2020-10-28 MED ORDER — FENTANYL CITRATE (PF) 100 MCG/2ML IJ SOLN
INTRAMUSCULAR | Status: DC | PRN
  Administered 2020-10-28 (×2): 50 ug via INTRAVENOUS

## 2020-10-28 MED ORDER — CEFAZOLIN SODIUM-DEXTROSE 2-4 GM/100ML-% IV SOLN
2.0000 g | INTRAVENOUS | Status: AC
  Administered 2020-10-28: 2 g via INTRAVENOUS
  Filled 2020-10-28: qty 100

## 2020-10-28 MED ORDER — OXYCODONE HCL 5 MG PO TABS
10.0000 mg | ORAL_TABLET | ORAL | Status: DC | PRN
  Administered 2020-10-28: 10 mg via ORAL
  Administered 2020-10-29: 15 mg via ORAL
  Filled 2020-10-28 (×2): qty 3

## 2020-10-28 MED ORDER — DEXAMETHASONE SODIUM PHOSPHATE 10 MG/ML IJ SOLN
INTRAMUSCULAR | Status: DC | PRN
  Administered 2020-10-28: 10 mg via INTRAVENOUS

## 2020-10-28 MED ORDER — ACETAMINOPHEN 325 MG PO TABS: 325.0000 mg | ORAL_TABLET | Freq: Four times a day (QID) | ORAL | Status: DC | PRN

## 2020-10-28 MED ORDER — POVIDONE-IODINE 10 % EX SWAB
2.0000 "application " | Freq: Once | CUTANEOUS | Status: AC
  Administered 2020-10-28: 2 via TOPICAL

## 2020-10-28 MED ORDER — SODIUM CHLORIDE 0.9 % IV SOLN: INTRAVENOUS | Status: DC

## 2020-10-28 MED ORDER — ONDANSETRON HCL 4 MG/2ML IJ SOLN: 4.0000 mg | Freq: Once | INTRAMUSCULAR | Status: DC | PRN

## 2020-10-28 MED ORDER — FENTANYL CITRATE (PF) 100 MCG/2ML IJ SOLN
INTRAMUSCULAR | Status: AC
  Filled 2020-10-28: qty 2

## 2020-10-28 MED ORDER — ONDANSETRON HCL 4 MG/2ML IJ SOLN
4.0000 mg | Freq: Four times a day (QID) | INTRAMUSCULAR | Status: DC | PRN
  Administered 2020-10-29: 4 mg via INTRAVENOUS
  Filled 2020-10-28: qty 2

## 2020-10-28 MED ORDER — PHENOL 1.4 % MT LIQD: 1.0000 | OROMUCOSAL | Status: DC | PRN

## 2020-10-28 MED ORDER — HYDROMORPHONE HCL 1 MG/ML IJ SOLN: 0.2500 mg | INTRAMUSCULAR | Status: DC | PRN

## 2020-10-28 MED ORDER — SALINE SPRAY 0.65 % NA SOLN: 1.0000 | NASAL | Status: DC | PRN

## 2020-10-28 MED ORDER — TRANEXAMIC ACID-NACL 1000-0.7 MG/100ML-% IV SOLN
1000.0000 mg | Freq: Once | INTRAVENOUS | Status: AC
  Administered 2020-10-28: 1000 mg via INTRAVENOUS
  Filled 2020-10-28: qty 100

## 2020-10-28 MED ORDER — HYDROMORPHONE HCL 1 MG/ML IJ SOLN
0.5000 mg | INTRAMUSCULAR | Status: DC | PRN
  Administered 2020-10-28 – 2020-10-29 (×2): 1 mg via INTRAVENOUS
  Filled 2020-10-28 (×2): qty 1

## 2020-10-28 MED ORDER — BUPIVACAINE IN DEXTROSE 0.75-8.25 % IT SOLN
INTRATHECAL | Status: DC | PRN
  Administered 2020-10-28: 1.6 mL via INTRATHECAL

## 2020-10-28 MED ORDER — 0.9 % SODIUM CHLORIDE (POUR BTL) OPTIME
TOPICAL | Status: DC | PRN
  Administered 2020-10-28: 1000 mL

## 2020-10-28 MED ORDER — PROPOFOL 500 MG/50ML IV EMUL
INTRAVENOUS | Status: DC | PRN
  Administered 2020-10-28: 100 ug/kg/min via INTRAVENOUS

## 2020-10-28 MED ORDER — METOCLOPRAMIDE HCL 5 MG/ML IJ SOLN: 5.0000 mg | Freq: Three times a day (TID) | INTRAMUSCULAR | Status: DC | PRN

## 2020-10-28 MED ORDER — MENTHOL 3 MG MT LOZG: 1.0000 | LOZENGE | OROMUCOSAL | Status: DC | PRN

## 2020-10-28 MED ORDER — CHLORHEXIDINE GLUCONATE 0.12 % MT SOLN
15.0000 mL | Freq: Once | OROMUCOSAL | Status: AC
  Administered 2020-10-28: 15 mL via OROMUCOSAL

## 2020-10-28 MED ORDER — METHOCARBAMOL 500 MG IVPB - SIMPLE MED
500.0000 mg | Freq: Four times a day (QID) | INTRAVENOUS | Status: DC | PRN
  Filled 2020-10-28: qty 50

## 2020-10-28 MED ORDER — ALLOPURINOL 100 MG PO TABS
100.0000 mg | ORAL_TABLET | Freq: Every day | ORAL | Status: DC
  Administered 2020-10-29: 100 mg via ORAL
  Filled 2020-10-28: qty 1

## 2020-10-28 MED ORDER — LIDOCAINE 2% (20 MG/ML) 5 ML SYRINGE
INTRAMUSCULAR | Status: DC | PRN
  Administered 2020-10-28: 50 mg via INTRAVENOUS

## 2020-10-28 MED ORDER — TRANEXAMIC ACID-NACL 1000-0.7 MG/100ML-% IV SOLN
1000.0000 mg | INTRAVENOUS | Status: AC
  Administered 2020-10-28: 1000 mg via INTRAVENOUS
  Filled 2020-10-28: qty 100

## 2020-10-28 MED ORDER — ONDANSETRON HCL 4 MG/2ML IJ SOLN
INTRAMUSCULAR | Status: DC | PRN
  Administered 2020-10-28: 4 mg via INTRAVENOUS

## 2020-10-28 MED ORDER — METHOCARBAMOL 500 MG PO TABS
500.0000 mg | ORAL_TABLET | Freq: Four times a day (QID) | ORAL | Status: DC | PRN
  Administered 2020-10-28 – 2020-10-29 (×2): 500 mg via ORAL
  Filled 2020-10-28 (×2): qty 1

## 2020-10-28 MED ORDER — ASPIRIN EC 325 MG PO TBEC
325.0000 mg | DELAYED_RELEASE_TABLET | Freq: Every day | ORAL | Status: DC
  Administered 2020-10-29: 325 mg via ORAL
  Filled 2020-10-28: qty 1

## 2020-10-28 SURGICAL SUPPLY — 67 items
BAG COUNTER SPONGE SURGICOUNT (BAG) IMPLANT
BAG ZIPLOCK 12X15 (MISCELLANEOUS) ×2 IMPLANT
BIT DRILL 4.3 (BIT) ×3 IMPLANT
BIT DRILL 4.3X300MM (BIT) ×1 IMPLANT
BIT DRILL LONG 3.3 (BIT) ×2 IMPLANT
BIT DRILL QC 3.3X195 (BIT) ×2 IMPLANT
BNDG ELASTIC 6X10 VLCR STRL LF (GAUZE/BANDAGES/DRESSINGS) ×2 IMPLANT
BNDG GAUZE ELAST 4 BULKY (GAUZE/BANDAGES/DRESSINGS) ×2 IMPLANT
CAP LOCK NCB (Cap) ×20 IMPLANT
CHLORAPREP W/TINT 26 (MISCELLANEOUS) ×2 IMPLANT
COVER SURGICAL LIGHT HANDLE (MISCELLANEOUS) ×2 IMPLANT
DERMABOND ADVANCED (GAUZE/BANDAGES/DRESSINGS) ×1
DERMABOND ADVANCED .7 DNX12 (GAUZE/BANDAGES/DRESSINGS) ×1 IMPLANT
DRAPE C-ARM 42X120 X-RAY (DRAPES) ×2 IMPLANT
DRAPE C-ARMOR (DRAPES) ×2 IMPLANT
DRAPE ORTHO SPLIT 77X108 STRL (DRAPES) ×4
DRAPE POUCH INSTRU U-SHP 10X18 (DRAPES) ×2 IMPLANT
DRAPE SHEET LG 3/4 BI-LAMINATE (DRAPES) ×6 IMPLANT
DRAPE SURG ORHT 6 SPLT 77X108 (DRAPES) ×2 IMPLANT
DRILL BIT 4.3 (BIT) ×2
DRSG AQUACEL AG ADV 3.5X10 (GAUZE/BANDAGES/DRESSINGS) ×2 IMPLANT
DRSG AQUACEL AG ADV 3.5X14 (GAUZE/BANDAGES/DRESSINGS) ×2 IMPLANT
DRSG EMULSION OIL 3X16 NADH (GAUZE/BANDAGES/DRESSINGS) ×2 IMPLANT
ELECT REM PT RETURN 15FT ADLT (MISCELLANEOUS) ×2 IMPLANT
GAUZE SPONGE 4X4 12PLY STRL (GAUZE/BANDAGES/DRESSINGS) ×2 IMPLANT
GLOVE SRG 8 PF TXTR STRL LF DI (GLOVE) ×1 IMPLANT
GLOVE SURG ENC MOIS LTX SZ8.5 (GLOVE) ×4 IMPLANT
GLOVE SURG ENC TEXT LTX SZ7 (GLOVE) IMPLANT
GLOVE SURG ENC TEXT LTX SZ7.5 (GLOVE) ×4 IMPLANT
GLOVE SURG UNDER POLY LF SZ7.5 (GLOVE) IMPLANT
GLOVE SURG UNDER POLY LF SZ8 (GLOVE) ×2
GLOVE SURG UNDER POLY LF SZ8.5 (GLOVE) ×2 IMPLANT
GOWN SPEC L3 XXLG W/TWL (GOWN DISPOSABLE) ×4 IMPLANT
GOWN STRL REUS W/TWL LRG LVL3 (GOWN DISPOSABLE) IMPLANT
JET LAVAGE IRRISEPT WOUND (IRRIGATION / IRRIGATOR)
K-WIRE 2.0 (WIRE) ×2
K-WIRE FXSTD 280X2XNS SS (WIRE) ×1
KIT BASIN OR (CUSTOM PROCEDURE TRAY) ×2 IMPLANT
KIT TURNOVER KIT A (KITS) ×2 IMPLANT
KWIRE FXSTD 280X2XNS SS (WIRE) ×1 IMPLANT
LAVAGE JET IRRISEPT WOUND (IRRIGATION / IRRIGATOR) IMPLANT
MANIFOLD NEPTUNE II (INSTRUMENTS) ×2 IMPLANT
NS IRRIG 1000ML POUR BTL (IV SOLUTION) ×2 IMPLANT
PACK TOTAL JOINT (CUSTOM PROCEDURE TRAY) ×2 IMPLANT
PADDING CAST COTTON 6X4 STRL (CAST SUPPLIES) ×2 IMPLANT
PLATE RT DISTAL FEMUR NCB 18H (Plate) ×2 IMPLANT
PROTECTOR NERVE ULNAR (MISCELLANEOUS) ×2 IMPLANT
SCREW 5.0 80MM (Screw) ×4 IMPLANT
SCREW CORT NCB SELFTAP 5.0X42 (Screw) ×2 IMPLANT
SCREW CORTICAL NCB 5.0X40 (Screw) ×4 IMPLANT
SCREW NCB 5.0X38 (Screw) ×4 IMPLANT
SCREW NCB 5.0X55MM (Screw) ×2 IMPLANT
SCREW NCB 5.0X85MM (Screw) ×2 IMPLANT
SCREW UNI CORTICAL 5.0X14MM (Screw) ×2 IMPLANT
SCREW UNICORTICAL 5.0X14 (Screw) ×1 IMPLANT
STAPLER VISISTAT 35W (STAPLE) ×2 IMPLANT
STRIP CLOSURE SKIN 1/2X4 (GAUZE/BANDAGES/DRESSINGS) ×2 IMPLANT
SUT MNCRL AB 3-0 PS2 18 (SUTURE) ×2 IMPLANT
SUT MNCRL AB 4-0 PS2 18 (SUTURE) ×2 IMPLANT
SUT STRATAFIX PDO 1 14 VIOLET (SUTURE) ×2
SUT STRATFX PDO 1 14 VIOLET (SUTURE) ×1
SUT VIC AB 1 CT1 36 (SUTURE) ×4 IMPLANT
SUT VIC AB 2-0 CT1 27 (SUTURE) ×4
SUT VIC AB 2-0 CT1 TAPERPNT 27 (SUTURE) ×2 IMPLANT
SUTURE STRATFX PDO 1 14 VIOLET (SUTURE) ×1 IMPLANT
TOWEL OR 17X26 10 PK STRL BLUE (TOWEL DISPOSABLE) ×4 IMPLANT
WATER STERILE IRR 1000ML POUR (IV SOLUTION) ×2 IMPLANT

## 2020-10-28 NOTE — H&P (Signed)
PREOPERATIVE H&P  Chief Complaint: Right femur fracture  HPI: Cory Roberts is a 60 y.o. male who presents for preoperative history and physical with a diagnosis of Right femur fracture. Symptoms are rated as moderate to severe, and have been worsening.  This is significantly impairing activities of daily living.  He has elected for surgical management to prevent displacement.   Past Medical History:  Diagnosis Date   Arthritis 03/2011   Gout- Right foot   CVA (cerebral infarction)    GERD (gastroesophageal reflux disease)    Gout    Headache    Migraine Headaches   History of kidney stones    Raynaud's syndrome    Stroke [facility]) 2011   Past Surgical History:  Procedure Laterality Date   COLONOSCOPY N/A 03/22/2015   Procedure: COLONOSCOPY;  Surgeon: West Bali, MD;  Location: [facility];  Service: Endoscopy;  Laterality: N/A;  1215   ESOPHAGOGASTRODUODENOSCOPY N/A 12/22/2014   Dr. Darrick Penna: 1. dysphagia due to mid-esophageal web and uncontrolled reflux esophagitis. 2. small hiatal hernia 3. multiple small gastric and duodenal ulcers and mild duodenitis . Path with reactive gastropathy, negative H. pylori    ESOPHAGOGASTRODUODENOSCOPY N/A 03/22/2015   Procedure: ESOPHAGOGASTRODUODENOSCOPY (EGD);  Surgeon: West Bali, MD;  Location: [facility];  Service: Endoscopy;  Laterality: N/A;   HERNIA REPAIR Left 2007   INCISION AND DRAINAGE Right 12/18/2014   Procedure: INCISION AND DRAINAGE THUMB;  Surgeon: Vickki Hearing, MD;  Location: [facility] ORS;  Service: Orthopedics;  Laterality: Right;   INCISION AND DRAINAGE Right 12/21/2014   Procedure: INCISION AND DRAINAGE RIGHT THUMB;  Surgeon: Vickki Hearing, MD;  Location: [facility] ORS;  Service: Orthopedics;  Laterality: Right;   incision and drainage of thumb  12/18/14   TOTAL HIP ARTHROPLASTY Right 09/18/2016   TOTAL HIP ARTHROPLASTY Right 09/18/2016   Procedure: TOTAL HIP ARTHROPLASTY ANTERIOR APPROACH;  Surgeon: Samson Frederic, MD;   Location: [facility] OR;  Service: Orthopedics;  Laterality: Right;   TOTAL HIP ARTHROPLASTY Left 02/26/2017   Procedure: LEFT TOTAL HIP ARTHROPLASTY; ANTERIOR APPROACH;  Surgeon: Samson Frederic, MD;  Location: [facility] OR;  Service: Orthopedics;  Laterality: Left;   Social History   Socioeconomic History   Marital status: Married    Spouse name: Not on file   Number of children: Not on file   Years of education: Not on file   Highest education level: Not on file  Occupational History   Not on file  Tobacco Use   Smoking status: Former    Packs/day: 3.00    Years: 30.00    Pack years: 90.00    Types: Cigarettes    Quit date: 01/30/2005    Years since quitting: 15.7   Smokeless tobacco: Never  Vaping Use   Vaping Use: Never used  Substance and Sexual Activity   Alcohol use: Not Currently   Drug use: No   Sexual activity: Not Currently  Other Topics Concern   Not on file  Social History Narrative   Not on file   Social Determinants of Health   Financial Resource Strain: Not on file  Food Insecurity: Not on file  Transportation Needs: Not on file  Physical Activity: Not on file  Stress: Not on file  Social Connections: Not on file   Family History  Problem Relation Age of Onset   Cancer Mother    Hyperlipidemia Mother    Hypertension Mother    Heart attack Mother    Rheumatologic disease Mother  Cancer Father    Heart disease Father 80       Heart Disease before age 99   Heart attack Father    Diabetes Sister    Cancer Sister    Hyperlipidemia Sister    Hypertension Sister    Colon cancer Neg Hx    Allergies  Allergen Reactions   Codeine Nausea And Vomiting   Hydrocodone Nausea Only   Prior to Admission medications   Medication Sig Start Date End Date Taking? Authorizing Provider  allopurinol (ZYLOPRIM) 100 MG tablet Take 100 mg by mouth daily. 08/11/20  Yes [provider]  pantoprazole (PROTONIX) 40 MG tablet Take 1 tablet (40 mg total) by mouth daily.  04/21/20 04/21/21 Yes Carver, Hennie Duos, DO  sodium chloride (OCEAN) 0.65 % SOLN nasal spray Place 1 spray into both nostrils as needed for congestion (dryness).   Yes [provider]  aspirin 81 MG chewable tablet Chew 1 tablet (81 mg total) by mouth 2 (two) times daily. Patient not taking: No sig reported 02/27/17   Samson Frederic, MD  benzonatate (TESSALON) 100 MG capsule Take 2 capsules (200 mg total) by mouth 2 (two) times daily as needed for cough. Patient not taking: Reported on 10/25/2020 08/01/20   Bing Neighbors, FNP  ondansetron (ZOFRAN ODT) 4 MG disintegrating tablet Take 1 tablet (4 mg total) by mouth every 8 (eight) hours as needed for nausea or vomiting. Patient not taking: No sig reported 09/17/19   Cristina Gong, PA-C  ondansetron (ZOFRAN) 4 MG tablet Take 1 tablet (4 mg total) by mouth every 8 (eight) hours as needed for nausea or vomiting. Patient not taking: No sig reported 09/17/19   Cristina Gong, PA-C  oxyCODONE-acetaminophen (PERCOCET/ROXICET) 5-325 MG tablet Take 1 tablet by mouth every 6 (six) hours as needed for severe pain. Patient not taking: No sig reported 09/17/19   Cristina Gong, PA-C  oxyCODONE-acetaminophen (PERCOCET/ROXICET) 5-325 MG tablet Take 1 tablet by mouth every 6 (six) hours as needed for severe pain. Patient not taking: No sig reported 09/17/19   Cristina Gong, PA-C  predniSONE (DELTASONE) 20 MG tablet Take 2 tablets (40 mg total) by mouth daily with breakfast. Patient not taking: Reported on 10/25/2020 09/26/20   Bing Neighbors, FNP  tamsulosin (FLOMAX) 0.4 MG CAPS capsule Take 1 capsule (0.4 mg total) by mouth daily. Patient not taking: No sig reported 09/17/19   Cristina Gong, PA-C     Positive ROS: All other systems have been reviewed and were otherwise negative with the exception of those mentioned in the HPI and as above.  Physical Exam: General: Alert, no acute distress Cardiovascular: No pedal  edema Respiratory: No cyanosis, no use of accessory musculature GI: No organomegaly, abdomen is soft and non-tender Skin: No lesions in the area of chief complaint Neurologic: Sensation intact distally Psychiatric: Patient is competent for consent with normal mood and affect Lymphatic: No axillary or cervical lymphadenopathy  MUSCULOSKELETAL: RLE: healed anterior hip incision. No skin lesions. TTP over femur shaft. NVI.  Assessment: Right femur fracture  Plan: Plan for Procedure(s): OPEN REDUCTION INTERNAL FIXATION (ORIF) DISTAL FEMUR FRACTURE  The risks benefits and alternatives were discussed with the patient including but not limited to the risks of nonoperative treatment, versus surgical intervention including infection, bleeding, nerve injury,  blood clots, nonunion, malunion, cardiopulmonary complications, morbidity, mortality, among others, and they were willing to proceed.   Jonette Pesa, MD [phone]   10/28/2020 12:09 PM

## 2020-10-28 NOTE — Anesthesia Procedure Notes (Signed)
Spinal  Patient location during procedure: OR Start time: 10/28/2020 1:23 PM Reason for block: surgical anesthesia Staffing Performed: resident/CRNA  Anesthesiologist: Foster, Michael, MD Resident/CRNA: Harvey, Dustin E, CRNA Preanesthetic Checklist Completed: patient identified, IV checked, site marked, risks and benefits discussed, surgical consent, monitors and equipment checked, pre-op evaluation and timeout performed Spinal Block Patient position: sitting Prep: DuraPrep Patient monitoring: heart rate, continuous pulse ox, blood pressure and cardiac monitor Approach: midline Location: L3-4 Injection technique: single-shot Needle Needle type: Introducer and Pencan  Needle gauge: 24 G Needle length: 9 cm Assessment Sensory level: T4 Events: CSF return Additional Notes Negative paresthesia. Negative blood return. Positive free-flowing CSF. Expiration date of kit checked and confirmed. Patient tolerated procedure well, without complications.      

## 2020-10-28 NOTE — Discharge Instructions (Signed)
Dr. Samson Frederic Adult Hip & Knee Specialist [facility] [address], [address] [place], [place] [address] [phone]   POSTOPERATIVE DIRECTIONS    Rehabilitation, Guidelines Following Surgery   WEIGHT BEARING Weight bearing as tolerated with assist device (walker, cane, etc) as directed, use it as long as suggested by your surgeon or therapist, typically at least 4-6 weeks.   HOME CARE INSTRUCTIONS  Remove items at home which could result in a fall. This includes throw rugs or furniture in walking pathways.  Continue medications as instructed at time of discharge. You may have some home medications which will be placed on hold until you complete the course of blood thinner medication. 4 days after discharge, you may start showering. No tub baths or soaking your incisions. Do not put on socks or shoes without following the instructions of your caregivers.   Sit on chairs with arms. Use the chair arms to help push yourself up when arising.  Arrange for the use of a toilet seat elevator so you are not sitting low.  Walk with walker as instructed.  You may resume a sexual relationship in one month or when given the OK by your caregiver.  Use walker as long as suggested by your caregivers.  Avoid periods of inactivity such as sitting longer than an hour when not asleep. This helps prevent blood clots.  You may return to work once you are cleared by Designer, industrial/product.  Do not drive a car for 6 weeks or until released by your surgeon.  Do not drive while taking narcotics.  Wear elastic stockings for two weeks following surgery during the day but you may remove then at night.  Make sure you keep all of your appointments after your operation with all of your doctors and caregivers. You should call the office at the above phone number and make an appointment for approximately two weeks after the date of your surgery. Please pick up a stool softener and laxative for home  use as long as you are requiring pain medications. ICE to the affected hip every three hours for 30 minutes at a time and then as needed for pain and swelling. Continue to use ice on the hip for pain and swelling from surgery. You may notice swelling that will progress down to the foot and ankle.  This is normal after surgery.  Elevate the leg when you are not up walking on it.   It is important for you to complete the blood thinner medication as prescribed by your doctor. Continue to use the breathing machine which will help keep your temperature down.  It is common for your temperature to cycle up and down following surgery, especially at night when you are not up moving around and exerting yourself.  The breathing machine keeps your lungs expanded and your temperature down.  RANGE OF MOTION AND STRENGTHENING EXERCISES  These exercises are designed to help you keep full movement of your hip joint. Follow your caregiver's or physical therapist's instructions. Perform all exercises about fifteen times, three times per day or as directed. Exercise both hips, even if you have had only one joint replacement. These exercises can be done on a training (exercise) mat, on the floor, on a table or on a bed. Use whatever works the best and is most comfortable for you. Use music or television while you are exercising so that the exercises are a pleasant break in your day. This will make your life better with  the exercises acting as a break in routine you can look forward to.  Lying on your back, slowly slide your foot toward your buttocks, raising your knee up off the floor. Then slowly slide your foot back down until your leg is straight again.  Lying on your back spread your legs as far apart as you can without causing discomfort.  Lying on your side, raise your upper leg and foot straight up from the floor as far as is comfortable. Slowly lower the leg and repeat.  Lying on your back, tighten up the muscle in the  front of your thigh (quadriceps muscles). You can do this by keeping your leg straight and trying to raise your heel off the floor. This helps strengthen the largest muscle supporting your knee.  Lying on your back, tighten up the muscles of your buttocks both with the legs straight and with the knee bent at a comfortable angle while keeping your heel on the floor.   SKILLED REHAB INSTRUCTIONS: If the patient is transferred to a skilled rehab facility following release from the hospital, a list of the current medications will be sent to the facility for the patient to continue.  When discharged from the skilled rehab facility, please have the facility set up the patient's Home Health Physical Therapy prior to being released. Also, the skilled facility will be responsible for providing the patient with their medications at time of release from the facility to include their pain medication and their blood thinner medication. If the patient is still at the rehab facility at time of the two week follow up appointment, the skilled rehab facility will also need to assist the patient in arranging follow up appointment in our office and any transportation needs.  MAKE SURE YOU:  Understand these instructions.  Will watch your condition.  Will get help right away if you are not doing well or get worse.  Pick up stool softner and laxative for home use following surgery while on pain medications. Daily dry dressing changes as needed. In 4 days, you may remove your dressings and begin taking showers - no tub baths or soaking the incisions. Continue to use ice for pain and swelling after surgery. Do not use any lotions or creams on the incision until instructed by your surgeon.

## 2020-10-28 NOTE — Anesthesia Postprocedure Evaluation (Signed)
Anesthesia Post Note  Patient: Cory Roberts  Procedure(s) Performed: OPEN REDUCTION INTERNAL FIXATION (ORIF) DISTAL FEMUR FRACTURE (Right)     Patient location during evaluation: PACU Anesthesia Type: Spinal Level of consciousness: oriented and awake and alert Pain management: pain level controlled Vital Signs Assessment: post-procedure vital signs reviewed and stable Respiratory status: spontaneous breathing, respiratory function stable and nonlabored ventilation Cardiovascular status: blood pressure returned to baseline and stable Postop Assessment: no headache, no backache, no apparent nausea or vomiting, spinal receding and patient able to bend at knees Anesthetic complications: no   No notable events documented.  Last Vitals:  Vitals:   10/28/20 1600 10/28/20 1615  BP: (!) 102/55 119/67  Pulse: 64 65  Resp: 17 16  Temp: 36.7 C   SpO2: 91% 93%    Last Pain:  Vitals:   10/28/20 1600  TempSrc:   PainSc: Asleep                 Donnalee Cellucci A.

## 2020-10-28 NOTE — Anesthesia Preprocedure Evaluation (Signed)
Anesthesia Evaluation  Patient identified by MRN, date of birth, ID band Patient awake    Reviewed: Allergy & Precautions, NPO status , Patient's Chart, lab work & pertinent test results  Airway Mallampati: II  TM Distance: >3 FB Neck ROM: Full    Dental  (+) Dental Advisory Given, Missing, Poor Dentition,    Pulmonary former smoker,    Pulmonary exam normal breath sounds clear to auscultation       Cardiovascular negative cardio ROS Normal cardiovascular exam Rhythm:Regular Rate:Normal     Neuro/Psych  Headaches, CVA, No Residual Symptoms negative psych ROS   GI/Hepatic Neg liver ROS, PUD, GERD  Controlled,  Endo/Other  Gout  Renal/GU Renal disease  negative genitourinary   Musculoskeletal  (+) Arthritis , Osteoarthritis,  Right distal femur Fx   Abdominal   Peds  Hematology   Anesthesia Other Findings   Reproductive/Obstetrics                             Anesthesia Physical Anesthesia Plan  ASA: 2  Anesthesia Plan: Spinal   Post-op Pain Management:    Induction: Intravenous  PONV Risk Score and Plan: 2 and Treatment may vary due to age or medical condition  Airway Management Planned: Natural Airway and Simple Face Mask  Additional Equipment:   Intra-op Plan:   Post-operative Plan:   Informed Consent: I have reviewed the patients History and Physical, chart, labs and discussed the procedure including the risks, benefits and alternatives for the proposed anesthesia with the patient or authorized representative who has indicated his/her understanding and acceptance.     Dental advisory given  Plan Discussed with: CRNA and Anesthesiologist  Anesthesia Plan Comments:         Anesthesia Quick Evaluation

## 2020-10-28 NOTE — Plan of Care (Signed)

## 2020-10-28 NOTE — Op Note (Signed)
OPERATIVE REPORT   10/28/2020  3:38 PM  PATIENT:  Cory Roberts   SURGEON:  Bertram Savin, MD  ASSISTANT:  Cherlynn June, PA-C.   PREOPERATIVE DIAGNOSIS:  Right femoral shaft stress fracture.  POSTOPERATIVE DIAGNOSIS:  Same.  PROCEDURE:  OPEN REDUCTION INTERNAL FIXATION (ORIF) DISTAL FEMUR FRACTURE  ANESTHESIA:   Spinal and MAC.  ANTIBIOTICS:  2g Ancef.  IMPLANTS:  Zimmer NCB periprosthetic distal femur plate, size 18 hole. 5.0 mm distal bicortical interlocking screw with locking cap x5. 5.0 mm proximal bicortical interlocking screw with locking cap x4. 5.0 mm proximal unicortical screw x1.  SPECIMENS: None.  COMPLICATIONS: None.  DISPOSITION: Stable to PACU.  SURGICAL INDICATIONS:  Cory Roberts is a 60 y.o. male who underwent remote right total hip arthroplasty by myself several years ago.  He was doing very well until a couple of months ago, when he developed increasing atraumatic right thigh pain.  I worked him up for chronic, low-grade infection and loosening of his total hip replacement, which was negative.  The bone scan did show an area on the lateral cortex of the femoral shaft at the junction of the middle and distal thirds that very aggressively lit up.  X-rays of the right femur showed incomplete stress fracture of the lateral cortex at this area.  Due to the risk of displacement and the complexity that treatment would entail, we elected to prophylactically plate his femur in order to allow the fracture to heal.  The risks, benefits, and alternatives were discussed with the patient preoperatively including but not limited to the risks of infection, bleeding, nerve / blood vessel injury, malunion, nonunion, cardiopulmonary complications, the need for repeat surgery, among others, and the patient was willing to proceed.  PROCEDURE IN DETAIL: The patient was identified in the holding area using 2 identifiers.  The surgical site was marked by myself.  He was taken  the operating room, placed supine on the operating room table.  Spinal anesthesia was obtained.  Foley catheter was placed.  All bony prominences were well-padded.  Bump was placed under the right hip.  The right lower extremity was prepped and draped in the normal sterile surgical fashion.  Timeout was called, verifying side and site of surgery.  Patient did receive IV antibiotics within 60 minutes of beginning the procedure.  Fluoroscopy was brought in to identify his anatomy.  A 6 cm incision was created over the lateral aspect of the distal femur.  Blunt digital dissection was performed, and the IT band was split in line with fibers.  A Cobb was slid underneath the vastus lateralis.  An 18 hole plate was then selected and slid submuscularly up the femur using the jig.  Positioning of the plate was checked with AP and lateral fluoroscopy views.  The plate was held distally with a K wire and proximally with a drill bit just distal to the tip of the stem of the femoral component placed through a stab incision.  The plate was then compressed down to bone distally with 5.0 mm bicortical screws.  The plate was then compressed down to bone proximally with 5.0 mm bicortical screws through stab incisions.  I placed a total of 5 bicortical screws distal to the fracture.  I placed a total of 4 bicortical screws proximal to the fracture.  I placed a unicortical screw in the top pole of the plate.  Locking caps were then applied to all the screws with the exception of the unicortical screw.  Final AP and lateral fluoroscopy views were used to confirm placement of the hardware.  The wounds were then copiously irrigated with saline.  The IT band was closed with a combination of #1 Vicryl and #1 strata fix.  Deep dermal layer was closed with 2-0 Monocryl.  Skin was reapproximated with staples.  Dermabond was applied to the skin.  Once the glue was hardened, Aquacel dressing was placed.  Patient was then awakened from  anesthesia, taken to the PACU in stable condition.  Sponge, needle, and instrument counts were correct at the end of the case x2.  Please note that a surgical assistant was required for positioning of the patient, stabilization of the limb during the surgery, for anatomic placement of the hardware, and to assist with closure.  POSTOPERATIVE PLAN: Postoperatively, the patient will be admitted for overnight observation.  He may weight-bear as tolerated with a walker.  Standard IV antibiotic prophylaxis.  Begin aspirin for DVT prophylaxis beginning tomorrow morning.  Mobilize patient out of bed with physical therapy.  Plan for discharge tomorrow.

## 2020-10-28 NOTE — Transfer of Care (Signed)
Immediate Anesthesia Transfer of Care Note  Patient: Cory Roberts  Procedure(s) Performed: Procedure(s): OPEN REDUCTION INTERNAL FIXATION (ORIF) DISTAL FEMUR FRACTURE (Right)  Patient Location: PACU  Anesthesia Type:Spinal  Level of Consciousness: awake, alert  and oriented  Airway & Oxygen Therapy: Patient Spontanous Breathing  Post-op Assessment: Report given to RN and Post -op Vital signs reviewed and stable  Post vital signs: Reviewed and stable  Last Vitals:  Vitals:   10/28/20 1116  BP: (!) 132/96  Pulse: 72  Resp: 18  Temp: 36.9 C  SpO2: 99%    Complications: No apparent anesthesia complications

## 2020-10-29 DIAGNOSIS — Z7982 Long term (current) use of aspirin: Secondary | ICD-10-CM | POA: Diagnosis not present

## 2020-10-29 DIAGNOSIS — Z96643 Presence of artificial hip joint, bilateral: Secondary | ICD-10-CM | POA: Diagnosis not present

## 2020-10-29 DIAGNOSIS — Z8673 Personal history of transient ischemic attack (TIA), and cerebral infarction without residual deficits: Secondary | ICD-10-CM | POA: Diagnosis not present

## 2020-10-29 DIAGNOSIS — M84351A Stress fracture, right femur, initial encounter for fracture: Secondary | ICD-10-CM | POA: Diagnosis not present

## 2020-10-29 DIAGNOSIS — Z20822 Contact with and (suspected) exposure to covid-19: Secondary | ICD-10-CM | POA: Diagnosis not present

## 2020-10-29 DIAGNOSIS — Z87891 Personal history of nicotine dependence: Secondary | ICD-10-CM | POA: Diagnosis not present

## 2020-10-29 LAB — CBC
HCT: 41.2 % (ref 39.0–52.0)
Hemoglobin: 14.2 g/dL (ref 13.0–17.0)
MCH: 32.9 pg (ref 26.0–34.0)
MCHC: 34.5 g/dL (ref 30.0–36.0)
MCV: 95.4 fL (ref 80.0–100.0)
Platelets: 225 10*3/uL (ref 150–400)
RBC: 4.32 MIL/uL (ref 4.22–5.81)
RDW: 12.5 % (ref 11.5–15.5)
WBC: 11.3 10*3/uL — ABNORMAL HIGH (ref 4.0–10.5)
nRBC: 0 % (ref 0.0–0.2)

## 2020-10-29 LAB — BASIC METABOLIC PANEL
Anion gap: 10 (ref 5–15)
BUN: 18 mg/dL (ref 8–23)
CO2: 23 mmol/L (ref 22–32)
Calcium: 9.3 mg/dL (ref 8.9–10.3)
Chloride: 104 mmol/L (ref 98–111)
Creatinine, Ser: 1.13 mg/dL (ref 0.61–1.24)
GFR, Estimated: >60 mL/min (ref >60)
Glucose, Bld: 178 mg/dL — ABNORMAL HIGH (ref 70–99)
Potassium: 4.2 mmol/L (ref 3.5–5.1)
Sodium: 137 mmol/L (ref 135–145)

## 2020-10-29 MED ORDER — ASPIRIN 81 MG PO CHEW: 81.0000 mg | CHEWABLE_TABLET | Freq: Two times a day (BID) | ORAL | 1 refills | Status: DC

## 2020-10-29 MED ORDER — TAMSULOSIN HCL 0.4 MG PO CAPS: 0.4000 mg | ORAL_CAPSULE | Freq: Every day | ORAL | 0 refills | Status: DC

## 2020-10-29 MED ORDER — ONDANSETRON 4 MG PO TBDP: 4.0000 mg | ORAL_TABLET | Freq: Three times a day (TID) | ORAL | 0 refills | Status: DC | PRN

## 2020-10-29 MED ORDER — OXYCODONE-ACETAMINOPHEN 5-325 MG PO TABS
1.0000 | ORAL_TABLET | ORAL | 0 refills | Status: DC | PRN
Start: 2020-10-29 — End: 2021-06-09

## 2020-10-29 NOTE — Progress Notes (Signed)
Physical Therapy Treatment Patient Details Name: Cory Roberts MRN: [id number] DOB: [birth year] Today's Date: 10/29/2020   History of Present Illness Pt s/p R femur stress fx and now s/p ORIF.  Pt with hx of bil THR, CVA and Raynauds    PT Comments    Pt with improved pain control and progressing well with mobility.  Pt up to ambulate increased distance in hall, negotiated stairs, reviewed car transfers and reviewed written HEP.  Pt hopeful for dc home this date.   Recommendations for follow up therapy are one component of a multi-disciplinary discharge planning process, led by the attending physician.  Recommendations may be updated based on patient status, additional functional criteria and insurance authorization.  Follow Up Recommendations  No PT follow up     Equipment Recommendations  None recommended by PT    Recommendations for Other Services       Precautions / Restrictions Precautions Precautions: Fall Restrictions Weight Bearing Restrictions: No Other Position/Activity Restrictions: WBAT     Mobility  Bed Mobility               General bed mobility comments: Pt up in chair and requests back to same    Transfers Overall transfer level: Needs assistance Equipment used: Rolling walker (2 wheeled) Transfers: Sit to/from Stand Sit to Stand: Min guard;Supervision         General transfer comment: cues for LE management and use of UEs to self assist  Ambulation/Gait Ambulation/Gait assistance: Min guard;Supervision Gait Distance (Feet): 111 Feet Assistive device: Rolling walker (2 wheeled) Gait Pattern/deviations: Step-to pattern;Decreased step length - right;Decreased step length - left;Shuffle;Trunk flexed Gait velocity: decr   General Gait Details: cues for sequence, posture and position from RW   Stairs Stairs: Yes Stairs assistance: Min assist Stair Management: No rails;Step to pattern;Backwards;With walker Number of Stairs: 2 General stair  comments: single step twice bkwd with RW; min cues for sequence   Wheelchair Mobility    Modified Rankin (Stroke Patients Only)       Balance Overall balance assessment: Needs assistance Sitting-balance support: No upper extremity supported;Feet supported Sitting balance-[name]: Good     Standing balance support: Bilateral upper extremity supported Standing balance-[name]: Poor                              Cognition Arousal/Alertness: Awake/alert Behavior During Therapy: WFL for tasks assessed/performed Overall Cognitive Status: Within Functional Limits for tasks assessed                                        Exercises      General Comments        Pertinent Vitals/Pain Pain Assessment: 0-10 Pain Score: 4  Pain Location: R thigh with activity Pain Descriptors / Indicators: Aching;Grimacing;Sore Pain Intervention(s): Limited activity within patient's tolerance;Premedicated before session;Monitored during session    Home Living                      Prior Function            PT Goals (current goals can now be found in the care plan section) Acute Rehab PT Goals Patient Stated Goal: Less pain, regain IND PT Goal Formulation: With patient Time For Goal Achievement: 10/29/20 Potential to Achieve Goals: Good Progress towards PT goals: Progressing toward goals  Frequency    7X/week      PT Plan Current plan remains appropriate    Co-evaluation              AM-PAC PT "6 Clicks" Mobility   Outcome Measure  Help needed turning from your back to your side while in a flat bed without using bedrails?: A Little Help needed moving from lying on your back to sitting on the side of a flat bed without using bedrails?: A Little Help needed moving to and from a bed to a chair (including a wheelchair)?: A Little Help needed standing up from a chair using your arms (e.g., wheelchair or bedside chair)?: A  Little Help needed to walk in hospital room?: A Little Help needed climbing 3-5 steps with a railing? : A Little 6 Click Score: 18    End of Session Equipment Utilized During Treatment: Gait belt Activity Tolerance: Patient limited by pain;Patient tolerated treatment well Patient left: in chair;with call bell/phone within reach;with nursing/sitter in room;with family/visitor present Nurse Communication: Mobility status PT Visit Diagnosis: Difficulty in walking, not elsewhere classified (R26.2);Pain Pain - Right/Left: Right Pain - part of body: Leg     Time: 1610-9604 PT Time Calculation (min) (ACUTE ONLY): 30 min  Charges:  $Gait Training: 8-22 mins $Therapeutic Activity: 8-22 mins                     Mauro Kaufmann PT Acute Rehabilitation Services Pager [phone] Office [phone]    Cory Roberts 10/29/2020, 2:51 PM

## 2020-10-29 NOTE — Discharge Summary (Signed)
Physician Discharge Summary  Patient ID: Cory Roberts MRN: [id number] DOB/AGE: [birth year] 60 y.o.  Admit date: 10/28/2020 Discharge date: 10/29/2020  Admission Diagnoses:  Stress fracture of shaft of right femur  Discharge Diagnoses:  Principal Problem:   Stress fracture of shaft of right femur Active Problems:   Fracture, stress, femur, shaft, right, initial encounter   Past Medical History:  Diagnosis Date   Arthritis 03/2011   Gout- Right foot   CVA (cerebral infarction)    GERD (gastroesophageal reflux disease)    Gout    Headache    Migraine Headaches   History of kidney stones    Raynaud's syndrome    Stroke [facility]) 2011    Surgeries: Procedure(s): OPEN REDUCTION INTERNAL FIXATION (ORIF) DISTAL FEMUR FRACTURE on 10/28/2020   Consultants (if any):   Discharged Condition: Improved  Hospital Course: Cory Roberts is an 60 y.o. male who was admitted 10/28/2020 with a diagnosis of Stress fracture of shaft of right femur and went to the operating room on 10/28/2020 and underwent the above named procedures.    He was given perioperative antibiotics:  Anti-infectives (From admission, onward)    Start     Dose/Rate Route Frequency Ordered Stop   10/28/20 1800  ceFAZolin (ANCEF) IVPB 2g/100 mL premix        2 g 200 mL/hr over 30 Minutes Intravenous Every 6 hours 10/28/20 1700 10/29/20 0012   10/28/20 1100  ceFAZolin (ANCEF) IVPB 2g/100 mL premix        2 g 200 mL/hr over 30 Minutes Intravenous On call to O.R. 10/28/20 1045 10/28/20 1341     .  He was given sequential compression devices, early ambulation, and ASA for DVT prophylaxis.  He benefited maximally from the hospital stay and there were no complications.    Recent vital signs:  Vitals:   10/29/20 1003 10/29/20 1407  BP: 114/67 124/69  Pulse: 62 77  Resp: 18 17  Temp: 97.7 F (36.5 C) 97.9 F (36.6 C)  SpO2: 93% 96%    Recent laboratory studies:  Lab Results  Component Value Date   HGB 14.2  10/29/2020   HGB 15.6 10/27/2020   HGB 15.8 09/17/2019   Lab Results  Component Value Date   WBC 11.3 (H) 10/29/2020   PLT 225 10/29/2020   Lab Results  Component Value Date   INR 1.0 10/27/2020   Lab Results  Component Value Date   NA 137 10/29/2020   K 4.2 10/29/2020   CL 104 10/29/2020   CO2 23 10/29/2020   BUN 18 10/29/2020   CREATININE 1.13 10/29/2020   GLUCOSE 178 (H) 10/29/2020     WEIGHT BEARING   Weight bearing as tolerated with assist device (walker, cane, etc) as directed, use it as long as suggested by your surgeon or therapist, typically at least 4-6 weeks.   EXERCISES  Results after joint replacement surgery are often greatly improved when you follow the exercise, range of motion and muscle strengthening exercises prescribed by your doctor. Safety measures are also important to protect the joint from further injury. Any time any of these exercises cause you to have increased pain or swelling, decrease what you are doing until you are comfortable again and then slowly increase them. If you have problems or questions, call your caregiver or physical therapist for advice.   Rehabilitation is important following a joint replacement. After just a few days of immobilization, the muscles of the leg can become weakened and shrink (atrophy).  These exercises are designed to build up the tone and strength of the thigh and leg muscles and to improve motion. Often times heat used for twenty to thirty minutes before working out will loosen up your tissues and help with improving the range of motion but do not use heat for the first two weeks following surgery (sometimes heat can increase post-operative swelling).   These exercises can be done on a training (exercise) mat, on the floor, on a table or on a bed. Use whatever works the best and is most comfortable for you.    Use music or television while you are exercising so that the exercises are a pleasant break in your day. This  will make your life better with the exercises acting as a break in your routine that you can look forward to.   Perform all exercises about fifteen times, three times per day or as directed.  You should exercise both the operative leg and the other leg as well.  Exercises include:   Quad Sets - Tighten up the muscle on the front of the thigh (Quad) and hold for 5-10 seconds.   Straight Leg Raises - With your knee straight (if you were given a brace, keep it on), lift the leg to 60 degrees, hold for 3 seconds, and slowly lower the leg.  Perform this exercise against resistance later as your leg gets stronger.  Leg Slides: Lying on your back, slowly slide your foot toward your buttocks, bending your knee up off the floor (only go as far as is comfortable). Then slowly slide your foot back down until your leg is flat on the floor again.  Angel Wings: Lying on your back spread your legs to the side as far apart as you can without causing discomfort.  Hamstring Strength:  Lying on your back, push your heel against the floor with your leg straight by tightening up the muscles of your buttocks.  Repeat, but this time bend your knee to a comfortable angle, and push your heel against the floor.  You may put a pillow under the heel to make it more comfortable if necessary.   A rehabilitation program following joint replacement surgery can speed recovery and prevent re-injury in the future due to weakened muscles. Contact your doctor or a physical therapist for more information on knee rehabilitation.    CONSTIPATION  Constipation is defined medically as fewer than three stools per week and severe constipation as less than one stool per week.  Even if you have a regular bowel pattern at home, your normal regimen is likely to be disrupted due to multiple reasons following surgery.  Combination of anesthesia, postoperative narcotics, change in appetite and fluid intake all can affect your bowels.   YOU MUST use  at least one of the following options; they are listed in order of increasing strength to get the job done.  They are all available over the counter, and you may need to use some, POSSIBLY even all of these options:    Drink plenty of fluids (prune juice may be helpful) and high fiber foods Colace 100 mg by mouth twice a day  Senokot for constipation as directed and as needed Dulcolax (bisacodyl), take with full glass of water  Miralax (polyethylene glycol) once or twice a day as needed.  If you have tried all these things and are unable to have a bowel movement in the first 3-4 days after surgery call either your surgeon or your primary  doctor.    If you experience loose stools or diarrhea, hold the medications until you stool forms back up.  If your symptoms do not get better within 1 week or if they get worse, check with your doctor.  If you experience "the worst abdominal pain ever" or develop nausea or vomiting, please contact the office immediately for further recommendations for treatment.   ITCHING:  If you experience itching with your medications, try taking only a single pain pill, or even half a pain pill at a time.  You can also use Benadryl over the counter for itching or also to help with sleep.   TED HOSE STOCKINGS:  Use stockings on both legs until for at least 2 weeks or as directed by physician office. They may be removed at night for sleeping.  MEDICATIONS:  See your medication summary on the "After Visit Summary" that nursing will review with you.  You may have some home medications which will be placed on hold until you complete the course of blood thinner medication.  It is important for you to complete the blood thinner medication as prescribed.  PRECAUTIONS:  If you experience chest pain or shortness of breath - call 911 immediately for transfer to the hospital emergency department.   If you develop a fever greater that 101 F, purulent drainage from wound, increased  redness or drainage from wound, foul odor from the wound/dressing, or calf pain - CONTACT YOUR SURGEON.                                                   FOLLOW-UP APPOINTMENTS:  If you do not already have a post-op appointment, please call the office for an appointment to be seen by your surgeon.  Guidelines for how soon to be seen are listed in your "After Visit Summary", but are typically between 1-4 weeks after surgery.  OTHER INSTRUCTIONS:   Knee Replacement:  Do not place pillow under knee, focus on keeping the knee straight while resting. CPM instructions: 0-90 degrees, 2 hours in the morning, 2 hours in the afternoon, and 2 hours in the evening. Place foam block, curve side up under heel at all times except when in CPM or when walking.  DO NOT modify, tear, cut, or change the foam block in any way.   MAKE SURE YOU:  Understand these instructions.  Get help right away if you are not doing well or get worse.    Thank you for letting us be a part of your medical care team.  It is a privilege we respect greatly.  We hope these instructions will help you stay on track for a fast and full recovery!   Diagnostic Studies: NM Bone Scan 3 Phase  Result Date: 10/16/2020 CLINICAL DATA:  Right hip pain. EXAM: NUCLEAR MEDICINE 3-PHASE BONE SCAN TECHNIQUE: Radionuclide angiographic images, immediate static blood pool images, and 3-hour delayed static images were obtained of the hips after intravenous injection of radiopharmaceutical. RADIOPHARMACEUTICALS:  21.3 mCi Tc-4m MDP IV COMPARISON:  X-rays of the right hip September 26, 2020 FINDINGS: Vascular phase: Normal Blood pool phase: No abnormality on the left. On the right, there is focal increased blood pool uptake in the region of the mid to distal right femur. Delayed phase: No abnormality on the left. There is focal uptake in the mid to distal  right femoral diaphysis. This uptake is below the level of the femoral component on the right. IMPRESSION:  Increased blood pool and delayed uptake in the mid to distal right femoral diaphysis below the level of the femoral component on the right. The increased uptake could be seen with fracture, infection, or tumor. Recommend an x-ray of the right femur for better evaluation. These results will be called to the ordering clinician or representative by the Radiologist Assistant, and communication documented in the PACS or [employer]. Electronically Signed   By: Gerome Sam III M.D.   On: 10/16/2020 09:28   DG C-Arm 1-60 Min-No Report  Result Date: 10/28/2020 Fluoroscopy was utilized by the requesting physician.  No radiographic interpretation.   DG FEMUR, MIN 2 VIEWS RIGHT  Result Date: 10/28/2020 CLINICAL DATA:  Femur surgery EXAM: RIGHT FEMUR 2 VIEWS COMPARISON:  None. FINDINGS: Four low resolution intraoperative spot views of the right femur. Total fluoroscopy time was 2 minutes. Partially visualized right hip replacement. Surgical plate and fixating screws within the femur IMPRESSION: Intraoperative fluoroscopic assistance provided during placement of femoral hardware Electronically Signed   By: Jasmine Pang M.D.   On: 10/28/2020 16:57   DG FEMUR PORT, MIN 2 VIEWS RIGHT  Result Date: 10/28/2020 CLINICAL DATA:  Status post IM nail EXAM: RIGHT FEMUR PORTABLE 2 VIEW COMPARISON:  09/26/2020 FINDINGS: Previous right hip replacement with intact hardware and normal alignment. Interval surgical plate and multiple screw fixation of the right femur. About 5 mm gap between the surgical plate and femoral cortex at the level of distal shaft and metaphysis. Anatomic alignment. Gas in the soft tissues consistent with recent surgery. IMPRESSION: Interval surgical plate and fixating screws in the femur with expected postsurgical change Electronically Signed   By: Jasmine Pang M.D.   On: 10/28/2020 16:56    Disposition: Discharge disposition: 01-Home or Self Care       Discharge Instructions     Call  MD / Call 911   Complete by: As directed    If you experience chest pain or shortness of breath, CALL 911 and be transported to the hospital emergency room.  If you develope a fever above 101 F, pus (white drainage) or increased drainage or redness at the wound, or calf pain, call your surgeon's office.   Constipation Prevention   Complete by: As directed    Drink plenty of fluids.  Prune juice may be helpful.  You may use a stool softener, such as Colace (over the counter) 100 mg twice a day.  Use MiraLax (over the counter) for constipation as needed.   Diet - low sodium heart healthy   Complete by: As directed    Driving restrictions   Complete by: As directed    No driving for 6 weeks   Increase activity slowly as tolerated   Complete by: As directed    Lifting restrictions   Complete by: As directed    No lifting for 6 weeks   Post-operative opioid taper instructions:   Complete by: As directed    POST-OPERATIVE OPIOID TAPER INSTRUCTIONS: It is important to wean off of your opioid medication as soon as possible. If you do not need pain medication after your surgery it is ok to stop day one. Opioids include: Codeine, Hydrocodone(Norco, Vicodin), Oxycodone(Percocet, oxycontin) and hydromorphone amongst others.  Long term and even short term use of opiods can cause: Increased pain response Dependence Constipation Depression Respiratory depression And more.  Withdrawal symptoms can include Flu  like symptoms Nausea, vomiting And more Techniques to manage these symptoms Hydrate well Eat regular healthy meals Stay active Use relaxation techniques(deep breathing, meditating, yoga) Do Not substitute Alcohol to help with tapering If you have been on opioids for less than two weeks and do not have pain than it is ok to stop all together.  Plan to wean off of opioids This plan should start within one week post op of your joint replacement. Maintain the same interval or time between  taking each dose and first decrease the dose.  Cut the total daily intake of opioids by one tablet each day Next start to increase the time between doses. The last dose that should be eliminated is the evening dose.           Follow-up Information     Makala Fetterolf, Arlys John, MD. Schedule an appointment as soon as possible for a visit in 2 week(s).   Specialty: Orthopedic Surgery Why: For suture removal Contact information: [address] [place] [place] [address] [place] [address] [phone]                  Signed: Iline Oven Kitt Ledet 10/29/2020, 2:55 PM

## 2020-10-29 NOTE — Progress Notes (Signed)
    Subjective:  Patient reports pain as mild.  Denies N/V/CP/SOB. No c/o  Objective:   VITALS:   Vitals:   10/29/20 0200 10/29/20 0556 10/29/20 1003 10/29/20 1407  BP: 114/80 131/80 114/67 124/69  Pulse: 70 (!) 59 62 77  Resp: 20 18 18 17   Temp: (!) 97.3 F (36.3 C) (!) 97.5 F (36.4 C) 97.7 F (36.5 C) 97.9 F (36.6 C)  TempSrc: Axillary Axillary    SpO2: 100% 98% 93% 96%  Weight:      Height:        NAD ABD soft Sensation intact distally Intact pulses distally Dorsiflexion/Plantar flexion intact Incision: dressing C/D/I Compartment soft   Lab Results  Component Value Date   WBC 11.3 (H) 10/29/2020   HGB 14.2 10/29/2020   HCT 41.2 10/29/2020   MCV 95.4 10/29/2020   PLT 225 10/29/2020   BMET    Component Value Date/Time   NA 137 10/29/2020 0331   K 4.2 10/29/2020 0331   CL 104 10/29/2020 0331   CO2 23 10/29/2020 0331   GLUCOSE 178 (H) 10/29/2020 0331   BUN 18 10/29/2020 0331   CREATININE 1.13 10/29/2020 0331   CALCIUM 9.3 10/29/2020 0331   GFRNONAA >60 10/29/2020 0331     Assessment/Plan: 1 Day Post-Op   Principal Problem:   Stress fracture of shaft of right femur Active Problems:   Fracture, stress, femur, shaft, right, initial encounter   WBAT with walker DVT ppx: Aspirin, SCDs, TEDS PO pain control PT/OT Dispo: D/C home with HEP    10/31/2020 Jalea Bronaugh 10/29/2020, 4:01 PM   10/31/2020, MD [phone] [facility] is now [facility]  [place] [address], [address], [place], [place] Phone: [phone] [email or web address] Facebook  [phone]

## 2020-10-29 NOTE — TOC Transition Note (Signed)
Transition of Care [facility]) - CM/SW Discharge Note   Patient Details  Name: Cory Roberts MRN: [id number] Date of Birth: 22-Oct-1960  [facility] of [facility]) CM/SW Contact:  Lennart Pall, LCSW Phone Number: 10/29/2020, 1:26 PM   Clinical Narrative:    Met with pt and wife to review if any dc needs.  Pt reports that he has all needed DME from prior hip surgeries.  Plans for HEP.  No TOC needs.   Final next level of care: Home/Self Care Barriers to Discharge: No Barriers Identified   Patient Goals and CMS Choice Patient states their goals for this hospitalization and ongoing recovery are:: hopes to dc home today      Discharge Placement                       Discharge Plan and Services                DME Arranged: N/A DME Agency: NA                  Social Determinants of Health (SDOH) Interventions     Readmission Risk Interventions No flowsheet data found.

## 2020-10-29 NOTE — Evaluation (Signed)
Physical Therapy Evaluation Patient Details Name: Cory Roberts MRN: [id number] DOB: 1960-07-15 Today's Date: 10/29/2020  History of Present Illness  Pt s/p R femur stress fx and now s/p ORIF.  Pt with hx of bil THR, CVA and Raynauds  Clinical Impression  Pt admitted as above and presenting with functional mobility limitations 2* decreased R LE strength/ROM and significant post op pain with activity.  Pt should progress to dc home with family assist.     Recommendations for follow up therapy are one component of a multi-disciplinary discharge planning process, led by the attending physician.  Recommendations may be updated based on patient status, additional functional criteria and insurance authorization.  Follow Up Recommendations No PT follow up    Equipment Recommendations  None recommended by PT    Recommendations for Other Services       Precautions / Restrictions Precautions Precautions: Fall Restrictions Weight Bearing Restrictions: No Other Position/Activity Restrictions: WBAT      Mobility  Bed Mobility Overal bed mobility: Needs Assistance Bed Mobility: Supine to Sit     Supine to sit: Min guard     General bed mobility comments: min guard for R LE 2* pain    Transfers Overall transfer level: Needs assistance Equipment used: Rolling walker (2 wheeled) Transfers: Sit to/from Stand Sit to Stand: Min guard         General transfer comment: Steady assist with cues for LE management and use of UEs to self assist  Ambulation/Gait Ambulation/Gait assistance: Min assist;Min guard Gait Distance (Feet): 56 Feet Assistive device: Rolling walker (2 wheeled) Gait Pattern/deviations: Step-to pattern;Decreased step length - right;Decreased step length - left;Shuffle;Trunk flexed Gait velocity: decr   General Gait Details: cues for sequence, posture and position from [employer]            Wheelchair Mobility    Modified Rankin (Stroke Patients Only)        Balance Overall balance assessment: Needs assistance Sitting-balance support: No upper extremity supported;Feet supported Sitting balance-[name]: Good     Standing balance support: Bilateral upper extremity supported Standing balance-[name]: Poor                               Pertinent Vitals/Pain Pain Assessment: 0-10 Pain Score: 9  Pain Location: R thigh with activity Pain Descriptors / Indicators: Aching;Grimacing;Sore Pain Intervention(s): Limited activity within patient's tolerance;Monitored during session;Premedicated before session;Ice applied;Patient requesting pain meds-RN notified    Home Living Family/patient expects to be discharged to:: Private residence Living Arrangements: Spouse/significant other Available Help at Discharge: Family Type of Home: Mobile home Home Access: Stairs to enter Entrance Stairs-Rails: None Entrance Stairs-Number of Steps: 1 Home Layout: One level Home Equipment: Environmental consultant - 2 wheels;Cane - single point;Bedside commode;Grab bars - tub/shower      Prior Function Level of Independence: Independent               Hand Dominance   Dominant Hand: Left    Extremity/Trunk Assessment   Upper Extremity Assessment Upper Extremity Assessment: Overall WFL for tasks assessed    Lower Extremity Assessment Lower Extremity Assessment: RLE deficits/detail RLE Deficits / Details: AAROM to 80 flex at hip and knee    Cervical / Trunk Assessment Cervical / Trunk Assessment: Normal  Communication   Communication: No difficulties  Cognition Arousal/Alertness: Awake/alert Behavior During Therapy: WFL for tasks assessed/performed Overall Cognitive Status: Within Functional Limits for tasks assessed  General Comments      Exercises General Exercises - Lower Extremity Ankle Circles/Pumps: AROM;Both;20 reps;Supine Quad Sets: AROM;Both;10 reps;Supine Heel  Slides: AAROM;Right;10 reps;Supine Hip ABduction/ADduction: AAROM;Right;10 reps;Supine   Assessment/Plan    PT Assessment Patient needs continued PT services  PT Problem List Decreased strength;Decreased range of motion;Decreased activity tolerance;Decreased balance;Decreased mobility;Decreased knowledge of use of DME;Pain       PT Treatment Interventions DME instruction;Gait training;Stair training;Functional mobility training;Therapeutic activities;Therapeutic exercise;Balance training;Patient/family education    PT Goals (Current goals can be found in the Care Plan section)  Acute Rehab PT Goals Patient Stated Goal: Less pain, regain IND PT Goal Formulation: With patient Time For Goal Achievement: 10/29/20 Potential to Achieve Goals: Good    Frequency 7X/week   Barriers to discharge        Co-evaluation               AM-PAC PT "6 Clicks" Mobility  Outcome Measure Help needed turning from your back to your side while in a flat bed without using bedrails?: A Little Help needed moving from lying on your back to sitting on the side of a flat bed without using bedrails?: A Little Help needed moving to and from a bed to a chair (including a wheelchair)?: A Little Help needed standing up from a chair using your arms (e.g., wheelchair or bedside chair)?: A Little Help needed to walk in hospital room?: A Little Help needed climbing 3-5 steps with a railing? : A Lot 6 Click Score: 17    End of Session Equipment Utilized During Treatment: Gait belt Activity Tolerance: Patient limited by pain;Patient tolerated treatment well Patient left: in chair;with call bell/phone within reach;with nursing/sitter in room;with family/visitor present Nurse Communication: Mobility status PT Visit Diagnosis: Difficulty in walking, not elsewhere classified (R26.2);Pain Pain - Right/Left: Right Pain - part of body: Leg    Time: 9417-4081 PT Time Calculation (min) (ACUTE ONLY): 28  min   Charges:   PT Evaluation $PT Eval Low Complexity: 1 Low PT Treatments $Therapeutic Exercise: 8-22 mins        Cory Roberts PT Acute Rehabilitation Services Pager [phone] Office [phone]   Cory Roberts 10/29/2020, 9:26 AM

## 2020-11-02 ENCOUNTER — Encounter (HOSPITAL_COMMUNITY): Payer: Self-pay | Admitting: Orthopedic Surgery

## 2020-11-12 DIAGNOSIS — S72401D Unspecified fracture of lower end of right femur, subsequent encounter for closed fracture with routine healing: Secondary | ICD-10-CM | POA: Diagnosis not present

## 2020-12-10 DIAGNOSIS — S72401D Unspecified fracture of lower end of right femur, subsequent encounter for closed fracture with routine healing: Secondary | ICD-10-CM | POA: Diagnosis not present

## 2020-12-10 DIAGNOSIS — Z5181 Encounter for therapeutic drug level monitoring: Secondary | ICD-10-CM | POA: Diagnosis not present

## 2020-12-17 DIAGNOSIS — M25461 Effusion, right knee: Secondary | ICD-10-CM | POA: Diagnosis not present

## 2020-12-31 DIAGNOSIS — S72031D Displaced midcervical fracture of right femur, subsequent encounter for closed fracture with routine healing: Secondary | ICD-10-CM | POA: Diagnosis not present

## 2021-02-11 DIAGNOSIS — S72031D Displaced midcervical fracture of right femur, subsequent encounter for closed fracture with routine healing: Secondary | ICD-10-CM | POA: Diagnosis not present

## 2021-02-11 DIAGNOSIS — M1712 Unilateral primary osteoarthritis, left knee: Secondary | ICD-10-CM | POA: Diagnosis not present

## 2021-03-25 DIAGNOSIS — M1712 Unilateral primary osteoarthritis, left knee: Secondary | ICD-10-CM | POA: Diagnosis not present

## 2021-03-25 DIAGNOSIS — M170 Bilateral primary osteoarthritis of knee: Secondary | ICD-10-CM | POA: Diagnosis not present

## 2021-04-03 ENCOUNTER — Ambulatory Visit (INDEPENDENT_AMBULATORY_CARE_PROVIDER_SITE_OTHER): Payer: BC Managed Care – PPO

## 2021-04-03 ENCOUNTER — Other Ambulatory Visit: Payer: Self-pay

## 2021-04-03 ENCOUNTER — Ambulatory Visit
Admission: RE | Admit: 2021-04-03 | Discharge: 2021-04-03 | Disposition: A | Discharge status: Home / Self Care | Payer: BC Managed Care – PPO | Source: Ambulatory Visit | Attending: Urgent Care | Admitting: Urgent Care

## 2021-04-03 VITALS — BP 148/86 | HR 102 | Temp 98.9°F | Resp 18

## 2021-04-03 DIAGNOSIS — R051 Acute cough: Secondary | ICD-10-CM | POA: Diagnosis not present

## 2021-04-03 DIAGNOSIS — R0602 Shortness of breath: Secondary | ICD-10-CM

## 2021-04-03 DIAGNOSIS — Z87891 Personal history of nicotine dependence: Secondary | ICD-10-CM

## 2021-04-03 DIAGNOSIS — J209 Acute bronchitis, unspecified: Secondary | ICD-10-CM

## 2021-04-03 DIAGNOSIS — R0989 Other specified symptoms and signs involving the circulatory and respiratory systems: Secondary | ICD-10-CM

## 2021-04-03 DIAGNOSIS — J4520 Mild intermittent asthma, uncomplicated: Secondary | ICD-10-CM

## 2021-04-03 DIAGNOSIS — R059 Cough, unspecified: Secondary | ICD-10-CM

## 2021-04-03 MED ORDER — ALBUTEROL SULFATE HFA 108 (90 BASE) MCG/ACT IN AERS: 1.0000 | INHALATION_SPRAY | Freq: Four times a day (QID) | RESPIRATORY_TRACT | 0 refills | Status: DC | PRN

## 2021-04-03 MED ORDER — PROMETHAZINE-DM 6.25-15 MG/5ML PO SYRP: 5.0000 mL | ORAL_SOLUTION | Freq: Every evening | ORAL | 0 refills | Status: DC | PRN

## 2021-04-03 MED ORDER — PREDNISONE 50 MG PO TABS: 50.0000 mg | ORAL_TABLET | Freq: Every day | ORAL | 0 refills | Status: DC

## 2021-04-03 MED ORDER — BENZONATATE 100 MG PO CAPS: 100.0000 mg | ORAL_CAPSULE | Freq: Three times a day (TID) | ORAL | 0 refills | Status: DC | PRN

## 2021-04-03 MED ORDER — METHYLPREDNISOLONE SODIUM SUCC 125 MG IJ SOLR
125.0000 mg | Freq: Once | INTRAMUSCULAR | Status: AC
  Administered 2021-04-03: 125 mg via INTRAMUSCULAR

## 2021-04-03 NOTE — ED Provider Notes (Signed)
?[facility] ? ? ?MRN: [id number] DOB: [birth year] ? ?Subjective:  ? ?Cory Roberts is a 61 y.o. male presenting for 2-week history of persistent and worsening chest congestion, coughing, shortness of breath worse when he lays down.  He has also had some sinus congestion.  He is a former smoker, quit 20 years ago.  He does have a history of mild intermittent asthma as well.  No chest pain.  No fevers, body aches.  Creatinine and GFR levels are normal. ? ?No current facility-administered medications for this encounter. ? ?Current Outpatient Medications:  ?  allopurinol (ZYLOPRIM) 100 MG tablet, Take 100 mg by mouth daily., Disp: , Rfl:  ?  aspirin 81 MG chewable tablet, Chew 1 tablet (81 mg total) by mouth 2 (two) times daily., Disp: 60 tablet, Rfl: 1 ?  ondansetron (ZOFRAN ODT) 4 MG disintegrating tablet, Take 1 tablet (4 mg total) by mouth every 8 (eight) hours as needed for nausea or vomiting., Disp: 10 tablet, Rfl: 0 ?  oxyCODONE-acetaminophen (PERCOCET/ROXICET) 5-325 MG tablet, Take 1 tablet by mouth every 4 (four) hours as needed for severe pain., Disp: 30 tablet, Rfl: 0 ?  pantoprazole (PROTONIX) 40 MG tablet, Take 1 tablet (40 mg total) by mouth daily., Disp: 90 tablet, Rfl: 3 ?  sodium chloride (OCEAN) 0.65 % SOLN nasal spray, Place 1 spray into both nostrils as needed for congestion (dryness)., Disp: , Rfl:  ?  tamsulosin (FLOMAX) 0.4 MG CAPS capsule, Take 1 capsule (0.4 mg total) by mouth daily., Disp: 14 capsule, Rfl: 0  ? ?Allergies  ?Allergen Reactions  ? Codeine Nausea And Vomiting  ? Hydrocodone Nausea Only  ? ? ?Past Medical History:  ?Diagnosis Date  ? Arthritis 03/2011  ? Gout- Right foot  ? CVA (cerebral infarction)   ? GERD (gastroesophageal reflux disease)   ? Gout   ? Headache   ? Migraine Headaches  ? History of kidney stones   ? Raynaud's syndrome   ? Stroke [facility]) 2011  ?  ? ?Past Surgical History:  ?Procedure Laterality Date  ? COLONOSCOPY N/A 03/22/2015  ? Procedure:  COLONOSCOPY;  Surgeon: West Bali, MD;  [facility];  Service: Endoscopy;  Laterality: N/A;  1215  ? ESOPHAGOGASTRODUODENOSCOPY N/A 12/22/2014  ? Dr. Darrick Penna: 1. dysphagia due to mid-esophageal web and uncontrolled reflux esophagitis. 2. small hiatal hernia 3. multiple small gastric and duodenal ulcers and mild duodenitis . Path with reactive gastropathy, negative H. pylori   ? ESOPHAGOGASTRODUODENOSCOPY N/A 03/22/2015  ? Procedure: ESOPHAGOGASTRODUODENOSCOPY (EGD);  Surgeon: West Bali, MD;  [facility];  Service: Endoscopy;  Laterality: N/A;  ? HERNIA REPAIR Left 2007  ? INCISION AND DRAINAGE Right 12/18/2014  ? Procedure: INCISION AND DRAINAGE THUMB;  Surgeon: Vickki Hearing, MD;  Location: [facility];  Service: Orthopedics;  Laterality: Right;  ? INCISION AND DRAINAGE Right 12/21/2014  ? Procedure: INCISION AND DRAINAGE RIGHT THUMB;  Surgeon: Vickki Hearing, MD;  Location: [facility];  Service: Orthopedics;  Laterality: Right;  ? incision and drainage of thumb  12/18/14  ? ORIF FEMUR FRACTURE Right 10/28/2020  ? Procedure: OPEN REDUCTION INTERNAL FIXATION (ORIF) DISTAL FEMUR FRACTURE;  Surgeon: Samson Frederic, MD;  Location: [facility];  Service: Orthopedics;  Laterality: Right;  ? TOTAL HIP ARTHROPLASTY Right 09/18/2016  ? TOTAL HIP ARTHROPLASTY Right 09/18/2016  ? Procedure: TOTAL HIP ARTHROPLASTY ANTERIOR APPROACH;  Surgeon: Samson Frederic, MD;  Location: [facility];  Service: Orthopedics;  Laterality: Right;  ?  TOTAL HIP ARTHROPLASTY Left 02/26/2017  ? Procedure: LEFT TOTAL HIP ARTHROPLASTY; ANTERIOR APPROACH;  Surgeon: Samson Frederic, MD;  Location: [facility];  Service: Orthopedics;  Laterality: Left;  ? ? ?Family History  ?Problem Relation Age of Onset  ? Cancer Mother   ? Hyperlipidemia Mother   ? Hypertension Mother   ? Heart attack Mother   ? Rheumatologic disease Mother   ? Cancer Father   ? Heart disease Father 33  ?     Heart Disease before age 68  ? Heart attack Father   ? Diabetes  Sister   ? Cancer Sister   ? Hyperlipidemia Sister   ? Hypertension Sister   ? Colon cancer Neg Hx   ? ? ?Social History  ? ?Tobacco Use  ? Smoking status: Former  ?  Packs/day: 3.00  ?  Years: 30.00  ?  Pack years: 90.00  ?  Types: Cigarettes  ?  Quit date: 01/30/2005  ?  Years since quitting: 16.1  ? Smokeless tobacco: Never  ?Vaping Use  ? Vaping Use: Never used  ?Substance Use Topics  ? Alcohol use: Not Currently  ? Drug use: No  ? ? ?ROS ? ? ?Objective:  ? ?Vitals: ?BP (!) 148/86 (BP Location: Right Arm)   Pulse (!) 102   Temp 98.9 ?F (37.2 ?C) (Oral)   Resp 18   SpO2 92%  ? ?Physical Exam ?Constitutional:   ?   General: He is not in acute distress. ?   Appearance: Normal appearance. He is well-developed. He is not ill-appearing, toxic-appearing or diaphoretic.  ?HENT:  ?   Head: Normocephalic and atraumatic.  ?   Right Ear: External ear normal.  ?   Left Ear: External ear normal.  ?   Nose: Nose normal.  ?   Mouth/Throat:  ?   Mouth: Mucous membranes are moist.  ?Eyes:  ?   General: No scleral icterus.    ?   Right eye: No discharge.     ?   Left eye: No discharge.  ?   Extraocular Movements: Extraocular movements intact.  ?Cardiovascular:  ?   Rate and Rhythm: Normal rate and regular rhythm.  ?   Heart sounds: Normal heart sounds. No murmur heard. ?  No friction rub. No gallop.  ?Pulmonary:  ?   Effort: Pulmonary effort is normal. No respiratory distress.  ?   Breath sounds: No stridor. Examination of the right-middle field reveals rhonchi. Examination of the left-middle field reveals rhonchi. Examination of the right-lower field reveals wheezing and rhonchi. Examination of the left-lower field reveals wheezing and rhonchi. Wheezing and rhonchi present. No rales.  ?Neurological:  ?   Mental Status: He is alert and oriented to person, place, and time.  ?Psychiatric:     ?   Mood and Affect: Mood normal.     ?   Behavior: Behavior normal.     ?   Thought Content: Thought content normal.  ? ? ?CLINICAL DATA:  Cough and congestion. ? ?EXAM: ?CHEST - 2 VIEW ? ?COMPARISON: 05/10/2017 ? ?FINDINGS: ?Heart size is normal. No pleural effusion or edema. The lungs are ?hyperinflated with progressive chronic bronchitic changes noted ?bilaterally. No superimposed pleural effusion or airspace ?consolidation. ? ?IMPRESSION: ?1. Progressive chronic bronchitic changes identified bilaterally. No ?airspace consolidation. ? ? ?Electronically Signed ?By: Signa Kell M.D. ?On: 04/03/2021 09:23  ? ? ?Assessment and Plan :  ? ?PDMP not reviewed this encounter. ? ?1. Acute bronchitis, unspecified organism   ?2.  Chest congestion   ?3. Acute cough   ?4. Shortness of breath   ?5. Former smoker   ?6. Mild intermittent asthma without complication   ? ?We will manage for an acute bronchitis with IM Solu-Medrol in clinic, followed by an oral prednisone course.  Recommend supportive care otherwise including cough medication, albuterol inhaler.  As there is no focal signs of pneumonia will defer antibiotic use. Counseled patient on potential for adverse effects with medications prescribed/recommended today, ER and return-to-clinic precautions discussed, patient verbalized understanding. ? ?  ?Wallis Bamberg, PA-C ?04/03/21 0940 ? ?

## 2021-04-03 NOTE — ED Triage Notes (Signed)
Cough, chest congestion, nasal congestion x 2 weeks.  States symptoms are worse at night when laying down ?

## 2021-04-14 ENCOUNTER — Encounter: Payer: Self-pay | Admitting: Internal Medicine

## 2021-05-09 ENCOUNTER — Other Ambulatory Visit: Payer: Self-pay | Admitting: Internal Medicine

## 2021-05-09 DIAGNOSIS — Z1331 Encounter for screening for depression: Secondary | ICD-10-CM | POA: Diagnosis not present

## 2021-05-09 DIAGNOSIS — Z6828 Body mass index (BMI) 28.0-28.9, adult: Secondary | ICD-10-CM | POA: Diagnosis not present

## 2021-05-09 DIAGNOSIS — E663 Overweight: Secondary | ICD-10-CM | POA: Diagnosis not present

## 2021-05-09 DIAGNOSIS — K219 Gastro-esophageal reflux disease without esophagitis: Secondary | ICD-10-CM

## 2021-05-09 DIAGNOSIS — B029 Zoster without complications: Secondary | ICD-10-CM | POA: Diagnosis not present

## 2021-05-09 NOTE — Telephone Encounter (Signed)
Last ov 04/21/20 ?

## 2021-06-09 ENCOUNTER — Ambulatory Visit: Payer: BC Managed Care – PPO | Admitting: Internal Medicine

## 2021-06-09 ENCOUNTER — Encounter: Payer: Self-pay | Admitting: Internal Medicine

## 2021-06-09 VITALS — BP 133/75 | HR 74 | Temp 97.5°F | Ht 68.0 in | Wt 199.4 lb

## 2021-06-09 DIAGNOSIS — K219 Gastro-esophageal reflux disease without esophagitis: Secondary | ICD-10-CM | POA: Diagnosis not present

## 2021-06-09 DIAGNOSIS — Z1211 Encounter for screening for malignant neoplasm of colon: Secondary | ICD-10-CM | POA: Diagnosis not present

## 2021-06-09 MED ORDER — PANTOPRAZOLE SODIUM 40 MG PO TBEC: 40.0000 mg | DELAYED_RELEASE_TABLET | Freq: Every day | ORAL | 3 refills | Status: DC

## 2021-06-09 NOTE — Progress Notes (Signed)
? ? ?Referring Provider: Avis Epley, PA* ?Primary Care Physician:  Avis Epley, PA-C ?Primary GI:  Dr. Marletta Lor ? ?Chief Complaint  ?Patient presents with  ? Follow-up  ?  Refills on Pantoprazole  ? ? ?HPI:   ?Cory Roberts is a 61 y.o. male who presents to clinic today for yearly  follow-up visit.  Has chronic reflux for which he takes pantoprazole daily.  States his symptoms are well controlled as long as he takes his medication.  No dysphagia or odynophagia.  No melena hematochezia.  No epigastric or chest pain. ? ?Last EGD 2017 showed a stricture as well as peptic duodenitis.  Biopsies negative for H. pylori.  Colonoscopy at the same time showed mild diverticulosis, no polyps.  Patient denies any family history of colorectal malignancy. ? ?Past Medical History:  ?Diagnosis Date  ? Arthritis 03/2011  ? Gout- Right foot  ? CVA (cerebral infarction)   ? GERD (gastroesophageal reflux disease)   ? Gout   ? Headache   ? Migraine Headaches  ? History of kidney stones   ? Raynaud's syndrome   ? Stroke [facility]) 2011  ? ? ?Past Surgical History:  ?Procedure Laterality Date  ? COLONOSCOPY N/A 03/22/2015  ? Procedure: COLONOSCOPY;  Surgeon: West Bali, MD;  [facility];  Service: Endoscopy;  Laterality: N/A;  1215  ? ESOPHAGOGASTRODUODENOSCOPY N/A 12/22/2014  ? Dr. Darrick Penna: 1. dysphagia due to mid-esophageal web and uncontrolled reflux esophagitis. 2. small hiatal hernia 3. multiple small gastric and duodenal ulcers and mild duodenitis . Path with reactive gastropathy, negative H. pylori   ? ESOPHAGOGASTRODUODENOSCOPY N/A 03/22/2015  ? Procedure: ESOPHAGOGASTRODUODENOSCOPY (EGD);  Surgeon: West Bali, MD;  [facility];  Service: Endoscopy;  Laterality: N/A;  ? HERNIA REPAIR Left 2007  ? INCISION AND DRAINAGE Right 12/18/2014  ? Procedure: INCISION AND DRAINAGE THUMB;  Surgeon: Vickki Hearing, MD;  [facility]: [facility];  Service: Orthopedics;  Laterality: Right;  ? INCISION AND  DRAINAGE Right 12/21/2014  ? Procedure: INCISION AND DRAINAGE RIGHT THUMB;  Surgeon: Vickki Hearing, MD;  [facility]: [facility];  Service: Orthopedics;  Laterality: Right;  ? incision and drainage of thumb  12/18/14  ? ORIF FEMUR FRACTURE Right 10/28/2020  ? Procedure: OPEN REDUCTION INTERNAL FIXATION (ORIF) DISTAL FEMUR FRACTURE;  Surgeon: Samson Frederic, MD;  Location: [facility];  Service: Orthopedics;  Laterality: Right;  ? TOTAL HIP ARTHROPLASTY Right 09/18/2016  ? TOTAL HIP ARTHROPLASTY Right 09/18/2016  ? Procedure: TOTAL HIP ARTHROPLASTY ANTERIOR APPROACH;  Surgeon: Samson Frederic, MD;  [facility]: [facility];  Service: Orthopedics;  Laterality: Right;  ? TOTAL HIP ARTHROPLASTY Left 02/26/2017  ? Procedure: LEFT TOTAL HIP ARTHROPLASTY; ANTERIOR APPROACH;  Surgeon: Samson Frederic, MD;  [facility]: [facility];  Service: Orthopedics;  Laterality: Left;  ? ? ?Current Outpatient Medications  ?Medication Sig Dispense Refill  ? albuterol (VENTOLIN HFA) 108 (90 Base) MCG/ACT inhaler Inhale 1-2 puffs into the lungs every 6 (six) hours as needed for wheezing or shortness of breath. 18 g 0  ? aspirin 81 MG chewable tablet Chew 1 tablet (81 mg total) by mouth 2 (two) times daily. 60 tablet 1  ? pantoprazole (PROTONIX) 40 MG tablet TAKE (1) TABLET BY MOUTH ONCE DAILY. 30 tablet 0  ? allopurinol (ZYLOPRIM) 100 MG tablet Take 100 mg by mouth daily.    ? benzonatate (TESSALON) 100 MG capsule Take 1-2 capsules (100-200 mg total) by mouth 3 (three) times daily as needed for cough.  60 capsule 0  ? ondansetron (ZOFRAN ODT) 4 MG disintegrating tablet Take 1 tablet (4 mg total) by mouth every 8 (eight) hours as needed for nausea or vomiting. 10 tablet 0  ? oxyCODONE-acetaminophen (PERCOCET/ROXICET) 5-325 MG tablet Take 1 tablet by mouth every 4 (four) hours as needed for severe pain. 30 tablet 0  ? predniSONE (DELTASONE) 50 MG tablet Take 1 tablet (50 mg total) by mouth daily with breakfast. 5 tablet 0  ? promethazine-dextromethorphan  (PROMETHAZINE-DM) 6.25-15 MG/5ML syrup Take 5 mLs by mouth at bedtime as needed for cough. 100 mL 0  ? sodium chloride (OCEAN) 0.65 % SOLN nasal spray Place 1 spray into both nostrils as needed for congestion (dryness).    ? tamsulosin (FLOMAX) 0.4 MG CAPS capsule Take 1 capsule (0.4 mg total) by mouth daily. 14 capsule 0  ? ?No current facility-administered medications for this visit.  ? ? ?Allergies as of 06/09/2021 - Review Complete 06/09/2021  ?Allergen Reaction Noted  ? Codeine Nausea And Vomiting 05/08/2011  ? Hydrocodone Nausea Only 10/28/2011  ? ? ?Family History  ?Problem Relation Age of Onset  ? Cancer Mother   ? Hyperlipidemia Mother   ? Hypertension Mother   ? Heart attack Mother   ? Rheumatologic disease Mother   ? Cancer Father   ? Heart disease Father [id number]  ?     Heart Disease before age 63  ? Heart attack Father   ? Diabetes Sister   ? Cancer Sister   ? Hyperlipidemia Sister   ? Hypertension Sister   ? Colon cancer Neg Hx   ? ? ?Social History  ? ?Socioeconomic History  ? Marital status: Married  ?  Spouse name: Not on file  ? Number of children: Not on file  ? Years of education: Not on file  ? Highest education level: Not on file  ?Occupational History  ? Not on file  ?Tobacco Use  ? Smoking status: Former  ?  Packs/day: 3.00  ?  Years: 30.00  ?  Pack years: 90.00  ?  Types: Cigarettes  ?  Quit date: 01/30/2005  ?  Years since quitting: 16.3  ? Smokeless tobacco: Never  ?Vaping Use  ? Vaping Use: Never used  ?Substance and Sexual Activity  ? Alcohol use: Not Currently  ? Drug use: No  ? Sexual activity: Not Currently  ?Other Topics Concern  ? Not on file  ?Social History Narrative  ? Not on file  ? ?Social Determinants of Health  ? ?Financial Resource Strain: Not on file  ?Food Insecurity: Not on file  ?Transportation Needs: Not on file  ?Physical Activity: Not on file  ?Stress: Not on file  ?Social Connections: Not on file  ? ? ?Subjective: ?Review of Systems  ?Constitutional:  Negative for chills and  fever.  ?HENT:  Negative for congestion and hearing loss.   ?Eyes:  Negative for blurred vision and double vision.  ?Respiratory:  Negative for cough and shortness of breath.   ?Cardiovascular:  Negative for chest pain and palpitations.  ?Gastrointestinal:  Negative for abdominal pain, blood in stool, constipation, diarrhea, heartburn, melena and vomiting.  ?Genitourinary:  Negative for dysuria and urgency.  ?Musculoskeletal:  Negative for joint pain and myalgias.  ?Skin:  Negative for itching and rash.  ?Neurological:  Negative for dizziness and headaches.  ?Psychiatric/Behavioral:  Negative for depression. The patient is not nervous/anxious.   ? ? ?Objective: ?BP 133/75   Pulse 74   Temp (!) 97.5 ?F (36.4 ?  C)   Ht 5\' 8"  (1.727 m)   Wt 199 lb 6.4 oz (90.4 kg)   BMI 30.32 kg/m?  ?Physical Exam ?Constitutional:   ?   Appearance: Normal appearance.  ?HENT:  ?   Head: Normocephalic and atraumatic.  ?Eyes:  ?   Extraocular Movements: Extraocular movements intact.  ?   Conjunctiva/sclera: Conjunctivae normal.  ?Cardiovascular:  ?   Rate and Rhythm: Normal rate and regular rhythm.  ?Pulmonary:  ?   Effort: Pulmonary effort is normal.  ?   Breath sounds: Normal breath sounds.  ?Abdominal:  ?   General: Bowel sounds are normal.  ?   Palpations: Abdomen is soft.  ?Musculoskeletal:     ?   General: Normal range of motion.  ?   Cervical back: Normal range of motion and neck supple.  ?Skin: ?   General: Skin is warm.  ?Neurological:  ?   General: No focal deficit present.  ?   Mental Status: He is alert and oriented to person, place, and time.  ?Psychiatric:     ?   Mood and Affect: Mood normal.     ?   Behavior: Behavior normal.  ? ? ? ?Assessment: ?*GERD-well-controlled on pantoprazole daily ?*Colon cancer screening ? ?Plan: ?Patient's GERD well-controlled on pantoprazole daily.  We will send in year supply of refills today.  I have also printed off home practices to decrease reflux symptoms. ? ?Colonoscopy recall  2027. ? ?Patient follow-up in 1 year or sooner if needed ? ?06/09/2021 9:09 AM ? ? ?Disclaimer: This note was dictated with voice recognition software. Similar sounding words can inadvertently be transcribed and may not be correct

## 2021-06-09 NOTE — Patient Instructions (Addendum)
Continue on pantoprazole daily for your chronic reflux.  I have refilled this and sent a year supply. ? ?We will plan on colonoscopy 2027 for colon cancer screening purposes. ? ?Follow-up in 1 year or sooner if needed. ? ?It was great seeing you again today. ? ?Dr. Marletta Lor ? ?Lifestyle and home remedies TO MANAGE REFLUX/HEARTBURN ? ?  ?You may eliminate or reduce the frequency of heartburn by making the following lifestyle changes: ?  ?Control your weight. Being overweight is a major risk factor for heartburn and GERD. Excess pounds put pressure on your abdomen, pushing up your stomach and causing acid to back up into your esophagus.  ?  ?Eat smaller meals. 4 TO 6 MEALS A DAY. This reduces pressure on the lower esophageal sphincter, helping to prevent the valve from opening and acid from washing back into your esophagus. ?  ?  ?Loosen your belt. Clothes that fit tightly around your waist put pressure on your abdomen and the lower esophageal sphincter. ?  ?  ?Eliminate heartburn triggers. Everyone has specific triggers. Common triggers such as fatty or fried foods, spicy food, tomato sauce, carbonated beverages, alcohol, chocolate, mint, garlic, onion, caffeine and nicotine may make heartburn worse.  ?  ?Avoid stooping or bending. Tying your shoes is OK. Bending over for longer periods to weed your garden isn't, especially soon after eating.  ?  ?Don't lie down after a meal. Wait at least three to four hours after eating before going to bed, and don't lie down right after eating.  ? ?

## 2021-08-02 ENCOUNTER — Inpatient Hospital Stay (HOSPITAL_COMMUNITY)
Admission: EM | Admit: 2021-08-02 | Discharge: 2021-08-04 | DRG: 062 | Disposition: A | Discharge status: Home / Self Care | Payer: BC Managed Care – PPO | Attending: Neurology | Admitting: Neurology

## 2021-08-02 DIAGNOSIS — Z885 Allergy status to narcotic agent status: Secondary | ICD-10-CM | POA: Diagnosis not present

## 2021-08-02 DIAGNOSIS — M109 Gout, unspecified: Secondary | ICD-10-CM | POA: Diagnosis present

## 2021-08-02 DIAGNOSIS — E785 Hyperlipidemia, unspecified: Secondary | ICD-10-CM | POA: Diagnosis present

## 2021-08-02 DIAGNOSIS — E669 Obesity, unspecified: Secondary | ICD-10-CM | POA: Diagnosis not present

## 2021-08-02 DIAGNOSIS — R569 Unspecified convulsions: Secondary | ICD-10-CM | POA: Diagnosis not present

## 2021-08-02 DIAGNOSIS — I6389 Other cerebral infarction: Secondary | ICD-10-CM | POA: Diagnosis not present

## 2021-08-02 DIAGNOSIS — R29702 NIHSS score 2: Secondary | ICD-10-CM | POA: Diagnosis not present

## 2021-08-02 DIAGNOSIS — I639 Cerebral infarction, unspecified: Secondary | ICD-10-CM | POA: Diagnosis present

## 2021-08-02 DIAGNOSIS — R29818 Other symptoms and signs involving the nervous system: Secondary | ICD-10-CM | POA: Diagnosis not present

## 2021-08-02 DIAGNOSIS — K219 Gastro-esophageal reflux disease without esophagitis: Secondary | ICD-10-CM | POA: Diagnosis present

## 2021-08-02 DIAGNOSIS — R471 Dysarthria and anarthria: Secondary | ICD-10-CM | POA: Diagnosis present

## 2021-08-02 DIAGNOSIS — Z6830 Body mass index (BMI) 30.0-30.9, adult: Secondary | ICD-10-CM

## 2021-08-02 DIAGNOSIS — Z8249 Family history of ischemic heart disease and other diseases of the circulatory system: Secondary | ICD-10-CM

## 2021-08-02 DIAGNOSIS — Z7982 Long term (current) use of aspirin: Secondary | ICD-10-CM

## 2021-08-02 DIAGNOSIS — H532 Diplopia: Secondary | ICD-10-CM | POA: Diagnosis not present

## 2021-08-02 DIAGNOSIS — M1990 Unspecified osteoarthritis, unspecified site: Secondary | ICD-10-CM | POA: Diagnosis present

## 2021-08-02 DIAGNOSIS — G8194 Hemiplegia, unspecified affecting left nondominant side: Secondary | ICD-10-CM | POA: Diagnosis not present

## 2021-08-02 DIAGNOSIS — Z8673 Personal history of transient ischemic attack (TIA), and cerebral infarction without residual deficits: Secondary | ICD-10-CM

## 2021-08-02 DIAGNOSIS — E876 Hypokalemia: Secondary | ICD-10-CM | POA: Diagnosis not present

## 2021-08-02 DIAGNOSIS — Z20822 Contact with and (suspected) exposure to covid-19: Secondary | ICD-10-CM | POA: Diagnosis not present

## 2021-08-02 DIAGNOSIS — Z87891 Personal history of nicotine dependence: Secondary | ICD-10-CM

## 2021-08-02 DIAGNOSIS — R531 Weakness: Secondary | ICD-10-CM | POA: Diagnosis not present

## 2021-08-02 DIAGNOSIS — Z79899 Other long term (current) drug therapy: Secondary | ICD-10-CM | POA: Diagnosis not present

## 2021-08-02 DIAGNOSIS — G459 Transient cerebral ischemic attack, unspecified: Secondary | ICD-10-CM | POA: Diagnosis not present

## 2021-08-02 DIAGNOSIS — J324 Chronic pansinusitis: Secondary | ICD-10-CM | POA: Diagnosis not present

## 2021-08-02 DIAGNOSIS — N179 Acute kidney failure, unspecified: Secondary | ICD-10-CM | POA: Diagnosis present

## 2021-08-02 DIAGNOSIS — R29810 Facial weakness: Secondary | ICD-10-CM | POA: Diagnosis not present

## 2021-08-02 DIAGNOSIS — I6523 Occlusion and stenosis of bilateral carotid arteries: Secondary | ICD-10-CM | POA: Diagnosis not present

## 2021-08-02 DIAGNOSIS — R231 Pallor: Secondary | ICD-10-CM | POA: Diagnosis not present

## 2021-08-02 LAB — I-STAT CHEM 8, ED
BUN: 47 mg/dL — ABNORMAL HIGH (ref 8–23)
Calcium, Ion: 1.03 mmol/L — ABNORMAL LOW (ref 1.15–1.40)
Chloride: 104 mmol/L (ref 98–111)
Creatinine, Ser: 1.7 mg/dL — ABNORMAL HIGH (ref 0.61–1.24)
Glucose, Bld: 85 mg/dL (ref 70–99)
HCT: 45 % (ref 39.0–52.0)
Hemoglobin: 15.3 g/dL (ref 13.0–17.0)
Potassium: 3.6 mmol/L (ref 3.5–5.1)
Sodium: 140 mmol/L (ref 135–145)
TCO2: 23 mmol/L (ref 22–32)

## 2021-08-02 NOTE — ED Provider Notes (Signed)
[facility] EMERGENCY DEPARTMENT  Provider Note  CSN: [id number] Arrival date & time: 08/02/21 2350  History No chief complaint on file.   Cory Roberts is a 61 y.o. male with history of remote stroke about 15 years ago with no residual symptoms brought to the ED via EMS as a Code Stroke from home. He was working in the yard during the day today, up until about 8pm with a friend. He reports around 8:30pm he began having muscle cramps in his legs and diaphoresis. He then noticed his L arm and leg were weak and his speech was slurred. He texted his daughter around 9pm and EMS was called around 11:30pm. Per EMS, his speech was garbled and his L arm was flaccid on their arrival but has improved during transport. He continues to have muscle cramps in his legs. Otherwise he has been in his usual state of health. No EtOH or drug use.    Home Medications Prior to Admission medications   Medication Sig Start Date End Date Taking? Authorizing Provider  albuterol (VENTOLIN HFA) 108 (90 Base) MCG/ACT inhaler Inhale 1-2 puffs into the lungs every 6 (six) hours as needed for wheezing or shortness of breath. 04/03/21   Wallis Bamberg, PA-C  aspirin 81 MG chewable tablet Chew 1 tablet (81 mg total) by mouth 2 (two) times daily. 10/29/20   Swinteck, Arlys John, MD  pantoprazole (PROTONIX) 40 MG tablet Take 1 tablet (40 mg total) by mouth daily. 06/09/21 06/09/22  Lanelle Bal, DO     Allergies    Codeine and Hydrocodone   Review of Systems   Review of Systems Please see HPI for pertinent positives and negatives  Physical Exam There were no vitals taken for this visit.  Physical Exam Vitals and nursing note reviewed.  Constitutional:      Appearance: Normal appearance.  HENT:     Head: Normocephalic and atraumatic.     Nose: Nose normal.     Mouth/Throat:     Mouth: Mucous membranes are moist.  Eyes:     Extraocular Movements: Extraocular movements intact.     Conjunctiva/sclera: Conjunctivae  normal.  Cardiovascular:     Rate and Rhythm: Normal rate.  Pulmonary:     Effort: Pulmonary effort is normal.     Breath sounds: Normal breath sounds.  Abdominal:     General: Abdomen is flat.     Palpations: Abdomen is soft.     Tenderness: There is no abdominal tenderness.  Musculoskeletal:        General: No swelling. Normal range of motion.     Cervical back: Neck supple.  Skin:    General: Skin is warm and dry.  Neurological:     Mental Status: He is alert and oriented to person, place, and time.     Cranial Nerves: No cranial nerve deficit.     Motor: Weakness (L arm and leg drift) present.  Psychiatric:        Mood and Affect: Mood normal.     ED Results / Procedures / Treatments   EKG None  Procedures Procedures  Medications Ordered in the ED Medications - No data to display  Initial Impression and Plan  Patient arrives as a code stroke, activated on arrival. Taken directly to CT. Code Stroke order set initiated. Added CK and Mg levels given his muscle cramping.   ED Course       MDM Rules/Calculators/A&P Medical Decision Making Amount and/or Complexity of Data Reviewed Labs:  ordered. Radiology: ordered.    Final Clinical Impression(s) / ED Diagnoses Final diagnoses:  None    Rx / DC Orders ED Discharge Orders     None

## 2021-08-03 ENCOUNTER — Inpatient Hospital Stay (HOSPITAL_COMMUNITY): Payer: BC Managed Care – PPO

## 2021-08-03 ENCOUNTER — Emergency Department (HOSPITAL_COMMUNITY): Payer: BC Managed Care – PPO

## 2021-08-03 ENCOUNTER — Encounter (HOSPITAL_COMMUNITY): Payer: Self-pay | Admitting: Neurology

## 2021-08-03 ENCOUNTER — Other Ambulatory Visit: Payer: Self-pay

## 2021-08-03 DIAGNOSIS — Z79899 Other long term (current) drug therapy: Secondary | ICD-10-CM | POA: Diagnosis not present

## 2021-08-03 DIAGNOSIS — Z87891 Personal history of nicotine dependence: Secondary | ICD-10-CM | POA: Diagnosis not present

## 2021-08-03 DIAGNOSIS — Z20822 Contact with and (suspected) exposure to covid-19: Secondary | ICD-10-CM | POA: Diagnosis not present

## 2021-08-03 DIAGNOSIS — G459 Transient cerebral ischemic attack, unspecified: Secondary | ICD-10-CM | POA: Diagnosis not present

## 2021-08-03 DIAGNOSIS — E876 Hypokalemia: Secondary | ICD-10-CM | POA: Diagnosis not present

## 2021-08-03 DIAGNOSIS — E669 Obesity, unspecified: Secondary | ICD-10-CM | POA: Diagnosis not present

## 2021-08-03 DIAGNOSIS — N179 Acute kidney failure, unspecified: Secondary | ICD-10-CM | POA: Diagnosis not present

## 2021-08-03 DIAGNOSIS — R471 Dysarthria and anarthria: Secondary | ICD-10-CM | POA: Diagnosis not present

## 2021-08-03 DIAGNOSIS — G8194 Hemiplegia, unspecified affecting left nondominant side: Secondary | ICD-10-CM | POA: Diagnosis not present

## 2021-08-03 DIAGNOSIS — K219 Gastro-esophageal reflux disease without esophagitis: Secondary | ICD-10-CM | POA: Diagnosis not present

## 2021-08-03 DIAGNOSIS — I6389 Other cerebral infarction: Secondary | ICD-10-CM | POA: Diagnosis not present

## 2021-08-03 DIAGNOSIS — R569 Unspecified convulsions: Secondary | ICD-10-CM | POA: Diagnosis not present

## 2021-08-03 DIAGNOSIS — M1990 Unspecified osteoarthritis, unspecified site: Secondary | ICD-10-CM | POA: Diagnosis not present

## 2021-08-03 DIAGNOSIS — I639 Cerebral infarction, unspecified: Secondary | ICD-10-CM | POA: Diagnosis not present

## 2021-08-03 DIAGNOSIS — M109 Gout, unspecified: Secondary | ICD-10-CM | POA: Diagnosis not present

## 2021-08-03 DIAGNOSIS — H532 Diplopia: Secondary | ICD-10-CM | POA: Diagnosis not present

## 2021-08-03 DIAGNOSIS — E785 Hyperlipidemia, unspecified: Secondary | ICD-10-CM | POA: Diagnosis not present

## 2021-08-03 DIAGNOSIS — Z885 Allergy status to narcotic agent status: Secondary | ICD-10-CM | POA: Diagnosis not present

## 2021-08-03 DIAGNOSIS — Z8249 Family history of ischemic heart disease and other diseases of the circulatory system: Secondary | ICD-10-CM | POA: Diagnosis not present

## 2021-08-03 DIAGNOSIS — R29702 NIHSS score 2: Secondary | ICD-10-CM | POA: Diagnosis not present

## 2021-08-03 DIAGNOSIS — Z8673 Personal history of transient ischemic attack (TIA), and cerebral infarction without residual deficits: Secondary | ICD-10-CM | POA: Diagnosis not present

## 2021-08-03 DIAGNOSIS — Z6830 Body mass index (BMI) 30.0-30.9, adult: Secondary | ICD-10-CM | POA: Diagnosis not present

## 2021-08-03 DIAGNOSIS — Z7982 Long term (current) use of aspirin: Secondary | ICD-10-CM | POA: Diagnosis not present

## 2021-08-03 LAB — RESP PANEL BY RT-PCR (FLU A&B, COVID) ARPGX2
Influenza A by PCR: NEGATIVE
Influenza B by PCR: NEGATIVE
SARS Coronavirus 2 by RT PCR: NEGATIVE

## 2021-08-03 LAB — COMPREHENSIVE METABOLIC PANEL
ALT: 18 U/L (ref 0–44)
AST: 25 U/L (ref 15–41)
Albumin: 4.3 g/dL (ref 3.5–5.0)
Alkaline Phosphatase: 124 U/L (ref 38–126)
Anion gap: 9 (ref 5–15)
BUN: 37 mg/dL — ABNORMAL HIGH (ref 8–23)
CO2: 23 mmol/L (ref 22–32)
Calcium: 8.7 mg/dL — ABNORMAL LOW (ref 8.9–10.3)
Chloride: 105 mmol/L (ref 98–111)
Creatinine, Ser: 1.6 mg/dL — ABNORMAL HIGH (ref 0.61–1.24)
GFR, Estimated: 48 mL/min — ABNORMAL LOW (ref >60)
Glucose, Bld: 90 mg/dL (ref 70–99)
Potassium: 3.4 mmol/L — ABNORMAL LOW (ref 3.5–5.1)
Sodium: 137 mmol/L (ref 135–145)
Total Bilirubin: 1 mg/dL (ref 0.3–1.2)
Total Protein: 7.4 g/dL (ref 6.5–8.1)

## 2021-08-03 LAB — CBC
HCT: 43 % (ref 39.0–52.0)
HCT: 44.8 % (ref 39.0–52.0)
Hemoglobin: 14.6 g/dL (ref 13.0–17.0)
Hemoglobin: 15.4 g/dL (ref 13.0–17.0)
MCH: 31.9 pg (ref 26.0–34.0)
MCH: 32.6 pg (ref 26.0–34.0)
MCHC: 34 g/dL (ref 30.0–36.0)
MCHC: 34.4 g/dL (ref 30.0–36.0)
MCV: 94.1 fL (ref 80.0–100.0)
MCV: 94.7 fL (ref 80.0–100.0)
Platelets: 244 10*3/uL (ref 150–400)
Platelets: 248 10*3/uL (ref 150–400)
RBC: 4.57 MIL/uL (ref 4.22–5.81)
RBC: 4.73 MIL/uL (ref 4.22–5.81)
RDW: 13.2 % (ref 11.5–15.5)
RDW: 13.2 % (ref 11.5–15.5)
WBC: 10.5 10*3/uL (ref 4.0–10.5)
WBC: 12.1 10*3/uL — ABNORMAL HIGH (ref 4.0–10.5)
nRBC: 0 % (ref 0.0–0.2)
nRBC: 0 % (ref 0.0–0.2)

## 2021-08-03 LAB — LIPID PANEL
Cholesterol: 165 mg/dL (ref 0–200)
HDL: 29 mg/dL — ABNORMAL LOW (ref >40)
LDL Cholesterol: 128 mg/dL — ABNORMAL HIGH (ref 0–99)
Total CHOL/HDL Ratio: 5.7 RATIO
Triglycerides: 42 mg/dL (ref <150)
VLDL: 8 mg/dL (ref 0–40)

## 2021-08-03 LAB — HEMOGLOBIN A1C
Hgb A1c MFr Bld: 5.6 % (ref 4.8–5.6)
Mean Plasma Glucose: 114.02 mg/dL

## 2021-08-03 LAB — URINALYSIS, ROUTINE W REFLEX MICROSCOPIC
Bilirubin Urine: NEGATIVE
Glucose, UA: NEGATIVE mg/dL
Hgb urine dipstick: NEGATIVE
Ketones, ur: 5 mg/dL — AB
Leukocytes,Ua: NEGATIVE
Nitrite: NEGATIVE
Protein, ur: NEGATIVE mg/dL
Specific Gravity, Urine: >1.046 — ABNORMAL HIGH (ref 1.005–1.030)
pH: 5 (ref 5.0–8.0)

## 2021-08-03 LAB — RAPID URINE DRUG SCREEN, HOSP PERFORMED
Amphetamines: NOT DETECTED
Barbiturates: NOT DETECTED
Benzodiazepines: NOT DETECTED
Cocaine: NOT DETECTED
Opiates: NOT DETECTED
Tetrahydrocannabinol: NOT DETECTED

## 2021-08-03 LAB — ECHOCARDIOGRAM COMPLETE
Area-P 1/2: 3.03 cm2
Height: 66 in
S' Lateral: 3.1 cm
Weight: 3015.89 oz

## 2021-08-03 LAB — BASIC METABOLIC PANEL
Anion gap: 15 (ref 5–15)
BUN: 31 mg/dL — ABNORMAL HIGH (ref 8–23)
CO2: 22 mmol/L (ref 22–32)
Calcium: 9.3 mg/dL (ref 8.9–10.3)
Chloride: 102 mmol/L (ref 98–111)
Creatinine, Ser: 1.36 mg/dL — ABNORMAL HIGH (ref 0.61–1.24)
GFR, Estimated: 58 mL/min — ABNORMAL LOW (ref >60)
Glucose, Bld: 100 mg/dL — ABNORMAL HIGH (ref 70–99)
Potassium: 2.9 mmol/L — ABNORMAL LOW (ref 3.5–5.1)
Sodium: 139 mmol/L (ref 135–145)

## 2021-08-03 LAB — DIFFERENTIAL
Abs Immature Granulocytes: 0.04 10*3/uL (ref 0.00–0.07)
Basophils Absolute: 0.1 10*3/uL (ref 0.0–0.1)
Basophils Relative: 0 %
Eosinophils Absolute: 0.1 10*3/uL (ref 0.0–0.5)
Eosinophils Relative: 0 %
Immature Granulocytes: 0 %
Lymphocytes Relative: 14 %
Lymphs Abs: 1.7 10*3/uL (ref 0.7–4.0)
Monocytes Absolute: 1.2 10*3/uL — ABNORMAL HIGH (ref 0.1–1.0)
Monocytes Relative: 10 %
Neutro Abs: 9.1 10*3/uL — ABNORMAL HIGH (ref 1.7–7.7)
Neutrophils Relative %: 76 %

## 2021-08-03 LAB — PROTIME-INR
INR: 1.1 (ref 0.8–1.2)
Prothrombin Time: 13.7 seconds (ref 11.4–15.2)

## 2021-08-03 LAB — CBG MONITORING, ED: Glucose-Capillary: 89 mg/dL (ref 70–99)

## 2021-08-03 LAB — APTT: aPTT: 25 seconds (ref 24–36)

## 2021-08-03 LAB — CK: Total CK: 194 U/L (ref 49–397)

## 2021-08-03 LAB — MRSA NEXT GEN BY PCR, NASAL: MRSA by PCR Next Gen: NOT DETECTED

## 2021-08-03 LAB — ETHANOL: Alcohol, Ethyl (B): <10 mg/dL (ref <10)

## 2021-08-03 LAB — HIV ANTIBODY (ROUTINE TESTING W REFLEX): HIV Screen 4th Generation wRfx: NONREACTIVE

## 2021-08-03 LAB — MAGNESIUM: Magnesium: 2.2 mg/dL (ref 1.7–2.4)

## 2021-08-03 MED ORDER — LORAZEPAM 2 MG/ML IJ SOLN
INTRAMUSCULAR | Status: AC
  Administered 2021-08-03: 1 mg via INTRAVENOUS
  Filled 2021-08-03: qty 1

## 2021-08-03 MED ORDER — PANTOPRAZOLE SODIUM 40 MG IV SOLR
40.0000 mg | Freq: Every day | INTRAVENOUS | Status: DC
  Administered 2021-08-03: 40 mg via INTRAVENOUS
  Filled 2021-08-03: qty 10

## 2021-08-03 MED ORDER — TENECTEPLASE FOR STROKE
PACK | INTRAVENOUS | Status: AC
  Filled 2021-08-03: qty 10

## 2021-08-03 MED ORDER — SODIUM CHLORIDE 0.9 % IV SOLN: INTRAVENOUS | Status: DC

## 2021-08-03 MED ORDER — NICARDIPINE HCL IN NACL 20-0.86 MG/200ML-% IV SOLN: 0.0000 mg/h | INTRAVENOUS | Status: DC | PRN

## 2021-08-03 MED ORDER — LORAZEPAM 2 MG/ML IJ SOLN: 1.0000 mg | Freq: Once | INTRAMUSCULAR | Status: AC

## 2021-08-03 MED ORDER — ACETAMINOPHEN 160 MG/5ML PO SOLN: 650.0000 mg | ORAL | Status: DC | PRN

## 2021-08-03 MED ORDER — TOPIRAMATE 25 MG PO TABS
50.0000 mg | ORAL_TABLET | Freq: Every day | ORAL | Status: DC
Start: 2021-08-03 — End: 2021-08-04
  Administered 2021-08-03 – 2021-08-04 (×2): 50 mg via ORAL
  Filled 2021-08-03 (×2): qty 2

## 2021-08-03 MED ORDER — IOHEXOL 350 MG/ML SOLN
100.0000 mL | Freq: Once | INTRAVENOUS | Status: AC | PRN
  Administered 2021-08-03: 100 mL via INTRAVENOUS

## 2021-08-03 MED ORDER — LABETALOL HCL 5 MG/ML IV SOLN: 10.0000 mg | Freq: Once | INTRAVENOUS | Status: DC | PRN

## 2021-08-03 MED ORDER — SODIUM CHLORIDE 0.9 % IV BOLUS
1000.0000 mL | Freq: Once | INTRAVENOUS | Status: AC
  Administered 2021-08-03: 1000 mL via INTRAVENOUS

## 2021-08-03 MED ORDER — ACETAMINOPHEN 650 MG RE SUPP: 650.0000 mg | RECTAL | Status: DC | PRN

## 2021-08-03 MED ORDER — POTASSIUM CHLORIDE CRYS ER 20 MEQ PO TBCR
40.0000 meq | EXTENDED_RELEASE_TABLET | ORAL | Status: AC
  Administered 2021-08-03 (×2): 40 meq via ORAL
  Filled 2021-08-03 (×2): qty 2

## 2021-08-03 MED ORDER — ACETAMINOPHEN 325 MG PO TABS
650.0000 mg | ORAL_TABLET | ORAL | Status: DC | PRN
  Administered 2021-08-03 – 2021-08-04 (×2): 650 mg via ORAL
  Filled 2021-08-03 (×2): qty 2

## 2021-08-03 MED ORDER — STROKE: EARLY STAGES OF RECOVERY BOOK
Freq: Once | Status: AC
  Filled 2021-08-03: qty 1

## 2021-08-03 MED ORDER — PANTOPRAZOLE SODIUM 40 MG PO TBEC: 40.0000 mg | DELAYED_RELEASE_TABLET | Freq: Every day | ORAL | Status: DC

## 2021-08-03 MED ORDER — ATORVASTATIN CALCIUM 40 MG PO TABS
40.0000 mg | ORAL_TABLET | Freq: Every day | ORAL | Status: DC
  Administered 2021-08-03 – 2021-08-04 (×2): 40 mg via ORAL
  Filled 2021-08-03 (×2): qty 1

## 2021-08-03 MED ORDER — LEVETIRACETAM IN NACL 1500 MG/100ML IV SOLN
1500.0000 mg | Freq: Once | INTRAVENOUS | Status: AC
  Administered 2021-08-03: 1500 mg via INTRAVENOUS
  Filled 2021-08-03: qty 100

## 2021-08-03 MED ORDER — TENECTEPLASE FOR STROKE
0.2500 mg/kg | PACK | Freq: Once | INTRAVENOUS | Status: AC
  Administered 2021-08-03: 22 mg via INTRAVENOUS
  Filled 2021-08-03: qty 10

## 2021-08-03 MED ORDER — CHLORHEXIDINE GLUCONATE CLOTH 2 % EX PADS
6.0000 | MEDICATED_PAD | Freq: Every day | CUTANEOUS | Status: DC
  Administered 2021-08-03: 6 via TOPICAL

## 2021-08-03 NOTE — Evaluation (Signed)
Physical Therapy Evaluation Patient Details Name: Cory Roberts MRN: [id number] DOB: [birth year] Today's Date: 08/03/2021  History of Present Illness  Pt is a 61 y.o. male who presented 08/02/21 with L arm and leg weakness and slurred speech. Pt also with muscle cramps in legs and diaphoresis. tNK was administered. EEG negative for seizures. CT/CTA of head was negative. Awaiting brain MRI. PMH: CVA, GERD, gout, Raynaud's syndrome   Clinical Impression  Pt presents with condition above and deficits mentioned below, see PT Problem List. 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 Pt lives with his wife in a mobile home with 2 STE. Currently, pt's family reports his memory and processing speed have been impaired compared to his baseline. Pt also noted to have deficits in balance, L arm and leg strength, L leg proprioception, L leg functional coordination, along with L medial ankle/foot sensation. Pt is requiring up to minA for transfers and to ambulate in the hall with a RW, intermittently scissoring his L leg when taking a step. He also reports continued double vision. He is at risk for falls. As his family is very involved and able to assist him as needed and I anticipate pt will progress well, recommending follow-up with OPPT in a neuro clinic. Will continue to follow acutely.     Recommendations for follow up therapy are one component of a multi-disciplinary discharge planning process, led by the attending physician.  Recommendations may be updated based on patient status, additional functional criteria and insurance authorization.  Follow Up Recommendations Outpatient PT (neuro clinic)      Assistance Recommended at Discharge Frequent or constant Supervision/Assistance  Patient can return home with the following  A little help with walking and/or transfers;A little help with bathing/dressing/bathroom;Assistance with cooking/housework;Direct supervision/assist for  medications management;Direct supervision/assist for financial management;Assist for transportation;Help with stairs or ramp for entrance    Equipment Recommendations None recommended by PT  Recommendations for Other Services       Functional Status Assessment Patient has had a recent decline in their functional status and demonstrates the ability to make significant improvements in function in a reasonable and predictable amount of time.     Precautions / Restrictions Precautions Precautions: Fall Restrictions Weight Bearing Restrictions: No      Mobility  Bed Mobility Overal bed mobility: Needs Assistance Bed Mobility: Supine to Sit, Sit to Supine     Supine to sit: Min guard, HOB elevated Sit to supine: Min guard, HOB elevated   General bed mobility comments: Min guard assist for safety, HOB elevated.    Transfers Overall transfer level: Needs assistance Equipment used: None Transfers: Sit to/from Stand Sit to Stand: Min assist           General transfer comment: Pt with trunk instability, needing minA to prevent LOB when coming to stand from EOB.    Ambulation/Gait Ambulation/Gait assistance: Min assist Gait Distance (Feet): 90 Feet Assistive device: Rolling walker (2 wheels) Gait Pattern/deviations: Step-through pattern, Decreased step length - left, Decreased stride length, Scissoring, Trunk flexed Gait velocity: reduced Gait velocity interpretation: <1.31 ft/sec, indicative of household ambulator   General Gait Details: Pt with slow, unsteady gait, reaching for bed rails initially when trying to take a step at EOB. Thus, obtained RW for improved stability. Pt wih occasional scissoring of L leg when ambulating, needing cues to widen his stance. MinA for stability.  Stairs  Wheelchair Mobility    Modified Rankin (Stroke Patients Only) Modified Rankin (Stroke Patients Only) Pre-Morbid Rankin Score: No symptoms Modified Rankin: Moderately  severe disability     Balance Overall balance assessment: Needs assistance Sitting-balance support: No upper extremity supported, Feet supported Sitting balance-[name]: Good     Standing balance support: Bilateral upper extremity supported, Single extremity supported, No upper extremity supported, During functional activity Standing balance-[name]: Poor Standing balance comment: Reliant on up to minA for static standing without UE support. Reliant on RW and up to minA to ambulate.                             Pertinent Vitals/Pain Pain Assessment Pain Assessment: No/denies pain    Home Living Family/patient expects to be discharged to:: Private residence Living Arrangements: Spouse/significant other Available Help at Discharge: Family;Available 24 hours/day Type of Home: Mobile home Home Access: Stairs to enter Entrance Stairs-Rails: None Entrance Stairs-Number of Steps: 2   Home Layout: One level Home Equipment: Cane - quad;Other (comment);Rolling Walker (2 wheels);Rollator (4 wheels);BSC/3in1;Wheelchair - manual (walking stick) Additional Comments: Wife works as NT at [employer] Prior Level of Function : Independent/Modified Independent;Driving             Mobility Comments: Intermittently uses his walking stick, but primarily no AD as he carries heavy equipment for his jobs. ADLs Comments: Works as a Designer, industrial/product and works on a farm.     Hand Dominance   Dominant Hand: Left    Extremity/Trunk Assessment   Upper Extremity Assessment Upper Extremity Assessment: Defer to OT evaluation (noted L UE weakness compared to R, but able to lift against gravity)    Lower Extremity Assessment Lower Extremity Assessment: LLE deficits/detail LLE Deficits / Details: MMT scores of 4 hip flexion, 4+ knee extension, 4 ankle dorsiflexion (5/5 grossly on R); noted decreased sensation to light touch at medial aspect of foot/ankle; no dysmetria or  dysdiadochokinesia noted but potential ataxia with gait noted LLE Sensation: decreased light touch;decreased proprioception LLE Coordination: decreased gross motor    Cervical / Trunk Assessment Cervical / Trunk Assessment: Normal  Communication   Communication: No difficulties  Cognition Arousal/Alertness: Awake/alert Behavior During Therapy: WFL for tasks assessed/performed Overall Cognitive Status: Impaired/Different from baseline Area of Impairment: Memory, Problem solving                     Memory: Decreased short-term memory       Problem Solving: Slow processing General Comments: Pt needing extra time to process and follow cues at times. Pt's family reports he has been repeating himself with some noted memory deficits as well today compared to his baseline.        General Comments General comments (skin integrity, edema, etc.): Pt reporting double vision along with dizziness when ambulating, VSS on RA    Exercises     Assessment/Plan    PT Assessment Patient needs continued PT services  PT Problem List Decreased strength;Decreased activity tolerance;Decreased balance;Decreased mobility;Decreased coordination;Decreased cognition;Impaired sensation       PT Treatment Interventions DME instruction;Gait training;Stair training;Functional mobility training;Therapeutic activities;Balance training;Therapeutic exercise;Neuromuscular re-education;Cognitive remediation;Patient/family education    PT Goals (Current goals can be found in the Care Plan section)  Acute Rehab PT Goals Patient Stated Goal: to get back to work PT Goal Formulation: With patient/family Time For Goal Achievement: 08/17/21 Potential to Achieve Goals: Good  Frequency Min 4X/week     Co-evaluation               AM-PAC PT "6 Clicks" Mobility  Outcome Measure Help needed turning from your back to your side while in a flat bed without using bedrails?: A Little Help needed moving  from lying on your back to sitting on the side of a flat bed without using bedrails?: A Little Help needed moving to and from a bed to a chair (including a wheelchair)?: A Little Help needed standing up from a chair using your arms (e.g., wheelchair or bedside chair)?: A Little Help needed to walk in hospital room?: A Little Help needed climbing 3-5 steps with a railing? : A Lot 6 Click Score: 17    End of Session Equipment Utilized During Treatment: Gait belt Activity Tolerance: Patient tolerated treatment well Patient left: in bed;with call bell/phone within reach;with bed alarm set;with family/visitor present Nurse Communication: Mobility status PT Visit Diagnosis: Unsteadiness on feet (R26.81);Other abnormalities of gait and mobility (R26.89);Muscle weakness (generalized) (M62.81);Difficulty in walking, not elsewhere classified (R26.2);Other symptoms and signs involving the nervous system (R29.898)    Time: 0947-0962 PT Time Calculation (min) (ACUTE ONLY): 33 min   Charges:   PT Evaluation $PT Eval Moderate Complexity: 1 Mod PT Treatments $Therapeutic Activity: 8-22 mins        Raymond Gurney, PT, DPT Acute Rehabilitation Services  Office: [phone]   Jewel Baize 08/03/2021, 4:51 PM

## 2021-08-03 NOTE — ED Notes (Signed)
3 sec witnessed seizure in which eyes deciated to the right side and body shook all over. Pt in tears when came out of seizure. Tele neuro present  and witnessed with this RN

## 2021-08-03 NOTE — Progress Notes (Signed)
  Echocardiogram 2D Echocardiogram has been performed.  Cory Roberts 08/03/2021, 11:39 AM

## 2021-08-03 NOTE — Evaluation (Signed)
Clinical/Bedside Swallow Evaluation Patient Details  Name: Cory Roberts MRN: [id number] Date of Birth: 1960-03-25  Today's Date: 08/03/2021 Time: SLP Start Time (ACUTE ONLY): 0948 SLP Stop Time (ACUTE ONLY): 1000 SLP Time Calculation (min) (ACUTE ONLY): 12 min  Past Medical History:  Past Medical History:  Diagnosis Date   Arthritis 03/2011   Gout- Right foot   CVA (cerebral infarction)    GERD (gastroesophageal reflux disease)    Gout    Headache    Migraine Headaches   History of kidney stones    Raynaud's syndrome    Stroke [facility]) 2011   Past Surgical History:  Past Surgical History:  Procedure Laterality Date   COLONOSCOPY N/A 03/22/2015   Procedure: COLONOSCOPY;  Surgeon: West Bali, MD;  Location: [facility];  Service: Endoscopy;  Laterality: N/A;  1215   ESOPHAGOGASTRODUODENOSCOPY N/A 12/22/2014   Dr. Darrick Penna: 1. dysphagia due to mid-esophageal web and uncontrolled reflux esophagitis. 2. small hiatal hernia 3. multiple small gastric and duodenal ulcers and mild duodenitis . Path with reactive gastropathy, negative H. pylori    ESOPHAGOGASTRODUODENOSCOPY N/A 03/22/2015   Procedure: ESOPHAGOGASTRODUODENOSCOPY (EGD);  Surgeon: West Bali, MD;  Location: [facility];  Service: Endoscopy;  Laterality: N/A;   HERNIA REPAIR Left 2007   INCISION AND DRAINAGE Right 12/18/2014   Procedure: INCISION AND DRAINAGE THUMB;  Surgeon: Vickki Hearing, MD;  Location: [facility] ORS;  Service: Orthopedics;  Laterality: Right;   INCISION AND DRAINAGE Right 12/21/2014   Procedure: INCISION AND DRAINAGE RIGHT THUMB;  Surgeon: Vickki Hearing, MD;  Location: [facility] ORS;  Service: Orthopedics;  Laterality: Right;   incision and drainage of thumb  12/18/14   ORIF FEMUR FRACTURE Right 10/28/2020   Procedure: OPEN REDUCTION INTERNAL FIXATION (ORIF) DISTAL FEMUR FRACTURE;  Surgeon: Samson Frederic, MD;  Location: [facility] ORS;  Service: Orthopedics;  Laterality: Right;   TOTAL HIP ARTHROPLASTY Right  09/18/2016   TOTAL HIP ARTHROPLASTY Right 09/18/2016   Procedure: TOTAL HIP ARTHROPLASTY ANTERIOR APPROACH;  Surgeon: Samson Frederic, MD;  Location: [facility] OR;  Service: Orthopedics;  Laterality: Right;   TOTAL HIP ARTHROPLASTY Left 02/26/2017   Procedure: LEFT TOTAL HIP ARTHROPLASTY; ANTERIOR APPROACH;  Surgeon: Samson Frederic, MD;  Location: [facility] OR;  Service: Orthopedics;  Laterality: Left;   HPI:  Pt is a 61 y.o. male with who presented with c/o left-sided weakness. TNK was administered. Following this he had a 3 to 5-second episode of bilateral arm tensing with preserved consciousness. Seizure activity was suspected and he was loaded with Keppra and Ativan. CT head negative. Pt passed the [place], but SLP subsequently consulted due to pt's complaints of "choking" while swallowing for the past few weeks. EEG 7/5 WNL. PMH: CVA, GERD, hiatal hernia, EGD 2016, 2017 with dysphagia due to mid-esophageal web and uncontrolled reflux esophagitis and dilation in 2016.    Assessment / Plan / Recommendation  Clinical Impression  Pt was seen for bedside swallow evaluation with his wife and daughter present. All parties reported that the pt has been having difficulty with food "sticking", and not going down for approximately a year. Pt also reported that he notices nasal regurgitation and coughing if lays down after being symptomatic with foods such as a sloppy joe or chocolate. Pt identified the pharynx and mid chest as the sites of the sensation. Per the pt, he was similarly symptomatic in 2016 and 2017 and his symptoms were resolved with esophageal dilation on both occasions. Oral mechanism exam was [facility] and  he presented with adequate, natural dentition. He tolerated all solids and liquids without signs or symptoms of oropharyngeal dysphagia. A regular texture diet with thin liquids is recommended at this time with observance of esophageal precautions. Further acute skilled SLP services are not clinically indicated at  this time for swallowing. Considering the nature of pt's symptoms and history of esophageal dysphagia, SLP recommends follow up with GI. Pt stated that he forgot to mention these symptoms to his GI doctor when he saw him a few weeks prior, but agreed that he will contact GI after discharge about his symptoms. SLP Visit Diagnosis: Dysphagia, unspecified (R13.10)    Aspiration Risk  Mild aspiration risk    Diet Recommendation Regular;Thin liquid   Liquid Administration via: Cup;Straw Medication Administration: Whole meds with liquid Supervision: Patient able to self feed Compensations: Slow rate;Small sips/bites (esophageal precautions) Postural Changes: Seated upright at 90 degrees    Other  Recommendations Recommended Consults: Consider GI evaluation;Consider esophageal assessment Oral Care Recommendations: Oral care BID    Recommendations for follow up therapy are one component of a multi-disciplinary discharge planning process, led by the attending physician.  Recommendations may be updated based on patient status, additional functional criteria and insurance authorization.  Follow up Recommendations No SLP follow up      Assistance Recommended at Discharge    Functional Status Assessment    Frequency and Duration            Prognosis        Swallow Study   General Date of Onset: 08/03/20 HPI: Pt is a 61 y.o. male with who presented with c/o left-sided weakness. TNK was administered. Following this he had a 3 to 5-second episode of bilateral arm tensing with preserved consciousness. Seizure activity was suspected and he was loaded with Keppra and Ativan. CT head negative. Pt passed the [place], but SLP subsequently consulted due to pt's complaints of "choking" while swallowing for the past few weeks. EEG 7/5 WNL. PMH: CVA, GERD, hiatal hernia, EGD 2016, 2017 with dysphagia due to mid-esophageal web and uncontrolled reflux esophagitis and dilation in 2016. Type of Study: Bedside  Swallow Evaluation Previous Swallow Assessment: None Diet Prior to this Study: Regular;Thin liquids Temperature Spikes Noted: No Respiratory Status: Room air History of Recent Intubation: No Behavior/Cognition: Alert;Cooperative;Pleasant mood Oral Cavity Assessment: Within Functional Limits Oral Care Completed by SLP: No Oral Cavity - Dentition: Adequate natural dentition Vision: Functional for self-feeding Self-Feeding Abilities: Able to feed self Patient Positioning: Upright in bed;Postural control adequate for testing Baseline Vocal Quality: Normal Volitional Cough: Strong Volitional Swallow: Able to elicit    Oral/Motor/Sensory Function Overall Oral Motor/Sensory Function: Within functional limits   Ice Chips Ice chips: Within functional limits Presentation: Spoon   Thin Liquid Thin Liquid: Within functional limits Presentation: Straw;Cup    Nectar Thick Nectar Thick Liquid: Not tested   Honey Thick Honey Thick Liquid: Not tested   Puree Puree: Within functional limits Presentation: Spoon   Solid     Solid: Within functional limits Presentation: Self Fed     Anquinette Pierro I. Vear Clock, MS, CCC-SLP Acute Rehabilitation Services Office number [phone] Pager [phone]  Scheryl Marten 08/03/2021,11:06 AM

## 2021-08-03 NOTE — Plan of Care (Signed)
  Problem: Education: Goal: Knowledge of secondary prevention will improve (SELECT ALL) Outcome: Progressing   Problem: Coping: Goal: Will verbalize positive feelings about self Outcome: Progressing Goal: Will identify appropriate support needs Outcome: Progressing   Problem: Health Behavior/Discharge Planning: Goal: Ability to manage health-related needs will improve Outcome: Progressing   Problem: Self-Care: Goal: Ability to participate in self-care as condition permits will improve Outcome: Progressing Goal: Verbalization of feelings and concerns over difficulty with self-care will improve Outcome: Progressing Goal: Ability to communicate needs accurately will improve Outcome: Progressing   Problem: Nutrition: Goal: Risk of aspiration will decrease Outcome: Progressing   Problem: Education: Goal: Knowledge of General Education information will improve Description: Including pain rating scale, medication(s)/side effects and non-pharmacologic comfort measures Outcome: Progressing   Problem: Clinical Measurements: Goal: Ability to maintain clinical measurements within normal limits will improve Outcome: Progressing Goal: Will remain free from infection Outcome: Progressing Goal: Diagnostic test results will improve Outcome: Progressing Goal: Cardiovascular complication will be avoided Outcome: Progressing   Problem: Nutrition: Goal: Adequate nutrition will be maintained Outcome: Progressing   Problem: Coping: Goal: Level of anxiety will decrease Outcome: Progressing   Problem: Pain Managment: Goal: General experience of comfort will improve Outcome: Progressing   Problem: Safety: Goal: Ability to remain free from injury will improve Outcome: Progressing   Problem: Skin Integrity: Goal: Risk for impaired skin integrity will decrease Outcome: Progressing

## 2021-08-03 NOTE — Consult Note (Signed)
[facility] TeleNeurology Consult Services  Date of Birth:   09-24-1960 Date of Service:   08/02/2021 23:57:08  Diagnosis:       I63.9 - Cerebrovascular accident (CVA), unspecified mechanism (HCC)  Impression:      Patient with acute onset L hemiparesis and dysarthria LKW less than 3 hrs from hospital arrival so TNK was given. Then complained of Headache and very brief episode of odd behavior with muscle stiffening and shift in his gaze, so CT head was checked to r/o post TNK hemorrhage 40 min after TNK. There was no hemorrhage. No occlusion on CTA.     Exam most consistent with lacunar stroke syndrome, but patient not hypertensive.  Has AKI versus CKD, prior hyperglycemia, no other significant risk factors.    Patient's presentation of focal deficit in a lacunar syndrome with a new cortical symptom of brief focal seizure is odd. Please add contrast to MRI, (MRI with and without gado) to investigate other possibilities such as venous sinus thrombosis etc. Also consider EEG. Todds paralysis is on the differential.    Keppra 1500 was ordered. 750 mg BID    A1c to be checked  Echo is needed. Bubble PFO study due to lack of other risk factors and age < 9  No antiplatelets for 24 hrs, SCDs for DVT ppx  Our recommendations are outlined below. Recommendations: IV Tenecteplase was recommended.  I confirmed the following. (Patient name, DOB, MRN, dose of Thrombolytic and waste, weight completed by stretcher/scale not stated weight, have ED staff inform ED MD of thrombolytic decision) Thrombolytic bolus given With Complication.  Patient had a brief seizure after TNK bolus and R head pressure.   IV Tenecteplase Total Dose - 22.0 mg   Routine post Thrombolytic monitoring including neuro checks and blood pressure control during/after treatment Monitor blood pressure Check blood pressure and neuro assessment every 15 min for 2 h, then every 30 min for 6 h, and finally  every hour for 16 h.  Manage Blood Pressure per post Thrombolytic protocol.        Follow designated hospital protocol for admission and post thrombolytic care       CT brain 24 hours post Thrombolytic       NPO until swallowing screen performed and passed       No antiplatelet agents or anticoagulants (including heparin for DVT prophylaxis) in first 24 hours       No Foley catheter, nasogastric tube, arterial catheter or central venous catheter for 24 hr, unless absolutely necessary       Telemetry       Bedside swallow evaluation       HOB less than 30 degrees       Euglycemia       Avoid hyperthermia, PRN acetaminophen       DVT prophylaxis       Inpatient Neurology Consultation       Stroke evaluation as per inpatient neurology recommendations  Discussed with ED physician    ------------------------------------------------------------------------------  Advanced Imaging:  Metrics: Last Known Well: 08/02/2021 21:30:00 [facility] Notification Time: 08/02/2021 23:57:08 Arrival Time: 08/02/2021 23:50:00 Stamp Time: 08/02/2021 23:57:08 Initial Response Time: 08/02/2021 23:59:42 Symptoms: slurred speech, L weakness. Initial patient interaction: 08/03/2021 00:04:00 NIHSS Assessment Completed: 08/03/2021 00:06:03 Patient is a candidate for Thrombolytic. Thrombolytic Medical Decision: 08/03/2021 00:11:57 Needle Time: 08/03/2021 00:20:54 Weight Noted by Staff: 87.4 kg  CT head showed no acute hemorrhage or acute core infarct.  Primary Provider Notified of Diagnostic Impression and Management  Plan on: 08/03/2021 00:40:59    ------------------------------------------------------------------------------  Thrombolytic Contraindications:  Last Known Well > 4.5 hours: No CT Head showing hemorrhage: No Ischemic stroke within 3 months: No Severe head trauma within 3 months: No Intracranial/intraspinal surgery within 3 months: No History of intracranial hemorrhage:  No Symptoms and signs consistent with an SAH: No GI malignancy or GI bleed within 21 days: No Coagulopathy: Platelets <100 000 /mm3, INR >1.7, aPTT>40 s, or PT >15 s: No Treatment dose of LMWH within the previous 24 hrs: No Use of NOACs in past 48 hours: No Glycoprotein IIb/IIIa receptor inhibitors use: No Symptoms consistent with infective endocarditis: No Suspected aortic arch dissection: No Intra-axial intracranial neoplasm: No  Thrombolytic Decision and Management Plan: Management with thrombolytic treatment was explained to the Patient and Family as was risks and benefits and alternatives to the treatment. Patient agrees with the decision to proceed with thrombolytic treatment. . All questions were answered and the Patient and Family expressed understanding of the treatment plan.   History of Present Illness: Patient is a 61 year old Male.  Patient was brought by EMS for symptoms of slurred speech, L weakness. Patient says that he was normal around 43 (daughter Herbert Seta clarifies she spoke with him at 9:30pm and he was normal) and then at some point around 10pm he was "pourin out in a sweat" and cramping in both legs , both arms, and chest. Then developed L sided weakness, family over phone thought speech was slurred. 10:13 pm on July 4th called Heather to tell her of his symptoms over the phone, refused to let her call EMS before she checked on him, EMS eventually called and found him to have significant dysarthria and L hemiparesis. His symptoms had improved somewhat by time of my eval but TNK was given due to potentially disabling L leg weakness which would impair his gait and daily activities. After TNK patient had a very brief (<5 seconds) episode of stiffening and change in eye movements highly concerning for seizure. No significant change in vitals afterwards. Then noted a pressure from his R scalp inward.    Past Medical History:      Stroke      Migraine Headaches      There is  no history of Coronary Artery Disease [place] PMH:  Obesity, vertigo, raynauds  Medications:  No Anticoagulant use  Antiplatelet use: Yes aspirin 81 Reviewed EMR for current medications Other Medications Pertinent To Assessment Include: Protonix  Allergies:  Reviewed Description: codeine, hydrocodone  Social History: Patient Is: Married Smoking: No Alcohol Use: No  Family History:  There is no family history of premature cerebrovascular disease pertinent to this consultation  ROS : 14 Points Review of Systems was performed and was negative except mentioned in HPI.  Past Surgical History: There Is No Surgical History Contributory To Today's Visit There Is Surgical History of:  Plate in R leg August, Hip replacement, "screws"    Examination: BP(144/81), Pulse(76), Blood Glucose(90) 1A: Level of Consciousness - Alert; keenly responsive + 0 1B: Ask Month and Age - Both Questions Right + 0 1C: Blink Eyes & Squeeze Hands - Performs Both Tasks + 0 2: Test Horizontal Extraocular Movements - Normal + 0 3: Test Visual Fields - No Visual Loss + 0 4: Test Facial Palsy (Use Grimace if Obtunded) - Normal symmetry + 0 5A: Test Left Arm Motor Drift - Drift, but doesn't hit bed + 1 5B: Test Right Arm Motor Drift - No Drift for 10  Seconds + 0 6A: Test Left Leg Motor Drift - Drift, but doesn't hit bed + 1 6B: Test Right Leg Motor Drift - No Drift for 5 Seconds + 0 7: Test Limb Ataxia (FNF/Heel-Shin) - No Ataxia + 0 8: Test Sensation - Mild-Moderate Loss: Less Sharp/More Dull + 1 9: Test Language/Aphasia - Normal; No aphasia + 0 10: Test Dysarthria - Mild-Moderate Dysarthria: Slurring but can be understood + 1 11: Test Extinction/Inattention - No abnormality + 0  NIHSS Score: 4  Pre-Morbid Modified Rankin Scale: 0 Points = No symptoms at all   Patient/Family was informed the Neurology Consult would occur via TeleHealth consult by way of interactive audio and video telecommunications  and consented to receiving care in this manner.   Patient is being evaluated for possible acute neurologic impairment and high probability of imminent or life-threatening deterioration. I spent total of 61 minutes providing care to this patient, including time for face to face visit via telemedicine, review of medical records, imaging studies and discussion of findings with providers, the patient and/or family.   Dr Pamalee Leyden   [facility] [phone]  Case [id number]

## 2021-08-03 NOTE — Procedures (Signed)
Patient Name: Cory Roberts  MRN: [id number]  Epilepsy Attending: Charlsie Quest  Referring Physician/Provider: Rejeana Brock, MD  Date: 08/03/2021 Duration: 24.52 mins  Patient history: 61 year old male with left-sided deficits as well as some cramping. EEG to evaluate for seizure.  Level of alertness: Awake, asleep  AEDs during EEG study: None  Technical aspects: This EEG study was done with scalp electrodes positioned according to the 10-20 International system of electrode placement. Electrical activity was acquired at a sampling rate of 500Hz  and reviewed with a high frequency filter of 70Hz  and a low frequency filter of 1Hz . EEG data were recorded continuously and digitally stored.   Description: The posterior dominant rhythm consists of 8 Hz activity of moderate voltage (25-35 uV) seen predominantly in posterior head regions, symmetric and reactive to eye opening and eye closing. Sleep was characterized by vertex waves, sleep spindles (12 to 14 Hz), maximal frontocentral region. Hyperventilation and photic stimulation were not performed.     IMPRESSION: This study is within normal limits. No seizures or epileptiform discharges were seen throughout the recording.  Dsean Vantol 

## 2021-08-03 NOTE — Progress Notes (Signed)
EEG complete - results pending 

## 2021-08-03 NOTE — Progress Notes (Signed)
Code stroke  Call time  2347 Beeper   2347 Exam start  2358  Exam end  0003 Soc   0004 Epic   0004 Rad called  0005

## 2021-08-03 NOTE — ED Triage Notes (Signed)
Pt arrived from home via [facility] w c/o dysarthria and left side weakness. Per EMS upon arrival speech was incomprehensible. Pt has improved since ems original arrival

## 2021-08-03 NOTE — H&P (Signed)
Neurology H&P  CC: Left sided weakness  History is obtained from: Patient  HPI: Cory Roberts is a 61 y.o. male with a history of stroke who presents with this.  States that the first thing noticed was that his leg is were cramping, and this was bilaterally.  It then moved up his torso and involved his arms.  It was also bilateral in his upper extremities.  Following this, he noticed left-sided weakness and therefore presented to the emergency department at [facility].  There he was evaluated and found to have left arm numbness and weakness and tenecteplase was administered.  Following this he had a 3 to 5-second episode of bilateral arm tensing with preserved consciousness.  This was felt to be possibly seizure activity and therefore he was loaded with Keppra and Ativan.   He is also complaining of double vision, worse with leftward gaze.  LKW: 9:30 PM tnk given?:  Yes IR Thrombectomy? No, no LVO Modified Rankin Scale: 1-No significant post stroke disability and can perform usual duties with stroke symptoms NIHSS: 2(one for left arm drift, one for left face)   Past Medical History:  Diagnosis Date   Arthritis 03/2011   Gout- Right foot   CVA (cerebral infarction)    GERD (gastroesophageal reflux disease)    Gout    Headache    Migraine Headaches   History of kidney stones    Raynaud's syndrome    Stroke [facility]) 2011     Family History  Problem Relation Age of Onset   Cancer Mother    Hyperlipidemia Mother    Hypertension Mother    Heart attack Mother    Rheumatologic disease Mother    Cancer Father    Heart disease Father 87       Heart Disease before age 30   Heart attack Father    Diabetes Sister    Cancer Sister    Hyperlipidemia Sister    Hypertension Sister    Colon cancer Neg Hx      Social History:  reports that he quit smoking about 16 years ago. His smoking use included cigarettes. He has a 90.00 pack-year smoking history. He has never used  smokeless tobacco. He reports that he does not currently use alcohol. He reports that he does not use drugs.   Prior to Admission medications   Medication Sig Start Date End Date Taking? Authorizing Provider  albuterol (VENTOLIN HFA) 108 (90 Base) MCG/ACT inhaler Inhale 1-2 puffs into the lungs every 6 (six) hours as needed for wheezing or shortness of breath. 04/03/21   Wallis Bamberg, PA-C  aspirin 81 MG chewable tablet Chew 1 tablet (81 mg total) by mouth 2 (two) times daily. 10/29/20   Swinteck, Arlys John, MD  pantoprazole (PROTONIX) 40 MG tablet Take 1 tablet (40 mg total) by mouth daily. 06/09/21 06/09/22  Lanelle Bal, DO     Exam: Current vital signs: BP 128/76   Pulse 69   Temp (!) 97.5 F (36.4 C)   Resp 12   Ht 5\' 6"  (1.676 m)   Wt 85.5 kg   SpO2 96%   BMI 30.42 kg/m    Physical Exam  Constitutional: Appears well-developed and well-nourished.  Psych: Affect appropriate to situation Eyes: No scleral injection HENT: No OP obstrucion Head: Normocephalic.  Cardiovascular: Normal rate and regular rhythm.  Respiratory: Effort normal and breath sounds normal to anterior ascultation GI: Soft.  No distension. There is no tenderness.  Skin: WDI  Neuro:  Mental Status: Patient is awake, alert, oriented to person, place, month, year, and situation. Patient is able to give a clear and coherent history. No signs of aphasia or neglect Cranial Nerves: II: Visual Fields are full. Pupils are equal, round, and reactive to light.   III,IV, VI: EOMI without ptosis or diploplia.  V: Facial sensation is symmetric to temperature VII: Facial movement is symmetric.  VIII: hearing is intact to voice X: Uvula elevates symmetrically XI: Shoulder shrug is symmetric. XII: tongue is midline without atrophy or fasciculations.  Motor: He has good strength to confrontation, but drifts in the left arm. Sensory: Sensation is symmetric to light touch  Cerebellar: FNF intact bilaterally   I  have reviewed labs in epic and the pertinent results are: Creatinine 1.6 Potassium 3.4 Calcium 8.7  I have reviewed the images obtained: CT/CTA-negative  Primary Diagnosis:  Cerebral infarction, unspecified.  Secondary Diagnosis: Acute Kidney Failure, Hypocalcemia, and Hypokalemia   Impression: 61 year old male with left-sided deficits as well as some cramping.  I suspect that he may have been dehydrated and was outside when his cramping started.  This could also explain his AKI.  The left-sided deficits are little bit less clear and stroke is certainly a possibility and he is status post tenecteplase.  The bilateral tensing with preserved consciousness does not sound typical of seizure, but I do think an EEG is reasonable.  He has received Keppra, but I would not continue this unless his EEG is abnormal.  Plan: - HgbA1c, fasting lipid panel - MRI of the brain without contrast - Frequent neuro checks - Echocardiogram - Prophylactic therapy-none for 24 hours - Risk factor modification - Telemetry monitoring - PT consult, OT consult, Speech consult -EEG - Stroke team to follow  Ritta Slot, MD [facility] [phone]  If 7pm- 7am, please page neurology on call as listed in AMION.

## 2021-08-03 NOTE — Progress Notes (Signed)
2346 Code stroke activated- pre-elert 2350 door time-to CT 2357 TS paged [phone] Dr Ruthell Rummage on camera 0004 Pt back from CT 0013 TNK ordered-NIH 4-pharmacist bringing TNK to room 0020 4.71ml bolus pushed  0034 Pt noted to have witnessed seizure while Dr Ruthell Rummage, Ezequiel Ganser, and ED RN at bedside

## 2021-08-03 NOTE — Evaluation (Addendum)
Speech Language Pathology Evaluation Patient Details Name: Cory Roberts MRN: [id number] DOB: 15-Aug-1960 Today's Date: 08/03/2021 Time: 1001-1017 SLP Time Calculation (min) (ACUTE ONLY): 16 min  Problem List:  Patient Active Problem List   Diagnosis Date Noted   Acute ischemic stroke (HCC) 08/03/2021   Stroke (cerebrum) (HCC) 08/03/2021   Stress fracture of shaft of right femur 10/28/2020   Fracture, stress, femur, shaft, right, initial encounter 10/28/2020   Aortic atherosclerosis (HCC) 09/17/2019   Diverticulosis 09/17/2019   GERD (gastroesophageal reflux disease) 10/14/2018   Displaced fracture of left femoral neck (HCC) 02/26/2017   Closed left hip fracture, initial encounter (HCC) 02/23/2017   Mild renal insufficiency 02/23/2017   Avascular necrosis of hip, right (HCC) 09/18/2016   Encounter for screening colonoscopy 03/08/2015   PUD (peptic ulcer disease)    Dysphagia    Abscess of right thumb 12/18/2014   Abscess of thumb 12/15/2014   Cellulitis and abscess 12/15/2014   Obesity 10/31/2011   Dyslipidemia 10/31/2011   At risk for diabetes mellitus 10/31/2011   Migraine with aura 10/29/2011   History of CVA in adulthood 10/28/2011   Vertigo 10/28/2011   Pain in limb 05/15/2011   Past Medical History:  Past Medical History:  Diagnosis Date   Arthritis 03/2011   Gout- Right foot   CVA (cerebral infarction)    GERD (gastroesophageal reflux disease)    Gout    Headache    Migraine Headaches   History of kidney stones    Raynaud's syndrome    Stroke [facility]) 2011   Past Surgical History:  Past Surgical History:  Procedure Laterality Date   COLONOSCOPY N/A 03/22/2015   Procedure: COLONOSCOPY;  Surgeon: West Bali, MD;  Location: [facility];  Service: Endoscopy;  Laterality: N/A;  1215   ESOPHAGOGASTRODUODENOSCOPY N/A 12/22/2014   Dr. Darrick Penna: 1. dysphagia due to mid-esophageal web and uncontrolled reflux esophagitis. 2. small hiatal hernia 3. multiple small gastric  and duodenal ulcers and mild duodenitis . Path with reactive gastropathy, negative H. pylori    ESOPHAGOGASTRODUODENOSCOPY N/A 03/22/2015   Procedure: ESOPHAGOGASTRODUODENOSCOPY (EGD);  Surgeon: West Bali, MD;  Location: [facility];  Service: Endoscopy;  Laterality: N/A;   HERNIA REPAIR Left 2007   INCISION AND DRAINAGE Right 12/18/2014   Procedure: INCISION AND DRAINAGE THUMB;  Surgeon: Vickki Hearing, MD;  Location: [facility] ORS;  Service: Orthopedics;  Laterality: Right;   INCISION AND DRAINAGE Right 12/21/2014   Procedure: INCISION AND DRAINAGE RIGHT THUMB;  Surgeon: Vickki Hearing, MD;  Location: [facility] OR[facility];  Service: Orthopedics;  Laterality: Right;   incision and drainage of thumb  12/18/14   ORIF FEMUR FRACTURE Right 10/28/2020   Procedure: OPEN REDUCTION INTERNAL FIXATION (ORIF) DISTAL FEMUR FRACTURE;  Surgeon: Samson Frederic, MD;  Location: [facility] OR[facility];  Service: Orthopedics;  Laterality: Right;   TOTAL HIP ARTHROPLASTY Right 09/18/2016   TOTAL HIP ARTHROPLASTY Right 09/18/2016   Procedure: TOTAL HIP ARTHROPLASTY ANTERIOR APPROACH;  Surgeon: Samson Frederic, MD;  Location: [facility] OR;  Service: Orthopedics;  Laterality: Right;   TOTAL HIP ARTHROPLASTY Left 02/26/2017   Procedure: LEFT TOTAL HIP ARTHROPLASTY; ANTERIOR APPROACH;  Surgeon: Samson Frederic, MD;  Location: [facility] OR;  Service: Orthopedics;  Laterality: Left;   HPI:  Pt is a 61 y.o. male with who presented with c/o left-sided weakness. TNK was administered. Following this he had a 3 to 5-second episode of bilateral arm tensing with preserved consciousness. Seizure activity was suspected and he was loaded with Keppra and  Ativan. CT head negative. MRI pending at time of evaluation. Pt passed the [place], but SLP subsequently consulted due to pt's complaints of "choking" while swallowing for the past few weeks. EEG 7/5 WNL. PMH: CVA, GERD, hiatal hernia, EGD 2016, 2017 with dysphagia due to mid-esophageal web and uncontrolled reflux esophagitis  and dilation in 2016. SLP evaluation 10/30/2011; cogntion and communication WNL.   Assessment / Plan / Recommendation Clinical Impression  Pt participated in speech-language-cognition evaluation with his wife and daughter present. Pt reported that he has a high-school education and is employed full time dying [employer]. Pt's wife reported some observance of occasional difficulty with memory, but all parties denied any significant baseline cognitive deficits. Pt initially denied any acute changes in these areas, but at the end of the evaluation he stated that he exhibited more difficulty than he anticipates he would have prior to admission. Motor speech and language skills were [facility] Mental Status Examination was completed to evaluate the pt's cognitive-linguistic skills. He achieved a score of 21/30 which is below the normal limits of 27 or more out of 30 and is suggestive of a mild impairment. He exhibited difficulty in the areas of awareness, memory, complex problem solving, and executive function. Skilled SLP services are clinically indicated at this time to improve cognitive-linguistic function.    SLP Assessment  SLP Recommendation/Assessment: Patient needs continued Speech Lanaguage Pathology Services SLP Visit Diagnosis: Cognitive communication deficit (R41.841)    Recommendations for follow up therapy are one component of a multi-disciplinary discharge planning process, led by the attending physician.  Recommendations may be updated based on patient status, additional functional criteria and insurance authorization.    Follow Up Recommendations   (Continued SLP services at level of care recommended by PT/OT)    Assistance Recommended at Discharge  Intermittent Supervision/Assistance  Functional Status Assessment Patient has had a recent decline in their functional status and demonstrates the ability to make significant improvements in function in a reasonable and  predictable amount of time.  Frequency and Duration min 2x/week  2 weeks      SLP Evaluation Cognition  Overall Cognitive Status: Impaired/Different from baseline Arousal/Alertness: Awake/alert Orientation Level: Oriented X4 Year: 2023 Month: July Day of Week: Correct Attention: Focused;Sustained Focused Attention: Appears intact Sustained Attention: Appears intact Memory: Impaired Memory Impairment:  (Immediate: 5/5; delayed: 4/5; paragraph: 6/8) Awareness: Impaired Awareness Impairment: Emergent impairment Problem Solving: Impaired Problem Solving Impairment: Verbal complex (Money: 2/3; time: 1/1) Executive Function: Sequencing;Organizing Sequencing:  (Clock: 4/4 with self-correction) Organizing: Impaired Organizing Impairment: Verbal complex (backward digit span: 0/2)       Comprehension  Auditory Comprehension Overall Auditory Comprehension: Appears within functional limits for tasks assessed Yes/No Questions: Within Functional Limits Commands: Within Functional Limits Conversation: Complex    Expression Expression Primary Mode of Expression: Verbal Verbal Expression Overall Verbal Expression: Appears within functional limits for tasks assessed Initiation: No impairment Level of Generative/Spontaneous Verbalization: Conversation Repetition: No impairment Naming: No impairment Pragmatics: No impairment   Oral / Motor  Oral Motor/Sensory Function Overall Oral Motor/Sensory Function: Within functional limits Motor Speech Overall Motor Speech: Appears within functional limits for tasks assessed Respiration: Within functional limits Phonation: Normal Resonance: Within functional limits Articulation: Within functional limitis Intelligibility: Intelligible Motor Planning: Witnin functional limits Motor Speech Errors: Not applicable           Marsalis Beaulieu I. Vear Clock, MS, CCC-SLP Acute Rehabilitation Services Office number [phone] Pager [phone]  Scheryl Marten 08/03/2021, 11:23 AM

## 2021-08-03 NOTE — Progress Notes (Signed)
   08/03/21 1355  Clinical Encounter Type  Visited With Family  Visit Type Initial;Other (Comment) (Advanced Directive)  Referral From Nurse  Consult/Referral To Chaplain   Chaplain responded to a spiritual care consult for advanced directive information.  Patient's family was present but patient was asleep. I left the advanced directive packet with them and advised them that if there were any questions to let the patient's nurse know and someone would return.   Valerie Roys [facility]  [phone]

## 2021-08-03 NOTE — Progress Notes (Addendum)
STROKE TEAM PROGRESS NOTE   INTERVAL HISTORY His wife and daughter is at the bedside.  Did have a stroke 8-9 years ago. No residual from this stroke. States that the first thing noticed was that his leg is were cramping, and this was bilaterally.  It then moved up his torso and involved his arms.  It was also bilateral in his upper extremities.  Following this, he noticed left-sided weakness and therefore presented to the emergency department at [facility].  There he was evaluated and found to have left arm numbness and weakness and tenecteplase was administered after consultation with teleneurologist. He did also report diplopia worse with leftward gaze  Following this he had a 3 to 5-second episode of bilateral arm tensing with preserved consciousness.  This was felt to be possibly seizure activity and therefore he was loaded with Keppra and Ativan. EEG is negative  Start topamax 50mg  daily for headaches  Vitals:   08/03/21 0600 08/03/21 0630 08/03/21 0700 08/03/21 0800  BP: 127/68 109/66 103/67 105/61  Pulse: 63 (!) 57 (!) 54 64  Resp: 16 14 13 17   Temp: (!) 97.5 F (36.4 C) (!) 97.4 F (36.3 C)  98.1 F (36.7 C)  TempSrc: Oral Axillary  Axillary  SpO2: 94% 99% 99% 99%  Weight:      Height:       CBC:  Recent Labs  Lab 08/02/21 2354 08/02/21 2357 08/03/21 0313  WBC 12.1*  --  10.5  NEUTROABS 9.1*  --   --   HGB 15.4 15.3 14.6  HCT 44.8 45.0 43.0  MCV 94.7  --  94.1  PLT 244  --  248   Basic Metabolic Panel:  Recent Labs  Lab 08/02/21 2354 08/02/21 2357 08/03/21 0313  NA 137 140 139  K 3.4* 3.6 2.9*  CL 105 104 102  CO2 23  --  22  GLUCOSE 90 85 100*  BUN 37* 47* 31*  CREATININE 1.60* 1.70* 1.36*  CALCIUM 8.7*  --  9.3  MG 2.2  --   --    Lipid Panel:  Recent Labs  Lab 08/03/21 0313  CHOL 165  TRIG 42  HDL 29*  CHOLHDL 5.7  VLDL 8  LDLCALC 128*   HgbA1c:  Recent Labs  Lab 08/03/21 0313  HGBA1C 5.6   Urine Drug Screen: No results for  input(s): "LABOPIA", "COCAINSCRNUR", "LABBENZ", "AMPHETMU", "THCU", "LABBARB" in the last 168 hours.  Alcohol Level  Recent Labs  Lab 08/02/21 2354  ETH <10    IMAGING past 24 hours EEG adult  Result Date: 08/03/2021 10/03/21, MD     08/03/2021  8:22 AM Patient Name: Cory Roberts MRN: [id number] Epilepsy Attending: Lovena Le Referring Physician/Provider: [id number], MD Date: 08/03/2021 Duration: 24.52 mins Patient history: 61 year old male with left-sided deficits as well as some cramping. EEG to evaluate for seizure. Level of alertness: Awake, asleep AEDs during EEG study: None Technical aspects: This EEG study was done with scalp electrodes positioned according to the 10-20 International system of electrode placement. Electrical activity was acquired at a sampling rate of 500Hz  and reviewed with a high frequency filter of 70Hz  and a low frequency filter of 1Hz . EEG data were recorded continuously and digitally stored. Description: The posterior dominant rhythm consists of 8 Hz activity of moderate voltage (25-35 uV) seen predominantly in posterior head regions, symmetric and reactive to eye opening and eye closing. Sleep was characterized by vertex waves, sleep spindles (12  to 14 Hz), maximal frontocentral region. Hyperventilation and photic stimulation were not performed.   IMPRESSION: This study is within normal limits. No seizures or epileptiform discharges were seen throughout the recording. Priyanka Annabelle Harman   CT ANGIO HEAD NECK W WO CM (CODE STROKE)  Result Date: 08/03/2021 CLINICAL DATA:  Acute neurologic deficit EXAM: CT ANGIOGRAPHY HEAD AND NECK TECHNIQUE: Multidetector CT imaging of the head and neck was performed using the standard protocol during bolus administration of intravenous contrast. Multiplanar CT image reconstructions and MIPs were obtained to evaluate the vascular anatomy. Carotid stenosis measurements (when applicable) are obtained utilizing NASCET  criteria, using the distal internal carotid diameter as the denominator. RADIATION DOSE REDUCTION: This exam was performed according to the departmental dose-optimization program which includes automated exposure control, adjustment of the mA and/or kV according to patient size and/or use of iterative reconstruction technique. CONTRAST:  OMNIPAQUE IOHEXOL 350 MG/ML SOLN COMPARISON:  None Available. FINDINGS: CT HEAD FINDINGS Brain: There is no mass, hemorrhage or extra-axial collection. The size and configuration of the ventricles and extra-axial CSF spaces are normal. There is no acute or chronic infarction. The brain parenchyma is normal. Skull: The visualized skull base, calvarium and extracranial soft tissues are normal. Sinuses/Orbits: Pansinusitis.  The orbits are normal. CTA NECK FINDINGS SKELETON: There is no bony spinal canal stenosis. No lytic or blastic lesion. OTHER NECK: Normal pharynx, larynx and major salivary glands. No cervical lymphadenopathy. Unremarkable thyroid gland. UPPER CHEST: No pneumothorax or pleural effusion. No nodules or masses. AORTIC ARCH: There is calcific atherosclerosis of the aortic arch. There is no aneurysm, dissection or hemodynamically significant stenosis of the visualized portion of the aorta. Conventional 3 vessel aortic branching pattern. The visualized proximal subclavian arteries are widely patent. RIGHT CAROTID SYSTEM: No dissection, occlusion or aneurysm. Mild atherosclerotic calcification at the carotid bifurcation without hemodynamically significant stenosis. LEFT CAROTID SYSTEM: No dissection, occlusion or aneurysm. Mild atherosclerotic calcification at the carotid bifurcation without hemodynamically significant stenosis. VERTEBRAL ARTERIES: Left dominant configuration. Both origins are clearly patent. There is no dissection, occlusion or flow-limiting stenosis to the skull base (V1-V3 segments). CTA HEAD FINDINGS POSTERIOR CIRCULATION: --Vertebral arteries:  Normal V4 segments. --Inferior cerebellar arteries: Normal. --Basilar artery: Normal. --Superior cerebellar arteries: Normal. --Posterior cerebral arteries (PCA): Normal. ANTERIOR CIRCULATION: --Intracranial internal carotid arteries: Normal. --Anterior cerebral arteries (ACA): Normal. Both A1 segments are present. Patent anterior communicating artery (a-comm). --Middle cerebral arteries (MCA): Normal. VENOUS SINUSES: As permitted by contrast timing, patent. ANATOMIC VARIANTS: None Review of the MIP images confirms the above findings. IMPRESSION: 1. No emergent large vessel occlusion or hemodynamically significant stenosis of the head or neck. 2. Pansinusitis. 3. Aortic Atherosclerosis (ICD10-I70.0). Electronically Signed   By: Deatra Robinson M.D.   On: 08/03/2021 01:12   CT HEAD CODE STROKE WO CONTRAST  Result Date: 08/03/2021 CLINICAL DATA:  Code stroke.  Acute neurologic deficit EXAM: CT HEAD WITHOUT CONTRAST TECHNIQUE: Contiguous axial images were obtained from the base of the skull through the vertex without intravenous contrast. RADIATION DOSE REDUCTION: This exam was performed according to the departmental dose-optimization program which includes automated exposure control, adjustment of the mA and/or kV according to patient size and/or use of iterative reconstruction technique. COMPARISON:  None Available. FINDINGS: Brain: There is no mass, hemorrhage or extra-axial collection. The size and configuration of the ventricles and extra-axial CSF spaces are normal. The brain parenchyma is normal, without evidence of acute or chronic infarction. Vascular: No abnormal hyperdensity of the major intracranial arteries or  dural venous sinuses. No intracranial atherosclerosis. Skull: The visualized skull base, calvarium and extracranial soft tissues are normal. Sinuses/Orbits: Complete opacification of the left frontal and both maxillary sinuses. The orbits are normal. ASPECTS [facility] Stroke Program Early CT Score) -  Ganglionic level infarction (caudate, lentiform nuclei, internal capsule, insula, M1-M3 cortex): 7 - Supraganglionic infarction (M4-M6 cortex): 3 Total score (0-10 with 10 being normal): 10 IMPRESSION: 1. No acute intracranial abnormality. 2. ASPECTS is 10. 3. Chronic sinusitis These results were called by telephone at the time of interpretation on 08/03/2021 at 12:13 am to provider [facility] , who verbally acknowledged these results. Electronically Signed   By: Deatra Robinson M.D.   On: 08/03/2021 00:14    PHYSICAL EXAM  Physical Exam  Constitutional: Appears well-developed and well-nourished.   Cardiovascular: Normal rate and regular rhythm.  Respiratory: Effort normal, non-labored breathing  Neuro: Mental Status: Patient is awake, alert, oriented to person, place, month, year, and situation. Patient is able to give a clear and coherent history. No signs of aphasia or neglect Cranial Nerves: II: Visual Fields are full. Pupils are equal, round, and reactive to light.   III,IV, VI: EOMI without ptosis or diploplia.  V: Facial sensation is symmetric to temperature VII: Facial movement is symmetric resting and smiling VIII: Hearing is intact to voice X: Palate elevates symmetrically XI: Shoulder shrug is symmetric. XII: Tongue protrudes midline without atrophy or fasciculations.  Motor: Tone is normal. Bulk is normal. 5/5 strength was present in all four extremities.  Sensory: Sensation is symmetric to light touch and temperature in the arms and legs. No extinction to DSS present.  Cerebellar: FNF and HKS are intact bilaterally   ASSESSMENT/PLAN Cory Roberts is a 61 y.o. male with history of arthritis, CVA, GERD, gout, headaches, kidney stones, raynauds, presenting with diaphoresis. 9pm cramps in feet to calves all of the way up to both arms. Endorses double vision (side by side) and pain radiating up to the arms, cold sweats, slurred speech lasting a couple of hours. TNKase was  given at [facility]. Patient then had a witness seizure like episode and was loaded with keppra and ativan. EEG done at [facility] showed no seizure or epileptiform discharges.   TIA vs complicated migraine s/p TNK  Etiology:   uncontrolled risk factors Code Stroke CT head No acute abnormality.  CTA head & neck No LVO MRI  pending 2D Echo EF is 55-60% EEG- No seizures or epileptiform discharges were seen throughout the recording. LDL 128 HgbA1c 5.6 VTE prophylaxis - SCDs    Diet   Diet Heart Room service appropriate? Yes with Assist; Fluid consistency: Thin   aspirin 81 mg daily prior to admission, now on No antithrombotic until 24 hours post TNK  and then we will resume ASA if medically appropriate Therapy recommendations:  pending Disposition:  pending  Hypertension Home meds:  None Stable Permissive hypertension (OK if < 220/120) but gradually normalize in 5-7 days Long-term BP goal normotensive  Hyperlipidemia Home meds:  None LDL 128, goal < 70 Add Atorvastatin 40mg   Continue statin at discharge  Other Stroke Risk Factors Obesity, Body mass index is 30.42 kg/m., BMI >/= 30 associated with increased stroke risk, recommend weight loss, diet and exercise as appropriate  Hx stroke/TIA "Brainstem stroke"- 8-9 years ago, residual of balance issues Migraines Reports daily headache Add topamax 50mg  daily   Other Active Problems Seizure like episode EEG- WNL Discontinue keppra and ativan Hypokalemia K- 2.9- replaced AKI  Cr 1.36  Hospital day # 0  Patient seen and examined by NP/APP with MD. MD to update note as needed.   Elmer Picker, DNP, FNP-BC [facility] Pager: [phone]  STROKE MD  I have personally obtained history,examined this patient, reviewed notes, independently viewed imaging studies, participated in medical decision making and plan of care.ROS completed by me personally and pertinent positives fully documented  I have made any  additions or clarifications directly to the above note. Agree with note above.  Patient presented to outside hospital with dysarthria and left hemiparesis and was seen by teleneurology and given IV TNK.  He states he is recovered.  He however stated symptoms began with severe cramps.  He complains of a headache.  With a brief episode of muscle tension and gaze deviation and started on Keppra EEG is negative for seizures.  Neurological exam at present is nonfocal.  MRI scan is pending.  Continue close neurological observation and strict blood pressure control as per postthrombolytic protocol.  Mobilize out of bed.  Therapy consults.  Continue ongoing stroke work-up. This patient is critically ill and at significant risk of neurological worsening, death and care requires constant monitoring of vital signs, hemodynamics,respiratory and cardiac monitoring, extensive review of multiple databases, frequent neurological assessment, discussion with family, other specialists and medical decision making of high complexity.I have made any additions or clarifications directly to the above note.This critical care time does not reflect procedure time, or teaching time or supervisory time of PA/NP/Med Resident etc but could involve care discussion time.  I spent 30 minutes of neurocritical care time  in the care of  this patient.      Delia Heady, MD Medical Director [facility] Pager: [phone] 08/03/2021 4:53 PM   To contact Stroke Continuity provider, please refer to [email or web address]. After hours, contact General Neurology

## 2021-08-04 DIAGNOSIS — I639 Cerebral infarction, unspecified: Secondary | ICD-10-CM | POA: Diagnosis not present

## 2021-08-04 MED ORDER — ATORVASTATIN CALCIUM 40 MG PO TABS: 40.0000 mg | ORAL_TABLET | Freq: Every day | ORAL | 1 refills | Status: DC

## 2021-08-04 MED ORDER — ASPIRIN 81 MG PO CHEW: 81.0000 mg | CHEWABLE_TABLET | Freq: Every day | ORAL | 1 refills | Status: DC

## 2021-08-04 MED ORDER — ASPIRIN 81 MG PO CHEW
81.0000 mg | CHEWABLE_TABLET | Freq: Every day | ORAL | Status: DC
Start: 2021-08-04 — End: 2021-08-04
  Administered 2021-08-04: 81 mg via ORAL
  Filled 2021-08-04: qty 1

## 2021-08-04 MED ORDER — TOPIRAMATE 50 MG PO TABS: 50.0000 mg | ORAL_TABLET | Freq: Every day | ORAL | 1 refills | Status: DC

## 2021-08-04 NOTE — Discharge Summary (Addendum)
Stroke Discharge Summary  Patient ID: Cory Roberts   MRN: [id number]      DOB: 10-25-1960  Date of Admission: 08/02/2021 Date of Discharge: 08/04/2021  Attending Physician:  Antony Contras MD Consultant(s):    None  Patient's PCP:  Jake Samples, PA-C  DISCHARGE DIAGNOSIS: Stroke like episode possibly TIA s/p IV TNK Principal Problem:   Acute ischemic stroke [facility]) Active Problems:   Stroke (cerebrum) ([place]) Complicated migraine   Allergies as of 08/04/2021       Reactions   Codeine Nausea And Vomiting   Hydrocodone Nausea Only        Medication List     STOP taking these medications    naproxen sodium 220 MG tablet Commonly known as: ALEVE       TAKE these medications    albuterol 108 (90 Base) MCG/ACT inhaler Commonly known as: VENTOLIN HFA Inhale 1-2 puffs into the lungs every 6 (six) hours as needed for wheezing or shortness of breath. What changed: how much to take   aspirin 81 MG chewable tablet Chew 1 tablet (81 mg total) by mouth daily. Start taking on: August 05, 2021   atorvastatin 40 MG tablet Commonly known as: LIPITOR Take 1 tablet (40 mg total) by mouth daily. Start taking on: August 05, 2021   pantoprazole 40 MG tablet Commonly known as: PROTONIX Take 1 tablet (40 mg total) by mouth daily.   topiramate 50 MG tablet Commonly known as: TOPAMAX Take 1 tablet (50 mg total) by mouth daily. Start taking on: August 05, 2021   [facility] NA Place 1 spray into both nostrils 2 (two) times daily.        LABORATORY STUDIES CBC    Component Value Date/Time   WBC 10.5 08/03/2021 0313   RBC 4.57 08/03/2021 0313   HGB 14.6 08/03/2021 0313   HCT 43.0 08/03/2021 0313   PLT 248 08/03/2021 0313   MCV 94.1 08/03/2021 0313   MCH 31.9 08/03/2021 0313   MCHC 34.0 08/03/2021 0313   RDW 13.2 08/03/2021 0313   LYMPHSABS 1.7 08/02/2021 2354   MONOABS 1.2 (H) 08/02/2021 2354   EOSABS 0.1 08/02/2021 2354   BASOSABS 0.1 08/02/2021 2354   CMP     Component Value Date/Time   NA 139 08/03/2021 0313   K 2.9 (L) 08/03/2021 0313   CL 102 08/03/2021 0313   CO2 22 08/03/2021 0313   GLUCOSE 100 (H) 08/03/2021 0313   BUN 31 (H) 08/03/2021 0313   CREATININE 1.36 (H) 08/03/2021 0313   CALCIUM 9.3 08/03/2021 0313   PROT 7.4 08/02/2021 2354   ALBUMIN 4.3 08/02/2021 2354   AST 25 08/02/2021 2354   ALT 18 08/02/2021 2354   ALKPHOS 124 08/02/2021 2354   BILITOT 1.0 08/02/2021 2354   GFRNONAA 58 (L) 08/03/2021 0313   GFRAA 46 (L) 09/17/2019 1729   COAGS Lab Results  Component Value Date   INR 1.1 08/02/2021   INR 1.0 10/27/2020   INR 0.99 02/23/2017   Lipid Panel    Component Value Date/Time   CHOL 165 08/03/2021 0313   TRIG 42 08/03/2021 0313   HDL 29 (L) 08/03/2021 0313   CHOLHDL 5.7 08/03/2021 0313   VLDL 8 08/03/2021 0313   LDLCALC 128 (H) 08/03/2021 0313   HgbA1C  Lab Results  Component Value Date   HGBA1C 5.6 08/03/2021   Urinalysis    Component Value Date/Time   COLORURINE YELLOW 08/03/2021 [place]  08/03/2021 1052   LABSPEC >1.046 (H) 08/03/2021 1052   PHURINE 5.0 08/03/2021 1052   GLUCOSEU NEGATIVE 08/03/2021 1052   HGBUR NEGATIVE 08/03/2021 1052   BILIRUBINUR NEGATIVE 08/03/2021 1052   KETONESUR 5 (A) 08/03/2021 1052   PROTEINUR NEGATIVE 08/03/2021 1052   NITRITE NEGATIVE 08/03/2021 1052   LEUKOCYTESUR NEGATIVE 08/03/2021 1052   Urine Drug Screen     Component Value Date/Time   LABOPIA NONE DETECTED 08/03/2021 1052   COCAINSCRNUR NONE DETECTED 08/03/2021 1052   LABBENZ NONE DETECTED 08/03/2021 1052   AMPHETMU NONE DETECTED 08/03/2021 1052   THCU NONE DETECTED 08/03/2021 1052   LABBARB NONE DETECTED 08/03/2021 1052    Alcohol Level    Component Value Date/Time   ETH <10 08/02/2021 2354     SIGNIFICANT DIAGNOSTIC STUDIES MR BRAIN WO CONTRAST  Result Date: 08/03/2021 CLINICAL DATA:  Stroke follow-up EXAM: MRI HEAD WITHOUT CONTRAST TECHNIQUE: Multiplanar, multiecho pulse  sequences of the brain and surrounding structures were obtained without intravenous contrast. COMPARISON:  10/28/2011 FINDINGS: Brain: No acute infarct, mass effect or extra-axial collection. No acute or chronic hemorrhage. There is multifocal hyperintense T2-weighted signal within the white matter. Parenchymal volume and CSF spaces are normal. The midline structures are normal. Vascular: Major flow voids are preserved. Skull and upper cervical spine: Normal calvarium and skull base. Visualized upper cervical spine and soft tissues are normal. Sinuses/Orbits:Pansinusitis.  No mastoid effusion.  Normal orbits. IMPRESSION: 1. No acute intracranial abnormality. 2. Findings of chronic microvascular ischemia. 3. Pansinusitis. Electronically Signed   By: Ulyses Jarred M.D.   On: 08/03/2021 18:53   ECHOCARDIOGRAM COMPLETE  Result Date: 08/03/2021    ECHOCARDIOGRAM REPORT   Patient Name:   Cory Roberts Date of Exam: 08/03/2021 Medical Rec #:  [id number]    Height:       66.0 in Accession #:    [id number]   Weight:       188.5 lb Date of Birth:  [birth year]    BSA:          1.950 m Patient Age:    61 years     BP:           103/70 mmHg Patient Gender: M            HR:           65 bpm. Exam Location:  Inpatient Procedure: 2D Echo, Cardiac Doppler and Color Doppler Indications:    Stroke  History:        Patient has prior history of Echocardiogram examinations, most                 recent 10/29/2011. Risk Factors:Dyslipidemia.  Sonographer:    Johny Chess RDCS Sonographer#2:  Ronny Flurry Referring Phys: [phone] MCNEILL P KIRKPATRICK IMPRESSIONS  1. Left ventricular ejection fraction, by estimation, is 55 to 60%. The left ventricle has normal function. The left ventricle has no regional wall motion abnormalities. Left ventricular diastolic parameters were normal.  2. Right ventricular systolic function is normal. The right ventricular size is normal.  3. The mitral valve is normal in structure. No evidence of mitral  valve regurgitation. No evidence of mitral stenosis.  4. The aortic valve is normal in structure. Aortic valve regurgitation is not visualized. No aortic stenosis is present.  5. The inferior vena cava is normal in size with greater than 50% respiratory variability, suggesting right atrial pressure of 3 mmHg. FINDINGS  Left Ventricle: Left ventricular ejection fraction, by estimation, is 55 to  60%. The left ventricle has normal function. The left ventricle has no regional wall motion abnormalities. The left ventricular internal cavity size was normal in size. There is  no left ventricular hypertrophy. Left ventricular diastolic parameters were normal. Right Ventricle: The right ventricular size is normal. No increase in right ventricular wall thickness. Right ventricular systolic function is normal. Left Atrium: Left atrial size was normal in size. Right Atrium: Right atrial size was normal in size. Pericardium: There is no evidence of pericardial effusion. Mitral Valve: The mitral valve is normal in structure. No evidence of mitral valve regurgitation. No evidence of mitral valve stenosis. Tricuspid Valve: The tricuspid valve is normal in structure. Tricuspid valve regurgitation is trivial. No evidence of tricuspid stenosis. Aortic Valve: The aortic valve is normal in structure. Aortic valve regurgitation is not visualized. No aortic stenosis is present. Pulmonic Valve: The pulmonic valve was normal in structure. Pulmonic valve regurgitation is trivial. No evidence of pulmonic stenosis. Aorta: The aortic root is normal in size and structure. Venous: The inferior vena cava is normal in size with greater than 50% respiratory variability, suggesting right atrial pressure of 3 mmHg. IAS/Shunts: No atrial level shunt detected by color flow Doppler.  LEFT VENTRICLE PLAX 2D LVIDd:         4.50 cm   Diastology LVIDs:         3.10 cm   LV e' medial:    8.49 cm/s LV PW:         0.90 cm   LV E/e' medial:  7.4 LV IVS:         0.90 cm   LV e' lateral:   9.03 cm/s LVOT diam:     2.40 cm   LV E/e' lateral: 6.9 LV SV:         102 LV SV Index:   52 LVOT Area:     4.52 cm  RIGHT VENTRICLE             IVC RV Basal diam:  2.90 cm     IVC diam: 1.90 cm RV S prime:     13.10 cm/s TAPSE (M-mode): 2.8 cm LEFT ATRIUM             Index        RIGHT ATRIUM           Index LA diam:        2.90 cm 1.49 cm/m   RA Area:     12.30 cm LA Vol (A2C):   34.8 ml 17.85 ml/m  RA Volume:   27.10 ml  13.90 ml/m LA Vol (A4C):   36.6 ml 18.77 ml/m LA Biplane Vol: 38.4 ml 19.69 ml/m  AORTIC VALVE LVOT Vmax:   119.00 cm/s LVOT Vmean:  71.300 cm/s LVOT VTI:    0.225 m  AORTA Ao Root diam: 3.60 cm Ao Asc diam:  3.20 cm MITRAL VALVE MV Area (PHT): 3.03 cm    SHUNTS MV Decel Time: 250 msec    Systemic VTI:  0.22 m MV E velocity: 62.70 cm/s  Systemic Diam: 2.40 cm MV A velocity: 69.20 cm/s MV E/A ratio:  0.91 Kardie Tobb DO Electronically signed by Berniece Salines DO Signature Date/Time: 08/03/2021/12:13:13 PM    Final    EEG adult  Result Date: 08/03/2021 Lora Havens, MD     08/03/2021  8:22 AM Patient Name: TEVAN IRWIN MRN: [id number] Epilepsy Attending: Lora Havens Referring Physician/Provider: Greta Doom, MD Date: 08/03/2021 Duration: 24.52  mins Patient history: 61 year old male with left-sided deficits as well as some cramping. EEG to evaluate for seizure. Level of alertness: Awake, asleep AEDs during EEG study: None Technical aspects: This EEG study was done with scalp electrodes positioned according to the 10-20 International system of electrode placement. Electrical activity was acquired at a sampling rate of 500Hz  and reviewed with a high frequency filter of 70Hz  and a low frequency filter of 1Hz . EEG data were recorded continuously and digitally stored. Description: The posterior dominant rhythm consists of 8 Hz activity of moderate voltage (25-35 uV) seen predominantly in posterior head regions, symmetric and reactive to eye opening and eye  closing. Sleep was characterized by vertex waves, sleep spindles (12 to 14 Hz), maximal frontocentral region. Hyperventilation and photic stimulation were not performed.   IMPRESSION: This study is within normal limits. No seizures or epileptiform discharges were seen throughout the recording. Priyanka   CT ANGIO HEAD NECK W WO CM (CODE STROKE)  Result Date: 08/03/2021 CLINICAL DATA:  Acute neurologic deficit EXAM: CT ANGIOGRAPHY HEAD AND NECK TECHNIQUE: Multidetector CT imaging of the head and neck was performed using the standard protocol during bolus administration of intravenous contrast. Multiplanar CT image reconstructions and MIPs were obtained to evaluate the vascular anatomy. Carotid stenosis measurements (when applicable) are obtained utilizing NASCET criteria, using the distal internal carotid diameter as the denominator. RADIATION DOSE REDUCTION: This exam was performed according to the departmental dose-optimization program which includes automated exposure control, adjustment of the mA and/or kV according to patient size and/or use of iterative reconstruction technique. CONTRAST:  Annabelle Harman OMNIPAQUE IOHEXOL 350 MG/ML SOLN COMPARISON:  None Available. FINDINGS: CT HEAD FINDINGS Brain: There is no mass, hemorrhage or extra-axial collection. The size and configuration of the ventricles and extra-axial CSF spaces are normal. There is no acute or chronic infarction. The brain parenchyma is normal. Skull: The visualized skull base, calvarium and extracranial soft tissues are normal. Sinuses/Orbits: Pansinusitis.  The orbits are normal. CTA NECK FINDINGS SKELETON: There is no bony spinal canal stenosis. No lytic or blastic lesion. OTHER NECK: Normal pharynx, larynx and major salivary glands. No cervical lymphadenopathy. Unremarkable thyroid gland. UPPER CHEST: No pneumothorax or pleural effusion. No nodules or masses. AORTIC ARCH: There is calcific atherosclerosis of the aortic arch. There is no  aneurysm, dissection or hemodynamically significant stenosis of the visualized portion of the aorta. Conventional 3 vessel aortic branching pattern. The visualized proximal subclavian arteries are widely patent. RIGHT CAROTID SYSTEM: No dissection, occlusion or aneurysm. Mild atherosclerotic calcification at the carotid bifurcation without hemodynamically significant stenosis. LEFT CAROTID SYSTEM: No dissection, occlusion or aneurysm. Mild atherosclerotic calcification at the carotid bifurcation without hemodynamically significant stenosis. VERTEBRAL ARTERIES: Left dominant configuration. Both origins are clearly patent. There is no dissection, occlusion or flow-limiting stenosis to the skull base (V1-V3 segments). CTA HEAD FINDINGS POSTERIOR CIRCULATION: --Vertebral arteries: Normal V4 segments. --Inferior cerebellar arteries: Normal. --Basilar artery: Normal. --Superior cerebellar arteries: Normal. --Posterior cerebral arteries (PCA): Normal. ANTERIOR CIRCULATION: --Intracranial internal carotid arteries: Normal. --Anterior cerebral arteries (ACA): Normal. Both A1 segments are present. Patent anterior communicating artery (a-comm). --Middle cerebral arteries (MCA): Normal. VENOUS SINUSES: As permitted by contrast timing, patent. ANATOMIC VARIANTS: None Review of the MIP images confirms the above findings. IMPRESSION: 1. No emergent large vessel occlusion or hemodynamically significant stenosis of the head or neck. 2. Pansinusitis. 3. Aortic Atherosclerosis (ICD10-I70.0). Electronically Signed   By: M M.D.   On: 08/03/2021 01:12   CT HEAD CODE STROKE  WO CONTRAST  Result Date: 08/03/2021 CLINICAL DATA:  Code stroke.  Acute neurologic deficit EXAM: CT HEAD WITHOUT CONTRAST TECHNIQUE: Contiguous axial images were obtained from the base of the skull through the vertex without intravenous contrast. RADIATION DOSE REDUCTION: This exam was performed according to the departmental dose-optimization program  which includes automated exposure control, adjustment of the mA and/or kV according to patient size and/or use of iterative reconstruction technique. COMPARISON:  None Available. FINDINGS: Brain: There is no mass, hemorrhage or extra-axial collection. The size and configuration of the ventricles and extra-axial CSF spaces are normal. The brain parenchyma is normal, without evidence of acute or chronic infarction. Vascular: No abnormal hyperdensity of the major intracranial arteries or dural venous sinuses. No intracranial atherosclerosis. Skull: The visualized skull base, calvarium and extracranial soft tissues are normal. Sinuses/Orbits: Complete opacification of the left frontal and both maxillary sinuses. The orbits are normal. ASPECTS [facility] Stroke Program Early CT Score) - Ganglionic level infarction (caudate, lentiform nuclei, internal capsule, insula, M1-M3 cortex): 7 - Supraganglionic infarction (M4-M6 cortex): 3 Total score (0-10 with 10 being normal): 10 IMPRESSION: 1. No acute intracranial abnormality. 2. ASPECTS is 10. 3. Chronic sinusitis These results were called by telephone at the time of interpretation on 08/03/2021 at 12:13 am to provider [facility] , who verbally acknowledged these results. Electronically Signed   By: Ulyses Jarred M.D.   On: 08/03/2021 00:14      HISTORY OF PRESENT ILLNESS Mr. VATSAL ORAVEC is a 61 y.o. male with history of arthritis, CVA, GERD, gout, headaches, kidney stones, raynauds, presenting with diaphoresis. 9pm cramps in feet to calves all of the way up to both arms. Endorses double vision (side by side) and pain radiating up to the arms, cold sweats, slurred speech lasting a couple of hours. TNKase was given at [facility]. Patient then had a witness seizure like episode and was loaded with keppra and ativan. EEG done at [facility] showed no seizure or epileptiform discharges.    HOSPITAL COURSE TIA vs complicated migraine vs stroke like episode s/p TNK   Etiology:   uncontrolled risk factors Code Stroke CT head No acute abnormality.  CTA head & neck No LVO MRI  no acute abnormality 2D Echo EF is 55-60% EEG- No seizures or epileptiform discharges were seen throughout the recording. LDL 128 HgbA1c 5.6 VTE prophylaxis - SCDs       Diet    Diet Heart Room service appropriate? Yes with Assist; Fluid consistency: Thin        aspirin 81 mg daily prior to admission, now on No antithrombotic until 24 hours post TNK  and then we will resume ASA if medically appropriate Therapy recommendations:  pending Disposition:  pending   Hypertension Home meds:  None Stable Permissive hypertension (OK if < 220/120) but gradually normalize in 5-7 days Long-term BP goal normotensive   Hyperlipidemia Home meds:  None LDL 128, goal < 70 Add Atorvastatin 40mg   Continue statin at discharge   Other Stroke Risk Factors Obesity, Body mass index is 30.42 kg/m., BMI >/= 30 associated with increased stroke risk, recommend weight loss, diet and exercise as appropriate  Hx stroke/TIA "Brainstem stroke"- 8-9 years ago, residual of balance issues Migraines Reports daily headache Add topamax 50mg  daily    Other Active Problems Seizure like episode EEG- WNL Discontinue keppra and ativan Hypokalemia K- 2.9- replaced AKI Cr 1.36  DISCHARGE EXAM Blood pressure (!) 107/91, pulse 71, temperature (!) 97.2 F (36.2 C), temperature  source Axillary, resp. rate (!) 21, height 5\' 6"  (1.676 m), weight 85.5 kg, SpO2 97 %.  Constitutional: Appears well-developed and well-nourished.   Cardiovascular: Normal rate and regular rhythm.  Respiratory: Effort normal, non-labored breathing   Neuro: Mental Status: Patient is awake, alert, oriented to person, place, month, year, and situation. Patient is able to give a clear and coherent history. No signs of aphasia or neglect Cranial Nerves: II: Visual Fields are full. Pupils are equal, round, and reactive to  light.   III,IV, VI: EOMI without ptosis or diploplia.  V: Facial sensation is symmetric to temperature VII: Facial movement is symmetric resting and smiling VIII: Hearing is intact to voice X: Palate elevates symmetrically XI: Shoulder shrug is symmetric. XII: Tongue protrudes midline without atrophy or fasciculations.  Motor: Tone is normal. Bulk is normal. 5/5 strength was present in all four extremities.  Sensory: Sensation is symmetric to light touch and temperature in the arms and legs. No extinction to DSS present.  Cerebellar: FNF and HKS are intact bilaterally    Discharge Diet       Diet   Diet Heart Room service appropriate? Yes with Assist; Fluid consistency: Thin   liquids  DISCHARGE PLAN Disposition:  Home with outpatient PT aspirin 81 mg daily for secondary stroke prevention  Continue Topamax 50mg  daily for headache prevention Ongoing stroke risk factor control by Primary Care Physician at time of discharge Follow-up PCP , PA-C in 2 weeks. Follow-up in [facility] in 8 weeks, office to schedule an appointment.   32 minutes were spent preparing discharge.  Patient seen and examined by NP/APP with MD. MD to update note as needed.   , DNP, FNP-BC [facility] Pager: [phone]  I have personally obtained history,examined this patient, reviewed notes, independently viewed imaging studies, participated in medical decision making and plan of care.ROS completed by me personally and pertinent positives fully documented  I have made any additions or clarifications directly to the above note. Agree with note above.    Elmer Picker, MD Medical Director [facility] Pager: [phone] 08/04/2021 3:23 PM

## 2021-08-04 NOTE — TOC Transition Note (Signed)
Transition of Care [facility]) - CM/SW Discharge Note   Patient Details  Name: Cory Roberts MRN: [id number] Date of Birth: February 01, 1960  [facility] CM/SW Contact:  Glennon Mac, RN Phone Number: 08/04/2021, 10:06 AM   Clinical Narrative:    Pt is a 61 y.o. male who presented 08/02/21 with L arm and leg weakness and slurred speech. Pt also with muscle cramps in legs and diaphoresis. TNK was administered. EEG negative for seizures.  Pt medically stable for discharge home today with family to provide needed assistance.  PT recommending OP follow up, and patient agreeable to referral.  Referral to [facility] for follow up.  Chaplain referral placed to assist pt/family with completion of Advanced Directives.     Final next level of care: OP Rehab Barriers to Discharge: Barriers Resolved                       Discharge Plan and Services   Discharge Planning Services: CM Consult                                 Social Determinants of Health (SDOH) Interventions     Readmission Risk Interventions     No data to display         Quintella Baton, RN, BSN  Trauma/Neuro ICU Case Manager [phone]

## 2021-08-04 NOTE — Progress Notes (Signed)
Physical Therapy Treatment Patient Details Name: Cory Roberts MRN: [id number] DOB: 1960-08-04 Today's Date: 08/04/2021   History of Present Illness Pt is a 61 y.o. male who presented 08/02/21 with L arm and leg weakness and slurred speech. Pt also with muscle cramps in legs and diaphoresis. tNK was administered. EEG negative for seizures. CT/CTA and MRI of head was negative for acute intracranial abnormalities. PMH: CVA, GERD, gout, Raynaud's syndrome    PT Comments    Pt is demonstrating quick, great progress with mobility as he was able to transfer and ambulate without UE support, assistance, or LOB today, even with dynamic gait challenges. Pt did have x1 LOB descending stairs without UE support, needing minA to recover. Educated pt and family on slowly and safely progressing activity and challenges to simulate work conditions and to have assistance on stairs that do not have rails until his balance improves. The LOB could have been due more to his chronic knee issues and decreased activity in the past day or so compared to his normal. Will continue to follow acutely. As pt had progressed quickly, updated d/c recs to no PT follow-up.    Recommendations for follow up therapy are one component of a multi-disciplinary discharge planning process, led by the attending physician.  Recommendations may be updated based on patient status, additional functional criteria and insurance authorization.  Follow Up Recommendations  No PT follow up     Assistance Recommended at Discharge Intermittent Supervision/Assistance  Patient can return home with the following Assistance with cooking/housework;Direct supervision/assist for medications management;Direct supervision/assist for financial management;Assist for transportation;Help with stairs or ramp for entrance   Equipment Recommendations  None recommended by PT    Recommendations for Other Services       Precautions / Restrictions  Precautions Precautions: Fall (low) Restrictions Weight Bearing Restrictions: No     Mobility  Bed Mobility               General bed mobility comments: Pt up in chair upon arrival.    Transfers Overall transfer level: Needs assistance Equipment used: None Transfers: Sit to/from Stand Sit to Stand: Supervision           General transfer comment: Supervision for safety, no LOB    Ambulation/Gait Ambulation/Gait assistance: Supervision Gait Distance (Feet): 200 Feet Assistive device: None Gait Pattern/deviations: WFL(Within Functional Limits) Gait velocity: WFL Gait velocity interpretation: >4.37 ft/sec, indicative of normal walking speed   General Gait Details: Pt with much improved speed, stability, and gait pattern. No LOB with changes in head position, speed, or directions. Supervision for safety   Stairs Stairs: Yes Stairs assistance: Min assist, Min guard Stair Management: One rail Right, No rails, Alternating pattern, Step to pattern, Forwards Number of Stairs: 10 General stair comments: Ascends without rail first half then with R rail second half due to reports of chronic knee issues, reciprocal pattern, min guard for safety, no LOB. Descends without rail first half, x1 LOB on first step with minA and reactional steps to recover, step-to pattern. Then used R rail final half with reciprocal pattern, no LOB, min guard for safety   Wheelchair Mobility    Modified Rankin (Stroke Patients Only) Modified Rankin (Stroke Patients Only) Pre-Morbid Rankin Score: No symptoms Modified Rankin: Moderate disability     Balance Overall balance assessment: Needs assistance Sitting-balance support: No upper extremity supported, Feet supported Sitting balance-[name]: Good     Standing balance support: No upper extremity supported, During functional activity, Single extremity supported  Standing balance-[name]: Good Standing balance comment: Able to  perform dynamic gait challenges without LOB, but LOB 1x descending stairs without UE support, minA to recover.                            Cognition Arousal/Alertness: Awake/alert Behavior During Therapy: WFL for tasks assessed/performed Overall Cognitive Status: Impaired/Different from baseline Area of Impairment: Memory, Problem solving                     Memory: Decreased short-term memory       Problem Solving: Slow processing General Comments: Pt needing extra time to process and follow cues at times. Pt's family confirms pt still is not at baseline memory and processing speed.        Exercises      General Comments General comments (skin integrity, edema, etc.): Educated pt and family to slowly progress his activity to simulate work conditions and to have guarding/assistance descending stairs when no rails available until he improves      Pertinent Vitals/Pain Pain Assessment Pain Assessment: Faces Faces Pain Scale: No hurt Pain Intervention(s): Monitored during session    Home Living                          Prior Function            PT Goals (current goals can now be found in the care plan section) Acute Rehab PT Goals Patient Stated Goal: to get back to work PT Goal Formulation: With patient/family Time For Goal Achievement: 08/17/21 Potential to Achieve Goals: Good Progress towards PT goals: Progressing toward goals    Frequency    Min 4X/week      PT Plan Discharge plan needs to be updated    Co-evaluation              AM-PAC PT "6 Clicks" Mobility   Outcome Measure  Help needed turning from your back to your side while in a flat bed without using bedrails?: None Help needed moving from lying on your back to sitting on the side of a flat bed without using bedrails?: None Help needed moving to and from a bed to a chair (including a wheelchair)?: A Little Help needed standing up from a chair using your arms  (e.g., wheelchair or bedside chair)?: A Little Help needed to walk in hospital room?: A Little Help needed climbing 3-5 steps with a railing? : A Little 6 Click Score: 20    End of Session Equipment Utilized During Treatment: Gait belt Activity Tolerance: Patient tolerated treatment well Patient left: with call bell/phone within reach;with family/visitor present;in chair Nurse Communication: Mobility status PT Visit Diagnosis: Unsteadiness on feet (R26.81);Other abnormalities of gait and mobility (R26.89);Muscle weakness (generalized) (M62.81);Difficulty in walking, not elsewhere classified (R26.2);Other symptoms and signs involving the nervous system (R29.898)     Time: 7564-3329 PT Time Calculation (min) (ACUTE ONLY): 9 min  Charges:  $Gait Training: 8-22 mins                     Raymond Gurney, PT, DPT Acute Rehabilitation Services  Office: [phone]    Jewel Baize 08/04/2021, 10:27 AM

## 2021-08-04 NOTE — Evaluation (Signed)
Occupational Therapy Evaluation Patient Details Name: Cory Roberts MRN: [id number] DOB: 05-27-1960 Today's Date: 08/04/2021   History of Present Illness Pt is a 61 y.o. male who presented 08/02/21 with L arm and leg weakness and slurred speech. Pt also with muscle cramps in legs and diaphoresis. tNK was administered. EEG negative for seizures. CT/CTA and MRI of head was negative for acute intracranial abnormalities. PMH: CVA, GERD, gout, Raynaud's syndrome   Clinical Impression   PTA pt independent  with ADL/IADL tasks and works full time for [employer]. Demonstrates cognitive deficits as indicated by scoring an 8/28 on the Short Blessed Test of Memory and Concentration and mild L sided incoordination. Recommend follow up with OT at an outpt center to maximize independence with IADL tasks and facilitate safe return to work - CM notified.  Recommend pt refrain from driving until cleared by MD.     Recommendations for follow up therapy are one component of a multi-disciplinary discharge planning process, led by the attending physician.  Recommendations may be updated based on patient status, additional functional criteria and insurance authorization.   Follow Up Recommendations  Outpatient OT    Assistance Recommended at Discharge Intermittent Supervision/Assistance  Patient can return home with the following A little help with walking and/or transfers;A little help with bathing/dressing/bathroom;Direct supervision/assist for medications management;Direct supervision/assist for financial management;Assist for transportation    Functional Status Assessment  Patient has had a recent decline in their functional status and demonstrates the ability to make significant improvements in function in a reasonable and predictable amount of time.  Equipment Recommendations  None recommended by OT    Recommendations for Other Services       Precautions / Restrictions Precautions Precautions: Fall  (low) Restrictions Weight Bearing Restrictions: No      Mobility Bed Mobility Overal bed mobility: Modified Independent                  Transfers Overall transfer level: Needs assistance Equipment used: None Transfers: Sit to/from Stand Sit to Stand: Supervision                  Balance Overall balance assessment: Needs assistance Sitting-balance support: No upper extremity supported, Feet supported Sitting balance-[name]: Good     Standing balance support: Bilateral upper extremity supported, Single extremity supported, No upper extremity supported, During functional activity (Simultaneous filing. User may not have seen previous data.) Standing balance-[name]: Fair (Simultaneous filing. User may not have seen previous data.) Standing balance comment:  (Simultaneous filing. User may not have seen previous data.)                           ADL either performed or assessed with clinical judgement   ADL Overall ADL's : At baseline (close to baseline; has showerseat; recommend use of seat until returns to normal; Needs S with med and financial management)                                             Vision   Vision Assessment?: Yes Eye Alignment: Within Functional Limits Ocular Range of Motion: Within Functional Limits Alignment/Gaze Preference: Within Defined Limits Tracking/Visual Pursuits: Able to track stimulus in all quads without difficulty Saccades: Within functional limits Convergence: Within functional limits Visual Fields: No apparent deficits Additional Comments: complaining of diplopia on admission  but reports has returned to normal     Perception     Praxis      Pertinent Vitals/Pain Pain Assessment Faces Pain Scale: No hurt     Hand Dominance Left   Extremity/Trunk Assessment Upper Extremity Assessment Upper Extremity Assessment: LUE deficits/detail LUE Deficits / Details: mild incoordination and  weakness but functional   Lower Extremity Assessment LLE Deficits / Details: MMT scores of 4 hip flexion, 4+ knee extension, 4 ankle dorsiflexion (5/5 grossly on R); noted decreased sensation to light touch at medial aspect of foot/ankle; no dysmetria or dysdiadochokinesia noted but potential ataxia with gait noted LLE Sensation: decreased light touch;decreased proprioception LLE Coordination: decreased gross motor   Cervical / Trunk Assessment Cervical / Trunk Assessment: Normal   Communication Communication Communication: No difficulties   Cognition Arousal/Alertness: Awake/alert Behavior During Therapy: WFL for tasks assessed/performed Overall Cognitive Status: Impaired/Different from baseline Area of Impairment: Memory, Problem solving, Awareness                     Memory: Decreased short-term memory     Awareness: Emergent Problem Solving: Slow processing General Comments: Scored 8/28 on the Short Blessed test of memeory and concentration - unable to count backwards (2 omissions; unable to state months of the year in reverse order)     General Comments  family present (Simultaneous filing. User may not have seen previous data.)    Exercises     Shoulder Instructions      Home Living Family/patient expects to be discharged to:: Private residence Living Arrangements: Spouse/significant other Available Help at Discharge: Family;Available 24 hours/day Type of Home: Mobile home Home Access: Stairs to enter Entrance Stairs-Number of Steps: 2 Entrance Stairs-Rails: None Home Layout: One level     Bathroom Shower/Tub: Chief Strategy Officer: Standard Bathroom Accessibility: Yes How Accessible: Accessible via walker Home Equipment: Cane - quad;Other (comment);Rolling Walker (2 wheels);Rollator (4 wheels);BSC/3in1;Wheelchair - manual (walking stick)   Additional Comments: Wife works as NT at [employer] With: Spouse    Prior  Functioning/Environment Prior Level of Function : Independent/Modified Independent;Driving             Mobility Comments: Intermittently uses his walking stick, but primarily no AD as he carries heavy equipment for his jobs. 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        OT Problem List: Decreased activity tolerance;Impaired balance (sitting and/or standing);Decreased cognition;Decreased safety awareness      OT Treatment/Interventions:      OT Goals(Current goals can be found in the care plan section) Acute Rehab OT Goals Patient Stated Goal: to go home today OT Goal Formulation: With patient/family Time For Goal Achievement: 08/18/21 Potential to Achieve Goals: Good  OT Frequency:      Co-evaluation              AM-PAC OT "6 Clicks" Daily Activity     Outcome Measure Help from another person eating meals?: None Help from another person taking care of personal grooming?: None Help from another person toileting, which includes using toliet, bedpan, or urinal?: A Little Help from another person bathing (including washing, rinsing, drying)?: A Little Help from another person to put on and taking off regular upper body clothing?: A Little Help from another person to put on and taking off regular lower body clothing?: A Little 6 Click Score: 20   End of Session Equipment Utilized During Treatment: Gait belt  Nurse Communication: Mobility status;Other (comment) (DC needs)  Activity Tolerance: Patient tolerated treatment well Patient left: in chair;with call bell/phone within reach;with chair alarm set;with family/visitor present  OT Visit Diagnosis: Unsteadiness on feet (R26.81);Other symptoms and signs involving cognitive function                Time: 0901-0930 OT Time Calculation (min): 29 min Charges:  OT General Charges $OT Visit: 1 Visit OT Evaluation $OT Eval Moderate Complexity: 1 Mod OT Treatments $Self Care/Home Management : 8-22  mins  Maurie Boettcher, OT/L   Acute OT Clinical Specialist Acute Rehabilitation Services Pager [phone] Office [phone]   [facility] 08/04/2021, 10:34 AM

## 2021-08-11 DIAGNOSIS — Z6827 Body mass index (BMI) 27.0-27.9, adult: Secondary | ICD-10-CM | POA: Diagnosis not present

## 2021-08-11 DIAGNOSIS — Z8673 Personal history of transient ischemic attack (TIA), and cerebral infarction without residual deficits: Secondary | ICD-10-CM | POA: Diagnosis not present

## 2021-08-11 DIAGNOSIS — E663 Overweight: Secondary | ICD-10-CM | POA: Diagnosis not present

## 2021-08-11 DIAGNOSIS — G44209 Tension-type headache, unspecified, not intractable: Secondary | ICD-10-CM | POA: Diagnosis not present

## 2021-08-11 DIAGNOSIS — E876 Hypokalemia: Secondary | ICD-10-CM | POA: Diagnosis not present

## 2021-08-19 ENCOUNTER — Telehealth: Payer: Self-pay | Admitting: *Deleted

## 2021-08-19 NOTE — Chronic Care Management (AMB) (Signed)
  Care Coordination  Note  08/19/2021 Name: KISHAWN PICKAR MRN: [id number] DOB: February 12, 1960  Reatha Harps Gassmann is a 61 y.o. year old male who is a primary care patient of Ladon Applebaum. I reached out to Lovena Le by phone today to offer care coordination services.       Follow up plan: Unsuccessful telephone outreach attempt made. A HIPAA compliant phone message was left for the patient providing contact information and requesting a return call.  The care guide will reach out to the patient again over the next 7 days.  The patient has been provided with contact information for the care coordination team.   Gwenevere Ghazi  Care Coordination Care Guide  Direct Dial: [phone]

## 2021-08-19 NOTE — Chronic Care Management (AMB) (Signed)
  Care Coordination  Note  08/19/2021 Name: [name] MRN: [id number] DOB: 09-21-1960  Cory Roberts is a 61 y.o. year old male who is a primary care patient of Ladon Applebaum. I reached out to Lovena Le by phone today to offer care coordination services.      Mr. Janik was given information about Care Coordination services today including:  The Care Coordination services include support from the care team which includes your Nurse Coordinator, Clinical Social Worker, or Pharmacist.  The Care Coordination team is here to help remove barriers to the health concerns and goals most important to you. Care Coordination services are voluntary and the patient may decline or stop services at any time by request to their care team member.   Patient agreed to services and verbal consent obtained.   Follow up plan: Telephone appointment with care coordination team member scheduled for:08/23/21  [facility] Coordination Care Guide  Direct Dial: [phone]

## 2021-08-23 ENCOUNTER — Ambulatory Visit: Payer: Self-pay | Admitting: *Deleted

## 2021-08-23 ENCOUNTER — Encounter: Payer: Self-pay | Admitting: *Deleted

## 2021-08-23 NOTE — Patient Outreach (Signed)
  Care Coordination   Initial Visit Note   08/23/2021 Name: Cory Roberts MRN: [id number] DOB: [birth year]  Cory Roberts is a 61 y.o. year old male who sees Avis Epley, [place] for primary care. I spoke with  Cory Roberts by phone today.  What matters to the patients health and wellness today?  Completion of Advanced Directives (Living Will and Healthcare Power of Attorney) Documents.  Patient reported completing Living Will and Healthcare Power of Attorney documents during recent hospitalization.  Patient has been encouraged to schedule a follow-up appointment with Primary Care Provider, Terie Purser, as well as provide a copy of completed Advanced Directives to scan into electronic medical record in Epic.   Goals Addressed   None     SDOH assessments and interventions completed:   Yes SDOH Interventions Today    Flowsheet Row Most Recent Value  SDOH Interventions   Food Insecurity Interventions Intervention Not Indicated  Financial Strain Interventions Intervention Not Indicated  Housing Interventions Intervention Not Indicated  Physical Activity Interventions Intervention Not Indicated  Stress Interventions Intervention Not Indicated  Social Connections Interventions Intervention Not Indicated  Transportation Interventions Intervention Not Indicated       Care Coordination Interventions Activated:  Yes  Care Coordination Interventions:  Yes, provided.  Follow up plan: No further intervention required.  Encounter Outcome:  Pt. Visit Completed.  Danford Bad, BSW, MSW, LCSW  Licensed Restaurant manager, [employer] [facility]  Mailing [place] [address] [place], [place] [address] Physical Address[address] [place], [place] [address] Toll Free Main # [phone] Fax # [phone] Cell # [phone] [email or web address]

## 2021-08-23 NOTE — Patient Instructions (Signed)
Visit Information  Thank you for taking time to visit with me today. Please don't hesitate to contact me if I can be of assistance to you.   Following are the goals we discussed today:  Encouraged to schedule a follow-up appointment with Primary Care Provider, Terie Purser, as well as provide a copy of completed Advanced Directives (Living Will and Healthcare Power of Gerrit Friends) documents to scan into electronic medical record in Epic  Please call the care guide team at [phone] if you need to cancel or reschedule your appointment.   If you are experiencing a Mental Health or Behavioral Health Crisis or need someone to talk to, please call the Suicide and Crisis Lifeline: 988 call the [place] National Suicide Prevention Lifeline: 478-704-4578 or TTY: 701 579 4149 TTY 708-098-2900) to talk to a trained counselor call 1-800-273-TALK (toll free, 24 hour hotline) go to [facility] [address], [place] [phone]) call the [facility] Crisis Line: [phone] call 911  Patient verbalizes understanding of instructions and care plan provided today and agrees to view in MyChart. Active MyChart status and patient understanding of how to access instructions and care plan via MyChart confirmed with patient.     No further follow up required.  Danford Bad, BSW, MSW, LCSW  Licensed Restaurant manager, fast food [facility]  Mailing [place] [address] [place], [place] [address] Physical Address[address] [place], [place] [address] Toll Free Main # [phone] Fax # [phone] Cell # [phone] [email or web address]

## 2021-11-09 ENCOUNTER — Other Ambulatory Visit: Payer: Self-pay

## 2021-11-09 NOTE — Patient Outreach (Signed)
  Care Coordination   11/09/2021 Name: Cory Roberts MRN: [id number] DOB: [birth year]   Telephone outreach to patient to obtain mRS was successfully completed. MRS= [place] Management Assistant [phone]

## 2021-12-23 DIAGNOSIS — E663 Overweight: Secondary | ICD-10-CM | POA: Diagnosis not present

## 2021-12-23 DIAGNOSIS — Z6829 Body mass index (BMI) 29.0-29.9, adult: Secondary | ICD-10-CM | POA: Diagnosis not present

## 2021-12-23 DIAGNOSIS — R030 Elevated blood-pressure reading, without diagnosis of hypertension: Secondary | ICD-10-CM | POA: Diagnosis not present

## 2021-12-23 DIAGNOSIS — J029 Acute pharyngitis, unspecified: Secondary | ICD-10-CM | POA: Diagnosis not present

## 2021-12-23 DIAGNOSIS — J019 Acute sinusitis, unspecified: Secondary | ICD-10-CM | POA: Diagnosis not present

## 2022-06-05 ENCOUNTER — Encounter: Payer: Self-pay | Admitting: Internal Medicine

## 2022-06-21 ENCOUNTER — Other Ambulatory Visit: Payer: Self-pay | Admitting: Internal Medicine

## 2022-06-21 DIAGNOSIS — K219 Gastro-esophageal reflux disease without esophagitis: Secondary | ICD-10-CM

## 2022-10-26 ENCOUNTER — Encounter: Payer: Self-pay | Admitting: Gastroenterology

## 2022-10-26 NOTE — Progress Notes (Signed)
Referring Provider: Avis Epley, PA* Primary Care Physician:  Avis Epley, PA-C Primary GI Physician: Dr. Marletta Lor  Chief Complaint  Patient presents with   Follow-up    Follow up. Refill medications     HPI:   Cory Roberts is a 62 y.o. male presenting today for routine follow-up of GERD with need for medication refills.  Reports he is doing very well on pantoprazole 40 mg daily.  Denies nausea, vomiting, abdominal pain, dysphagia.  Bowels continue to move well without BRBPR or melena.  He has no specific concerns for me today.   Last colonoscopy on 03/22/15: Impression with mild diverticulosis in the sigmoid colon, moderate-sized internal hemorrhoids.  Otherwise normal.  Next colonoscopy in 10 years (2027).  Last EGD on 03/22/15: Impression with stricture of the gastroesophageal junction, medium sized hiatal hernia, mild nonerosive gastritis and moderate duodenitis.  Recommended continuing Protonix twice daily.  Past Medical History:  Diagnosis Date   Arthritis 03/2011   Gout- Right foot   CVA (cerebral infarction)    GERD (gastroesophageal reflux disease)    Gout    Headache    Migraine Headaches   History of kidney stones    PUD (peptic ulcer disease) 2016   Raynaud's syndrome    Stroke [facility]) 2011    Past Surgical History:  Procedure Laterality Date   COLONOSCOPY N/A 03/22/2015   Procedure: COLONOSCOPY;  Surgeon: West Bali, MD;  [facility];  Service: Endoscopy;  Laterality: N/A;  1215   ESOPHAGOGASTRODUODENOSCOPY N/A 12/22/2014   Dr. Darrick Penna: 1. dysphagia due to mid-esophageal web and uncontrolled reflux esophagitis. 2. small hiatal hernia 3. multiple small gastric and duodenal ulcers and mild duodenitis . Path with reactive gastropathy, negative H. pylori    ESOPHAGOGASTRODUODENOSCOPY N/A 03/22/2015   Procedure: ESOPHAGOGASTRODUODENOSCOPY (EGD);  Surgeon: West Bali, MD;  [facility];  Service: Endoscopy;  Laterality: N/A;    HERNIA REPAIR Left 2007   INCISION AND DRAINAGE Right 12/18/2014   Procedure: INCISION AND DRAINAGE THUMB;  Surgeon: Vickki Hearing, MD;  Location: [facility];  Service: Orthopedics;  Laterality: Right;   INCISION AND DRAINAGE Right 12/21/2014   Procedure: INCISION AND DRAINAGE RIGHT THUMB;  Surgeon: Vickki Hearing, MD;  Location: [facility];  Service: Orthopedics;  Laterality: Right;   incision and drainage of thumb  12/18/14   ORIF FEMUR FRACTURE Right 10/28/2020   Procedure: OPEN REDUCTION INTERNAL FIXATION (ORIF) DISTAL FEMUR FRACTURE;  Surgeon: Samson Frederic, MD;  Location: [facility];  Service: Orthopedics;  Laterality: Right;   TOTAL HIP ARTHROPLASTY Right 09/18/2016   TOTAL HIP ARTHROPLASTY Right 09/18/2016   Procedure: TOTAL HIP ARTHROPLASTY ANTERIOR APPROACH;  Surgeon: Samson Frederic, MD;  Location: [facility];  Service: Orthopedics;  Laterality: Right;   TOTAL HIP ARTHROPLASTY Left 02/26/2017   Procedure: LEFT TOTAL HIP ARTHROPLASTY; ANTERIOR APPROACH;  Surgeon: Samson Frederic, MD;  Location: [facility];  Service: Orthopedics;  Laterality: Left;    Current Outpatient Medications  Medication Sig Dispense Refill   albuterol (VENTOLIN HFA) 108 (90 Base) MCG/ACT inhaler Inhale 1-2 puffs into the lungs every 6 (six) hours as needed for wheezing or shortness of breath. (Patient taking differently: Inhale 2 puffs into the lungs every 6 (six) hours as needed for wheezing or shortness of breath.) 18 g 0   aspirin 81 MG chewable tablet Chew 1 tablet (81 mg total) by mouth daily. 30 tablet 1   Multiple Vitamin (MULTIVITAMIN ADULT) TABS 1 tablet Orally  Once a day for 30 day(s)     pantoprazole (PROTONIX) 40 MG tablet Take 1 tablet (40 mg total) by mouth daily before breakfast. 30 tablet 11   No current facility-administered medications for this visit.    Allergies as of 10/27/2022 - Review Complete 10/27/2022  Allergen Reaction Noted   Codeine Nausea And Vomiting 05/08/2011   Hydrocodone Nausea Only  10/28/2011    Family History  Problem Relation Age of Onset   Cancer Mother    Hyperlipidemia Mother    Hypertension Mother    Heart attack Mother    Rheumatologic disease Mother    Cancer Father    Heart disease Father 33       Heart Disease before age 26   Heart attack Father    Diabetes Sister    Cancer Sister    Hyperlipidemia Sister    Hypertension Sister    Colon cancer Neg Hx     Social History   Socioeconomic History   Marital status: Married    Spouse name: Haylen Shelnutt   Number of children: 2   Years of education: 12   Highest education level: 12th grade  Occupational History   Not on file  Tobacco Use   Smoking status: Former    Current packs/day: 0.00    Average packs/day: 3.0 packs/day for 30.0 years (90.0 ttl pk-yrs)    Types: Cigarettes    Start date: 01/31/1975    Quit date: 01/30/2005    Years since quitting: 17.7    Passive exposure: Past   Smokeless tobacco: Never  Vaping Use   Vaping status: Never Used  Substance and Sexual Activity   Alcohol use: Not Currently   Drug use: No   Sexual activity: Not Currently  Other Topics Concern   Not on file  Social History Narrative   Not on file   Social Determinants of Health   Financial Resource Strain: Low Risk  (08/23/2021)   Overall Financial Resource Strain (CARDIA)    Difficulty of Paying Living Expenses: Not hard at all  Food Insecurity: No Food Insecurity (08/23/2021)   Hunger Vital Sign    Worried About Running Out of Food in the Last Year: Never true    Ran Out of Food in the Last Year: Never true  Transportation Needs: No Transportation Needs (08/23/2021)   PRAPARE - Administrator, Civil Service (Medical): No    Lack of Transportation (Non-Medical): No  Physical Activity: Insufficiently Active (08/23/2021)   Exercise Vital Sign    Days of Exercise per Week: 3 days    Minutes of Exercise per Session: 30 min  Stress: No Stress Concern Present (08/23/2021)   [employer] of  Occupational Health - Occupational Stress Questionnaire    Feeling of Stress : Not at all  Social Connections: Moderately Integrated (08/23/2021)   Social Connection and Isolation Panel [NHANES]    Frequency of Communication with Friends and Family: More than three times a week    Frequency of Social Gatherings with Friends and Family: More than three times a week    Attends Religious Services: More than 4 times per year    Active Member of [employer] or Organizations: No    Attends Banker Meetings: Never    Marital Status: Married    Review of Systems: Gen: Denies fever, chills, cold or flulike symptoms, presyncope, syncope. CV: Denies chest pain, palpitations. Resp: Denies dyspnea, cough. GI: See PI Heme: See HPI  Physical Exam: BP  136/73 (BP Location: Right Arm, Patient Position: Sitting, Cuff Size: Normal)   Pulse 69   Temp 97.8 F (36.6 C) (Temporal)   Ht 5\' 8"  (1.727 m)   Wt 195 lb 12.8 oz (88.8 kg)   SpO2 97%   BMI 29.77 kg/m  General:   Alert and oriented. No distress noted. Pleasant and cooperative.  Head:  Normocephalic and atraumatic. Eyes:  Conjuctiva clear without scleral icterus. Heart:  S1, S2 present without murmurs appreciated. Lungs:  Clear to auscultation bilaterally. No wheezes, rales, or rhonchi. No distress.  Abdomen:  +BS, soft, non-tender and non-distended. No rebound or guarding. No HSM or masses noted. Msk:  Symmetrical without gross deformities. Normal posture. Extremities:  Without edema. Neurologic:  Alert and  oriented x4 Psych:  Normal mood and affect.    Assessment:  62 year old male presenting today for routine follow-up of GERD with need for medication refills.  He is doing very well on pantoprazole 40 mg daily without alarm symptoms.  No other GI concerns today.   Plan:  Continue pantoprazole 40 mg daily.  1 years worth of refills sent to pharmacy. Follow-up in 1 year or sooner if needed.   Ermalinda Memos, PA-C [facility]  Gastroenterology 10/27/2022

## 2022-10-27 ENCOUNTER — Encounter: Payer: Self-pay | Admitting: Gastroenterology

## 2022-10-27 ENCOUNTER — Ambulatory Visit: Payer: BC Managed Care – PPO | Admitting: Gastroenterology

## 2022-10-27 DIAGNOSIS — K219 Gastro-esophageal reflux disease without esophagitis: Secondary | ICD-10-CM | POA: Diagnosis not present

## 2022-10-27 MED ORDER — PANTOPRAZOLE SODIUM 40 MG PO TBEC
40.0000 mg | DELAYED_RELEASE_TABLET | Freq: Every day | ORAL | 11 refills | Status: DC
Start: 2022-10-27 — End: 2023-11-21

## 2022-10-27 NOTE — Patient Instructions (Addendum)
Continue pantoprazole 40 mg daily.  I have sent refills to your pharmacy.  It was good to see you today!  Will plan to see back in 1 year or sooner if needed.  Ermalinda Memos, PA-C [facility] Gastroenterology

## 2023-08-16 DIAGNOSIS — M170 Bilateral primary osteoarthritis of knee: Secondary | ICD-10-CM | POA: Diagnosis not present

## 2023-09-28 ENCOUNTER — Encounter: Payer: Self-pay | Admitting: Gastroenterology

## 2023-10-30 ENCOUNTER — Other Ambulatory Visit: Payer: Self-pay | Admitting: Gastroenterology

## 2023-10-30 DIAGNOSIS — K219 Gastro-esophageal reflux disease without esophagitis: Secondary | ICD-10-CM

## 2023-11-21 ENCOUNTER — Ambulatory Visit (INDEPENDENT_AMBULATORY_CARE_PROVIDER_SITE_OTHER): Admitting: Internal Medicine

## 2023-11-21 ENCOUNTER — Encounter: Payer: Self-pay | Admitting: Internal Medicine

## 2023-11-21 VITALS — BP 150/89 | HR 62 | Temp 97.5°F | Ht 68.0 in | Wt 194.1 lb

## 2023-11-21 DIAGNOSIS — K219 Gastro-esophageal reflux disease without esophagitis: Secondary | ICD-10-CM

## 2023-11-21 DIAGNOSIS — Z1211 Encounter for screening for malignant neoplasm of colon: Secondary | ICD-10-CM

## 2023-11-21 MED ORDER — PANTOPRAZOLE SODIUM 40 MG PO TBEC: 40.0000 mg | DELAYED_RELEASE_TABLET | Freq: Every day | ORAL | 11 refills | Status: DC

## 2023-11-21 NOTE — Progress Notes (Signed)
 Referring Provider: Leonce Lucie PARAS, PA* Primary Care Physician:  Leonce Lucie PARAS, PA-C Primary GI:  Dr. Cindie  Chief Complaint  Patient presents with   Follow-up    Patient here today for a follow up on Gerd. Patient denies any current issues. He is taking pantoprazole  40 mg once per day.     HPI:   Cory Roberts is a 63 y.o. male who presents to clinic today for yearly  follow-up visit.  Has chronic reflux for which he takes pantoprazole  daily.  States his symptoms are well controlled as long as he takes his medication.  No dysphagia or odynophagia.  No melena hematochezia.  No epigastric or chest pain.  Last EGD 2017 showed a stricture as well as peptic duodenitis.  Biopsies negative for H. pylori.    Colonoscopy 2017 showed mild diverticulosis, no polyps. 10 year recall.  Patient denies any family history of colorectal malignancy. Denies any mlena or hematochezia.   Past Medical History:  Diagnosis Date   Arthritis 03/2011   Gout- Right foot   CVA (cerebral infarction)    GERD (gastroesophageal reflux disease)    Gout    Headache    Migraine Headaches   History of kidney stones    PUD (peptic ulcer disease) 2016   Raynaud's syndrome    Stroke [facility]) 2011    Past Surgical History:  Procedure Laterality Date   COLONOSCOPY N/A 03/22/2015   Procedure: COLONOSCOPY;  Surgeon: Margo LITTIE Haddock, MD;  [facility];  Service: Endoscopy;  Laterality: N/A;  1215   ESOPHAGOGASTRODUODENOSCOPY N/A 12/22/2014   Dr. Haddock: 1. dysphagia due to mid-esophageal web and uncontrolled reflux esophagitis. 2. small hiatal hernia 3. multiple small gastric and duodenal ulcers and mild duodenitis . Path with reactive gastropathy, negative H. pylori    ESOPHAGOGASTRODUODENOSCOPY N/A 03/22/2015   Procedure: ESOPHAGOGASTRODUODENOSCOPY (EGD);  Surgeon: Margo LITTIE Haddock, MD;  [facility];  Service: Endoscopy;  Laterality: N/A;   HERNIA REPAIR Left 2007   INCISION AND  DRAINAGE Right 12/18/2014   Procedure: INCISION AND DRAINAGE THUMB;  Surgeon: Taft FORBES Minerva, MD;  Location: [facility];  Service: Orthopedics;  Laterality: Right;   INCISION AND DRAINAGE Right 12/21/2014   Procedure: INCISION AND DRAINAGE RIGHT THUMB;  Surgeon: Taft FORBES Minerva, MD;  Location: [facility];  Service: Orthopedics;  Laterality: Right;   incision and drainage of thumb  12/18/14   ORIF FEMUR FRACTURE Right 10/28/2020   Procedure: OPEN REDUCTION INTERNAL FIXATION (ORIF) DISTAL FEMUR FRACTURE;  Surgeon: Fidel Rogue, MD;  [facility]: [facility];  Service: Orthopedics;  Laterality: Right;   TOTAL HIP ARTHROPLASTY Right 09/18/2016   TOTAL HIP ARTHROPLASTY Right 09/18/2016   Procedure: TOTAL HIP ARTHROPLASTY ANTERIOR APPROACH;  Surgeon: Fidel Rogue, MD;  Location: [facility];  Service: Orthopedics;  Laterality: Right;   TOTAL HIP ARTHROPLASTY Left 02/26/2017   Procedure: LEFT TOTAL HIP ARTHROPLASTY; ANTERIOR APPROACH;  Surgeon: Fidel Rogue, MD;  [facility]: [facility];  Service: Orthopedics;  Laterality: Left;    Current Outpatient Medications  Medication Sig Dispense Refill   albuterol  (VENTOLIN  HFA) 108 (90 Base) MCG/ACT inhaler Inhale 1-2 puffs into the lungs every 6 (six) hours as needed for wheezing or shortness of breath. 18 g 0   aspirin  81 MG chewable tablet Chew 1 tablet (81 mg total) by mouth daily. 30 tablet 1   Multiple Vitamin (MULTIVITAMIN ADULT) TABS 1 tablet Orally Once a day for 30 day(s)     pantoprazole  (PROTONIX )  40 MG tablet Take 1 tablet (40 mg total) by mouth daily before breakfast. 30 tablet 11   No current facility-administered medications for this visit.    Allergies as of 11/21/2023 - Review Complete 11/21/2023  Allergen Reaction Noted   Codeine Nausea And Vomiting 05/08/2011   Hydrocodone  Nausea Only 10/28/2011    Family History  Problem Relation Age of Onset   Cancer Mother    Hyperlipidemia Mother    Hypertension Mother    Heart attack Mother     Rheumatologic disease Mother    Cancer Father    Heart disease Father 64       Heart Disease before age 91   Heart attack Father    Diabetes Sister    Cancer Sister    Hyperlipidemia Sister    Hypertension Sister    Colon cancer Neg Hx     Social History   Socioeconomic History   Marital status: Married    Spouse name: Lorne Winkels   Number of children: 2   Years of education: 12   Highest education level: 12th grade  Occupational History   Not on file  Tobacco Use   Smoking status: Former    Current packs/day: 0.00    Average packs/day: 3.0 packs/day for 30.0 years (90.0 ttl pk-yrs)    Types: Cigarettes    Start date: 01/31/1975    Quit date: 01/30/2005    Years since quitting: 18.8    Passive exposure: Past   Smokeless tobacco: Never  Vaping Use   Vaping status: Never Used  Substance and Sexual Activity   Alcohol use: Not Currently   Drug use: No   Sexual activity: Not Currently  Other Topics Concern   Not on file  Social History Narrative   Not on file   Social Drivers of Health   Financial Resource Strain: Low Risk  (08/23/2021)   Overall Financial Resource Strain (CARDIA)    Difficulty of Paying Living Expenses: Not hard at all  Food Insecurity: No Food Insecurity (08/23/2021)   Hunger Vital Sign    Worried About Running Out of Food in the Last Year: Never true    Ran Out of Food in the Last Year: Never true  Transportation Needs: No Transportation Needs (08/23/2021)   PRAPARE - Administrator, Civil Service (Medical): No    Lack of Transportation (Non-Medical): No  Physical Activity: Insufficiently Active (08/23/2021)   Exercise Vital Sign    Days of Exercise per Week: 3 days    Minutes of Exercise per Session: 30 min  Stress: No Stress Concern Present (08/23/2021)   [employer] of Occupational Health - Occupational Stress Questionnaire    Feeling of Stress : Not at all  Social Connections: Moderately Integrated (08/23/2021)   Social  Connection and Isolation Panel    Frequency of Communication with Friends and Family: More than three times a week    Frequency of Social Gatherings with Friends and Family: More than three times a week    Attends Religious Services: More than 4 times per year    Active Member of [employer] or Organizations: No    Attends Banker Meetings: Never    Marital Status: Married    Subjective: Review of Systems  Constitutional:  Negative for chills and fever.  HENT:  Negative for congestion and hearing loss.   Eyes:  Negative for blurred vision and double vision.  Respiratory:  Negative for cough and shortness of breath.   Cardiovascular:  Negative for chest pain and palpitations.  Gastrointestinal:  Negative for abdominal pain, blood in stool, constipation, diarrhea, heartburn, melena and vomiting.  Genitourinary:  Negative for dysuria and urgency.  Musculoskeletal:  Negative for joint pain and myalgias.  Skin:  Negative for itching and rash.  Neurological:  Negative for dizziness and headaches.  Psychiatric/Behavioral:  Negative for depression. The patient is not nervous/anxious.      Objective: BP (!) 150/89 (BP Location: Left Arm, Patient Position: Sitting, Cuff Size: Normal)   Pulse 62   Temp (!) 97.5 F (36.4 C) (Temporal)   Ht 5' 8 (1.727 m)   Wt 194 lb 1.6 oz (88 kg)   BMI 29.51 kg/m  Physical Exam Constitutional:      Appearance: Normal appearance.  HENT:     Head: Normocephalic and atraumatic.  Eyes:     Extraocular Movements: Extraocular movements intact.     Conjunctiva/sclera: Conjunctivae normal.  Cardiovascular:     Rate and Rhythm: Normal rate and regular rhythm.  Pulmonary:     Effort: Pulmonary effort is normal.     Breath sounds: Normal breath sounds.  Abdominal:     General: Bowel sounds are normal.     Palpations: Abdomen is soft.  Musculoskeletal:        General: Normal range of motion.     Cervical back: Normal range of motion and neck  supple.  Skin:    General: Skin is warm.  Neurological:     General: No focal deficit present.     Mental Status: He is alert and oriented to person, place, and time.  Psychiatric:        Mood and Affect: Mood normal.        Behavior: Behavior normal.      Assessment: *GERD-well-controlled on pantoprazole  daily *Colon cancer screening  Plan: Patient's GERD well-controlled on pantoprazole  daily.  We will send in year supply of refills today.    Colonoscopy recall 2027.  Patient follow-up in 1 year or sooner if needed  11/21/2023 10:17 AM   Disclaimer: This note was dictated with voice recognition software. Similar sounding words can inadvertently be transcribed and may not be corrected upon review.

## 2023-11-21 NOTE — Patient Instructions (Signed)
 I am happy to hear that you are doing well.  I refilled your pantoprazole  today and sent in a year supply of refills.  You will be due for colonoscopy 2027.  Follow-up in 1 year or sooner if needed.  It was very nice seeing you again today.  Dr. Cindie

## 2024-02-11 ENCOUNTER — Emergency Department (HOSPITAL_COMMUNITY)

## 2024-02-11 ENCOUNTER — Observation Stay (HOSPITAL_COMMUNITY)
Admission: EM | Admit: 2024-02-11 | Discharge: 2024-02-12 | Disposition: A | Source: Ambulatory Visit | Attending: Family Medicine | Admitting: Family Medicine

## 2024-02-11 ENCOUNTER — Ambulatory Visit
Admission: EM | Admit: 2024-02-11 | Discharge: 2024-02-11 | Disposition: A | Discharge status: Home / Self Care | Attending: Nurse Practitioner | Admitting: Nurse Practitioner

## 2024-02-11 ENCOUNTER — Encounter (HOSPITAL_COMMUNITY): Payer: Self-pay

## 2024-02-11 DIAGNOSIS — K219 Gastro-esophageal reflux disease without esophagitis: Secondary | ICD-10-CM | POA: Diagnosis not present

## 2024-02-11 DIAGNOSIS — J40 Bronchitis, not specified as acute or chronic: Secondary | ICD-10-CM | POA: Diagnosis not present

## 2024-02-11 DIAGNOSIS — J441 Chronic obstructive pulmonary disease with (acute) exacerbation: Secondary | ICD-10-CM | POA: Diagnosis not present

## 2024-02-11 DIAGNOSIS — J439 Emphysema, unspecified: Secondary | ICD-10-CM | POA: Diagnosis not present

## 2024-02-11 DIAGNOSIS — I7300 Raynaud's syndrome without gangrene: Secondary | ICD-10-CM | POA: Diagnosis not present

## 2024-02-11 DIAGNOSIS — J101 Influenza due to other identified influenza virus with other respiratory manifestations: Secondary | ICD-10-CM | POA: Insufficient documentation

## 2024-02-11 DIAGNOSIS — K279 Peptic ulcer, site unspecified, unspecified as acute or chronic, without hemorrhage or perforation: Secondary | ICD-10-CM | POA: Diagnosis not present

## 2024-02-11 DIAGNOSIS — R059 Cough, unspecified: Secondary | ICD-10-CM

## 2024-02-11 DIAGNOSIS — Z87891 Personal history of nicotine dependence: Secondary | ICD-10-CM | POA: Insufficient documentation

## 2024-02-11 DIAGNOSIS — R1032 Left lower quadrant pain: Secondary | ICD-10-CM | POA: Diagnosis not present

## 2024-02-11 DIAGNOSIS — J9601 Acute respiratory failure with hypoxia: Secondary | ICD-10-CM | POA: Diagnosis not present

## 2024-02-11 DIAGNOSIS — R911 Solitary pulmonary nodule: Secondary | ICD-10-CM | POA: Insufficient documentation

## 2024-02-11 DIAGNOSIS — R112 Nausea with vomiting, unspecified: Secondary | ICD-10-CM

## 2024-02-11 LAB — COMPREHENSIVE METABOLIC PANEL WITH GFR
ALT: 17 U/L (ref 0–44)
AST: 22 U/L (ref 15–41)
Albumin: 4 g/dL (ref 3.5–5.0)
Alkaline Phosphatase: 176 U/L — ABNORMAL HIGH (ref 38–126)
Anion gap: 17 — ABNORMAL HIGH (ref 5–15)
BUN: 18 mg/dL (ref 8–23)
CO2: 23 mmol/L (ref 22–32)
Calcium: 9.3 mg/dL (ref 8.9–10.3)
Chloride: 97 mmol/L — ABNORMAL LOW (ref 98–111)
Creatinine, Ser: 1.19 mg/dL (ref 0.61–1.24)
GFR, Estimated: >60 mL/min
Glucose, Bld: 126 mg/dL — ABNORMAL HIGH (ref 70–99)
Potassium: 3.8 mmol/L (ref 3.5–5.1)
Sodium: 137 mmol/L (ref 135–145)
Total Bilirubin: 1.1 mg/dL (ref 0.0–1.2)
Total Protein: 7 g/dL (ref 6.5–8.1)

## 2024-02-11 LAB — RESP PANEL BY RT-PCR (RSV, FLU A&B, COVID)  RVPGX2
Influenza A by PCR: POSITIVE — AB
Influenza B by PCR: NEGATIVE
Resp Syncytial Virus by PCR: NEGATIVE
SARS Coronavirus 2 by RT PCR: NEGATIVE

## 2024-02-11 LAB — CBC WITH DIFFERENTIAL/PLATELET
Abs Immature Granulocytes: 0.03 K/uL (ref 0.00–0.07)
Basophils Absolute: 0 K/uL (ref 0.0–0.1)
Basophils Relative: 0 %
Eosinophils Absolute: 0.3 K/uL (ref 0.0–0.5)
Eosinophils Relative: 3 %
HCT: 44.3 % (ref 39.0–52.0)
Hemoglobin: 15.3 g/dL (ref 13.0–17.0)
Immature Granulocytes: 0 %
Lymphocytes Relative: 14 %
Lymphs Abs: 1.2 K/uL (ref 0.7–4.0)
MCH: 32.8 pg (ref 26.0–34.0)
MCHC: 34.5 g/dL (ref 30.0–36.0)
MCV: 94.9 fL (ref 80.0–100.0)
Monocytes Absolute: 1.2 K/uL — ABNORMAL HIGH (ref 0.1–1.0)
Monocytes Relative: 14 %
Neutro Abs: 5.9 K/uL (ref 1.7–7.7)
Neutrophils Relative %: 69 %
Platelets: 167 K/uL (ref 150–400)
RBC: 4.67 MIL/uL (ref 4.22–5.81)
RDW: 12.3 % (ref 11.5–15.5)
WBC: 8.7 K/uL (ref 4.0–10.5)
nRBC: 0 % (ref 0.0–0.2)

## 2024-02-11 LAB — URINALYSIS, ROUTINE W REFLEX MICROSCOPIC
Bilirubin Urine: NEGATIVE
Glucose, UA: NEGATIVE mg/dL
Hgb urine dipstick: NEGATIVE
Ketones, ur: 20 mg/dL — AB
Leukocytes,Ua: NEGATIVE
Nitrite: NEGATIVE
Protein, ur: NEGATIVE mg/dL
Specific Gravity, Urine: 1.04 — ABNORMAL HIGH (ref 1.005–1.030)
pH: 6 (ref 5.0–8.0)

## 2024-02-11 LAB — POC SOFIA SARS ANTIGEN FIA: SARS Coronavirus 2 Ag: NEGATIVE

## 2024-02-11 LAB — D-DIMER, QUANTITATIVE: D-Dimer, Quant: 1.1 ug{FEU}/mL — ABNORMAL HIGH (ref 0.00–0.50)

## 2024-02-11 LAB — LIPASE, BLOOD: Lipase: 17 U/L (ref 11–51)

## 2024-02-11 LAB — POCT INFLUENZA A/B
Influenza A, POC: NEGATIVE
Influenza B, POC: NEGATIVE

## 2024-02-11 LAB — TROPONIN T, HIGH SENSITIVITY
Troponin T High Sensitivity: 16 ng/L (ref 0–19)
Troponin T High Sensitivity: <15 ng/L (ref 0–19)

## 2024-02-11 MED ORDER — ACETAMINOPHEN 325 MG PO TABS: 650.0000 mg | ORAL_TABLET | Freq: Four times a day (QID) | ORAL | Status: DC | PRN

## 2024-02-11 MED ORDER — METHYLPREDNISOLONE SODIUM SUCC 125 MG IJ SOLR
125.0000 mg | Freq: Once | INTRAMUSCULAR | Status: AC
  Administered 2024-02-11: 125 mg via INTRAVENOUS
  Filled 2024-02-11: qty 2

## 2024-02-11 MED ORDER — IPRATROPIUM-ALBUTEROL 0.5-2.5 (3) MG/3ML IN SOLN
3.0000 mL | Freq: Once | RESPIRATORY_TRACT | Status: AC
  Administered 2024-02-11: 3 mL via RESPIRATORY_TRACT
  Filled 2024-02-11: qty 3

## 2024-02-11 MED ORDER — PANTOPRAZOLE SODIUM 40 MG PO TBEC
40.0000 mg | DELAYED_RELEASE_TABLET | Freq: Every day | ORAL | Status: DC
  Administered 2024-02-12: 40 mg via ORAL
  Filled 2024-02-11: qty 1

## 2024-02-11 MED ORDER — ONDANSETRON HCL 4 MG/2ML IJ SOLN
4.0000 mg | Freq: Once | INTRAMUSCULAR | Status: AC
  Administered 2024-02-11: 4 mg via INTRAVENOUS
  Filled 2024-02-11: qty 2

## 2024-02-11 MED ORDER — ONDANSETRON HCL 4 MG/2ML IJ SOLN: 4.0000 mg | Freq: Four times a day (QID) | INTRAMUSCULAR | Status: DC | PRN

## 2024-02-11 MED ORDER — DOXYCYCLINE HYCLATE 100 MG PO TABS
100.0000 mg | ORAL_TABLET | Freq: Once | ORAL | Status: AC
  Administered 2024-02-11: 100 mg via ORAL
  Filled 2024-02-11: qty 1

## 2024-02-11 MED ORDER — ALBUTEROL SULFATE (2.5 MG/3ML) 0.083% IN NEBU
10.0000 mg/h | INHALATION_SOLUTION | Freq: Once | RESPIRATORY_TRACT | Status: AC
  Administered 2024-02-11: 10 mg/h via RESPIRATORY_TRACT
  Filled 2024-02-11: qty 12

## 2024-02-11 MED ORDER — ORAL CARE MOUTH RINSE: 15.0000 mL | OROMUCOSAL | Status: DC | PRN

## 2024-02-11 MED ORDER — INFLUENZA VAC SPLIT HIGH-DOSE 0.5 ML IM SUSY
0.5000 mL | PREFILLED_SYRINGE | INTRAMUSCULAR | Status: DC
  Filled 2024-02-11: qty 0.5

## 2024-02-11 MED ORDER — ENOXAPARIN SODIUM 40 MG/0.4ML IJ SOSY
40.0000 mg | PREFILLED_SYRINGE | INTRAMUSCULAR | Status: DC
  Administered 2024-02-11: 40 mg via SUBCUTANEOUS
  Filled 2024-02-11: qty 0.4

## 2024-02-11 MED ORDER — SODIUM CHLORIDE 0.9 % IV BOLUS
1000.0000 mL | Freq: Once | INTRAVENOUS | Status: AC
  Administered 2024-02-11: 1000 mL via INTRAVENOUS

## 2024-02-11 MED ORDER — OSELTAMIVIR PHOSPHATE 75 MG PO CAPS
75.0000 mg | ORAL_CAPSULE | Freq: Once | ORAL | Status: AC
  Administered 2024-02-11: 75 mg via ORAL
  Filled 2024-02-11: qty 1

## 2024-02-11 MED ORDER — PNEUMOCOCCAL 20-VAL CONJ VACC 0.5 ML IM SUSY
0.5000 mL | PREFILLED_SYRINGE | INTRAMUSCULAR | Status: DC
  Filled 2024-02-11: qty 0.5

## 2024-02-11 MED ORDER — OSELTAMIVIR PHOSPHATE 75 MG PO CAPS
75.0000 mg | ORAL_CAPSULE | Freq: Two times a day (BID) | ORAL | Status: DC
  Administered 2024-02-12: 75 mg via ORAL
  Filled 2024-02-11 (×2): qty 1

## 2024-02-11 MED ORDER — IPRATROPIUM-ALBUTEROL 0.5-2.5 (3) MG/3ML IN SOLN
3.0000 mL | RESPIRATORY_TRACT | Status: DC | PRN
  Administered 2024-02-12 (×2): 3 mL via RESPIRATORY_TRACT
  Filled 2024-02-11 (×2): qty 3

## 2024-02-11 MED ORDER — PREDNISONE 20 MG PO TABS
40.0000 mg | ORAL_TABLET | Freq: Every day | ORAL | Status: DC
  Administered 2024-02-12: 40 mg via ORAL
  Filled 2024-02-11: qty 2

## 2024-02-11 MED ORDER — IOHEXOL 350 MG/ML SOLN
75.0000 mL | Freq: Once | INTRAVENOUS | Status: AC | PRN
  Administered 2024-02-11: 75 mL via INTRAVENOUS

## 2024-02-11 NOTE — ED Notes (Signed)
 Patient transported to CT

## 2024-02-11 NOTE — ED Triage Notes (Signed)
 Pt c/o cough, congestion, and n/v/d x4 days.  Pt reports his wife recently tested positive for the flu.  Pt was sent by UC.  Sts he tested negative for the flu and covid.

## 2024-02-11 NOTE — Plan of Care (Signed)

## 2024-02-11 NOTE — ED Notes (Signed)
 Patient transported to X-ray

## 2024-02-11 NOTE — Discharge Instructions (Addendum)
 Patient discharged to the emergency department for further evaluation.

## 2024-02-11 NOTE — ED Triage Notes (Signed)
 Pt reports cough, congestion, headache chest discomfort, body aches,  wheezing, fever, nausea vomiting and diarrhea x 4 days.

## 2024-02-11 NOTE — ED Provider Notes (Signed)
" [facility]    CSN: [id number] Arrival date & time: 02/11/24  1143      History   Chief Complaint No chief complaint on file.   HPI Cory Roberts is a 64 y.o. male.   The history is provided by the patient.   Patient presents with a 4-day history of fever, chills, body aches, nasal congestion, runny nose, cough, abdominal pain, nausea, vomiting, and diarrhea.  Tmax 104 last night.  Patient reports at least 5 episodes of nausea, vomiting, diarrhea daily over the past 4 days.  He reports 3 episodes of each today.  States that he is unable to keep any foods or liquids down, and when he eats something,, it goes right through.  Patient complains of pain in the left lower abdomen.  States I cannot sleep on my left side due to the pain.  Patient reports underlying history of peptic ulcer disease.  States he has been taking over-the-counter medications for his symptoms with minimal relief.  Denies ear pain, ear drainage, lightheadedness, dizziness, bloody stools, gas, bloating, rash, or constipation.  Past Medical History:  Diagnosis Date   Arthritis 03/2011   Gout- Right foot   CVA (cerebral infarction)    GERD (gastroesophageal reflux disease)    Gout    Headache    Migraine Headaches   History of kidney stones    PUD (peptic ulcer disease) 2016   Raynaud's syndrome    Stroke [facility]) 2011    Patient Active Problem List   Diagnosis Date Noted   Acute ischemic stroke (HCC) 08/03/2021   Stroke (cerebrum) (HCC) 08/03/2021   Stress fracture of shaft of right femur 10/28/2020   Fracture, stress, femur, shaft, right, initial encounter 10/28/2020   Aortic atherosclerosis 09/17/2019   Diverticulosis 09/17/2019   GERD (gastroesophageal reflux disease) 10/14/2018   Displaced fracture of left femoral neck (HCC) 02/26/2017   Closed left hip fracture, initial encounter (HCC) 02/23/2017   Mild renal insufficiency 02/23/2017   Avascular necrosis of hip, right (HCC) 09/18/2016    Encounter for screening colonoscopy 03/08/2015   PUD (peptic ulcer disease)    Dysphagia    Abscess of right thumb 12/18/2014   Abscess of thumb 12/15/2014   Cellulitis and abscess 12/15/2014   Obesity 10/31/2011   Dyslipidemia 10/31/2011   At risk for diabetes mellitus 10/31/2011   Migraine with aura 10/29/2011   History of CVA in adulthood 10/28/2011   Vertigo 10/28/2011   Pain in limb 05/15/2011    Past Surgical History:  Procedure Laterality Date   COLONOSCOPY N/A 03/22/2015   Procedure: COLONOSCOPY;  Surgeon: Margo LITTIE Haddock, MD;  Location: [facility];  Service: Endoscopy;  Laterality: N/A;  1215   ESOPHAGOGASTRODUODENOSCOPY N/A 12/22/2014   Dr. Haddock: 1. dysphagia due to mid-esophageal web and uncontrolled reflux esophagitis. 2. small hiatal hernia 3. multiple small gastric and duodenal ulcers and mild duodenitis . Path with reactive gastropathy, negative H. pylori    ESOPHAGOGASTRODUODENOSCOPY N/A 03/22/2015   Procedure: ESOPHAGOGASTRODUODENOSCOPY (EGD);  Surgeon: Margo LITTIE Haddock, MD;  Location: [facility];  Service: Endoscopy;  Laterality: N/A;   HERNIA REPAIR Left 2007   INCISION AND DRAINAGE Right 12/18/2014   Procedure: INCISION AND DRAINAGE THUMB;  Surgeon: Taft FORBES Minerva, MD;  Location: [facility];  Service: Orthopedics;  Laterality: Right;   INCISION AND DRAINAGE Right 12/21/2014   Procedure: INCISION AND DRAINAGE RIGHT THUMB;  Surgeon: Taft FORBES Minerva, MD;  Location: [facility];  Service: Orthopedics;  Laterality:  Right;   incision and drainage of thumb  12/18/14   ORIF FEMUR FRACTURE Right 10/28/2020   Procedure: OPEN REDUCTION INTERNAL FIXATION (ORIF) DISTAL FEMUR FRACTURE;  Surgeon: Fidel Rogue, MD;  Location: [facility];  Service: Orthopedics;  Laterality: Right;   TOTAL HIP ARTHROPLASTY Right 09/18/2016   TOTAL HIP ARTHROPLASTY Right 09/18/2016   Procedure: TOTAL HIP ARTHROPLASTY ANTERIOR APPROACH;  Surgeon: Fidel Rogue, MD;  Location: [facility];  Service:  Orthopedics;  Laterality: Right;   TOTAL HIP ARTHROPLASTY Left 02/26/2017   Procedure: LEFT TOTAL HIP ARTHROPLASTY; ANTERIOR APPROACH;  Surgeon: Fidel Rogue, MD;  Location: [facility];  Service: Orthopedics;  Laterality: Left;       Home Medications    Prior to Admission medications  Medication Sig Start Date End Date Taking? Authorizing Provider  pantoprazole  (PROTONIX ) 40 MG tablet Take 1 tablet (40 mg total) by mouth daily before breakfast. 11/21/23   Cindie Carlin POUR, DO    Family History Family History  Problem Relation Age of Onset   Cancer Mother    Hyperlipidemia Mother    Hypertension Mother    Heart attack Mother    Rheumatologic disease Mother    Cancer Father    Heart disease Father 55       Heart Disease before age 99   Heart attack Father    Diabetes Sister    Cancer Sister    Hyperlipidemia Sister    Hypertension Sister    Colon cancer Neg Hx     Social History Social History[1]   Allergies   Codeine, Simvastatin , Colchicine, Hydrocodone , and Pholcodine   Review of Systems Review of Systems Per HPI  Physical Exam Triage Vital Signs ED Triage Vitals  Encounter Vitals Group     BP 02/11/24 1153 138/85     Girls Systolic BP Percentile --      Girls Diastolic BP Percentile --      Boys Systolic BP Percentile --      Boys Diastolic BP Percentile --      Pulse Rate 02/11/24 1153 97     Resp 02/11/24 1153 (!) 22     Temp 02/11/24 1153 98.8 F (37.1 C)     Temp Source 02/11/24 1153 Oral     SpO2 02/11/24 1153 93 %     Weight --      Height --      Head Circumference --      Peak Flow --      Pain Score 02/11/24 1154 8     Pain Loc --      Pain Education --      Exclude from Growth Chart --    No data found.  Updated Vital Signs BP 138/85 (BP Location: Right Arm)   Pulse 97   Temp 98.8 F (37.1 C) (Oral)   Resp (!) 22   SpO2 93%   Visual Acuity Right Eye Distance:   Left Eye Distance:   Bilateral Distance:    Right Eye Near:    Left Eye Near:    Bilateral Near:     Physical Exam Vitals and nursing note reviewed.  Constitutional:      Appearance: Normal appearance. He is ill-appearing.  HENT:     Head: Normocephalic.     Right Ear: Tympanic membrane, ear canal and external ear normal.     Left Ear: Tympanic membrane, ear canal and external ear normal.     Nose: Congestion present.     Mouth/Throat:  Mouth: Mucous membranes are moist.  Eyes:     Extraocular Movements: Extraocular movements intact.     Conjunctiva/sclera: Conjunctivae normal.     Pupils: Pupils are equal, round, and reactive to light.  Cardiovascular:     Rate and Rhythm: Normal rate and regular rhythm.     Pulses: Normal pulses.     Heart sounds: Normal heart sounds.  Pulmonary:     Effort: Pulmonary effort is normal.     Breath sounds: Normal breath sounds.  Abdominal:     General: Bowel sounds are normal.     Palpations: Abdomen is soft.     Tenderness: There is abdominal tenderness in the left lower quadrant.  Musculoskeletal:     Cervical back: Normal range of motion.  Skin:    General: Skin is warm and dry.  Neurological:     General: No focal deficit present.     Mental Status: He is alert and oriented to person, place, and time.  Psychiatric:        Mood and Affect: Mood normal.        Behavior: Behavior normal.      UC Treatments / Results  Labs (all labs ordered are listed, but only abnormal results are displayed) Labs Reviewed  POCT INFLUENZA A/B - Normal  POC SOFIA SARS ANTIGEN FIA  POCT URINE DIPSTICK    EKG   Radiology No results found.  Procedures Procedures (including critical care time)  Medications Ordered in UC Medications - No data to display  Initial Impression / Assessment and Plan / UC Course  I have reviewed the triage vital signs and the nursing notes.  Pertinent labs & imaging results that were available during my care of the patient were reviewed by me and considered in my  medical decision making (see chart for details).  Patient presents with a 4-day history of viral illness along with left lower quadrant abdominal pain and tenderness.  On exam, he does have tenderness to the left lower quadrant.  He also endorses a 4-day history of nausea, vomiting, and diarrhea.  States he is unable to keep any foods or liquids down.  Influenza test and COVID test were negative.  Given his current symptoms, recommend follow-up in the emergency department for further evaluation.  Discussed this recommendation with the patient and he was in agreement with.  Patient's vital signs are stable at this time, he is able to travel via private vehicle.  Patient discharged to the emergency department.  Patient ambulatory at discharge.   Final Clinical Impressions(s) / UC Diagnoses   Final diagnoses:  Nausea and vomiting, unspecified vomiting type  Abdominal pain, left lower quadrant  Cough, unspecified type     Discharge Instructions      Patient discharged to the emergency department for further evaluation.     ED Prescriptions   None    PDMP not reviewed this encounter.     [1]  Social History Tobacco Use   Smoking status: Former    Current packs/day: 0.00    Average packs/day: 3.0 packs/day for 30.0 years (90.0 ttl pk-yrs)    Types: Cigarettes    Start date: 01/31/1975    Quit date: 01/30/2005    Years since quitting: 19.0    Passive exposure: Past   Smokeless tobacco: Never  Vaping Use   Vaping status: Never Used  Substance Use Topics   Alcohol use: Not Currently   Drug use: No     Leath-Warren, Etta PARAS, NP  02/11/24 1301 ° °"

## 2024-02-11 NOTE — ED Notes (Signed)
 Transport notified to transport pt to [facility].

## 2024-02-11 NOTE — ED Provider Notes (Signed)
 " [facility] MEDICAL SURGICAL UNIT Provider Note  CSN: [id number] Arrival date & time: 02/11/24 1317  Chief Complaint(s) Cough, Emesis, and Diarrhea  HPI Cory Roberts is a 64 y.o. male history of GERD presenting to the emergency department with cough.  Patient reports associated shortness of breath.  Reports the cough is productive.  Also having some congestion, nausea vomiting and diarrhea.  No abdominal pain but reports some chest pain with taking a deep breath and coughing in the left lower chest.  Wife was sick with the flu.  Reports some fevers and chills.  Went to urgent care but flu test was negative and based off his symptoms was sent to the ER.   Past Medical History Past Medical History:  Diagnosis Date   Arthritis 03/2011   Gout- Right foot   CVA (cerebral infarction)    GERD (gastroesophageal reflux disease)    Gout    Headache    Migraine Headaches   History of kidney stones    PUD (peptic ulcer disease) 2016   Raynaud's syndrome    Stroke [facility]) 2011   Patient Active Problem List   Diagnosis Date Noted   COPD exacerbation (HCC) 02/11/2024   Acute ischemic stroke (HCC) 08/03/2021   Stroke (cerebrum) (HCC) 08/03/2021   Stress fracture of shaft of right femur 10/28/2020   Fracture, stress, femur, shaft, right, initial encounter 10/28/2020   Aortic atherosclerosis 09/17/2019   Diverticulosis 09/17/2019   GERD (gastroesophageal reflux disease) 10/14/2018   Displaced fracture of left femoral neck (HCC) 02/26/2017   Closed left hip fracture, initial encounter (HCC) 02/23/2017   Mild renal insufficiency 02/23/2017   Avascular necrosis of hip, right (HCC) 09/18/2016   Encounter for screening colonoscopy 03/08/2015   PUD (peptic ulcer disease)    Dysphagia    Abscess of right thumb 12/18/2014   Abscess of thumb 12/15/2014   Cellulitis and abscess 12/15/2014   Obesity 10/31/2011   Dyslipidemia 10/31/2011   At risk for diabetes mellitus 10/31/2011   Migraine with  aura 10/29/2011   History of CVA in adulthood 10/28/2011   Vertigo 10/28/2011   Pain in limb 05/15/2011   Home Medication(s) Prior to Admission medications  Medication Sig Start Date End Date Taking? Authorizing Provider  pantoprazole  (PROTONIX ) 40 MG tablet Take 1 tablet (40 mg total) by mouth daily before breakfast. 11/21/23  Yes Cindie Carlin POUR, DO                                                                                                                                    Past Surgical History Past Surgical History:  Procedure Laterality Date   COLONOSCOPY N/A 03/22/2015   Procedure: COLONOSCOPY;  Surgeon: Margo LITTIE Haddock, MD;  Location: [facility];  Service: Endoscopy;  Laterality: N/A;  1215   ESOPHAGOGASTRODUODENOSCOPY N/A 12/22/2014   Dr. Haddock: 1. dysphagia due to mid-esophageal web and uncontrolled reflux esophagitis. 2. small  hiatal hernia 3. multiple small gastric and duodenal ulcers and mild duodenitis . Path with reactive gastropathy, negative H. pylori    ESOPHAGOGASTRODUODENOSCOPY N/A 03/22/2015   Procedure: ESOPHAGOGASTRODUODENOSCOPY (EGD);  Surgeon: Margo LITTIE Haddock, MD;  Location: [facility];  Service: Endoscopy;  Laterality: N/A;   HERNIA REPAIR Left 2007   INCISION AND DRAINAGE Right 12/18/2014   Procedure: INCISION AND DRAINAGE THUMB;  Surgeon: Taft FORBES Minerva, MD;  Location: [facility] ORS;  Service: Orthopedics;  Laterality: Right;   INCISION AND DRAINAGE Right 12/21/2014   Procedure: INCISION AND DRAINAGE RIGHT THUMB;  Surgeon: Taft FORBES Minerva, MD;  Location: [facility] ORS;  Service: Orthopedics;  Laterality: Right;   incision and drainage of thumb  12/18/14   ORIF FEMUR FRACTURE Right 10/28/2020   Procedure: OPEN REDUCTION INTERNAL FIXATION (ORIF) DISTAL FEMUR FRACTURE;  Surgeon: Fidel Rogue, MD;  Location: [facility] ORS;  Service: Orthopedics;  Laterality: Right;   TOTAL HIP ARTHROPLASTY Right 09/18/2016   TOTAL HIP ARTHROPLASTY Right 09/18/2016   Procedure: TOTAL  HIP ARTHROPLASTY ANTERIOR APPROACH;  Surgeon: Fidel Rogue, MD;  Location: [facility] OR;  Service: Orthopedics;  Laterality: Right;   TOTAL HIP ARTHROPLASTY Left 02/26/2017   Procedure: LEFT TOTAL HIP ARTHROPLASTY; ANTERIOR APPROACH;  Surgeon: Fidel Rogue, MD;  Location: [facility] OR;  Service: Orthopedics;  Laterality: Left;   Family History Family History  Problem Relation Age of Onset   Cancer Mother    Hyperlipidemia Mother    Hypertension Mother    Heart attack Mother    Rheumatologic disease Mother    Cancer Father    Heart disease Father 47       Heart Disease before age 67   Heart attack Father    Diabetes Sister    Cancer Sister    Hyperlipidemia Sister    Hypertension Sister    Colon cancer Neg Hx     Social History Social History[1] Allergies Codeine, Simvastatin , Colchicine, Hydrocodone , and Pholcodine  Review of Systems Review of Systems  All other systems reviewed and are negative.   Physical Exam Vital Signs  I have reviewed the triage vital signs BP (!) 149/73 (BP Location: Left Arm)   Pulse (!) 102   Temp 98.2 F (36.8 C) (Oral)   Resp 20   Ht 5' 8 (1.727 m)   Wt 83.9 kg   SpO2 98%   BMI 28.13 kg/m  Physical Exam Vitals and nursing note reviewed.  Constitutional:      General: He is not in acute distress.    Appearance: Normal appearance.  HENT:     Mouth/Throat:     Mouth: Mucous membranes are dry.  Eyes:     Conjunctiva/sclera: Conjunctivae normal.  Cardiovascular:     Rate and Rhythm: Regular rhythm. Tachycardia present.  Pulmonary:     Effort: Tachypnea, accessory muscle usage and prolonged expiration present. No respiratory distress.     Breath sounds: Wheezing (diffuse) present.  Abdominal:     General: Abdomen is flat.     Palpations: Abdomen is soft.     Tenderness: There is no abdominal tenderness.  Musculoskeletal:     Right lower leg: No edema.     Left lower leg: No edema.     Comments: Left lower chest with reproducible chest  wall tenderness  Skin:    General: Skin is warm and dry.     Capillary Refill: Capillary refill takes less than 2 seconds.  Neurological:     Mental Status: He is alert and oriented  to person, place, and time. Mental status is at baseline.  Psychiatric:        Mood and Affect: Mood normal.        Behavior: Behavior normal.     ED Results and Treatments Labs (all labs ordered are listed, but only abnormal results are displayed) Labs Reviewed  RESP PANEL BY RT-PCR (RSV, FLU A&B, COVID)  RVPGX2 - Abnormal; Notable for the following components:      Result Value   Influenza A by PCR POSITIVE (*)    All other components within normal limits  COMPREHENSIVE METABOLIC PANEL WITH GFR - Abnormal; Notable for the following components:   Chloride 97 (*)    Glucose, Bld 126 (*)    Alkaline Phosphatase 176 (*)    Anion gap 17 (*)    All other components within normal limits  CBC WITH DIFFERENTIAL/PLATELET - Abnormal; Notable for the following components:   Monocytes Absolute 1.2 (*)    All other components within normal limits  URINALYSIS, ROUTINE W REFLEX MICROSCOPIC - Abnormal; Notable for the following components:   Specific Gravity, Urine 1.040 (*)    Ketones, ur 20 (*)    All other components within normal limits  D-DIMER, QUANTITATIVE - Abnormal; Notable for the following components:   D-Dimer, Quant 1.10 (*)    All other components within normal limits  LIPASE, BLOOD  HIV ANTIBODY (ROUTINE TESTING W REFLEX)  BASIC METABOLIC PANEL WITH GFR  CBC  TROPONIN T, HIGH SENSITIVITY  TROPONIN T, HIGH SENSITIVITY                                                                                                                          Radiology CT Angio Chest PE W and/or Wo Contrast Result Date: 02/11/2024 CLINICAL DATA:  Wheezing chest discomfort cough congestion EXAM: CT ANGIOGRAPHY CHEST WITH CONTRAST TECHNIQUE: Multidetector CT imaging of the chest was performed using the standard  protocol during bolus administration of intravenous contrast. Multiplanar CT image reconstructions and MIPs were obtained to evaluate the vascular anatomy. RADIATION DOSE REDUCTION: This exam was performed according to the departmental dose-optimization program which includes automated exposure control, adjustment of the mA and/or kV according to patient size and/or use of iterative reconstruction technique. CONTRAST:  75mL OMNIPAQUE  IOHEXOL  350 MG/ML SOLN COMPARISON:  Chest x-ray 02/11/2024, chest CT 05/10/2017 FINDINGS: Cardiovascular: Satisfactory opacification of the pulmonary arteries to the segmental level. No evidence of pulmonary embolism. Mild atherosclerosis. No aneurysm or dissection. Coronary vascular calcification. Upper normal cardiac size. No pericardial effusion Mediastinum/Nodes: Patent trachea. No suspicious thyroid  mass. Mildly prominent lymph nodes, for example left paratracheal node measuring 13 mm, previously 14 mm. Subcarinal lymph node measuring 11 mm, previously 12 mm. Small hilar nodes. Esophagus within normal limits aside from small hiatal hernia Lungs/Pleura: Mild emphysema. No consolidation, pleural effusion or pneumothorax. Bilateral bronchial wall thickening. Minimal mucous plugging in the lower lobes. Ground-glass nodule at the left apex measuring 6 mm, series 11, image 26.  Mild subpleural reticulation. Upper Abdomen: No acute finding Musculoskeletal: No acute osseous abnormality Review of the MIP images confirms the above findings. IMPRESSION: 1. Negative for acute pulmonary embolus. 2. Emphysema with bilateral bronchial wall thickening and minimal mucous plugging in the lower lobes, suspect for airways inflammatory process. 3. 6 mm ground-glass nodule at the left apex. Initial follow-up with CT at 6 months is recommended to confirm persistence. If persistent, repeat CT is recommended every 2 years until 5 years of stability has been established. This recommendation follows the  consensus statement: Guidelines for Management of Incidental Pulmonary Nodules Detected on CT Images: From the Fleischner Society 2017; Radiology 2017; 284:228-243. 4. Aortic atherosclerosis. Aortic Atherosclerosis (ICD10-I70.0) and Emphysema (ICD10-J43.9). Electronically Signed   By: Luke Bun M.D.   On: 02/11/2024 16:44   DG Chest 2 View Result Date: 02/11/2024 EXAM: 2 VIEW(S) XRAY OF THE CHEST 02/11/2024 02:44:59 PM COMPARISON: None available. CLINICAL HISTORY: cough FINDINGS: LUNGS AND PLEURA: Chronic peripheral interstitial thickening. No focal pulmonary opacity. No pleural effusion. No pneumothorax. HEART AND MEDIASTINUM: No acute abnormality of the cardiac and mediastinal silhouettes. BONES AND SOFT TISSUES: No acute fracture or destructive lesion. Multilevel thoracic osteophytosis. IMPRESSION: 1. No acute findings. 2. Chronic peripheral interstitial thickening. Electronically signed by: Norleen Boxer MD MD 02/11/2024 03:46 PM EST RP Workstation: [id number]    Pertinent labs & imaging results that were available during my care of the patient were reviewed by me and considered in my medical decision making (see MDM for details).  Medications Ordered in ED Medications  enoxaparin  (LOVENOX ) injection 40 mg (has no administration in time range)  ipratropium-albuterol  (DUONEB) 0.5-2.5 (3) MG/3ML nebulizer solution 3 mL (has no administration in time range)  predniSONE  (DELTASONE ) tablet 40 mg (has no administration in time range)  oseltamivir  (TAMIFLU ) capsule 75 mg (has no administration in time range)  sodium chloride  0.9 % bolus 1,000 mL (0 mLs Intravenous Stopped 02/11/24 1534)  ondansetron  (ZOFRAN ) injection 4 mg (4 mg Intravenous Given 02/11/24 1407)  ipratropium-albuterol  (DUONEB) 0.5-2.5 (3) MG/3ML nebulizer solution 3 mL (3 mLs Nebulization Given 02/11/24 1415)  methylPREDNISolone  sodium succinate (SOLU-MEDROL ) 125 mg/2 mL injection 125 mg (125 mg Intravenous Given 02/11/24 1415)  iohexol   (OMNIPAQUE ) 350 MG/ML injection 75 mL (75 mLs Intravenous Contrast Given 02/11/24 1522)  ipratropium-albuterol  (DUONEB) 0.5-2.5 (3) MG/3ML nebulizer solution 3 mL (3 mLs Nebulization Given 02/11/24 1712)  albuterol  (PROVENTIL ) (2.5 MG/3ML) 0.083% nebulizer solution (10 mg/hr Nebulization Given 02/11/24 1754)  oseltamivir  (TAMIFLU ) capsule 75 mg (75 mg Oral Given 02/11/24 1800)  doxycycline  (VIBRA -TABS) tablet 100 mg (100 mg Oral Given 02/11/24 1800)                                                                                                                                     Procedures .Critical Care  Performed by: Francesca Elsie CROME, MD Authorized by: Francesca Elsie CROME, MD   Critical care provider statement:  Critical care time (minutes):  30   Critical care was necessary to treat or prevent imminent or life-threatening deterioration of the following conditions:  Respiratory failure   Critical care was time spent personally by me on the following activities:  Development of treatment plan with patient or surrogate, discussions with consultants, evaluation of patient's response to treatment, examination of patient, ordering and review of laboratory studies, ordering and review of radiographic studies, ordering and performing treatments and interventions, pulse oximetry, re-evaluation of patient's condition and review of old charts   Care discussed with: admitting provider     (including critical care time)  Medical Decision Making / ED Course   MDM:  64 year old presenting to the emergency department with nausea vomiting diarrhea, cough, chest pain.  Patient no acute distress, does have some mild tachypnea with expiratory wheezing.  No formal COPD diagnosis presents, did smoke previously.  Symptoms are suspicious for URI.  His COVID and flu testing point-of-care testing was negative at urgent care but will obtain PCR testing.  Also obtain chest x-ray to evaluate for underlying  pneumonia.  Does report some chest pain, seems most consistent with musculoskeletal pain in setting of cough but given pleuritic nature and shortness of breath will check D-dimer, troponin.  He reports some nausea, vomiting and diarrhea without abdominal pain or tenderness to suggest any acute intra-abdominal process such as perforation or obstruction., likely due to underlying condition, will give some fluids and Zofran .  Given wheezing will give dose of steroids and DuoNeb.  Will reassess.  Clinical Course as of 02/11/24 1853  Mon Feb 11, 2024  1659 CT scan shows emphysema, inflammation.  Suspect component of COPD not previously diagnosed.  Reports improvement with DuoNeb but still having persistent shortness of breath.  Will give additional neb treatment. [WS]  1851 Patient had persistent ongoing shortness of breath.  Discussed with patient regarding admission versus discharge, patient okay with admission.  He does have tachypnea and shortness of breath even at rest.  Discussed with the hospitalist who will admit the patient. [WS]    Clinical Course User Index [WS] Francesca Elsie CROME, MD     Additional history obtained:  -External records from outside source obtained and reviewed including: Chart review including previous notes, labs, imaging, consultation notes including prior notes    Lab Tests: -I ordered, reviewed, and interpreted labs.   The pertinent results include:   Labs Reviewed  RESP PANEL BY RT-PCR (RSV, FLU A&B, COVID)  RVPGX2 - Abnormal; Notable for the following components:      Result Value   Influenza A by PCR POSITIVE (*)    All other components within normal limits  COMPREHENSIVE METABOLIC PANEL WITH GFR - Abnormal; Notable for the following components:   Chloride 97 (*)    Glucose, Bld 126 (*)    Alkaline Phosphatase 176 (*)    Anion gap 17 (*)    All other components within normal limits  CBC WITH DIFFERENTIAL/PLATELET - Abnormal; Notable for the following  components:   Monocytes Absolute 1.2 (*)    All other components within normal limits  URINALYSIS, ROUTINE W REFLEX MICROSCOPIC - Abnormal; Notable for the following components:   Specific Gravity, Urine 1.040 (*)    Ketones, ur 20 (*)    All other components within normal limits  D-DIMER, QUANTITATIVE - Abnormal; Notable for the following components:   D-Dimer, Quant 1.10 (*)    All other components within normal limits  LIPASE, BLOOD  HIV ANTIBODY (ROUTINE  TESTING W REFLEX)  BASIC METABOLIC PANEL WITH GFR  CBC  TROPONIN T, HIGH SENSITIVITY  TROPONIN T, HIGH SENSITIVITY    Notable for influenza, elevated d-dimer  EKG   EKG Interpretation Date/Time:  Monday February 11 2024 13:43:38 EST Ventricular Rate:  98 PR Interval:  159 QRS Duration:  91 QT Interval:  347 QTC Calculation: 443 R Axis:   34  Text Interpretation: Sinus rhythm Confirmed by Francesca Fallow ([id number]) on 02/11/2024 2:37:53 PM         Imaging Studies ordered: I ordered imaging studies including CT chest On my interpretation imaging demonstrates emphysematous changes  I independently visualized and interpreted imaging. I agree with the radiologist interpretation   Medicines ordered and prescription drug management: Meds ordered this encounter  Medications   sodium chloride  0.9 % bolus 1,000 mL   ondansetron  (ZOFRAN ) injection 4 mg   ipratropium-albuterol  (DUONEB) 0.5-2.5 (3) MG/3ML nebulizer solution 3 mL   methylPREDNISolone  sodium succinate (SOLU-MEDROL ) 125 mg/2 mL injection 125 mg   iohexol  (OMNIPAQUE ) 350 MG/ML injection 75 mL   ipratropium-albuterol  (DUONEB) 0.5-2.5 (3) MG/3ML nebulizer solution 3 mL   albuterol  (PROVENTIL ) (2.5 MG/3ML) 0.083% nebulizer solution   oseltamivir  (TAMIFLU ) capsule 75 mg   doxycycline  (VIBRA -TABS) tablet 100 mg   enoxaparin  (LOVENOX ) injection 40 mg   ipratropium-albuterol  (DUONEB) 0.5-2.5 (3) MG/3ML nebulizer solution 3 mL   predniSONE  (DELTASONE ) tablet 40 mg    oseltamivir  (TAMIFLU ) capsule 75 mg    -I have reviewed the patients home medicines and have made adjustments as needed   Consultations Obtained: I requested consultation with the hospitalist,  and discussed lab and imaging findings as well as pertinent plan - they recommend: admission   Cardiac Monitoring: The patient was maintained on a cardiac monitor.  I personally viewed and interpreted the cardiac monitored which showed an underlying rhythm of: NSR  Social Determinants of Health:  Diagnosis or treatment significantly limited by social determinants of health: obesity   Reevaluation: After the interventions noted above, I reevaluated the patient and found that their symptoms have improved  Co morbidities that complicate the patient evaluation  Past Medical History:  Diagnosis Date   Arthritis 03/2011   Gout- Right foot   CVA (cerebral infarction)    GERD (gastroesophageal reflux disease)    Gout    Headache    Migraine Headaches   History of kidney stones    PUD (peptic ulcer disease) 2016   Raynaud's syndrome    Stroke [facility]) 2011      Dispostion: Disposition decision including need for hospitalization was considered, and patient admitted to the hospital.    Final Clinical Impression(s) / ED Diagnoses Final diagnoses:  COPD exacerbation (HCC)  Influenza A  Bronchitis     This chart was dictated using voice recognition software.  Despite best efforts to proofread,  errors can occur which can change the documentation meaning.     [1]  Social History Tobacco Use   Smoking status: Former    Current packs/day: 0.00    Average packs/day: 3.0 packs/day for 30.0 years (90.0 ttl pk-yrs)    Types: Cigarettes    Start date: 01/31/1975    Quit date: 01/30/2005    Years since quitting: 19.0    Passive exposure: Past   Smokeless tobacco: Never  Vaping Use   Vaping status: Never Used  Substance Use Topics   Alcohol use: Not Currently   Drug use: No      Francesca Fallow CROME, MD 02/11/24 1854  "

## 2024-02-11 NOTE — ED Notes (Signed)
 Pt informed provider he is unable to urinate at this time.

## 2024-02-11 NOTE — H&P (Signed)
 " History and Physical    Patient: Cory Roberts FMW:[id number] DOB: [birth year] DOA: 02/11/2024 DOS: the patient was seen and examined on 02/11/2024 PCP: Patient, No Pcp Per  Patient coming from: Home  Chief Complaint: Shortness of breath, cough, wheezing Chief Complaint  Patient presents with   Cough   Emesis   Diarrhea   HPI: Cory Roberts is a 64 y.o. male with medical history significant of history of CVA, GERD, migraine, PUD, Raynaud's phenomenon was diagnosed of influenza A infection for the last 4 days and presents to the emergency room with cough, congestion, and wheezing.  According to patient respiratory function continued to worsen and therefore came to the emergency room for further management.  Patient had no acute history from 12 years to about 64 years of age and has since quit smoking.  According to him he has not been formally diagnosed of COPD however he has been having chronic wheezing and exertional dyspnea even long before his flu infection.  At the time patient was being seen he denied chest pain, nausea vomiting abdominal pain or urinary complaints.  ED course: Upon arrival to the emergency room patient had temperature 98.8, respiratory rate 22, pulse 97, blood pressure 138/85 saturating 93% on room air. Patient had elevated D-dimer however CT angio did not show any PE, given persistent wheezing despite nebulization in the emergency room with associated increased work of breathing hospitalist service was contacted to admit patient for further management.  Review of Systems: As mentioned in the history of present illness. All other systems reviewed and are negative. Past Medical History:  Diagnosis Date   Arthritis 03/2011   Gout- Right foot   CVA (cerebral infarction)    GERD (gastroesophageal reflux disease)    Gout    Headache    Migraine Headaches   History of kidney stones    PUD (peptic ulcer disease) 2016   Raynaud's syndrome    Stroke [facility]) 2011   Past  Surgical History:  Procedure Laterality Date   COLONOSCOPY N/A 03/22/2015   Procedure: COLONOSCOPY;  Surgeon: Margo LITTIE Haddock, MD;  Location: [facility];  Service: Endoscopy;  Laterality: N/A;  1215   ESOPHAGOGASTRODUODENOSCOPY N/A 12/22/2014   Dr. Haddock: 1. dysphagia due to mid-esophageal web and uncontrolled reflux esophagitis. 2. small hiatal hernia 3. multiple small gastric and duodenal ulcers and mild duodenitis . Path with reactive gastropathy, negative H. pylori    ESOPHAGOGASTRODUODENOSCOPY N/A 03/22/2015   Procedure: ESOPHAGOGASTRODUODENOSCOPY (EGD);  Surgeon: Margo LITTIE Haddock, MD;  Location: [facility];  Service: Endoscopy;  Laterality: N/A;   HERNIA REPAIR Left 2007   INCISION AND DRAINAGE Right 12/18/2014   Procedure: INCISION AND DRAINAGE THUMB;  Surgeon: Taft FORBES Minerva, MD;  Location: [facility];  Service: Orthopedics;  Laterality: Right;   INCISION AND DRAINAGE Right 12/21/2014   Procedure: INCISION AND DRAINAGE RIGHT THUMB;  Surgeon: Taft FORBES Minerva, MD;  Location: [facility];  Service: Orthopedics;  Laterality: Right;   incision and drainage of thumb  12/18/14   ORIF FEMUR FRACTURE Right 10/28/2020   Procedure: OPEN REDUCTION INTERNAL FIXATION (ORIF) DISTAL FEMUR FRACTURE;  Surgeon: Fidel Rogue, MD;  Location: [facility];  Service: Orthopedics;  Laterality: Right;   TOTAL HIP ARTHROPLASTY Right 09/18/2016   TOTAL HIP ARTHROPLASTY Right 09/18/2016   Procedure: TOTAL HIP ARTHROPLASTY ANTERIOR APPROACH;  Surgeon: Fidel Rogue, MD;  Location: [facility] OR;  Service: Orthopedics;  Laterality: Right;   TOTAL HIP ARTHROPLASTY Left 02/26/2017   Procedure: LEFT  TOTAL HIP ARTHROPLASTY; ANTERIOR APPROACH;  Surgeon: Fidel Rogue, MD;  Location: [facility] OR;  Service: Orthopedics;  Laterality: Left;   Social History:  reports that he quit smoking about 19 years ago. His smoking use included cigarettes. He started smoking about 49 years ago. He has a 90 pack-year smoking history. He has been exposed  to tobacco smoke. He has never used smokeless tobacco. He reports that he does not currently use alcohol. He reports that he does not use drugs.  Allergies[1]  Family History  Problem Relation Age of Onset   Cancer Mother    Hyperlipidemia Mother    Hypertension Mother    Heart attack Mother    Rheumatologic disease Mother    Cancer Father    Heart disease Father 62       Heart Disease before age 5   Heart attack Father    Diabetes Sister    Cancer Sister    Hyperlipidemia Sister    Hypertension Sister    Colon cancer Neg Hx     Prior to Admission medications  Medication Sig Start Date End Date Taking? Authorizing Provider  pantoprazole  (PROTONIX ) 40 MG tablet Take 1 tablet (40 mg total) by mouth daily before breakfast. 11/21/23   Cindie Carlin POUR, DO    Physical Exam: Vitals:   02/11/24 1630 02/11/24 1712 02/11/24 1730 02/11/24 1755  BP: 119/73  133/71   Pulse: 83  93   Resp: 18  18   Temp:   98.2 F (36.8 C)   TempSrc:   Oral   SpO2: 95% 92% 90% 97%  Weight:      Height:       General: Elderly male, in some respiratory distress laying in bed CNS: S1, S2 are normal CVS: Alert and oriented x 3 Respiratory: Decreased air entry bibasilarly Musculoskeletal: Lower extremities without any edema Abdomen: Nontender no masses palpable  Data Reviewed: Chest x-ray reviewed that did not show any acute intra pulmonary pathology CT angio of the chest did not show any acute PE however it did show emphysema bilaterally as well as a 6 mm nodule at the left apex    Latest Ref Rng & Units 02/11/2024    2:02 PM 08/03/2021    3:13 AM 08/02/2021   11:57 PM  CBC  WBC 4.0 - 10.5 K/uL 8.7  10.5    Hemoglobin 13.0 - 17.0 g/dL 84.6  85.3  84.6   Hematocrit 39.0 - 52.0 % 44.3  43.0  45.0   Platelets 150 - 400 K/uL 167  248         Latest Ref Rng & Units 02/11/2024    2:02 PM 08/03/2021    3:13 AM 08/02/2021   11:57 PM  BMP  Glucose 70 - 99 mg/dL 873  899  85   BUN 8 - 23 mg/dL 18  31   47   Creatinine 0.61 - 1.24 mg/dL 8.80  8.63  8.29   Sodium 135 - 145 mmol/L 137  139  140   Potassium 3.5 - 5.1 mmol/L 3.8  2.9  3.6   Chloride 98 - 111 mmol/L 97  102  104   CO2 22 - 32 mmol/L 23  22    Calcium  8.9 - 10.3 mg/dL 9.3  9.3       Assessment and Plan:  Acute hypoxic respiratory failure secondary to influenza A infection as well as possible underlying undiagnosed COPD Patient's influenza test came back positive CT scan of the chest  showed findings of emphysema Patient have not been formally been diagnosed of COPD We will cover with as needed nebulization We will complete 5 days course of prednisone  Continue Tamiflu  Continue current oxygen to maintain appropriate saturation Patient will benefit from pulmonologist follow-up at discharge for PFTs  PUD/GERD Continue Protonix   Raynaud's phenomenon  Outpatient follow-up  Left pulmonary nodule Outpatient follow-up CT  DVT prophylaxis-continue Lovenox    Advance Care Planning:   Code Status: Prior full code  Consults: None  Family Communication: None at bedside  Severity of Illness: The appropriate patient status for this patient is OBSERVATION. Observation status is judged to be reasonable and necessary in order to provide the required intensity of service to ensure the patient's safety. The patient's presenting symptoms, physical exam findings, and initial radiographic and laboratory data in the context of their medical condition is felt to place them at decreased risk for further clinical deterioration. Furthermore, it is anticipated that the patient will be medically stable for discharge from the hospital within 2 midnights of admission.   Author: Drue ONEIDA Potter, MD 02/11/2024 5:57 PM  For on call review [email or web address].      [1]  Allergies Allergen Reactions   Codeine Nausea And Vomiting   Simvastatin  Other (See Comments)    simvastatin    Colchicine Diarrhea   Hydrocodone  Nausea Only   Pholcodine Other  (See Comments)   "

## 2024-02-12 LAB — BASIC METABOLIC PANEL WITH GFR
Anion gap: 11 (ref 5–15)
BUN: 21 mg/dL (ref 8–23)
CO2: 27 mmol/L (ref 22–32)
Calcium: 9.2 mg/dL (ref 8.9–10.3)
Chloride: 98 mmol/L (ref 98–111)
Creatinine, Ser: 1.07 mg/dL (ref 0.61–1.24)
GFR, Estimated: >60 mL/min
Glucose, Bld: 193 mg/dL — ABNORMAL HIGH (ref 70–99)
Potassium: 3.1 mmol/L — ABNORMAL LOW (ref 3.5–5.1)
Sodium: 136 mmol/L (ref 135–145)

## 2024-02-12 LAB — CBC
HCT: 40.1 % (ref 39.0–52.0)
Hemoglobin: 14.2 g/dL (ref 13.0–17.0)
MCH: 33.4 pg (ref 26.0–34.0)
MCHC: 35.4 g/dL (ref 30.0–36.0)
MCV: 94.4 fL (ref 80.0–100.0)
Platelets: 165 K/uL (ref 150–400)
RBC: 4.25 MIL/uL (ref 4.22–5.81)
RDW: 12.5 % (ref 11.5–15.5)
WBC: 7 K/uL (ref 4.0–10.5)
nRBC: 0 % (ref 0.0–0.2)

## 2024-02-12 LAB — HIV ANTIBODY (ROUTINE TESTING W REFLEX): HIV Screen 4th Generation wRfx: NONREACTIVE

## 2024-02-12 MED ORDER — POTASSIUM CHLORIDE CRYS ER 20 MEQ PO TBCR
40.0000 meq | EXTENDED_RELEASE_TABLET | Freq: Once | ORAL | Status: AC
  Administered 2024-02-12: 40 meq via ORAL
  Filled 2024-02-12: qty 2

## 2024-02-12 MED ORDER — ALBUTEROL SULFATE HFA 108 (90 BASE) MCG/ACT IN AERS: 2.0000 | INHALATION_SPRAY | Freq: Four times a day (QID) | RESPIRATORY_TRACT | 2 refills | Status: AC | PRN

## 2024-02-12 MED ORDER — PREDNISONE 20 MG PO TABS: 40.0000 mg | ORAL_TABLET | Freq: Every day | ORAL | 0 refills | Status: AC

## 2024-02-12 MED ORDER — PANTOPRAZOLE SODIUM 40 MG PO TBEC: 40.0000 mg | DELAYED_RELEASE_TABLET | Freq: Every day | ORAL | 11 refills | Status: AC

## 2024-02-12 MED ORDER — OSELTAMIVIR PHOSPHATE 75 MG PO CAPS: 75.0000 mg | ORAL_CAPSULE | Freq: Two times a day (BID) | ORAL | 0 refills | Status: AC

## 2024-02-12 MED ORDER — GUAIFENESIN ER 600 MG PO TB12: 600.0000 mg | ORAL_TABLET | Freq: Two times a day (BID) | ORAL | 0 refills | Status: DC

## 2024-02-12 MED ORDER — GUAIFENESIN ER 600 MG PO TB12
600.0000 mg | ORAL_TABLET | Freq: Two times a day (BID) | ORAL | Status: AC
  Administered 2024-02-12 (×2): 600 mg via ORAL
  Filled 2024-02-12 (×2): qty 1

## 2024-02-12 MED ORDER — BREZTRI AEROSPHERE 160-9-4.8 MCG/ACT IN AERO: 2.0000 | INHALATION_SPRAY | Freq: Two times a day (BID) | RESPIRATORY_TRACT | 1 refills | Status: AC

## 2024-02-12 NOTE — Progress Notes (Signed)
 Mobility Specialist Progress Note:    02/12/24 0947  Mobility  Activity Ambulated with assistance  Level of Assistance Independent  Assistive Device None  Distance Ambulated (ft) 140 ft  Range of Motion/Exercises Active;All extremities  Activity Response Tolerated well  Mobility Referral Yes  Mobility visit 1 Mobility  Mobility Specialist Start Time (ACUTE ONLY) [id number]  Mobility Specialist Stop Time (ACUTE ONLY) 1006  Mobility Specialist Time Calculation (min) (ACUTE ONLY) 19 min   Pt received in bed, MD requesting O2 test. Independently able to stand and ambulate with no AD. Tolerated well, SpO2 95% on RA at rest. SpO2 93% on RA during ambulation. Left sitting EOB, all needs met.  Juandiego Kolenovic Mobility Specialist Please contact via Special Educational Needs Teacher or  Rehab office at [phone]

## 2024-02-12 NOTE — Discharge Summary (Addendum)
 "                                                                                 Cory Roberts, is a 64 y.o. male  DOB 11/28/1960  MRN [id number].  Admission date:  02/11/2024  Admitting Physician  Drue ONEIDA Potter, MD  Discharge Date:  02/12/2024   Primary MD  Patient, No Pcp Per  Recommendations for primary care physician for things to follow:   1) please take medications as prescribed including Tamiflu  and albuterol  inhaler 2)Avoid ibuprofen /Advil /Aleve /Motrin Josefine Powders/Naproxen /BC powders/Meloxicam/Diclofenac/Indomethacin and other Nonsteroidal anti-inflammatory medications as these will make you more likely to bleed and can cause stomach ulcers, can also cause Kidney problems.   Admission Diagnosis  Bronchitis [J40] Influenza A [J10.1] COPD exacerbation (HCC) [J44.1]   Discharge Diagnosis  Bronchitis [J40] Influenza A [J10.1] COPD exacerbation (HCC) [J44.1]   Principal Problem:   COPD exacerbation (HCC)      Past Medical History:  Diagnosis Date   Arthritis 03/2011   Gout- Right foot   CVA (cerebral infarction)    GERD (gastroesophageal reflux disease)    Gout    Headache    Migraine Headaches   History of kidney stones    PUD (peptic ulcer disease) 2016   Raynaud's syndrome    Stroke [facility]) 2011    Past Surgical History:  Procedure Laterality Date   COLONOSCOPY N/A 03/22/2015   Procedure: COLONOSCOPY;  Surgeon: Margo LITTIE Haddock, MD;  [facility];  Service: Endoscopy;  Laterality: N/A;  1215   ESOPHAGOGASTRODUODENOSCOPY N/A 12/22/2014   Dr. Haddock: 1. dysphagia due to mid-esophageal web and uncontrolled reflux esophagitis. 2. small hiatal hernia 3. multiple small gastric and duodenal ulcers and mild duodenitis . Path with reactive gastropathy, negative H. pylori    ESOPHAGOGASTRODUODENOSCOPY N/A 03/22/2015   Procedure: ESOPHAGOGASTRODUODENOSCOPY (EGD);  Surgeon: Margo LITTIE Haddock, MD;  [facility];  Service: Endoscopy;  Laterality: N/A;   HERNIA  REPAIR Left 2007   INCISION AND DRAINAGE Right 12/18/2014   Procedure: INCISION AND DRAINAGE THUMB;  Surgeon: Taft FORBES Minerva, MD;  Location: [facility] OR[facility];  Service: Orthopedics;  Laterality: Right;   INCISION AND DRAINAGE Right 12/21/2014   Procedure: INCISION AND DRAINAGE RIGHT THUMB;  Surgeon: Taft FORBES Minerva, MD;  Location: [facility];  Service: Orthopedics;  Laterality: Right;   incision and drainage of thumb  12/18/14   ORIF FEMUR FRACTURE Right 10/28/2020   Procedure: OPEN REDUCTION INTERNAL FIXATION (ORIF) DISTAL FEMUR FRACTURE;  Surgeon: Fidel Rogue, MD;  Location: [facility];  Service: Orthopedics;  Laterality: Right;   TOTAL HIP ARTHROPLASTY Right 09/18/2016   TOTAL HIP ARTHROPLASTY Right 09/18/2016   Procedure: TOTAL HIP ARTHROPLASTY ANTERIOR APPROACH;  Surgeon: Fidel Rogue, MD;  Location: [facility] OR;  Service: Orthopedics;  Laterality: Right;   TOTAL HIP ARTHROPLASTY Left 02/26/2017   Procedure: LEFT TOTAL HIP ARTHROPLASTY; ANTERIOR APPROACH;  Surgeon: Fidel Rogue, MD;  Location: [facility] OR;  Service: Orthopedics;  Laterality: Left;     HPI  from the history and physical done on the day of admission:  HPI: ALANO BLASCO is a 64 y.o. male with medical history significant of history of CVA, GERD,  migraine, PUD, Raynaud's phenomenon was diagnosed of influenza A infection for the last 4 days and presents to the emergency room with cough, congestion, and wheezing.  According to patient respiratory function continued to worsen and therefore came to the emergency room for further management.  Patient had no acute history from 12 years to about 64 years of age and has since quit smoking.  According to him he has not been formally diagnosed of COPD however he has been having chronic wheezing and exertional dyspnea even long before his flu infection.  At the time patient was being seen he denied chest pain, nausea vomiting abdominal pain or urinary complaints.   ED course: Upon arrival to the emergency  room patient had temperature 98.8, respiratory rate 22, pulse 97, blood pressure 138/85 saturating 93% on room air. Patient had elevated D-dimer however CT angio did not show any PE, given persistent wheezing despite nebulization in the emergency room with associated increased work of breathing hospitalist service was contacted to admit patient for further management.   Review of Systems: As mentioned in the history of present illness. All other systems reviewed and are negative.    Hospital Course:    Assessment and Plan: 1)Acute Hypoxic Respiratory Failure secondary to influenza A infection as well as underlying COPD/Emphysema CT scan of the chest showed findings of emphysema--patient has worked with chemicals/dye for several years -He quit smoking in his 82s -Respiratory status improved significantly with steroids and bronchodilators -Discharged on prednisone , and bronchodilators and mucolytics =-Okay to complete Tamiflu  --Post ambulation oxygen saturation on room air is 93 to 95% Outpatient follow-up with pulmonologist at discharge for PFTs advised   PUD/GERD Continue Protonix    Raynaud's phenomenon  Outpatient follow-up   Left pulmonary nodule Outpatient follow-up CT as advised  Discharge Condition: stable, without hypoxia  Follow UP--- outpatient follow-up pulmonologist advised  Diet and Activity recommendation:  As advised  Discharge Instructions    Discharge Instructions     Call MD for:  difficulty breathing, headache or visual disturbances   Complete by: As directed    Call MD for:  persistant dizziness or light-headedness   Complete by: As directed    Call MD for:  persistant nausea and vomiting   Complete by: As directed    Call MD for:  temperature >100.4   Complete by: As directed    Diet - low sodium heart healthy   Complete by: As directed    Discharge instructions   Complete by: As directed    1) please take medications as prescribed including  Tamiflu  and albuterol  inhaler 2)Avoid ibuprofen /Advil /Aleve /Motrin Josefine Powders/Naproxen /BC powders/Meloxicam/Diclofenac/Indomethacin and other Nonsteroidal anti-inflammatory medications as these will make you more likely to bleed and can cause stomach ulcers, can also cause Kidney problems.   Increase activity slowly   Complete by: As directed        Discharge Medications     Allergies as of 02/12/2024       Reactions   Codeine Nausea And Vomiting   Simvastatin  Other (See Comments)   simvastatin    Colchicine Diarrhea   Hydrocodone  Nausea Only   Pholcodine Other (See Comments)        Medication List     TAKE these medications    albuterol  108 (90 Base) MCG/ACT inhaler Commonly known as: VENTOLIN  HFA Inhale 2 puffs into the lungs every 6 (six) hours as needed for wheezing or shortness of breath.   Breztri  Aerosphere 160-9-4.8 MCG/ACT Aero inhaler Generic drug: budesonide-glycopyrrolate -formoterol Inhale 2 puffs into  the lungs in the morning and at bedtime.   guaiFENesin  600 MG 12 hr tablet Commonly known as: Mucinex  Take 1 tablet (600 mg total) by mouth 2 (two) times daily.   oseltamivir  75 MG capsule Commonly known as: TAMIFLU  Take 1 capsule (75 mg total) by mouth 2 (two) times daily for 5 days.   pantoprazole  40 MG tablet Commonly known as: PROTONIX  Take 1 tablet (40 mg total) by mouth daily before breakfast.   predniSONE  20 MG tablet Commonly known as: DELTASONE  Take 2 tablets (40 mg total) by mouth daily with breakfast for 5 days.       Major procedures and Radiology Reports - PLEASE review detailed and final reports for all details, in brief -    CT Angio Chest PE W and/or Wo Contrast Result Date: 02/11/2024 CLINICAL DATA:  Wheezing chest discomfort cough congestion EXAM: CT ANGIOGRAPHY CHEST WITH CONTRAST TECHNIQUE: Multidetector CT imaging of the chest was performed using the standard protocol during bolus administration of intravenous contrast.  Multiplanar CT image reconstructions and MIPs were obtained to evaluate the vascular anatomy. RADIATION DOSE REDUCTION: This exam was performed according to the departmental dose-optimization program which includes automated exposure control, adjustment of the mA and/or kV according to patient size and/or use of iterative reconstruction technique. CONTRAST:  75mL OMNIPAQUE  IOHEXOL  350 MG/ML SOLN COMPARISON:  Chest x-ray 02/11/2024, chest CT 05/10/2017 FINDINGS: Cardiovascular: Satisfactory opacification of the pulmonary arteries to the segmental level. No evidence of pulmonary embolism. Mild atherosclerosis. No aneurysm or dissection. Coronary vascular calcification. Upper normal cardiac size. No pericardial effusion Mediastinum/Nodes: Patent trachea. No suspicious thyroid  mass. Mildly prominent lymph nodes, for example left paratracheal node measuring 13 mm, previously 14 mm. Subcarinal lymph node measuring 11 mm, previously 12 mm. Small hilar nodes. Esophagus within normal limits aside from small hiatal hernia Lungs/Pleura: Mild emphysema. No consolidation, pleural effusion or pneumothorax. Bilateral bronchial wall thickening. Minimal mucous plugging in the lower lobes. Ground-glass nodule at the left apex measuring 6 mm, series 11, image 26. Mild subpleural reticulation. Upper Abdomen: No acute finding Musculoskeletal: No acute osseous abnormality Review of the MIP images confirms the above findings. IMPRESSION: 1. Negative for acute pulmonary embolus. 2. Emphysema with bilateral bronchial wall thickening and minimal mucous plugging in the lower lobes, suspect for airways inflammatory process. 3. 6 mm ground-glass nodule at the left apex. Initial follow-up with CT at 6 months is recommended to confirm persistence. If persistent, repeat CT is recommended every 2 years until 5 years of stability has been established. This recommendation follows the consensus statement: Guidelines for Management of Incidental  Pulmonary Nodules Detected on CT Images: From the Fleischner Society 2017; Radiology 2017; 284:228-243. 4. Aortic atherosclerosis. Aortic Atherosclerosis (ICD10-I70.0) and Emphysema (ICD10-J43.9). Electronically Signed   By: Luke Bun M.D.   On: 02/11/2024 16:44   DG Chest 2 View Result Date: 02/11/2024 EXAM: 2 VIEW(S) XRAY OF THE CHEST 02/11/2024 02:44:59 PM COMPARISON: None available. CLINICAL HISTORY: cough FINDINGS: LUNGS AND PLEURA: Chronic peripheral interstitial thickening. No focal pulmonary opacity. No pleural effusion. No pneumothorax. HEART AND MEDIASTINUM: No acute abnormality of the cardiac and mediastinal silhouettes. BONES AND SOFT TISSUES: No acute fracture or destructive lesion. Multilevel thoracic osteophytosis. IMPRESSION: 1. No acute findings. 2. Chronic peripheral interstitial thickening. Electronically signed by: Norleen Boxer MD MD 02/11/2024 03:46 PM EST RP Workstation: [id number]   Micro Results   Recent Results (from the past 240 hours)  Resp panel by RT-PCR (RSV, Flu A&B, Covid) Anterior Nasal Swab  Status: Abnormal   Collection Time: 02/11/24  1:50 PM   Specimen: Anterior Nasal Swab  Result Value Ref Range Status   SARS Coronavirus 2 by RT PCR NEGATIVE NEGATIVE Final    Comment: (NOTE) SARS-CoV-2 target nucleic acids are NOT DETECTED.  The SARS-CoV-2 RNA is generally detectable in upper respiratory specimens during the acute phase of infection. The lowest concentration of SARS-CoV-2 viral copies this assay can detect is 138 copies/mL. A negative result does not preclude SARS-Cov-2 infection and should not be used as the sole basis for treatment or other patient management decisions. A negative result may occur with  improper specimen collection/handling, submission of specimen other than nasopharyngeal swab, presence of viral mutation(s) within the areas targeted by this assay, and inadequate number of viral copies(<138 copies/mL). A negative result must be  combined with clinical observations, patient history, and epidemiological information. The expected result is Negative.  Fact Sheet for Patients:  bloggercourse.com  Fact Sheet for Healthcare Providers:  seriousbroker.it  This test is no t yet approved or cleared by the [place]  FDA and  has been authorized for detection and/or diagnosis of SARS-CoV-2 by FDA under an Emergency Use Authorization (EUA). This EUA will remain  in effect (meaning this test can be used) for the duration of the COVID-19 declaration under Section 564(b)(1) of the Act, 21 U.S.C.section 360bbb-3(b)(1), unless the authorization is terminated  or revoked sooner.       Influenza A by PCR POSITIVE (A) NEGATIVE Final   Influenza B by PCR NEGATIVE NEGATIVE Final    Comment: (NOTE) The Xpert Xpress SARS-CoV-2/FLU/RSV plus assay is intended as an aid in the diagnosis of influenza from Nasopharyngeal swab specimens and should not be used as a sole basis for treatment. Nasal washings and aspirates are unacceptable for Xpert Xpress SARS-CoV-2/FLU/RSV testing.  Fact Sheet for Patients: bloggercourse.com  Fact Sheet for Healthcare Providers: seriousbroker.it  This test is not yet approved or cleared by the [place]  FDA and has been authorized for detection and/or diagnosis of SARS-CoV-2 by FDA under an Emergency Use Authorization (EUA). This EUA will remain in effect (meaning this test can be used) for the duration of the COVID-19 declaration under Section 564(b)(1) of the Act, 21 U.S.C. section 360bbb-3(b)(1), unless the authorization is terminated or revoked.     Resp Syncytial Virus by PCR NEGATIVE NEGATIVE Final    Comment: (NOTE) Fact Sheet for Patients: bloggercourse.com  Fact Sheet for Healthcare Providers: seriousbroker.it  This test is not  yet approved or cleared by the [place]  FDA and has been authorized for detection and/or diagnosis of SARS-CoV-2 by FDA under an Emergency Use Authorization (EUA). This EUA will remain in effect (meaning this test can be used) for the duration of the COVID-19 declaration under Section 564(b)(1) of the Act, 21 U.S.C. section 360bbb-3(b)(1), unless the authorization is terminated or revoked.  Performed at [facility], [address] [place], [place] [address]    Today   Subjective    Mahlon Gabrielle today has no new complaints - Much improved respiratory status overall -No wheezing or dyspnea - --Post ambulation oxygen saturation on room air is 93 to 95% No fever  Or chills   No Nausea, Vomiting or Diarrhea   Patient has been seen and examined prior to discharge   Objective   Blood pressure 120/68, pulse 95, temperature 98.2 F (36.8 C), temperature source Oral, resp. rate 20, height 5' 8 (1.727 m), weight 83.9 kg, SpO2 95%.   Intake/Output Summary (  Last 24 hours) at 02/12/2024 1125 Last data filed at 02/12/2024 0600 Gross per 24 hour  Intake --  Output 1100 ml  Net -1100 ml    Exam Gen:- Awake Alert, no acute distress, speaking in complete sentences HEENT:- [place].AT, No sclera icterus Neck-Supple Neck,No JVD,.  Lungs-  CTAB , good air movement bilaterally CV- S1, S2 normal, regular Abd-  +ve B.Sounds, Abd Soft, No tenderness,    Extremity/Skin:- No  edema,   good pulses Psych-affect is appropriate, oriented x3 Neuro-no new focal deficits, no tremors    Data Review   CBC w Diff:  Lab Results  Component Value Date   WBC 7.0 02/12/2024   HGB 14.2 02/12/2024   HCT 40.1 02/12/2024   PLT 165 02/12/2024   LYMPHOPCT 14 02/11/2024   MONOPCT 14 02/11/2024   EOSPCT 3 02/11/2024   BASOPCT 0 02/11/2024    CMP:  Lab Results  Component Value Date   NA 136 02/12/2024   K 3.1 (L) 02/12/2024   CL 98 02/12/2024   CO2 27 02/12/2024   BUN 21 02/12/2024   CREATININE  1.07 02/12/2024   PROT 7.0 02/11/2024   ALBUMIN 4.0 02/11/2024   BILITOT 1.1 02/11/2024   ALKPHOS 176 (H) 02/11/2024   AST 22 02/11/2024   ALT 17 02/11/2024  .  Total Discharge time is about 33 minutes  Rendall Carwin M.D on 02/12/2024 at 11:25 AM  Go to [email or web address] -  for contact info  [facility] - Office  [phone]   "

## 2024-02-12 NOTE — Discharge Instructions (Addendum)
" °  1) please take medications as prescribed including Tamiflu  and albuterol  inhaler 2)Avoid ibuprofen /Advil /Aleve /Motrin Josefine Powders/Naproxen /BC powders/Meloxicam/Diclofenac/Indomethacin and other Nonsteroidal anti-inflammatory medications as these will make you more likely to bleed and can cause stomach ulcers, can also cause Kidney problems.        Providers Accepting New Patients in [place], [place]    [facility] [address]  [place], [place] [address] [phone] Accepts most insurances  [facility] [address] [place], [place] [address] [phone] Accepts most insurances  [facility] [address] [place], [place] [address]  [phone] Must meet requirements  [facility] [address] [place], [place] [address] [phone] Accepts most insurances  [facility] [address]  [place], [place] [address] [phone] Accepts most insurances  [facility] [address]   [place], [place]   [phone] Accepts most insurances  [facility])  [phone] [address]  [place], [place] [address] [phone] Accepts most insurances     [facility] [address]  [place], [place] [address] [phone] Accepts most insurances  [facility] [address] [place], [place] [address] [phone] option 1 Accepts Medicaid and [facility] [address]  [place], [place] [address] [phone] Accepts most insurances  Benita Outhouse, MD [address] [place], [place] [address] [phone] Accepts most insurances  [facility] [address]  [place], [place] [address] [phone] Accepts most insurances  [facility] [phone] [place], [place] [address] [phone] Accepts most insurances  [place] [place] [address], [address] [place], [place] [address] [phone]  Accepts most insurances "

## 2024-02-12 NOTE — TOC CM/SW Note (Signed)
 Transition of Care [facility]) - Inpatient Brief Assessment   Patient Details  Name: Cory Roberts MRN: [id number] Date of Birth: 1960-07-03  [facility] CM/SW Contact:    Lucie Lunger, LCSWA Phone Number: 02/12/2024, 8:21 AM   Clinical Narrative: Transition of Care Department [facility]) has reviewed patient and no TOC needs have been identified at this time. We will continue to monitor patient advancement through interdiciplinary progression rounds. If new patient transition needs arise, please place a TOC consult.  Transition of Care Asessment: Insurance and Status: Insurance coverage has been reviewed Patient has primary care physician: No (PCP list added to AVS) Home environment has been reviewed: From home Prior level of function:: Independent Prior/Current Home Services: No current home services Social Drivers of Health Review: SDOH reviewed no interventions necessary Readmission risk has been reviewed: Yes Transition of care needs: no transition of care needs at this time

## 2024-02-19 ENCOUNTER — Emergency Department (HOSPITAL_COMMUNITY)

## 2024-02-19 ENCOUNTER — Ambulatory Visit: Payer: Self-pay

## 2024-02-19 ENCOUNTER — Encounter (HOSPITAL_COMMUNITY): Payer: Self-pay

## 2024-02-19 ENCOUNTER — Inpatient Hospital Stay (HOSPITAL_COMMUNITY)
Admission: EM | Admit: 2024-02-19 | Discharge: 2024-02-22 | DRG: 871 | Disposition: A | Discharge status: Home / Self Care | Attending: Internal Medicine | Admitting: Internal Medicine

## 2024-02-19 ENCOUNTER — Other Ambulatory Visit: Payer: Self-pay

## 2024-02-19 DIAGNOSIS — K219 Gastro-esophageal reflux disease without esophagitis: Secondary | ICD-10-CM | POA: Diagnosis present

## 2024-02-19 DIAGNOSIS — Z8673 Personal history of transient ischemic attack (TIA), and cerebral infarction without residual deficits: Secondary | ICD-10-CM

## 2024-02-19 DIAGNOSIS — Z79899 Other long term (current) drug therapy: Secondary | ICD-10-CM

## 2024-02-19 DIAGNOSIS — Z87442 Personal history of urinary calculi: Secondary | ICD-10-CM | POA: Diagnosis not present

## 2024-02-19 DIAGNOSIS — N200 Calculus of kidney: Secondary | ICD-10-CM | POA: Diagnosis present

## 2024-02-19 DIAGNOSIS — K279 Peptic ulcer, site unspecified, unspecified as acute or chronic, without hemorrhage or perforation: Secondary | ICD-10-CM | POA: Diagnosis not present

## 2024-02-19 DIAGNOSIS — Z8249 Family history of ischemic heart disease and other diseases of the circulatory system: Secondary | ICD-10-CM

## 2024-02-19 DIAGNOSIS — Z833 Family history of diabetes mellitus: Secondary | ICD-10-CM | POA: Diagnosis not present

## 2024-02-19 DIAGNOSIS — Z87891 Personal history of nicotine dependence: Secondary | ICD-10-CM

## 2024-02-19 DIAGNOSIS — J189 Pneumonia, unspecified organism: Secondary | ICD-10-CM | POA: Diagnosis present

## 2024-02-19 DIAGNOSIS — J181 Lobar pneumonia, unspecified organism: Secondary | ICD-10-CM | POA: Diagnosis present

## 2024-02-19 DIAGNOSIS — Z888 Allergy status to other drugs, medicaments and biological substances status: Secondary | ICD-10-CM

## 2024-02-19 DIAGNOSIS — Z96643 Presence of artificial hip joint, bilateral: Secondary | ICD-10-CM | POA: Diagnosis present

## 2024-02-19 DIAGNOSIS — Z1152 Encounter for screening for COVID-19: Secondary | ICD-10-CM

## 2024-02-19 DIAGNOSIS — Z885 Allergy status to narcotic agent status: Secondary | ICD-10-CM

## 2024-02-19 DIAGNOSIS — Z83438 Family history of other disorder of lipoprotein metabolism and other lipidemia: Secondary | ICD-10-CM

## 2024-02-19 DIAGNOSIS — J440 Chronic obstructive pulmonary disease with acute lower respiratory infection: Secondary | ICD-10-CM | POA: Diagnosis present

## 2024-02-19 DIAGNOSIS — R197 Diarrhea, unspecified: Secondary | ICD-10-CM | POA: Diagnosis present

## 2024-02-19 DIAGNOSIS — A419 Sepsis, unspecified organism: Principal | ICD-10-CM | POA: Diagnosis present

## 2024-02-19 DIAGNOSIS — I7300 Raynaud's syndrome without gangrene: Secondary | ICD-10-CM | POA: Diagnosis present

## 2024-02-19 DIAGNOSIS — E876 Hypokalemia: Secondary | ICD-10-CM | POA: Diagnosis present

## 2024-02-19 DIAGNOSIS — Z8711 Personal history of peptic ulcer disease: Secondary | ICD-10-CM | POA: Diagnosis not present

## 2024-02-19 HISTORY — DX: Chronic obstructive pulmonary disease, unspecified: J44.9

## 2024-02-19 LAB — CBC WITH DIFFERENTIAL/PLATELET
Abs Immature Granulocytes: 0.16 K/uL — ABNORMAL HIGH (ref 0.00–0.07)
Basophils Absolute: 0 K/uL (ref 0.0–0.1)
Basophils Relative: 0 %
Eosinophils Absolute: 0 K/uL (ref 0.0–0.5)
Eosinophils Relative: 0 %
HCT: 46.1 % (ref 39.0–52.0)
Hemoglobin: 15.9 g/dL (ref 13.0–17.0)
Immature Granulocytes: 1 %
Lymphocytes Relative: 8 %
Lymphs Abs: 1.6 K/uL (ref 0.7–4.0)
MCH: 32.8 pg (ref 26.0–34.0)
MCHC: 34.5 g/dL (ref 30.0–36.0)
MCV: 95.1 fL (ref 80.0–100.0)
Monocytes Absolute: 1.1 K/uL — ABNORMAL HIGH (ref 0.1–1.0)
Monocytes Relative: 6 %
Neutro Abs: 16.7 K/uL — ABNORMAL HIGH (ref 1.7–7.7)
Neutrophils Relative %: 85 %
Platelets: 455 K/uL — ABNORMAL HIGH (ref 150–400)
RBC: 4.85 MIL/uL (ref 4.22–5.81)
RDW: 12.6 % (ref 11.5–15.5)
WBC: 19.6 K/uL — ABNORMAL HIGH (ref 4.0–10.5)
nRBC: 0 % (ref 0.0–0.2)

## 2024-02-19 LAB — URINALYSIS, W/ REFLEX TO CULTURE (INFECTION SUSPECTED)
Bacteria, UA: NONE SEEN
Bilirubin Urine: NEGATIVE
Glucose, UA: NEGATIVE mg/dL
Hgb urine dipstick: NEGATIVE
Ketones, ur: NEGATIVE mg/dL
Leukocytes,Ua: NEGATIVE
Nitrite: NEGATIVE
Protein, ur: 30 mg/dL — AB
Specific Gravity, Urine: 1.02 (ref 1.005–1.030)
pH: 5 (ref 5.0–8.0)

## 2024-02-19 LAB — COMPREHENSIVE METABOLIC PANEL WITH GFR
ALT: 66 U/L — ABNORMAL HIGH (ref 0–44)
AST: 42 U/L — ABNORMAL HIGH (ref 15–41)
Albumin: 3.9 g/dL (ref 3.5–5.0)
Alkaline Phosphatase: 163 U/L — ABNORMAL HIGH (ref 38–126)
Anion gap: 13 (ref 5–15)
BUN: 18 mg/dL (ref 8–23)
CO2: 27 mmol/L (ref 22–32)
Calcium: 9.5 mg/dL (ref 8.9–10.3)
Chloride: 96 mmol/L — ABNORMAL LOW (ref 98–111)
Creatinine, Ser: 1.26 mg/dL — ABNORMAL HIGH (ref 0.61–1.24)
GFR, Estimated: >60 mL/min
Glucose, Bld: 120 mg/dL — ABNORMAL HIGH (ref 70–99)
Potassium: 3.6 mmol/L (ref 3.5–5.1)
Sodium: 136 mmol/L (ref 135–145)
Total Bilirubin: 1.4 mg/dL — ABNORMAL HIGH (ref 0.0–1.2)
Total Protein: 7.7 g/dL (ref 6.5–8.1)

## 2024-02-19 LAB — LACTIC ACID, PLASMA: Lactic Acid, Venous: 1.4 mmol/L (ref 0.5–1.9)

## 2024-02-19 LAB — TROPONIN T, HIGH SENSITIVITY
Troponin T High Sensitivity: 18 ng/L (ref 0–19)
Troponin T High Sensitivity: 21 ng/L — ABNORMAL HIGH (ref 0–19)

## 2024-02-19 LAB — RESP PANEL BY RT-PCR (RSV, FLU A&B, COVID)  RVPGX2
Influenza A by PCR: NEGATIVE
Influenza B by PCR: NEGATIVE
Resp Syncytial Virus by PCR: NEGATIVE
SARS Coronavirus 2 by RT PCR: NEGATIVE

## 2024-02-19 MED ORDER — AZITHROMYCIN 250 MG PO TABS
500.0000 mg | ORAL_TABLET | Freq: Once | ORAL | Status: AC
  Administered 2024-02-19: 500 mg via ORAL
  Filled 2024-02-19: qty 2

## 2024-02-19 MED ORDER — SODIUM CHLORIDE 0.9 % IV SOLN
100.0000 mg | Freq: Two times a day (BID) | INTRAVENOUS | Status: AC
  Administered 2024-02-20 – 2024-02-21 (×3): 100 mg via INTRAVENOUS
  Filled 2024-02-19 (×5): qty 100

## 2024-02-19 MED ORDER — POLYETHYLENE GLYCOL 3350 17 G PO PACK: 17.0000 g | PACK | Freq: Every day | ORAL | Status: DC | PRN

## 2024-02-19 MED ORDER — SODIUM CHLORIDE 0.9 % IV BOLUS
500.0000 mL | Freq: Once | INTRAVENOUS | Status: AC
  Administered 2024-02-19: 500 mL via INTRAVENOUS

## 2024-02-19 MED ORDER — ACETAMINOPHEN 325 MG PO TABS
650.0000 mg | ORAL_TABLET | Freq: Once | ORAL | Status: AC
  Administered 2024-02-19: 650 mg via ORAL
  Filled 2024-02-19: qty 2

## 2024-02-19 MED ORDER — ONDANSETRON HCL 4 MG/2ML IJ SOLN
4.0000 mg | Freq: Four times a day (QID) | INTRAMUSCULAR | Status: DC | PRN
  Administered 2024-02-19 – 2024-02-21 (×2): 4 mg via INTRAVENOUS
  Filled 2024-02-19 (×2): qty 2

## 2024-02-19 MED ORDER — IPRATROPIUM-ALBUTEROL 0.5-2.5 (3) MG/3ML IN SOLN
3.0000 mL | Freq: Once | RESPIRATORY_TRACT | Status: AC
  Administered 2024-02-19: 3 mL via RESPIRATORY_TRACT
  Filled 2024-02-19: qty 3

## 2024-02-19 MED ORDER — SODIUM CHLORIDE 0.9 % IV SOLN: 500.0000 mg | INTRAVENOUS | Status: DC

## 2024-02-19 MED ORDER — ACETAMINOPHEN 650 MG RE SUPP: 650.0000 mg | Freq: Four times a day (QID) | RECTAL | Status: DC | PRN

## 2024-02-19 MED ORDER — ONDANSETRON HCL 4 MG PO TABS: 4.0000 mg | ORAL_TABLET | Freq: Four times a day (QID) | ORAL | Status: DC | PRN

## 2024-02-19 MED ORDER — SODIUM CHLORIDE 0.9 % IV SOLN
1.0000 g | Freq: Once | INTRAVENOUS | Status: AC
  Administered 2024-02-19: 1 g via INTRAVENOUS
  Filled 2024-02-19: qty 10

## 2024-02-19 MED ORDER — GUAIFENESIN-DM 100-10 MG/5ML PO SYRP
15.0000 mL | ORAL_SOLUTION | Freq: Three times a day (TID) | ORAL | Status: AC
  Administered 2024-02-19 – 2024-02-20 (×3): 15 mL via ORAL
  Filled 2024-02-19 (×3): qty 15

## 2024-02-19 MED ORDER — IOHEXOL 350 MG/ML SOLN
100.0000 mL | Freq: Once | INTRAVENOUS | Status: AC | PRN
  Administered 2024-02-19: 100 mL via INTRAVENOUS

## 2024-02-19 MED ORDER — ACETAMINOPHEN 325 MG PO TABS: 650.0000 mg | ORAL_TABLET | Freq: Four times a day (QID) | ORAL | Status: DC | PRN

## 2024-02-19 MED ORDER — IPRATROPIUM-ALBUTEROL 0.5-2.5 (3) MG/3ML IN SOLN
3.0000 mL | RESPIRATORY_TRACT | Status: DC | PRN
  Administered 2024-02-20 (×2): 3 mL via RESPIRATORY_TRACT
  Filled 2024-02-19 (×2): qty 3

## 2024-02-19 MED ORDER — PANTOPRAZOLE SODIUM 40 MG PO TBEC
40.0000 mg | DELAYED_RELEASE_TABLET | Freq: Every day | ORAL | Status: DC
  Administered 2024-02-20 – 2024-02-22 (×3): 40 mg via ORAL
  Filled 2024-02-19 (×3): qty 1

## 2024-02-19 MED ORDER — ENOXAPARIN SODIUM 40 MG/0.4ML IJ SOSY
40.0000 mg | PREFILLED_SYRINGE | INTRAMUSCULAR | Status: DC
  Administered 2024-02-19 – 2024-02-21 (×2): 40 mg via SUBCUTANEOUS
  Filled 2024-02-19 (×2): qty 0.4

## 2024-02-19 MED ORDER — SODIUM CHLORIDE 0.9 % IV BOLUS
1000.0000 mL | Freq: Once | INTRAVENOUS | Status: AC
  Administered 2024-02-19: 1000 mL via INTRAVENOUS

## 2024-02-19 MED ORDER — ALBUTEROL SULFATE (2.5 MG/3ML) 0.083% IN NEBU
2.5000 mg | INHALATION_SOLUTION | Freq: Once | RESPIRATORY_TRACT | Status: AC
  Administered 2024-02-19: 2.5 mg via RESPIRATORY_TRACT
  Filled 2024-02-19: qty 3

## 2024-02-19 MED ORDER — SODIUM CHLORIDE 0.9 % IV SOLN
2.0000 g | INTRAVENOUS | Status: DC
  Administered 2024-02-20 – 2024-02-21 (×2): 2 g via INTRAVENOUS
  Filled 2024-02-19 (×2): qty 20

## 2024-02-19 NOTE — H&P (Signed)
 " History and Physical    Cory Roberts FMW:[id number] DOB: 19-Sep-1960 DOA: 02/19/2024  PCP: Patient, No Pcp Per   Patient coming from: Home  I have personally briefly reviewed patient's old medical records in [facility]  Chief Complaint: Difficulty Breathing  HPI: Cory Roberts is a 64 y.o. male with medical history significant for  stroke.  Patient presented to the ED with complaints of difficulty breathing, cough productive of thick brownish sputum.  Patient was recently admitted 7/12 to 1/13 for acute hypoxic respiratory failure secondary to influenza infection, with possibility of underlying COPD.  Was discharged on Tamiflu , steroids, and cough medications.  Was to follow-up with outpatient pulmonology for PFTs  Patient reports initially he felt better, but symptoms never completely cleared before his symptoms worsened again, and was worse than initial presentation.  No chest pain, no lower extremity swelling.  No coughing or choking or aspiration episodes or problems with swallowing.  No vomiting.  ED Course: Febrile to 100.9.  Heart rate 82-109.  Respiratory rate 13-24.  Blood pressure systolic 103-142.  O2 sats greater than 92% on room air. WBC 19.6. Troponin 18 > 21. Lactic acid 1.4. CTA chest, CT abdomen and pelvis with contrast-  Diffuse clusters of nodular density throughout the lower lobes bilaterally consistent with atypical pneumonia or aspiration.  No acute intra-abdominal pathology, nonobstructing bilateral renal calculi.  No hydronephrosis. IV ceftriaxone  and azithromycin  started, DuoNebs given which significantly helped patient's symptoms, 1.5 L bolus given.   Review of Systems: As per HPI all other systems reviewed and negative.  Past Medical History:  Diagnosis Date   Arthritis 03/2011   Gout- Right foot   COPD (chronic obstructive pulmonary disease) (HCC)    CVA (cerebral infarction)    GERD (gastroesophageal reflux disease)    Gout    Headache    Migraine  Headaches   History of kidney stones    PUD (peptic ulcer disease) 2016   Raynaud's syndrome    Stroke (HCC) 2011    Past Surgical History:  Procedure Laterality Date   COLONOSCOPY N/A 03/22/2015   Procedure: COLONOSCOPY;  Surgeon: Margo LITTIE Haddock, MD;  Location: [facility];  Service: Endoscopy;  Laterality: N/A;  1215   ESOPHAGOGASTRODUODENOSCOPY N/A 12/22/2014   Dr. Haddock: 1. dysphagia due to mid-esophageal web and uncontrolled reflux esophagitis. 2. small hiatal hernia 3. multiple small gastric and duodenal ulcers and mild duodenitis . Path with reactive gastropathy, negative H. pylori    ESOPHAGOGASTRODUODENOSCOPY N/A 03/22/2015   Procedure: ESOPHAGOGASTRODUODENOSCOPY (EGD);  Surgeon: Margo LITTIE Haddock, MD;  Location: [facility];  Service: Endoscopy;  Laterality: N/A;   HERNIA REPAIR Left 2007   INCISION AND DRAINAGE Right 12/18/2014   Procedure: INCISION AND DRAINAGE THUMB;  Surgeon: Taft FORBES Minerva, MD;  Location: [facility];  Service: Orthopedics;  Laterality: Right;   INCISION AND DRAINAGE Right 12/21/2014   Procedure: INCISION AND DRAINAGE RIGHT THUMB;  Surgeon: Taft FORBES Minerva, MD;  Location: [facility];  Service: Orthopedics;  Laterality: Right;   incision and drainage of thumb  12/18/14   ORIF FEMUR FRACTURE Right 10/28/2020   Procedure: OPEN REDUCTION INTERNAL FIXATION (ORIF) DISTAL FEMUR FRACTURE;  Surgeon: Fidel Rogue, MD;  Location: [facility];  Service: Orthopedics;  Laterality: Right;   TOTAL HIP ARTHROPLASTY Right 09/18/2016   TOTAL HIP ARTHROPLASTY Right 09/18/2016   Procedure: TOTAL HIP ARTHROPLASTY ANTERIOR APPROACH;  Surgeon: Fidel Rogue, MD;  Location: [facility];  Service: Orthopedics;  Laterality: Right;  TOTAL HIP ARTHROPLASTY Left 02/26/2017   Procedure: LEFT TOTAL HIP ARTHROPLASTY; ANTERIOR APPROACH;  Surgeon: Fidel Rogue, MD;  Location: [facility];  Service: Orthopedics;  Laterality: Left;     reports that he quit smoking about 19 years ago. His smoking use  included cigarettes. He started smoking about 49 years ago. He has a 90 pack-year smoking history. He has been exposed to tobacco smoke. He has never used smokeless tobacco. He reports that he does not currently use alcohol. He reports that he does not use drugs.  Allergies[1]  Family History  Problem Relation Age of Onset   Cancer Mother    Hyperlipidemia Mother    Hypertension Mother    Heart attack Mother    Rheumatologic disease Mother    Cancer Father    Heart disease Father 26       Heart Disease before age 57   Heart attack Father    Diabetes Sister    Cancer Sister    Hyperlipidemia Sister    Hypertension Sister    Colon cancer Neg Hx      Prior to Admission medications  Medication Sig Start Date End Date Taking? Authorizing Provider  oseltamivir  (TAMIFLU ) 30 MG capsule Take 30 mg by mouth.   Yes [provider]  albuterol  (VENTOLIN  HFA) 108 (90 Base) MCG/ACT inhaler Inhale 2 puffs into the lungs every 6 (six) hours as needed for wheezing or shortness of breath. 02/12/24   Pearlean Manus, MD  budesonide-glycopyrrolate -formoterol (BREZTRI  AEROSPHERE) 160-9-4.8 MCG/ACT AERO inhaler Inhale 2 puffs into the lungs in the morning and at bedtime. 02/12/24   Pearlean Manus, MD  guaiFENesin  (MUCINEX ) 600 MG 12 hr tablet Take 1 tablet (600 mg total) by mouth 2 (two) times daily. 02/12/24   Pearlean Manus, MD  pantoprazole  (PROTONIX ) 40 MG tablet Take 1 tablet (40 mg total) by mouth daily before breakfast. 02/12/24   Pearlean Manus, MD    Physical Exam: Vitals:   02/19/24 1400 02/19/24 1415 02/19/24 1500 02/19/24 1515  BP: (!) 111/59 (!) 107/57 117/71 (!) 103/54  Pulse: 93 91 87 87  Resp: (!) 22 18 16 14   Temp:      TempSrc:      SpO2: 91% 92% 92% 95%  Weight:      Height:        Constitutional: NAD, calm, comfortable Vitals:   02/19/24 1400 02/19/24 1415 02/19/24 1500 02/19/24 1515  BP: (!) 111/59 (!) 107/57 117/71 (!) 103/54  Pulse: 93 91 87 87  Resp: (!)  22 18 16 14   Temp:      TempSrc:      SpO2: 91% 92% 92% 95%  Weight:      Height:       Eyes: PERRL, lids and conjunctivae normal ENMT: Mucous membranes are moist.  Neck: normal, supple, no masses, no thyromegaly Respiratory: clear to auscultation bilaterally, no wheezing, no crackles. Normal respiratory effort. No accessory muscle use.  Cardiovascular: Regular rate and rhythm, no murmurs / rubs / gallops. No extremity edema.  Extremities warm Abdomen: no tenderness, no masses palpated. No hepatosplenomegaly.   Musculoskeletal: no clubbing / cyanosis. No joint deformity upper and lower extremities.  Skin: no rashes, lesions, ulcers. No induration Neurologic: No facial asymmetry, moves extremity spontaneously, speech fluent. Psychiatric: Normal judgment and insight. Alert and oriented x 3. Normal mood.   Labs on Admission: I have personally reviewed following labs and imaging studies  CBC: Recent Labs  Lab 02/19/24 1101  WBC 19.6*  NEUTROABS  16.7*  HGB 15.9  HCT 46.1  MCV 95.1  PLT 455*   Basic Metabolic Panel: Recent Labs  Lab 02/19/24 1101  NA 136  K 3.6  CL 96*  CO2 27  GLUCOSE 120*  BUN 18  CREATININE 1.26*  CALCIUM  9.5   GFR: Estimated Creatinine Clearance: 61.1 mL/min (A) (by C-G formula based on SCr of 1.26 mg/dL (H)). Liver Function Tests: Recent Labs  Lab 02/19/24 1101  AST 42*  ALT 66*  ALKPHOS 163*  BILITOT 1.4*  PROT 7.7  ALBUMIN 3.9   Urine analysis:    Component Value Date/Time   COLORURINE YELLOW 02/19/2024 1205   APPEARANCEUR CLEAR 02/19/2024 1205   LABSPEC 1.020 02/19/2024 1205   PHURINE 5.0 02/19/2024 1205   GLUCOSEU NEGATIVE 02/19/2024 1205   HGBUR NEGATIVE 02/19/2024 1205   BILIRUBINUR NEGATIVE 02/19/2024 1205   KETONESUR NEGATIVE 02/19/2024 1205   PROTEINUR 30 (A) 02/19/2024 1205   NITRITE NEGATIVE 02/19/2024 1205   LEUKOCYTESUR NEGATIVE 02/19/2024 1205    Radiological Exams on Admission: CT Angio Chest PE W and/or Wo  Contrast Result Date: 02/19/2024 CLINICAL DATA:  Fever.  Concern for pulmonary embolism.  Flank pain. EXAM: CT ANGIOGRAPHY CHEST CT ABDOMEN AND PELVIS WITH CONTRAST TECHNIQUE: Multidetector CT imaging of the chest was performed using the standard protocol during bolus administration of intravenous contrast. Multiplanar CT image reconstructions and MIPs were obtained to evaluate the vascular anatomy. Multidetector CT imaging of the abdomen and pelvis was performed using the standard protocol during bolus administration of intravenous contrast. RADIATION DOSE REDUCTION: This exam was performed according to the departmental dose-optimization program which includes automated exposure control, adjustment of the mA and/or kV according to patient size and/or use of iterative reconstruction technique. CONTRAST:  OMNIPAQUE  IOHEXOL  350 MG/ML SOLN COMPARISON:  Chest CT dated 02/11/2024 and CT abdomen pelvis dated 09/17/2019. FINDINGS: CTA CHEST FINDINGS Cardiovascular: There is no cardiomegaly or pericardial effusion. There is coronary vascular calcification of the LAD. Mild atherosclerotic calcification of the aortic arch. No aneurysmal dilatation or dissection. The origins of the great vessels of the aortic arch appear patent. Evaluation of the pulmonary arteries is limited due to respiratory motion and suboptimal visualization of the peripheral branches. No pulmonary artery embolus identified. Mediastinum/Nodes: Mildly enlarged left hilar and mediastinal lymph nodes measure up to 12 mm short axis in the subcarinal and paratracheal region. The esophagus is grossly normal. No mediastinal fluid collection. Lungs/Pleura: Diffuse clusters of nodular density throughout the lower lobes bilaterally consistent with atypical pneumonia or aspiration. Clinical correlation is recommended. Scattered bilateral subpleural reticular densities likely sequela of an inflammatory/infectious etiology. No consolidative changes. There is no  pleural effusion or pneumothorax. There is diffuse thickening of the lower lobe bronchi. The central airways are patent. Musculoskeletal: No acute osseous pathology. Review of the MIP images confirms the above findings. CT ABDOMEN and PELVIS FINDINGS No intra-abdominal free air or free fluid. Hepatobiliary: The liver is unremarkable. No biliary ductal dilatation. The gallbladder is unremarkable. Pancreas: Unremarkable. No pancreatic ductal dilatation or surrounding inflammatory changes. Spleen: Normal in size without focal abnormality. Adrenals/Urinary Tract: The adrenal glands unremarkable. Nonobstructing right renal calculi measure up to 5 mm. A tiny nonobstructing left renal upper pole calculus is also suspected. There is no hydronephrosis on either side. There is symmetric enhancement and excretion of contrast by both kidneys. The visualized ureters appear unremarkable. The urinary bladder is poorly visualized due to streak artifact caused by bilateral hip arthroplasties. Stomach/Bowel: There is no bowel obstruction or  active inflammation. The appendix is normal. Vascular/Lymphatic: Moderate aortoiliac atherosclerotic disease. The IVC is unremarkable. No portal venous gas. There is no adenopathy. Reproductive: The prostate gland is not visualized. Other: None Musculoskeletal: There is osteopenia with degenerative changes of the spine. Bilateral total hip arthroplasties. No acute osseous pathology. Review of the MIP images confirms the above findings. IMPRESSION: 1. No CT evidence of pulmonary artery embolus. 2. Diffuse clusters of nodular density throughout the lower lobes bilaterally consistent with atypical pneumonia or aspiration. 3. No acute intra-abdominal or pelvic pathology. 4. Nonobstructing bilateral renal calculi. No hydronephrosis. 5. No bowel obstruction. Normal appendix. 6.  Aortic Atherosclerosis (ICD10-I70.0). Electronically Signed   By: Vanetta Chou M.D.   On: 02/19/2024 15:23   CT ABDOMEN  PELVIS W CONTRAST Result Date: 02/19/2024 CLINICAL DATA:  Fever.  Concern for pulmonary embolism.  Flank pain. EXAM: CT ANGIOGRAPHY CHEST CT ABDOMEN AND PELVIS WITH CONTRAST TECHNIQUE: Multidetector CT imaging of the chest was performed using the standard protocol during bolus administration of intravenous contrast. Multiplanar CT image reconstructions and MIPs were obtained to evaluate the vascular anatomy. Multidetector CT imaging of the abdomen and pelvis was performed using the standard protocol during bolus administration of intravenous contrast. RADIATION DOSE REDUCTION: This exam was performed according to the departmental dose-optimization program which includes automated exposure control, adjustment of the mA and/or kV according to patient size and/or use of iterative reconstruction technique. CONTRAST:  OMNIPAQUE  IOHEXOL  350 MG/ML SOLN COMPARISON:  Chest CT dated 02/11/2024 and CT abdomen pelvis dated 09/17/2019. FINDINGS: CTA CHEST FINDINGS Cardiovascular: There is no cardiomegaly or pericardial effusion. There is coronary vascular calcification of the LAD. Mild atherosclerotic calcification of the aortic arch. No aneurysmal dilatation or dissection. The origins of the great vessels of the aortic arch appear patent. Evaluation of the pulmonary arteries is limited due to respiratory motion and suboptimal visualization of the peripheral branches. No pulmonary artery embolus identified. Mediastinum/Nodes: Mildly enlarged left hilar and mediastinal lymph nodes measure up to 12 mm short axis in the subcarinal and paratracheal region. The esophagus is grossly normal. No mediastinal fluid collection. Lungs/Pleura: Diffuse clusters of nodular density throughout the lower lobes bilaterally consistent with atypical pneumonia or aspiration. Clinical correlation is recommended. Scattered bilateral subpleural reticular densities likely sequela of an inflammatory/infectious etiology. No consolidative changes.  There is no pleural effusion or pneumothorax. There is diffuse thickening of the lower lobe bronchi. The central airways are patent. Musculoskeletal: No acute osseous pathology. Review of the MIP images confirms the above findings. CT ABDOMEN and PELVIS FINDINGS No intra-abdominal free air or free fluid. Hepatobiliary: The liver is unremarkable. No biliary ductal dilatation. The gallbladder is unremarkable. Pancreas: Unremarkable. No pancreatic ductal dilatation or surrounding inflammatory changes. Spleen: Normal in size without focal abnormality. Adrenals/Urinary Tract: The adrenal glands unremarkable. Nonobstructing right renal calculi measure up to 5 mm. A tiny nonobstructing left renal upper pole calculus is also suspected. There is no hydronephrosis on either side. There is symmetric enhancement and excretion of contrast by both kidneys. The visualized ureters appear unremarkable. The urinary bladder is poorly visualized due to streak artifact caused by bilateral hip arthroplasties. Stomach/Bowel: There is no bowel obstruction or active inflammation. The appendix is normal. Vascular/Lymphatic: Moderate aortoiliac atherosclerotic disease. The IVC is unremarkable. No portal venous gas. There is no adenopathy. Reproductive: The prostate gland is not visualized. Other: None Musculoskeletal: There is osteopenia with degenerative changes of the spine. Bilateral total hip arthroplasties. No acute osseous pathology. Review of the MIP images  confirms the above findings. IMPRESSION: 1. No CT evidence of pulmonary artery embolus. 2. Diffuse clusters of nodular density throughout the lower lobes bilaterally consistent with atypical pneumonia or aspiration. 3. No acute intra-abdominal or pelvic pathology. 4. Nonobstructing bilateral renal calculi. No hydronephrosis. 5. No bowel obstruction. Normal appendix. 6.  Aortic Atherosclerosis (ICD10-I70.0). Electronically Signed   By: Vanetta Chou M.D.   On: 02/19/2024 15:23    DG Chest Port 1 View Result Date: 02/19/2024 CLINICAL DATA:  Shortness of breath, productive cough EXAM: PORTABLE CHEST 1 VIEW COMPARISON:  February 11, 2024 FINDINGS: The heart size and mediastinal contours are within normal limits. No acute pulmonary abnormality is noted. The visualized skeletal structures are unremarkable. IMPRESSION: No active disease. Electronically Signed   By: Lynwood Landy Raddle M.D.   On: 02/19/2024 11:29   EKG: Independently reviewed.  Sinus rhythm, rate 106, QTc 404.  No significant change from prior.  Assessment/Plan Principal Problem:   PNA (pneumonia) Active Problems:   PUD (peptic ulcer disease)   Assessment and Plan:  Pneumonia-persistent and worsening dyspnea and productive cough.  Not hypoxic.  Meet sepsis criteria with fever of 100.9, tachycardia heart rate 85-109, leukocytosis of 19.6.  Lactic acid normal at 1.4. Recent hospitalization for influenza bronchitis, was treated with Tamiflu  and steroids.  He denies any problems with swallowing. - CTA chest-diffuse clusters of nodular densities throughout the lower lobes bilaterally consistent with atypical pneumonia or aspiration. -COVID influenza RSV negative. - IV ceftriaxone  and azithromycin  - DuoNebs as needed -Mucolytics -1.5 L bolus given -Follow-up blood cultures -There was a consideration for possible undiagnosed COPD during recent hospitalization and was to follow-up with outpatient pulmonology for PFTs. He quit smoking 20 years ago and prior to recent influenza illness, he denies any chronic lung symptoms.   GERD, history of peptic ulcer disease - Resume Protonix   DVT prophylaxis: Lovenox  Code Status: Full code Family Communication: Spouse- Cathy bedside, she is a Psychologist, sport and exercise here on the floor at [employer]. Disposition Plan: ~ 2 days Consults called: None  Admission status: Inpt Tele  I certify that at the point of admission it is my clinical judgment that the patient will require inpatient  hospital care spanning beyond 2 midnights from the point of admission due to high intensity of service, high risk for further deterioration and high frequency of surveillance required.   Author: Tully FORBES Carwin, MD 02/19/2024 5:04 PM  For on call review [email or web address].     [1]  Allergies Allergen Reactions   Codeine Nausea And Vomiting   Simvastatin  Other (See Comments)    simvastatin    Colchicine Diarrhea   Hydrocodone  Nausea Only   Pholcodine Other (See Comments)   "

## 2024-02-19 NOTE — ED Provider Notes (Signed)
 " [place] EMERGENCY DEPARTMENT AT [facility] Provider Note   CSN: [id number] Arrival date & time: 02/19/24  1007     Patient presents with: Shortness of Breath   Cory Roberts is a 64 y.o. male.   HPI 64 year old male presents with shortness of breath.  He was admitted on 1/12 and discharged on 1/13 for COPD exacerbation and the flu.  He was discharged with a course of steroids, which she finished 4 days ago.  However about 5 days ago he started feeling like he was getting worse and had never quite recovered.  He is having a lot of chest pressure/heaviness, productive cough with sputum, and is feeling hot and cold at home.  He was found to be febrile here to 100.9.  He feels worse than when he was here last week.  No leg swelling or vomiting. He's having pain in his right lower ribs/flank.  Prior to Admission medications  Medication Sig Start Date End Date Taking? Authorizing Provider  oseltamivir  (TAMIFLU ) 30 MG capsule Take 30 mg by mouth.   Yes [provider]  albuterol  (VENTOLIN  HFA) 108 (90 Base) MCG/ACT inhaler Inhale 2 puffs into the lungs every 6 (six) hours as needed for wheezing or shortness of breath. 02/12/24   Pearlean Manus, MD  budesonide-glycopyrrolate -formoterol (BREZTRI  AEROSPHERE) 160-9-4.8 MCG/ACT AERO inhaler Inhale 2 puffs into the lungs in the morning and at bedtime. 02/12/24   Pearlean Manus, MD  guaiFENesin  (MUCINEX ) 600 MG 12 hr tablet Take 1 tablet (600 mg total) by mouth 2 (two) times daily. 02/12/24   Pearlean Manus, MD  pantoprazole  (PROTONIX ) 40 MG tablet Take 1 tablet (40 mg total) by mouth daily before breakfast. 02/12/24   Pearlean Manus, MD    Allergies: Codeine, Simvastatin , Colchicine, Hydrocodone , and Pholcodine    Review of Systems  Constitutional:  Positive for chills and fever.  Respiratory:  Positive for cough and shortness of breath.   Cardiovascular:  Positive for chest pain.  Gastrointestinal:  Positive for nausea.  Negative for vomiting.    Updated Vital Signs BP (!) 103/54   Pulse 87   Temp (!) 100.9 F (38.3 C) (Temporal)   Resp 14   Ht 5' 8 (1.727 m)   Wt 82.1 kg   SpO2 95%   BMI 27.52 kg/m   Physical Exam Vitals and nursing note reviewed.  Constitutional:      Appearance: He is well-developed.  HENT:     Head: Normocephalic and atraumatic.  Cardiovascular:     Rate and Rhythm: Normal rate and regular rhythm.     Heart sounds: Normal heart sounds.  Pulmonary:     Effort: Pulmonary effort is normal. No accessory muscle usage or respiratory distress.     Breath sounds: Wheezing present.  Abdominal:     Palpations: Abdomen is soft.     Tenderness: There is no abdominal tenderness.   Musculoskeletal:     Right lower leg: No edema.     Left lower leg: No edema.  Skin:    General: Skin is warm and dry.  Neurological:     Mental Status: He is alert.     (all labs ordered are listed, but only abnormal results are displayed) Labs Reviewed  COMPREHENSIVE METABOLIC PANEL WITH GFR - Abnormal; Notable for the following components:      Result Value   Chloride 96 (*)    Glucose, Bld 120 (*)    Creatinine, Ser 1.26 (*)    AST 42 (*)  ALT 66 (*)    Alkaline Phosphatase 163 (*)    Total Bilirubin 1.4 (*)    All other components within normal limits  CBC WITH DIFFERENTIAL/PLATELET - Abnormal; Notable for the following components:   WBC 19.6 (*)    Platelets 455 (*)    Neutro Abs 16.7 (*)    Monocytes Absolute 1.1 (*)    Abs Immature Granulocytes 0.16 (*)    All other components within normal limits  URINALYSIS, W/ REFLEX TO CULTURE (INFECTION SUSPECTED) - Abnormal; Notable for the following components:   Protein, ur 30 (*)    All other components within normal limits  TROPONIN T, HIGH SENSITIVITY - Abnormal; Notable for the following components:   Troponin T High Sensitivity 21 (*)    All other components within normal limits  RESP PANEL BY RT-PCR (RSV, FLU A&B, COVID)   RVPGX2  CULTURE, BLOOD (SINGLE)  CULTURE, BLOOD (ROUTINE X 2)  LACTIC ACID, PLASMA  TROPONIN T, HIGH SENSITIVITY    EKG: EKG Interpretation Date/Time:  Tuesday February 19 2024 10:59:22 EST Ventricular Rate:  106 PR Interval:  147 QRS Duration:  84 QT Interval:  304 QTC Calculation: 404 R Axis:   29  Text Interpretation: Sinus tachycardia nonspecific changes similar to Feb 11 2024 Confirmed by Freddi Hamilton [phone]) on 02/19/2024 11:07:09 AM  Radiology: CT Angio Chest PE W and/or Wo Contrast Result Date: 02/19/2024 CLINICAL DATA:  Fever.  Concern for pulmonary embolism.  Flank pain. EXAM: CT ANGIOGRAPHY CHEST CT ABDOMEN AND PELVIS WITH CONTRAST TECHNIQUE: Multidetector CT imaging of the chest was performed using the standard protocol during bolus administration of intravenous contrast. Multiplanar CT image reconstructions and MIPs were obtained to evaluate the vascular anatomy. Multidetector CT imaging of the abdomen and pelvis was performed using the standard protocol during bolus administration of intravenous contrast. RADIATION DOSE REDUCTION: This exam was performed according to the departmental dose-optimization program which includes automated exposure control, adjustment of the mA and/or kV according to patient size and/or use of iterative reconstruction technique. CONTRAST:  OMNIPAQUE  IOHEXOL  350 MG/ML SOLN COMPARISON:  Chest CT dated 02/11/2024 and CT abdomen pelvis dated 09/17/2019. FINDINGS: CTA CHEST FINDINGS Cardiovascular: There is no cardiomegaly or pericardial effusion. There is coronary vascular calcification of the LAD. Mild atherosclerotic calcification of the aortic arch. No aneurysmal dilatation or dissection. The origins of the great vessels of the aortic arch appear patent. Evaluation of the pulmonary arteries is limited due to respiratory motion and suboptimal visualization of the peripheral branches. No pulmonary artery embolus identified. Mediastinum/Nodes: Mildly  enlarged left hilar and mediastinal lymph nodes measure up to 12 mm short axis in the subcarinal and paratracheal region. The esophagus is grossly normal. No mediastinal fluid collection. Lungs/Pleura: Diffuse clusters of nodular density throughout the lower lobes bilaterally consistent with atypical pneumonia or aspiration. Clinical correlation is recommended. Scattered bilateral subpleural reticular densities likely sequela of an inflammatory/infectious etiology. No consolidative changes. There is no pleural effusion or pneumothorax. There is diffuse thickening of the lower lobe bronchi. The central airways are patent. Musculoskeletal: No acute osseous pathology. Review of the MIP images confirms the above findings. CT ABDOMEN and PELVIS FINDINGS No intra-abdominal free air or free fluid. Hepatobiliary: The liver is unremarkable. No biliary ductal dilatation. The gallbladder is unremarkable. Pancreas: Unremarkable. No pancreatic ductal dilatation or surrounding inflammatory changes. Spleen: Normal in size without focal abnormality. Adrenals/Urinary Tract: The adrenal glands unremarkable. Nonobstructing right renal calculi measure up to 5 mm. A tiny nonobstructing left renal  upper pole calculus is also suspected. There is no hydronephrosis on either side. There is symmetric enhancement and excretion of contrast by both kidneys. The visualized ureters appear unremarkable. The urinary bladder is poorly visualized due to streak artifact caused by bilateral hip arthroplasties. Stomach/Bowel: There is no bowel obstruction or active inflammation. The appendix is normal. Vascular/Lymphatic: Moderate aortoiliac atherosclerotic disease. The IVC is unremarkable. No portal venous gas. There is no adenopathy. Reproductive: The prostate gland is not visualized. Other: None Musculoskeletal: There is osteopenia with degenerative changes of the spine. Bilateral total hip arthroplasties. No acute osseous pathology. Review of the  MIP images confirms the above findings. IMPRESSION: 1. No CT evidence of pulmonary artery embolus. 2. Diffuse clusters of nodular density throughout the lower lobes bilaterally consistent with atypical pneumonia or aspiration. 3. No acute intra-abdominal or pelvic pathology. 4. Nonobstructing bilateral renal calculi. No hydronephrosis. 5. No bowel obstruction. Normal appendix. 6.  Aortic Atherosclerosis (ICD10-I70.0). Electronically Signed   By: Vanetta Chou M.D.   On: 02/19/2024 15:23   CT ABDOMEN PELVIS W CONTRAST Result Date: 02/19/2024 CLINICAL DATA:  Fever.  Concern for pulmonary embolism.  Flank pain. EXAM: CT ANGIOGRAPHY CHEST CT ABDOMEN AND PELVIS WITH CONTRAST TECHNIQUE: Multidetector CT imaging of the chest was performed using the standard protocol during bolus administration of intravenous contrast. Multiplanar CT image reconstructions and MIPs were obtained to evaluate the vascular anatomy. Multidetector CT imaging of the abdomen and pelvis was performed using the standard protocol during bolus administration of intravenous contrast. RADIATION DOSE REDUCTION: This exam was performed according to the departmental dose-optimization program which includes automated exposure control, adjustment of the mA and/or kV according to patient size and/or use of iterative reconstruction technique. CONTRAST:  OMNIPAQUE  IOHEXOL  350 MG/ML SOLN COMPARISON:  Chest CT dated 02/11/2024 and CT abdomen pelvis dated 09/17/2019. FINDINGS: CTA CHEST FINDINGS Cardiovascular: There is no cardiomegaly or pericardial effusion. There is coronary vascular calcification of the LAD. Mild atherosclerotic calcification of the aortic arch. No aneurysmal dilatation or dissection. The origins of the great vessels of the aortic arch appear patent. Evaluation of the pulmonary arteries is limited due to respiratory motion and suboptimal visualization of the peripheral branches. No pulmonary artery embolus identified.  Mediastinum/Nodes: Mildly enlarged left hilar and mediastinal lymph nodes measure up to 12 mm short axis in the subcarinal and paratracheal region. The esophagus is grossly normal. No mediastinal fluid collection. Lungs/Pleura: Diffuse clusters of nodular density throughout the lower lobes bilaterally consistent with atypical pneumonia or aspiration. Clinical correlation is recommended. Scattered bilateral subpleural reticular densities likely sequela of an inflammatory/infectious etiology. No consolidative changes. There is no pleural effusion or pneumothorax. There is diffuse thickening of the lower lobe bronchi. The central airways are patent. Musculoskeletal: No acute osseous pathology. Review of the MIP images confirms the above findings. CT ABDOMEN and PELVIS FINDINGS No intra-abdominal free air or free fluid. Hepatobiliary: The liver is unremarkable. No biliary ductal dilatation. The gallbladder is unremarkable. Pancreas: Unremarkable. No pancreatic ductal dilatation or surrounding inflammatory changes. Spleen: Normal in size without focal abnormality. Adrenals/Urinary Tract: The adrenal glands unremarkable. Nonobstructing right renal calculi measure up to 5 mm. A tiny nonobstructing left renal upper pole calculus is also suspected. There is no hydronephrosis on either side. There is symmetric enhancement and excretion of contrast by both kidneys. The visualized ureters appear unremarkable. The urinary bladder is poorly visualized due to streak artifact caused by bilateral hip arthroplasties. Stomach/Bowel: There is no bowel obstruction or active inflammation. The appendix  is normal. Vascular/Lymphatic: Moderate aortoiliac atherosclerotic disease. The IVC is unremarkable. No portal venous gas. There is no adenopathy. Reproductive: The prostate gland is not visualized. Other: None Musculoskeletal: There is osteopenia with degenerative changes of the spine. Bilateral total hip arthroplasties. No acute osseous  pathology. Review of the MIP images confirms the above findings. IMPRESSION: 1. No CT evidence of pulmonary artery embolus. 2. Diffuse clusters of nodular density throughout the lower lobes bilaterally consistent with atypical pneumonia or aspiration. 3. No acute intra-abdominal or pelvic pathology. 4. Nonobstructing bilateral renal calculi. No hydronephrosis. 5. No bowel obstruction. Normal appendix. 6.  Aortic Atherosclerosis (ICD10-I70.0). Electronically Signed   By: Vanetta Chou M.D.   On: 02/19/2024 15:23   DG Chest Port 1 View Result Date: 02/19/2024 CLINICAL DATA:  Shortness of breath, productive cough EXAM: PORTABLE CHEST 1 VIEW COMPARISON:  February 11, 2024 FINDINGS: The heart size and mediastinal contours are within normal limits. No acute pulmonary abnormality is noted. The visualized skeletal structures are unremarkable. IMPRESSION: No active disease. Electronically Signed   By: Lynwood Landy Raddle M.D.   On: 02/19/2024 11:29     .Critical Care  Performed by: Freddi Hamilton, MD Authorized by: Freddi Hamilton, MD   Critical care provider statement:    Critical care time (minutes):  30   Critical care time was exclusive of:  Separately billable procedures and treating other patients   Critical care was necessary to treat or prevent imminent or life-threatening deterioration of the following conditions:  Respiratory failure and sepsis   Critical care was time spent personally by me on the following activities:  Development of treatment plan with patient or surrogate, discussions with consultants, evaluation of patient's response to treatment, examination of patient, ordering and review of laboratory studies, ordering and review of radiographic studies, ordering and performing treatments and interventions, pulse oximetry, re-evaluation of patient's condition and review of old charts    Medications Ordered in the ED  cefTRIAXone  (ROCEPHIN ) 1 g in sodium chloride  0.9 % 100 mL IVPB (has no  administration in time range)  azithromycin  (ZITHROMAX ) tablet 500 mg (has no administration in time range)  sodium chloride  0.9 % bolus 500 mL (has no administration in time range)  acetaminophen  (TYLENOL ) tablet 650 mg (650 mg Oral Given 02/19/24 1157)  sodium chloride  0.9 % bolus 1,000 mL ( Intravenous Stopped 02/19/24 1307)  ipratropium-albuterol  (DUONEB) 0.5-2.5 (3) MG/3ML nebulizer solution 3 mL (3 mLs Nebulization Given 02/19/24 1218)  albuterol  (PROVENTIL ) (2.5 MG/3ML) 0.083% nebulizer solution 2.5 mg (2.5 mg Nebulization Given 02/19/24 1218)  iohexol  (OMNIPAQUE ) 350 MG/ML injection 100 mL (100 mLs Intravenous Contrast Given 02/19/24 1422)                                    Medical Decision Making Amount and/or Complexity of Data Reviewed Labs: ordered.    Details: Leukocytosis of 19 Radiology: ordered and independent interpretation performed.    Details: Lower lung infiltrates ECG/medicine tests: ordered and independent interpretation performed.    Details: Tachycardia  Risk OTC drugs. Prescription drug management. Decision regarding hospitalization.   Patient presents with worsening respiratory symptoms.  Here for the flu last week.  Appears to have pneumonia on workup.  He has some soft blood pressures and was given some fluids.  Started on IV Rocephin  and oral azithromycin  (due to backorder issues).  He is not in distress but does meet some criteria for sepsis with  his white count and fever.  Will observe in the hospital. Discussed with Dr. Pearlean for admission.     Final diagnoses:  Pneumonia of both lower lobes due to infectious organism    ED Discharge Orders     None          Freddi Hamilton, MD 02/19/24 1553  "

## 2024-02-19 NOTE — ED Triage Notes (Signed)
 Pt arrived via POV c/o SOB, productive cough and reports recently being Dx with COPD and the Flu. Pt reports he has pulmonology appointment scheduled but cannot see him until next month. Pt also reports heaviness in chest and endorses feeling nausea.

## 2024-02-19 NOTE — Telephone Encounter (Signed)
 FYI Only or Action Required?: FYI only for provider: ED advised.  Patient is followed in Pulmonology for , last seen on  Called Nurse Triage reporting Breathing Problem.  Symptoms began several weeks ago.  Interventions attempted: Prescription medications: Mucinex , Rescue inhaler, and Maintenance inhaler.  Symptoms are: gradually worsening.  Triage Disposition: Go to ED Now (or PCP Triage)  Patient/caregiver understands and will follow disposition?: Yes  E2C2 Pulmonary Triage - Initial Assessment Questions Chief Complaint (e.g., cough, sob, wheezing, fever, chills, sweat or additional symptoms) *Go to specific symptom protocol after initial questions. SOB  How long have symptoms been present? Over 1 week  Have you tested for COVID or Flu? Note: If not, ask patient if a home test can be taken. If so, instruct patient to call back for positive results. No  MEDICINES:   Have you used any OTC meds to help with symptoms? Yes If yes, ask What medications? Mucinex   Have you used your inhalers/maintenance medication? Yes If yes, What medications? Breztri  and Albuterol   If inhaler, ask How many puffs and how often? Note: Review instructions on medication in the chart. Not helping   OXYGEN: Do you wear supplemental oxygen? No If yes, How many liters are you supposed to use?   Do you monitor your oxygen levels? Yes If yes, What is your reading (oxygen level) today? 83-87% RA  What is your usual oxygen saturation reading?  (Note: Pulmonary O2 sats should be 90% or greater) > 90% Message from [place] C sent at 02/19/2024  8:48 AM EST  Reason for Triage: Patient's spouse Donny [phone] asked if patient can be seen sooner 03/05/24 at 9 am with Dr. Darlean ([facility]). Patient is gettting worse, coughing more constantly- phlegm dark almost black, wheezing, shortness of breath, feels like elephant on the chest, and feels like someone is sticking a knife into his chest,  and needs be morning appointment. Patient does not have a fever, but feels like freezing to burning up. Patient denies dizziness. Please advise.   Reason for Disposition  [1] MODERATE difficulty breathing (e.g., speaks in phrases, SOB even at rest, pulse 100-120) AND [2] WORSE than when discharged from hospital  Answer Assessment - Initial Assessment Questions Wife states pt has gotten worse since d/c from ED on 02/12/24.   1. MAIN CONCERN OR SYMPTOM: What's the main symptom you're worried about? (e.g., breathing problem, cough, sputum change, wheezing) What question do you have?     SOB 2. ONSET: When did the  sx  start?      Before going into hospital on 02/11/24 3. BETTER-SAME-WORSE: Are you getting better, staying the same, or getting worse compared to the day you were discharged?     worse 4. DISCHARGE DATE: What date were you discharged from the hospital?     02/12/24 6. DISCHARGE APPOINTMENT: Have you scheduled a follow-up discharge appointment with your doctor?     03/05/24 7. DISCHARGE MEDICINES: Did the doctor who discharged you order any new medicines for you to use? If yes, have you filled the prescription and started taking the medicine?      Mucinex  and inhalers but not helping 8. OXYGEN THERAPY:      no 9. MONITORING Do you monitor/measure your oxygen level or vital signs (e.g., yes, no, measurements are automatically sent to call center). Document current and normal values if available.       83-87% RA  10. BREATHING DIFFICULTY: Are you having any trouble breathing? If Yes, ask: How bad  is it?  (e.g., none, mild, moderate, severe)        SOB  11. FEVER: Do you have a fever? If Yes, ask: What is your temperature, how was it measured, and when did it start?       No but feels feverish  12. SPUTUM: Describe the color of your sputum (phlegm). (clear, white, yellow, green, blood-tinged)       Dark colored, thick 13. OTHER SYMPTOMS: Do you have any other  symptoms? (e.g., fever)       wheezing 14. SMOKING: Do you smoke currently?       no  Protocols used: COPD Post-Hospitalization Follow-up Call-[facility]

## 2024-02-20 DIAGNOSIS — A419 Sepsis, unspecified organism: Secondary | ICD-10-CM | POA: Diagnosis not present

## 2024-02-20 DIAGNOSIS — J181 Lobar pneumonia, unspecified organism: Secondary | ICD-10-CM

## 2024-02-20 DIAGNOSIS — K279 Peptic ulcer, site unspecified, unspecified as acute or chronic, without hemorrhage or perforation: Secondary | ICD-10-CM | POA: Diagnosis not present

## 2024-02-20 LAB — BASIC METABOLIC PANEL WITH GFR
Anion gap: 14 (ref 5–15)
BUN: 14 mg/dL (ref 8–23)
CO2: 21 mmol/L — ABNORMAL LOW (ref 22–32)
Calcium: 8.6 mg/dL — ABNORMAL LOW (ref 8.9–10.3)
Chloride: 103 mmol/L (ref 98–111)
Creatinine, Ser: 0.94 mg/dL (ref 0.61–1.24)
GFR, Estimated: >60 mL/min
Glucose, Bld: 100 mg/dL — ABNORMAL HIGH (ref 70–99)
Potassium: 3.5 mmol/L (ref 3.5–5.1)
Sodium: 137 mmol/L (ref 135–145)

## 2024-02-20 LAB — CBC
HCT: 36.9 % — ABNORMAL LOW (ref 39.0–52.0)
Hemoglobin: 12.6 g/dL — ABNORMAL LOW (ref 13.0–17.0)
MCH: 32.8 pg (ref 26.0–34.0)
MCHC: 34.1 g/dL (ref 30.0–36.0)
MCV: 96.1 fL (ref 80.0–100.0)
Platelets: 325 K/uL (ref 150–400)
RBC: 3.84 MIL/uL — ABNORMAL LOW (ref 4.22–5.81)
RDW: 12.6 % (ref 11.5–15.5)
WBC: 14.6 K/uL — ABNORMAL HIGH (ref 4.0–10.5)
nRBC: 0 % (ref 0.0–0.2)

## 2024-02-20 MED ORDER — IPRATROPIUM-ALBUTEROL 0.5-2.5 (3) MG/3ML IN SOLN
3.0000 mL | Freq: Three times a day (TID) | RESPIRATORY_TRACT | Status: DC
  Administered 2024-02-20 – 2024-02-22 (×5): 3 mL via RESPIRATORY_TRACT
  Filled 2024-02-20 (×5): qty 3

## 2024-02-20 MED ORDER — IPRATROPIUM-ALBUTEROL 0.5-2.5 (3) MG/3ML IN SOLN: 3.0000 mL | Freq: Three times a day (TID) | RESPIRATORY_TRACT | Status: DC

## 2024-02-20 MED ORDER — HYDROCODONE BIT-HOMATROP MBR 5-1.5 MG/5ML PO SOLN
5.0000 mL | ORAL | Status: DC | PRN
  Administered 2024-02-21 – 2024-02-22 (×2): 5 mL via ORAL
  Filled 2024-02-20 (×2): qty 5

## 2024-02-20 NOTE — Plan of Care (Signed)

## 2024-02-20 NOTE — Progress Notes (Signed)
" °   02/20/24 1055  TOC Brief Assessment  Insurance and Status Reviewed  Patient has primary care physician No (PCP list added to AVS)  Home environment has been reviewed From Home  Prior level of function: Independent  Prior/Current Home Services No current home services  Social Drivers of Health Review SDOH reviewed no interventions necessary  Readmission risk has been reviewed Yes  Transition of care needs no transition of care needs at this time   Inpatient Care Management (ICM) has reviewed patient and no other ICM needs have been identified at this time. We will continue to monitor patient advancement through interdisciplinary progression rounds. If new patient transition needs arise, please place a ICM consult. "

## 2024-02-20 NOTE — Progress Notes (Signed)
 "          PROGRESS NOTE  Cory Roberts FMW:[id number] DOB: 1961-01-11 DOA: 02/19/2024 PCP: Patient, No Pcp Per  Brief History:  64 year old male with a history of CVA, GERD, migraine, PUD, Raynaud's phenomenon presenting with shortness of breath, coughing, chest congestion, and brown sputum for the past 4 days. In the ED, the patient had temperature of 100.9 F with tachycardia up to 110.  He was hemodynamically stable.  Oxygen saturation was 92-94% on room air.. WBC 19.6, hemoglobin 15.9, platelet 455.  Sodium 137, potassium 3.5, bicarbonate 21, serum creatinine 0.94. CTA chest was negative for PE but showed diffuse clustered nodular density in the bilateral lower lobes.  There is nonobstructive bilateral renal calculi.  CT abdomen and pelvis was negative for any acute findings.  Notably, the patient had a recent hospitalization from 02/11/2024 to 02/12/2024 when he was treated for acute respiratory failure secondary to influenza.  He was weaned off of supplemental oxygen and discharged on room air.  He was instructed to follow-up with pulmonologist regarding emphysema noted on his CT chest.  He was discharged to finish a course of steroids, oseltamavir, and antitussive  medications.   Assessment/Plan: Lobar pneumonia -02/19/2024 CTA chest--negative PE, diffuse cluster of nodular densities bilateral lower lobe - Continue ceftriaxone  and doxycycline  - COVID/RSV/influenza neg - Start Hycodan -There was a consideration for possible undiagnosed COPD during recent hospitalization and was to follow-up with outpatient pulmonology for PFTs. He quit smoking 20 years ago after 35 pack years  Sepsis - Present on admission - Presented with fever, tachycardia, tachypnea, leukocytosis - Secondary to pneumonia - UA 0-5 WBC - lactic acid 1.4 - Follow blood cultures  GERD - Continue pantoprazole           Family Communication:  no Family at bedside  Consultants:  none  Code Status:  FULL    DVT Prophylaxis:  [place] Lovenox    Procedures: As Listed in Progress Note Above  Antibiotics: Ceftriaxone  1/20>> Doxy 1/20>>   Subjective: Patient complains of diarrhea.  He denies any medication melena.  He has right lower quadrant abdominal pain particular when he coughs.  He denies any hematochezia or melena.  He denies any dysuria.  He states that his shortness of breath is little better than yesterday.  He has an incessant cough.  Objective: Vitals:   02/20/24 0700 02/20/24 0753 02/20/24 1215 02/20/24 1249  BP: 134/72   119/65  Pulse:  80  (!) 104  Resp:  19  18  Temp: 98 F (36.7 C)   98 F (36.7 C)  TempSrc:  Oral  Oral  SpO2:  92% 90% 93%  Weight:      Height:        Intake/Output Summary (Last 24 hours) at 02/20/2024 1433 Last data filed at 02/20/2024 0900 Gross per 24 hour  Intake 240 ml  Output --  Net 240 ml   Weight change:  Exam:  General:  Pt is alert, follows commands appropriately, not in acute distress HEENT: No icterus, No thrush, No neck mass, [place]/AT Cardiovascular: RRR, S1/S2, no rubs, no gallops Respiratory: Bilateral rales but no wheezing.  Good air movement Abdomen: Soft/+BS, non tender, non distended, no guarding Extremities: No edema, No lymphangitis, No petechiae, No rashes, no synovitis   Data Reviewed: I have personally reviewed following labs and imaging studies Basic Metabolic Panel: Recent Labs  Lab 02/19/24 1101 02/20/24 0457  NA 136 137  K 3.6 3.5  CL 96* 103  CO2  27 21*  GLUCOSE 120* 100*  BUN 18 14  CREATININE 1.26* 0.94  CALCIUM  9.5 8.6*   Liver Function Tests: Recent Labs  Lab 02/19/24 1101  AST 42*  ALT 66*  ALKPHOS 163*  BILITOT 1.4*  PROT 7.7  ALBUMIN 3.9   No results for input(s): LIPASE, AMYLASE in the last 168 hours. No results for input(s): AMMONIA in the last 168 hours. Coagulation Profile: No results for input(s): INR, PROTIME in the last 168 hours. CBC: Recent Labs  Lab  02/19/24 1101 02/20/24 0457  WBC 19.6* 14.6*  NEUTROABS 16.7*  --   HGB 15.9 12.6*  HCT 46.1 36.9*  MCV 95.1 96.1  PLT 455* 325   Cardiac Enzymes: No results for input(s): CKTOTAL, CKMB, CKMBINDEX, TROPONINI in the last 168 hours. BNP: Invalid input(s): POCBNP CBG: No results for input(s): GLUCAP in the last 168 hours. HbA1C: No results for input(s): HGBA1C in the last 72 hours. Urine analysis:    Component Value Date/Time   COLORURINE YELLOW 02/19/2024 1205   APPEARANCEUR CLEAR 02/19/2024 1205   LABSPEC 1.020 02/19/2024 1205   PHURINE 5.0 02/19/2024 1205   GLUCOSEU NEGATIVE 02/19/2024 1205   HGBUR NEGATIVE 02/19/2024 1205   BILIRUBINUR NEGATIVE 02/19/2024 1205   KETONESUR NEGATIVE 02/19/2024 1205   PROTEINUR 30 (A) 02/19/2024 1205   NITRITE NEGATIVE 02/19/2024 1205   LEUKOCYTESUR NEGATIVE 02/19/2024 1205   Sepsis Labs: @LABRCNTIP (procalcitonin:4,lacticidven:4) ) Recent Results (from the past 240 hours)  Resp panel by RT-PCR (RSV, Flu A&B, Covid) Anterior Nasal Swab     Status: Abnormal   Collection Time: 02/11/24  1:50 PM   Specimen: Anterior Nasal Swab  Result Value Ref Range Status   SARS Coronavirus 2 by RT PCR NEGATIVE NEGATIVE Final    Comment: (NOTE) SARS-CoV-2 target nucleic acids are NOT DETECTED.  The SARS-CoV-2 RNA is generally detectable in upper respiratory specimens during the acute phase of infection. The lowest concentration of SARS-CoV-2 viral copies this assay can detect is 138 copies/mL. A negative result does not preclude SARS-Cov-2 infection and should not be used as the sole basis for treatment or other patient management decisions. A negative result may occur with  improper specimen collection/handling, submission of specimen other than nasopharyngeal swab, presence of viral mutation(s) within the areas targeted by this assay, and inadequate number of viral copies(<138 copies/mL). A negative result must be combined  with clinical observations, patient history, and epidemiological information. The expected result is Negative.  Fact Sheet for Patients:  bloggercourse.com  Fact Sheet for Healthcare Providers:  seriousbroker.it  This test is no t yet approved or cleared by the [place]  FDA and  has been authorized for detection and/or diagnosis of SARS-CoV-2 by FDA under an Emergency Use Authorization (EUA). This EUA will remain  in effect (meaning this test can be used) for the duration of the COVID-19 declaration under Section 564(b)(1) of the Act, 21 U.S.C.section 360bbb-3(b)(1), unless the authorization is terminated  or revoked sooner.       Influenza A by PCR POSITIVE (A) NEGATIVE Final   Influenza B by PCR NEGATIVE NEGATIVE Final    Comment: (NOTE) The Xpert Xpress SARS-CoV-2/FLU/RSV plus assay is intended as an aid in the diagnosis of influenza from Nasopharyngeal swab specimens and should not be used as a sole basis for treatment. Nasal washings and aspirates are unacceptable for Xpert Xpress SARS-CoV-2/FLU/RSV testing.  Fact Sheet for Patients: bloggercourse.com  Fact Sheet for Healthcare Providers: seriousbroker.it  This test is not yet approved or cleared  by the [place]  FDA and has been authorized for detection and/or diagnosis of SARS-CoV-2 by FDA under an Emergency Use Authorization (EUA). This EUA will remain in effect (meaning this test can be used) for the duration of the COVID-19 declaration under Section 564(b)(1) of the Act, 21 U.S.C. section 360bbb-3(b)(1), unless the authorization is terminated or revoked.     Resp Syncytial Virus by PCR NEGATIVE NEGATIVE Final    Comment: (NOTE) Fact Sheet for Patients: bloggercourse.com  Fact Sheet for Healthcare Providers: seriousbroker.it  This test is not yet  approved or cleared by the [place]  FDA and has been authorized for detection and/or diagnosis of SARS-CoV-2 by FDA under an Emergency Use Authorization (EUA). This EUA will remain in effect (meaning this test can be used) for the duration of the COVID-19 declaration under Section 564(b)(1) of the Act, 21 U.S.C. section 360bbb-3(b)(1), unless the authorization is terminated or revoked.  Performed at [facility], [address], [place], [place] [address]   Culture, blood (single)     Status: None (Preliminary result)   Collection Time: 02/19/24 11:01 AM   Specimen: BLOOD  Result Value Ref Range Status   Specimen Description BLOOD RIGHT ANTECUBITAL  Final   Special Requests   Final    BOTTLES DRAWN AEROBIC AND ANAEROBIC Blood Culture adequate volume   Culture   Final    NO GROWTH < 24 HOURS Performed at [facility], [address], [place], [place] [address]    Report Status PENDING  Incomplete  Culture, blood (routine x 2)     Status: None (Preliminary result)   Collection Time: 02/19/24 11:04 AM   Specimen: BLOOD  Result Value Ref Range Status   Specimen Description BLOOD RIGHT ARM  Final   Special Requests   Final    BOTTLES DRAWN AEROBIC AND ANAEROBIC Blood Culture results may not be optimal due to an inadequate volume of blood received in culture bottles   Culture   Final    NO GROWTH < 24 HOURS Performed at [facility], [address] [place], [place] [address]    Report Status PENDING  Incomplete  Resp panel by RT-PCR (RSV, Flu A&B, Covid) Anterior Nasal Swab     Status: None   Collection Time: 02/19/24  2:05 PM   Specimen: Anterior Nasal Swab  Result Value Ref Range Status   SARS Coronavirus 2 by RT PCR NEGATIVE NEGATIVE Final    Comment: (NOTE) SARS-CoV-2 target nucleic acids are NOT DETECTED.  The SARS-CoV-2 RNA is generally detectable in upper respiratory specimens during the acute phase of infection. The lowest concentration of SARS-CoV-2 viral copies this  assay can detect is 138 copies/mL. A negative result does not preclude SARS-Cov-2 infection and should not be used as the sole basis for treatment or other patient management decisions. A negative result may occur with  improper specimen collection/handling, submission of specimen other than nasopharyngeal swab, presence of viral mutation(s) within the areas targeted by this assay, and inadequate number of viral copies(<138 copies/mL). A negative result must be combined with clinical observations, patient history, and epidemiological information. The expected result is Negative.  Fact Sheet for Patients:  bloggercourse.com  Fact Sheet for Healthcare Providers:  seriousbroker.it  This test is no t yet approved or cleared by the [place]  FDA and  has been authorized for detection and/or diagnosis of SARS-CoV-2 by FDA under an Emergency Use Authorization (EUA). This EUA will remain  in effect (meaning this test can be used) for  the duration of the COVID-19 declaration under Section 564(b)(1) of the Act, 21 U.S.C.section 360bbb-3(b)(1), unless the authorization is terminated  or revoked sooner.       Influenza A by PCR NEGATIVE NEGATIVE Final   Influenza B by PCR NEGATIVE NEGATIVE Final    Comment: (NOTE) The Xpert Xpress SARS-CoV-2/FLU/RSV plus assay is intended as an aid in the diagnosis of influenza from Nasopharyngeal swab specimens and should not be used as a sole basis for treatment. Nasal washings and aspirates are unacceptable for Xpert Xpress SARS-CoV-2/FLU/RSV testing.  Fact Sheet for Patients: bloggercourse.com  Fact Sheet for Healthcare Providers: seriousbroker.it  This test is not yet approved or cleared by the [place]  FDA and has been authorized for detection and/or diagnosis of SARS-CoV-2 by FDA under an Emergency Use Authorization (EUA). This EUA will  remain in effect (meaning this test can be used) for the duration of the COVID-19 declaration under Section 564(b)(1) of the Act, 21 U.S.C. section 360bbb-3(b)(1), unless the authorization is terminated or revoked.     Resp Syncytial Virus by PCR NEGATIVE NEGATIVE Final    Comment: (NOTE) Fact Sheet for Patients: bloggercourse.com  Fact Sheet for Healthcare Providers: seriousbroker.it  This test is not yet approved or cleared by the [place]  FDA and has been authorized for detection and/or diagnosis of SARS-CoV-2 by FDA under an Emergency Use Authorization (EUA). This EUA will remain in effect (meaning this test can be used) for the duration of the COVID-19 declaration under Section 564(b)(1) of the Act, 21 U.S.C. section 360bbb-3(b)(1), unless the authorization is terminated or revoked.  Performed at [facility], [address], [place], [place] [address]      Scheduled Meds:  enoxaparin  (LOVENOX ) injection  40 mg Subcutaneous Q24H   pantoprazole   40 mg Oral QAC breakfast   Continuous Infusions:  cefTRIAXone  (ROCEPHIN )  IV 2 g (02/20/24 1208)   doxycycline  (VIBRAMYCIN ) IV 100 mg (02/20/24 0946)    Procedures/Studies: CT Angio Chest PE W and/or Wo Contrast Result Date: 02/19/2024 CLINICAL DATA:  Fever.  Concern for pulmonary embolism.  Flank pain. EXAM: CT ANGIOGRAPHY CHEST CT ABDOMEN AND PELVIS WITH CONTRAST TECHNIQUE: Multidetector CT imaging of the chest was performed using the standard protocol during bolus administration of intravenous contrast. Multiplanar CT image reconstructions and MIPs were obtained to evaluate the vascular anatomy. Multidetector CT imaging of the abdomen and pelvis was performed using the standard protocol during bolus administration of intravenous contrast. RADIATION DOSE REDUCTION: This exam was performed according to the departmental dose-optimization program which includes automated exposure  control, adjustment of the mA and/or kV according to patient size and/or use of iterative reconstruction technique. CONTRAST:  OMNIPAQUE  IOHEXOL  350 MG/ML SOLN COMPARISON:  Chest CT dated 02/11/2024 and CT abdomen pelvis dated 09/17/2019. FINDINGS: CTA CHEST FINDINGS Cardiovascular: There is no cardiomegaly or pericardial effusion. There is coronary vascular calcification of the LAD. Mild atherosclerotic calcification of the aortic arch. No aneurysmal dilatation or dissection. The origins of the great vessels of the aortic arch appear patent. Evaluation of the pulmonary arteries is limited due to respiratory motion and suboptimal visualization of the peripheral branches. No pulmonary artery embolus identified. Mediastinum/Nodes: Mildly enlarged left hilar and mediastinal lymph nodes measure up to 12 mm short axis in the subcarinal and paratracheal region. The esophagus is grossly normal. No mediastinal fluid collection. Lungs/Pleura: Diffuse clusters of nodular density throughout the lower lobes bilaterally consistent with atypical pneumonia or aspiration. Clinical correlation is recommended. Scattered bilateral subpleural reticular densities likely  sequela of an inflammatory/infectious etiology. No consolidative changes. There is no pleural effusion or pneumothorax. There is diffuse thickening of the lower lobe bronchi. The central airways are patent. Musculoskeletal: No acute osseous pathology. Review of the MIP images confirms the above findings. CT ABDOMEN and PELVIS FINDINGS No intra-abdominal free air or free fluid. Hepatobiliary: The liver is unremarkable. No biliary ductal dilatation. The gallbladder is unremarkable. Pancreas: Unremarkable. No pancreatic ductal dilatation or surrounding inflammatory changes. Spleen: Normal in size without focal abnormality. Adrenals/Urinary Tract: The adrenal glands unremarkable. Nonobstructing right renal calculi measure up to 5 mm. A tiny nonobstructing left renal  upper pole calculus is also suspected. There is no hydronephrosis on either side. There is symmetric enhancement and excretion of contrast by both kidneys. The visualized ureters appear unremarkable. The urinary bladder is poorly visualized due to streak artifact caused by bilateral hip arthroplasties. Stomach/Bowel: There is no bowel obstruction or active inflammation. The appendix is normal. Vascular/Lymphatic: Moderate aortoiliac atherosclerotic disease. The IVC is unremarkable. No portal venous gas. There is no adenopathy. Reproductive: The prostate gland is not visualized. Other: None Musculoskeletal: There is osteopenia with degenerative changes of the spine. Bilateral total hip arthroplasties. No acute osseous pathology. Review of the MIP images confirms the above findings. IMPRESSION: 1. No CT evidence of pulmonary artery embolus. 2. Diffuse clusters of nodular density throughout the lower lobes bilaterally consistent with atypical pneumonia or aspiration. 3. No acute intra-abdominal or pelvic pathology. 4. Nonobstructing bilateral renal calculi. No hydronephrosis. 5. No bowel obstruction. Normal appendix. 6.  Aortic Atherosclerosis (ICD10-I70.0). Electronically Signed   By: Vanetta Chou M.D.   On: 02/19/2024 15:23   CT ABDOMEN PELVIS W CONTRAST Result Date: 02/19/2024 CLINICAL DATA:  Fever.  Concern for pulmonary embolism.  Flank pain. EXAM: CT ANGIOGRAPHY CHEST CT ABDOMEN AND PELVIS WITH CONTRAST TECHNIQUE: Multidetector CT imaging of the chest was performed using the standard protocol during bolus administration of intravenous contrast. Multiplanar CT image reconstructions and MIPs were obtained to evaluate the vascular anatomy. Multidetector CT imaging of the abdomen and pelvis was performed using the standard protocol during bolus administration of intravenous contrast. RADIATION DOSE REDUCTION: This exam was performed according to the departmental dose-optimization program which includes  automated exposure control, adjustment of the mA and/or kV according to patient size and/or use of iterative reconstruction technique. CONTRAST:  OMNIPAQUE  IOHEXOL  350 MG/ML SOLN COMPARISON:  Chest CT dated 02/11/2024 and CT abdomen pelvis dated 09/17/2019. FINDINGS: CTA CHEST FINDINGS Cardiovascular: There is no cardiomegaly or pericardial effusion. There is coronary vascular calcification of the LAD. Mild atherosclerotic calcification of the aortic arch. No aneurysmal dilatation or dissection. The origins of the great vessels of the aortic arch appear patent. Evaluation of the pulmonary arteries is limited due to respiratory motion and suboptimal visualization of the peripheral branches. No pulmonary artery embolus identified. Mediastinum/Nodes: Mildly enlarged left hilar and mediastinal lymph nodes measure up to 12 mm short axis in the subcarinal and paratracheal region. The esophagus is grossly normal. No mediastinal fluid collection. Lungs/Pleura: Diffuse clusters of nodular density throughout the lower lobes bilaterally consistent with atypical pneumonia or aspiration. Clinical correlation is recommended. Scattered bilateral subpleural reticular densities likely sequela of an inflammatory/infectious etiology. No consolidative changes. There is no pleural effusion or pneumothorax. There is diffuse thickening of the lower lobe bronchi. The central airways are patent. Musculoskeletal: No acute osseous pathology. Review of the MIP images confirms the above findings. CT ABDOMEN and PELVIS FINDINGS No intra-abdominal free air or free fluid.  Hepatobiliary: The liver is unremarkable. No biliary ductal dilatation. The gallbladder is unremarkable. Pancreas: Unremarkable. No pancreatic ductal dilatation or surrounding inflammatory changes. Spleen: Normal in size without focal abnormality. Adrenals/Urinary Tract: The adrenal glands unremarkable. Nonobstructing right renal calculi measure up to 5 mm. A tiny  nonobstructing left renal upper pole calculus is also suspected. There is no hydronephrosis on either side. There is symmetric enhancement and excretion of contrast by both kidneys. The visualized ureters appear unremarkable. The urinary bladder is poorly visualized due to streak artifact caused by bilateral hip arthroplasties. Stomach/Bowel: There is no bowel obstruction or active inflammation. The appendix is normal. Vascular/Lymphatic: Moderate aortoiliac atherosclerotic disease. The IVC is unremarkable. No portal venous gas. There is no adenopathy. Reproductive: The prostate gland is not visualized. Other: None Musculoskeletal: There is osteopenia with degenerative changes of the spine. Bilateral total hip arthroplasties. No acute osseous pathology. Review of the MIP images confirms the above findings. IMPRESSION: 1. No CT evidence of pulmonary artery embolus. 2. Diffuse clusters of nodular density throughout the lower lobes bilaterally consistent with atypical pneumonia or aspiration. 3. No acute intra-abdominal or pelvic pathology. 4. Nonobstructing bilateral renal calculi. No hydronephrosis. 5. No bowel obstruction. Normal appendix. 6.  Aortic Atherosclerosis (ICD10-I70.0). Electronically Signed   By: Vanetta Chou M.D.   On: 02/19/2024 15:23   DG Chest Port 1 View Result Date: 02/19/2024 CLINICAL DATA:  Shortness of breath, productive cough EXAM: PORTABLE CHEST 1 VIEW COMPARISON:  February 11, 2024 FINDINGS: The heart size and mediastinal contours are within normal limits. No acute pulmonary abnormality is noted. The visualized skeletal structures are unremarkable. IMPRESSION: No active disease. Electronically Signed   By: Lynwood Landy Raddle M.D.   On: 02/19/2024 11:29   CT Angio Chest PE W and/or Wo Contrast Result Date: 02/11/2024 CLINICAL DATA:  Wheezing chest discomfort cough congestion EXAM: CT ANGIOGRAPHY CHEST WITH CONTRAST TECHNIQUE: Multidetector CT imaging of the chest was performed using the  standard protocol during bolus administration of intravenous contrast. Multiplanar CT image reconstructions and MIPs were obtained to evaluate the vascular anatomy. RADIATION DOSE REDUCTION: This exam was performed according to the departmental dose-optimization program which includes automated exposure control, adjustment of the mA and/or kV according to patient size and/or use of iterative reconstruction technique. CONTRAST:  75mL OMNIPAQUE  IOHEXOL  350 MG/ML SOLN COMPARISON:  Chest x-ray 02/11/2024, chest CT 05/10/2017 FINDINGS: Cardiovascular: Satisfactory opacification of the pulmonary arteries to the segmental level. No evidence of pulmonary embolism. Mild atherosclerosis. No aneurysm or dissection. Coronary vascular calcification. Upper normal cardiac size. No pericardial effusion Mediastinum/Nodes: Patent trachea. No suspicious thyroid  mass. Mildly prominent lymph nodes, for example left paratracheal node measuring 13 mm, previously 14 mm. Subcarinal lymph node measuring 11 mm, previously 12 mm. Small hilar nodes. Esophagus within normal limits aside from small hiatal hernia Lungs/Pleura: Mild emphysema. No consolidation, pleural effusion or pneumothorax. Bilateral bronchial wall thickening. Minimal mucous plugging in the lower lobes. Ground-glass nodule at the left apex measuring 6 mm, series 11, image 26. Mild subpleural reticulation. Upper Abdomen: No acute finding Musculoskeletal: No acute osseous abnormality Review of the MIP images confirms the above findings. IMPRESSION: 1. Negative for acute pulmonary embolus. 2. Emphysema with bilateral bronchial wall thickening and minimal mucous plugging in the lower lobes, suspect for airways inflammatory process. 3. 6 mm ground-glass nodule at the left apex. Initial follow-up with CT at 6 months is recommended to confirm persistence. If persistent, repeat CT is recommended every 2 years until 5 years of stability has  been established. This recommendation follows  the consensus statement: Guidelines for Management of Incidental Pulmonary Nodules Detected on CT Images: From the Fleischner Society 2017; Radiology 2017; 284:228-243. 4. Aortic atherosclerosis. Aortic Atherosclerosis (ICD10-I70.0) and Emphysema (ICD10-J43.9). Electronically Signed   By: Luke Bun M.D.   On: 02/11/2024 16:44   DG Chest 2 View Result Date: 02/11/2024 EXAM: 2 VIEW(S) XRAY OF THE CHEST 02/11/2024 02:44:59 PM COMPARISON: None available. CLINICAL HISTORY: cough FINDINGS: LUNGS AND PLEURA: Chronic peripheral interstitial thickening. No focal pulmonary opacity. No pleural effusion. No pneumothorax. HEART AND MEDIASTINUM: No acute abnormality of the cardiac and mediastinal silhouettes. BONES AND SOFT TISSUES: No acute fracture or destructive lesion. Multilevel thoracic osteophytosis. IMPRESSION: 1. No acute findings. 2. Chronic peripheral interstitial thickening. Electronically signed by: Norleen Boxer MD MD 02/11/2024 03:46 PM EST RP Workstation: [id number]    Alm Schneider, DO  [facility]  If 7PM-7AM, please contact night-coverage [email or web address] Password [facility] 02/20/2024, 2:33 PM   LOS: 1 day   "

## 2024-02-20 NOTE — Progress Notes (Signed)
 RT contacted for PRN breathing treatment do to SOB and Wheezing

## 2024-02-20 NOTE — Hospital Course (Signed)
 64 year old male with a history of CVA, GERD, migraine, PUD, Raynaud's phenomenon presenting with shortness of breath, coughing, chest congestion, and brown sputum for the past 4 days. In the ED, the patient had temperature of 100.9 F with tachycardia up to 110.  He was hemodynamically stable.  Oxygen saturation was 92-94% on room air.. WBC 19.6, hemoglobin 15.9, platelet 455.  Sodium 137, potassium 3.5, bicarbonate 21, serum creatinine 0.94. CTA chest was negative for PE but showed diffuse clustered nodular density in the bilateral lower lobes.  There is nonobstructive bilateral renal calculi.  CT abdomen and pelvis was negative for any acute findings.  Notably, the patient had a recent hospitalization from 02/11/2024 to 02/12/2024 when he was treated for acute respiratory failure secondary to influenza.  He was weaned off of supplemental oxygen and discharged on room air.  He was instructed to follow-up with pulmonologist regarding emphysema noted on his CT chest.  He was discharged to finish a course of steroids, oseltamavir, and antitussive  medications.

## 2024-02-20 NOTE — Plan of Care (Signed)

## 2024-02-20 NOTE — Discharge Instructions (Signed)

## 2024-02-20 NOTE — Progress Notes (Signed)
 Nurse at bedside. Discussed with pt that we are needing a stool sample. Hat has been placed in BR and instructed pt to call if he has a bm. Pt complaint of cough as well as sputum. Informed pt when next breathing treatment is due I will call RT to administer it.

## 2024-02-21 DIAGNOSIS — J181 Lobar pneumonia, unspecified organism: Secondary | ICD-10-CM | POA: Diagnosis not present

## 2024-02-21 DIAGNOSIS — A419 Sepsis, unspecified organism: Secondary | ICD-10-CM | POA: Diagnosis not present

## 2024-02-21 LAB — C DIFFICILE QUICK SCREEN W PCR REFLEX
C Diff antigen: NEGATIVE
C Diff interpretation: NOT DETECTED
C Diff toxin: NEGATIVE

## 2024-02-21 LAB — BASIC METABOLIC PANEL WITH GFR
Anion gap: 10 (ref 5–15)
BUN: 11 mg/dL (ref 8–23)
CO2: 26 mmol/L (ref 22–32)
Calcium: 8.5 mg/dL — ABNORMAL LOW (ref 8.9–10.3)
Chloride: 100 mmol/L (ref 98–111)
Creatinine, Ser: 1.06 mg/dL (ref 0.61–1.24)
GFR, Estimated: >60 mL/min
Glucose, Bld: 114 mg/dL — ABNORMAL HIGH (ref 70–99)
Potassium: 3 mmol/L — ABNORMAL LOW (ref 3.5–5.1)
Sodium: 136 mmol/L (ref 135–145)

## 2024-02-21 LAB — CBC
HCT: 35.3 % — ABNORMAL LOW (ref 39.0–52.0)
Hemoglobin: 11.9 g/dL — ABNORMAL LOW (ref 13.0–17.0)
MCH: 32.7 pg (ref 26.0–34.0)
MCHC: 33.7 g/dL (ref 30.0–36.0)
MCV: 97 fL (ref 80.0–100.0)
Platelets: 334 K/uL (ref 150–400)
RBC: 3.64 MIL/uL — ABNORMAL LOW (ref 4.22–5.81)
RDW: 12.7 % (ref 11.5–15.5)
WBC: 13.1 K/uL — ABNORMAL HIGH (ref 4.0–10.5)
nRBC: 0 % (ref 0.0–0.2)

## 2024-02-21 LAB — MAGNESIUM: Magnesium: 2.2 mg/dL (ref 1.7–2.4)

## 2024-02-21 LAB — PROCALCITONIN: Procalcitonin: 0.23 ng/mL

## 2024-02-21 MED ORDER — POTASSIUM CHLORIDE CRYS ER 20 MEQ PO TBCR
40.0000 meq | EXTENDED_RELEASE_TABLET | Freq: Once | ORAL | Status: AC
  Administered 2024-02-21: 40 meq via ORAL
  Filled 2024-02-21: qty 2

## 2024-02-21 MED ORDER — DOXYCYCLINE HYCLATE 100 MG PO TABS
100.0000 mg | ORAL_TABLET | Freq: Two times a day (BID) | ORAL | Status: DC
  Administered 2024-02-21 – 2024-02-22 (×2): 100 mg via ORAL
  Filled 2024-02-21 (×3): qty 1

## 2024-02-21 NOTE — Progress Notes (Signed)
 PHARMACIST - PHYSICIAN COMMUNICATION DR:   TRH CONCERNING: Antibiotic IV to Oral Route Change Policy  RECOMMENDATION: This patient is receiving Doxycycline  by the intravenous route.  Based on criteria approved by the Pharmacy and Therapeutics Committee, the antibiotic(s) is/are being converted to the equivalent oral dose form(s).   DESCRIPTION: These criteria include: Patient being treated for a respiratory tract infection, urinary tract infection, cellulitis or clostridium difficile associated diarrhea if on metronidazole The patient is not neutropenic and does not exhibit a GI malabsorption state The patient is eating (either orally or via tube) and/or has been taking other orally administered medications for a least 24 hours The patient is improving clinically and has a Tmax < 100.5  If you have questions about this conversion, please contact the Pharmacy Department  [x]   [phone] )  [facility] []   [phone] )  [facility] []   [phone] )  [facility] []   [phone] )  [facility] []   [phone] )  [facility]   Celestine Slovak, PharmD, [place], [place] Work Cell: [phone] 02/21/2024 11:25 AM

## 2024-02-21 NOTE — Progress Notes (Signed)
 "          PROGRESS NOTE  Cory Roberts FMW:[id number] DOB: Jul 13, 1960 DOA: 02/19/2024 PCP: Patient, No Pcp Per  Brief History:  64 year old male with a history of CVA, GERD, migraine, PUD, Raynaud's phenomenon presenting with shortness of breath, coughing, chest congestion, and brown sputum for the past 4 days. In the ED, the patient had temperature of 100.9 F with tachycardia up to 110.  He was hemodynamically stable.  Oxygen saturation was 92-94% on room air.. WBC 19.6, hemoglobin 15.9, platelet 455.  Sodium 137, potassium 3.5, bicarbonate 21, serum creatinine 0.94. CTA chest was negative for PE but showed diffuse clustered nodular density in the bilateral lower lobes.  There is nonobstructive bilateral renal calculi.  CT abdomen and pelvis was negative for any acute findings.  Notably, the patient had a recent hospitalization from 02/11/2024 to 02/12/2024 when he was treated for acute respiratory failure secondary to influenza.  He was weaned off of supplemental oxygen and discharged on room air.  He was instructed to follow-up with pulmonologist regarding emphysema noted on his CT chest.  He was discharged to finish a course of steroids, oseltamavir, and antitussive  medications.   Assessment/Plan: Lobar pneumonia -02/19/2024 CTA chest--negative PE, diffuse cluster of nodular densities bilateral lower lobe - Continue ceftriaxone  and doxycycline  - COVID/RSV/influenza neg - Start Hycodan -There was a consideration for possible undiagnosed COPD during recent hospitalization and was to follow-up with outpatient pulmonology for PFTs. He quit smoking 20 years ago after 35 pack years   Sepsis - Present on admission - Presented with fever, tachycardia, tachypnea, leukocytosis - Secondary to pneumonia - UA 0-5 WBC - lactic acid 1.4 - Follow blood cultures--neg to date   GERD - Continue pantoprazole    Hypokalemia -replete -check mag 2.2               Family Communication:  no Family  at bedside   Consultants:  none   Code Status:  FULL    DVT Prophylaxis:  [place] Lovenox      Procedures: As Listed in Progress Note Above   Antibiotics: Ceftriaxone  1/20>> Doxy 1/20>>        Subjective: Pt states diarrhea is better.  Sob is improving.  Denies cp, sob, n/v/d, abd pain  Objective: Vitals:   02/21/24 0800 02/21/24 0855 02/21/24 1357 02/21/24 1442  BP:   120/67   Pulse:   84   Resp: 18  18   Temp:   97.9 F (36.6 C)   TempSrc:   Oral   SpO2:  94% 96% 95%  Weight:      Height:        Intake/Output Summary (Last 24 hours) at 02/21/2024 1816 Last data filed at 02/21/2024 1726 Gross per 24 hour  Intake 1160 ml  Output --  Net 1160 ml   Weight change:  Exam:  General:  Pt is alert, follows commands appropriately, not in acute distress HEENT: No icterus, No thrush, No neck mass, [place]/AT Cardiovascular: RRR, S1/S2, no rubs, no gallops Respiratory:bibasilar rales. No wheeze Abdomen: Soft/+BS, non tender, non distended, no guarding Extremities: No edema, No lymphangitis, No petechiae, No rashes, no synovitis   Data Reviewed: I have personally reviewed following labs and imaging studies Basic Metabolic Panel: Recent Labs  Lab 02/19/24 1101 02/20/24 0457 02/21/24 0425  NA 136 137 136  K 3.6 3.5 3.0*  CL 96* 103 100  CO2 27 21* 26  GLUCOSE 120* 100* 114*  BUN 18 14 11   CREATININE  1.26* 0.94 1.06  CALCIUM  9.5 8.6* 8.5*  MG  --   --  2.2   Liver Function Tests: Recent Labs  Lab 02/19/24 1101  AST 42*  ALT 66*  ALKPHOS 163*  BILITOT 1.4*  PROT 7.7  ALBUMIN 3.9   No results for input(s): LIPASE, AMYLASE in the last 168 hours. No results for input(s): AMMONIA in the last 168 hours. Coagulation Profile: No results for input(s): INR, PROTIME in the last 168 hours. CBC: Recent Labs  Lab 02/19/24 1101 02/20/24 0457 02/21/24 0425  WBC 19.6* 14.6* 13.1*  NEUTROABS 16.7*  --   --   HGB 15.9 12.6* 11.9*  HCT 46.1 36.9* 35.3*   MCV 95.1 96.1 97.0  PLT 455* 325 334   Cardiac Enzymes: No results for input(s): CKTOTAL, CKMB, CKMBINDEX, TROPONINI in the last 168 hours. BNP: Invalid input(s): POCBNP CBG: No results for input(s): GLUCAP in the last 168 hours. HbA1C: No results for input(s): HGBA1C in the last 72 hours. Urine analysis:    Component Value Date/Time   COLORURINE YELLOW 02/19/2024 1205   APPEARANCEUR CLEAR 02/19/2024 1205   LABSPEC 1.020 02/19/2024 1205   PHURINE 5.0 02/19/2024 1205   GLUCOSEU NEGATIVE 02/19/2024 1205   HGBUR NEGATIVE 02/19/2024 1205   BILIRUBINUR NEGATIVE 02/19/2024 1205   KETONESUR NEGATIVE 02/19/2024 1205   PROTEINUR 30 (A) 02/19/2024 1205   NITRITE NEGATIVE 02/19/2024 1205   LEUKOCYTESUR NEGATIVE 02/19/2024 1205   Sepsis Labs: @LABRCNTIP (procalcitonin:4,lacticidven:4) ) Recent Results (from the past 240 hours)  Culture, blood (single)     Status: None (Preliminary result)   Collection Time: 02/19/24 11:01 AM   Specimen: BLOOD  Result Value Ref Range Status   Specimen Description BLOOD RIGHT ANTECUBITAL  Final   Special Requests   Final    BOTTLES DRAWN AEROBIC AND ANAEROBIC Blood Culture adequate volume   Culture   Final    NO GROWTH 2 DAYS Performed at [facility], [address] [place], [place] [address]    Report Status PENDING  Incomplete  Culture, blood (routine x 2)     Status: None (Preliminary result)   Collection Time: 02/19/24 11:04 AM   Specimen: BLOOD  Result Value Ref Range Status   Specimen Description BLOOD RIGHT ARM  Final   Special Requests   Final    BOTTLES DRAWN AEROBIC AND ANAEROBIC Blood Culture results may not be optimal due to an inadequate volume of blood received in culture bottles   Culture   Final    NO GROWTH 2 DAYS Performed at [facility], [address], [place], [place] [address]    Report Status PENDING  Incomplete  Resp panel by RT-PCR (RSV, Flu A&B, Covid) Anterior Nasal Swab     Status: None   Collection  Time: 02/19/24  2:05 PM   Specimen: Anterior Nasal Swab  Result Value Ref Range Status   SARS Coronavirus 2 by RT PCR NEGATIVE NEGATIVE Final    Comment: (NOTE) SARS-CoV-2 target nucleic acids are NOT DETECTED.  The SARS-CoV-2 RNA is generally detectable in upper respiratory specimens during the acute phase of infection. The lowest concentration of SARS-CoV-2 viral copies this assay can detect is 138 copies/mL. A negative result does not preclude SARS-Cov-2 infection and should not be used as the sole basis for treatment or other patient management decisions. A negative result may occur with  improper specimen collection/handling, submission of specimen other than nasopharyngeal swab, presence of viral mutation(s) within the areas targeted by this assay, and inadequate  number of viral copies(<138 copies/mL). A negative result must be combined with clinical observations, patient history, and epidemiological information. The expected result is Negative.  Fact Sheet for Patients:  bloggercourse.com  Fact Sheet for Healthcare Providers:  seriousbroker.it  This test is no t yet approved or cleared by the [place]  FDA and  has been authorized for detection and/or diagnosis of SARS-CoV-2 by FDA under an Emergency Use Authorization (EUA). This EUA will remain  in effect (meaning this test can be used) for the duration of the COVID-19 declaration under Section 564(b)(1) of the Act, 21 U.S.C.section 360bbb-3(b)(1), unless the authorization is terminated  or revoked sooner.       Influenza A by PCR NEGATIVE NEGATIVE Final   Influenza B by PCR NEGATIVE NEGATIVE Final    Comment: (NOTE) The Xpert Xpress SARS-CoV-2/FLU/RSV plus assay is intended as an aid in the diagnosis of influenza from Nasopharyngeal swab specimens and should not be used as a sole basis for treatment. Nasal washings and aspirates are unacceptable for Xpert Xpress  SARS-CoV-2/FLU/RSV testing.  Fact Sheet for Patients: bloggercourse.com  Fact Sheet for Healthcare Providers: seriousbroker.it  This test is not yet approved or cleared by the [place]  FDA and has been authorized for detection and/or diagnosis of SARS-CoV-2 by FDA under an Emergency Use Authorization (EUA). This EUA will remain in effect (meaning this test can be used) for the duration of the COVID-19 declaration under Section 564(b)(1) of the Act, 21 U.S.C. section 360bbb-3(b)(1), unless the authorization is terminated or revoked.     Resp Syncytial Virus by PCR NEGATIVE NEGATIVE Final    Comment: (NOTE) Fact Sheet for Patients: bloggercourse.com  Fact Sheet for Healthcare Providers: seriousbroker.it  This test is not yet approved or cleared by the [place]  FDA and has been authorized for detection and/or diagnosis of SARS-CoV-2 by FDA under an Emergency Use Authorization (EUA). This EUA will remain in effect (meaning this test can be used) for the duration of the COVID-19 declaration under Section 564(b)(1) of the Act, 21 U.S.C. section 360bbb-3(b)(1), unless the authorization is terminated or revoked.  Performed at [facility], [address] [place], [place] [address]   C Difficile Quick Screen w PCR reflex     Status: None   Collection Time: 02/21/24  3:40 AM   Specimen: STOOL  Result Value Ref Range Status   C Diff antigen NEGATIVE NEGATIVE Final   C Diff toxin NEGATIVE NEGATIVE Final   C Diff interpretation No C. difficile detected.  Final    Comment: Performed at [facility], [address] [place], [place] [address]     Scheduled Meds:  doxycycline   100 mg Oral Q12H   enoxaparin  (LOVENOX ) injection  40 mg Subcutaneous Q24H   ipratropium-albuterol   3 mL Nebulization TID   pantoprazole   40 mg Oral QAC breakfast   Continuous Infusions:  cefTRIAXone   (ROCEPHIN )  IV 2 g (02/21/24 1410)    Procedures/Studies: CT Angio Chest PE W and/or Wo Contrast Result Date: 02/19/2024 CLINICAL DATA:  Fever.  Concern for pulmonary embolism.  Flank pain. EXAM: CT ANGIOGRAPHY CHEST CT ABDOMEN AND PELVIS WITH CONTRAST TECHNIQUE: Multidetector CT imaging of the chest was performed using the standard protocol during bolus administration of intravenous contrast. Multiplanar CT image reconstructions and MIPs were obtained to evaluate the vascular anatomy. Multidetector CT imaging of the abdomen and pelvis was performed using the standard protocol during bolus administration of intravenous contrast. RADIATION DOSE REDUCTION: This exam was performed according to the departmental  dose-optimization program which includes automated exposure control, adjustment of the mA and/or kV according to patient size and/or use of iterative reconstruction technique. CONTRAST:  OMNIPAQUE  IOHEXOL  350 MG/ML SOLN COMPARISON:  Chest CT dated 02/11/2024 and CT abdomen pelvis dated 09/17/2019. FINDINGS: CTA CHEST FINDINGS Cardiovascular: There is no cardiomegaly or pericardial effusion. There is coronary vascular calcification of the LAD. Mild atherosclerotic calcification of the aortic arch. No aneurysmal dilatation or dissection. The origins of the great vessels of the aortic arch appear patent. Evaluation of the pulmonary arteries is limited due to respiratory motion and suboptimal visualization of the peripheral branches. No pulmonary artery embolus identified. Mediastinum/Nodes: Mildly enlarged left hilar and mediastinal lymph nodes measure up to 12 mm short axis in the subcarinal and paratracheal region. The esophagus is grossly normal. No mediastinal fluid collection. Lungs/Pleura: Diffuse clusters of nodular density throughout the lower lobes bilaterally consistent with atypical pneumonia or aspiration. Clinical correlation is recommended. Scattered bilateral subpleural reticular densities  likely sequela of an inflammatory/infectious etiology. No consolidative changes. There is no pleural effusion or pneumothorax. There is diffuse thickening of the lower lobe bronchi. The central airways are patent. Musculoskeletal: No acute osseous pathology. Review of the MIP images confirms the above findings. CT ABDOMEN and PELVIS FINDINGS No intra-abdominal free air or free fluid. Hepatobiliary: The liver is unremarkable. No biliary ductal dilatation. The gallbladder is unremarkable. Pancreas: Unremarkable. No pancreatic ductal dilatation or surrounding inflammatory changes. Spleen: Normal in size without focal abnormality. Adrenals/Urinary Tract: The adrenal glands unremarkable. Nonobstructing right renal calculi measure up to 5 mm. A tiny nonobstructing left renal upper pole calculus is also suspected. There is no hydronephrosis on either side. There is symmetric enhancement and excretion of contrast by both kidneys. The visualized ureters appear unremarkable. The urinary bladder is poorly visualized due to streak artifact caused by bilateral hip arthroplasties. Stomach/Bowel: There is no bowel obstruction or active inflammation. The appendix is normal. Vascular/Lymphatic: Moderate aortoiliac atherosclerotic disease. The IVC is unremarkable. No portal venous gas. There is no adenopathy. Reproductive: The prostate gland is not visualized. Other: None Musculoskeletal: There is osteopenia with degenerative changes of the spine. Bilateral total hip arthroplasties. No acute osseous pathology. Review of the MIP images confirms the above findings. IMPRESSION: 1. No CT evidence of pulmonary artery embolus. 2. Diffuse clusters of nodular density throughout the lower lobes bilaterally consistent with atypical pneumonia or aspiration. 3. No acute intra-abdominal or pelvic pathology. 4. Nonobstructing bilateral renal calculi. No hydronephrosis. 5. No bowel obstruction. Normal appendix. 6.  Aortic Atherosclerosis  (ICD10-I70.0). Electronically Signed   By: Vanetta Chou M.D.   On: 02/19/2024 15:23   CT ABDOMEN PELVIS W CONTRAST Result Date: 02/19/2024 CLINICAL DATA:  Fever.  Concern for pulmonary embolism.  Flank pain. EXAM: CT ANGIOGRAPHY CHEST CT ABDOMEN AND PELVIS WITH CONTRAST TECHNIQUE: Multidetector CT imaging of the chest was performed using the standard protocol during bolus administration of intravenous contrast. Multiplanar CT image reconstructions and MIPs were obtained to evaluate the vascular anatomy. Multidetector CT imaging of the abdomen and pelvis was performed using the standard protocol during bolus administration of intravenous contrast. RADIATION DOSE REDUCTION: This exam was performed according to the departmental dose-optimization program which includes automated exposure control, adjustment of the mA and/or kV according to patient size and/or use of iterative reconstruction technique. CONTRAST:  OMNIPAQUE  IOHEXOL  350 MG/ML SOLN COMPARISON:  Chest CT dated 02/11/2024 and CT abdomen pelvis dated 09/17/2019. FINDINGS: CTA CHEST FINDINGS Cardiovascular: There is no cardiomegaly or pericardial effusion.  There is coronary vascular calcification of the LAD. Mild atherosclerotic calcification of the aortic arch. No aneurysmal dilatation or dissection. The origins of the great vessels of the aortic arch appear patent. Evaluation of the pulmonary arteries is limited due to respiratory motion and suboptimal visualization of the peripheral branches. No pulmonary artery embolus identified. Mediastinum/Nodes: Mildly enlarged left hilar and mediastinal lymph nodes measure up to 12 mm short axis in the subcarinal and paratracheal region. The esophagus is grossly normal. No mediastinal fluid collection. Lungs/Pleura: Diffuse clusters of nodular density throughout the lower lobes bilaterally consistent with atypical pneumonia or aspiration. Clinical correlation is recommended. Scattered bilateral subpleural  reticular densities likely sequela of an inflammatory/infectious etiology. No consolidative changes. There is no pleural effusion or pneumothorax. There is diffuse thickening of the lower lobe bronchi. The central airways are patent. Musculoskeletal: No acute osseous pathology. Review of the MIP images confirms the above findings. CT ABDOMEN and PELVIS FINDINGS No intra-abdominal free air or free fluid. Hepatobiliary: The liver is unremarkable. No biliary ductal dilatation. The gallbladder is unremarkable. Pancreas: Unremarkable. No pancreatic ductal dilatation or surrounding inflammatory changes. Spleen: Normal in size without focal abnormality. Adrenals/Urinary Tract: The adrenal glands unremarkable. Nonobstructing right renal calculi measure up to 5 mm. A tiny nonobstructing left renal upper pole calculus is also suspected. There is no hydronephrosis on either side. There is symmetric enhancement and excretion of contrast by both kidneys. The visualized ureters appear unremarkable. The urinary bladder is poorly visualized due to streak artifact caused by bilateral hip arthroplasties. Stomach/Bowel: There is no bowel obstruction or active inflammation. The appendix is normal. Vascular/Lymphatic: Moderate aortoiliac atherosclerotic disease. The IVC is unremarkable. No portal venous gas. There is no adenopathy. Reproductive: The prostate gland is not visualized. Other: None Musculoskeletal: There is osteopenia with degenerative changes of the spine. Bilateral total hip arthroplasties. No acute osseous pathology. Review of the MIP images confirms the above findings. IMPRESSION: 1. No CT evidence of pulmonary artery embolus. 2. Diffuse clusters of nodular density throughout the lower lobes bilaterally consistent with atypical pneumonia or aspiration. 3. No acute intra-abdominal or pelvic pathology. 4. Nonobstructing bilateral renal calculi. No hydronephrosis. 5. No bowel obstruction. Normal appendix. 6.  Aortic  Atherosclerosis (ICD10-I70.0). Electronically Signed   By: Vanetta Chou M.D.   On: 02/19/2024 15:23   DG Chest Port 1 View Result Date: 02/19/2024 CLINICAL DATA:  Shortness of breath, productive cough EXAM: PORTABLE CHEST 1 VIEW COMPARISON:  February 11, 2024 FINDINGS: The heart size and mediastinal contours are within normal limits. No acute pulmonary abnormality is noted. The visualized skeletal structures are unremarkable. IMPRESSION: No active disease. Electronically Signed   By: Lynwood Landy Raddle M.D.   On: 02/19/2024 11:29   CT Angio Chest PE W and/or Wo Contrast Result Date: 02/11/2024 CLINICAL DATA:  Wheezing chest discomfort cough congestion EXAM: CT ANGIOGRAPHY CHEST WITH CONTRAST TECHNIQUE: Multidetector CT imaging of the chest was performed using the standard protocol during bolus administration of intravenous contrast. Multiplanar CT image reconstructions and MIPs were obtained to evaluate the vascular anatomy. RADIATION DOSE REDUCTION: This exam was performed according to the departmental dose-optimization program which includes automated exposure control, adjustment of the mA and/or kV according to patient size and/or use of iterative reconstruction technique. CONTRAST:  75mL OMNIPAQUE  IOHEXOL  350 MG/ML SOLN COMPARISON:  Chest x-ray 02/11/2024, chest CT 05/10/2017 FINDINGS: Cardiovascular: Satisfactory opacification of the pulmonary arteries to the segmental level. No evidence of pulmonary embolism. Mild atherosclerosis. No aneurysm or dissection. Coronary vascular calcification.  Upper normal cardiac size. No pericardial effusion Mediastinum/Nodes: Patent trachea. No suspicious thyroid  mass. Mildly prominent lymph nodes, for example left paratracheal node measuring 13 mm, previously 14 mm. Subcarinal lymph node measuring 11 mm, previously 12 mm. Small hilar nodes. Esophagus within normal limits aside from small hiatal hernia Lungs/Pleura: Mild emphysema. No consolidation, pleural effusion or  pneumothorax. Bilateral bronchial wall thickening. Minimal mucous plugging in the lower lobes. Ground-glass nodule at the left apex measuring 6 mm, series 11, image 26. Mild subpleural reticulation. Upper Abdomen: No acute finding Musculoskeletal: No acute osseous abnormality Review of the MIP images confirms the above findings. IMPRESSION: 1. Negative for acute pulmonary embolus. 2. Emphysema with bilateral bronchial wall thickening and minimal mucous plugging in the lower lobes, suspect for airways inflammatory process. 3. 6 mm ground-glass nodule at the left apex. Initial follow-up with CT at 6 months is recommended to confirm persistence. If persistent, repeat CT is recommended every 2 years until 5 years of stability has been established. This recommendation follows the consensus statement: Guidelines for Management of Incidental Pulmonary Nodules Detected on CT Images: From the Fleischner Society 2017; Radiology 2017; 284:228-243. 4. Aortic atherosclerosis. Aortic Atherosclerosis (ICD10-I70.0) and Emphysema (ICD10-J43.9). Electronically Signed   By: Luke Bun M.D.   On: 02/11/2024 16:44   DG Chest 2 View Result Date: 02/11/2024 EXAM: 2 VIEW(S) XRAY OF THE CHEST 02/11/2024 02:44:59 PM COMPARISON: None available. CLINICAL HISTORY: cough FINDINGS: LUNGS AND PLEURA: Chronic peripheral interstitial thickening. No focal pulmonary opacity. No pleural effusion. No pneumothorax. HEART AND MEDIASTINUM: No acute abnormality of the cardiac and mediastinal silhouettes. BONES AND SOFT TISSUES: No acute fracture or destructive lesion. Multilevel thoracic osteophytosis. IMPRESSION: 1. No acute findings. 2. Chronic peripheral interstitial thickening. Electronically signed by: Norleen Boxer MD MD 02/11/2024 03:46 PM EST RP Workstation: [id number]    Alm Schneider, DO  [facility]  If 7PM-7AM, please contact night-coverage [email or web address] Password [facility] 02/21/2024, 6:16 PM   LOS: 2 days   "

## 2024-02-21 NOTE — Plan of Care (Signed)
" °  Problem: Education: Goal: Knowledge of General Education information will improve Description: Including pain rating scale, medication(s)/side effects and non-pharmacologic comfort measures Outcome: Progressing   Problem: Clinical Measurements: Goal: Ability to maintain clinical measurements within normal limits will improve Outcome: Progressing Goal: Respiratory complications will improve Outcome: Progressing   Problem: Activity: Goal: Risk for activity intolerance will decrease Outcome: Progressing   Problem: Pain Managment: Goal: General experience of comfort will improve and/or be controlled Outcome: Progressing   Problem: Safety: Goal: Ability to remain free from injury will improve Outcome: Progressing   Problem: Activity: Goal: Ability to tolerate increased activity will improve Outcome: Progressing   Problem: Respiratory: Goal: Ability to maintain adequate ventilation will improve Outcome: Progressing Goal: Ability to maintain a clear airway will improve Outcome: Progressing   "

## 2024-02-21 NOTE — Plan of Care (Signed)

## 2024-02-21 NOTE — Progress Notes (Signed)
 Mobility Specialist Progress Note:    02/21/24 1410  Mobility  Activity Ambulated with assistance  Level of Assistance Independent  Assistive Device None  Distance Ambulated (ft) 480 ft  Range of Motion/Exercises Active;All extremities  Activity Response Tolerated well  Mobility Referral Yes  Mobility visit 1 Mobility  Mobility Specialist Start Time (ACUTE ONLY) 1410  Mobility Specialist Stop Time (ACUTE ONLY) 1430  Mobility Specialist Time Calculation (min) (ACUTE ONLY) 20 min   Pt received in bed, agreeable to mobility. Independently able to stand and ambulate with no AD. Tolerated well, audible SOB and wheezing. SpO2 95% on RA EOS, returned supine. All needs met.  Atia Haupt Mobility Specialist Please contact via Special Educational Needs Teacher or  Rehab office at [phone]

## 2024-02-22 DIAGNOSIS — J181 Lobar pneumonia, unspecified organism: Secondary | ICD-10-CM | POA: Diagnosis not present

## 2024-02-22 DIAGNOSIS — A419 Sepsis, unspecified organism: Secondary | ICD-10-CM | POA: Diagnosis not present

## 2024-02-22 LAB — MAGNESIUM: Magnesium: 2.1 mg/dL (ref 1.7–2.4)

## 2024-02-22 LAB — BASIC METABOLIC PANEL WITH GFR
Anion gap: 9 (ref 5–15)
BUN: 10 mg/dL (ref 8–23)
CO2: 26 mmol/L (ref 22–32)
Calcium: 8.5 mg/dL — ABNORMAL LOW (ref 8.9–10.3)
Chloride: 101 mmol/L (ref 98–111)
Creatinine, Ser: 0.84 mg/dL (ref 0.61–1.24)
GFR, Estimated: >60 mL/min
Glucose, Bld: 119 mg/dL — ABNORMAL HIGH (ref 70–99)
Potassium: 3.3 mmol/L — ABNORMAL LOW (ref 3.5–5.1)
Sodium: 136 mmol/L (ref 135–145)

## 2024-02-22 LAB — CBC
HCT: 35.7 % — ABNORMAL LOW (ref 39.0–52.0)
Hemoglobin: 12 g/dL — ABNORMAL LOW (ref 13.0–17.0)
MCH: 32.4 pg (ref 26.0–34.0)
MCHC: 33.6 g/dL (ref 30.0–36.0)
MCV: 96.5 fL (ref 80.0–100.0)
Platelets: 336 K/uL (ref 150–400)
RBC: 3.7 MIL/uL — ABNORMAL LOW (ref 4.22–5.81)
RDW: 12.8 % (ref 11.5–15.5)
WBC: 11.9 K/uL — ABNORMAL HIGH (ref 4.0–10.5)
nRBC: 0 % (ref 0.0–0.2)

## 2024-02-22 MED ORDER — CEFDINIR 300 MG PO CAPS: 300.0000 mg | ORAL_CAPSULE | Freq: Two times a day (BID) | ORAL | 0 refills | Status: AC

## 2024-02-22 MED ORDER — DOXYCYCLINE HYCLATE 100 MG PO TABS: 100.0000 mg | ORAL_TABLET | Freq: Two times a day (BID) | ORAL | 0 refills | Status: AC

## 2024-02-22 MED ORDER — POTASSIUM CHLORIDE CRYS ER 20 MEQ PO TBCR
40.0000 meq | EXTENDED_RELEASE_TABLET | Freq: Once | ORAL | Status: AC
  Administered 2024-02-22: 40 meq via ORAL
  Filled 2024-02-22: qty 2

## 2024-02-22 NOTE — Discharge Summary (Signed)
 " Physician Discharge Summary   Patient: Cory Roberts MRN: [id number] DOB: [birth year]  Admit date:     02/19/2024  Discharge date: 02/22/24  Discharge Physician: Alm Zamzam Whinery   PCP: Patient, No Pcp Per   Recommendations at discharge:   Please follow up with primary care provider within 1-2 weeks  Please repeat BMP and CBC in one week   Hospital Course: 63 year old male with a history of CVA, GERD, migraine, PUD, Raynaud's phenomenon presenting with shortness of breath, coughing, chest congestion, and brown sputum for the past 4 days. In the ED, the patient had temperature of 100.9 F with tachycardia up to 110.  He was hemodynamically stable.  Oxygen saturation was 92-94% on room air.. WBC 19.6, hemoglobin 15.9, platelet 455.  Sodium 137, potassium 3.5, bicarbonate 21, serum creatinine 0.94. CTA chest was negative for PE but showed diffuse clustered nodular density in the bilateral lower lobes.  There is nonobstructive bilateral renal calculi.  CT abdomen and pelvis was negative for any acute findings.  Notably, the patient had a recent hospitalization from 02/11/2024 to 02/12/2024 when he was treated for acute respiratory failure secondary to influenza.  He was weaned off of supplemental oxygen and discharged on room air.  He was instructed to follow-up with pulmonologist regarding emphysema noted on his CT chest.  He was discharged to finish a course of steroids, oseltamavir, and antitussive  medications.  Assessment and Plan: Lobar pneumonia -02/19/2024 CTA chest--negative PE, diffuse cluster of nodular densities bilateral lower lobe - Continue ceftriaxone  and doxycycline  - COVID/RSV/influenza neg - Started Hycodan during hospitalization -There was a consideration for possible undiagnosed COPD during recent hospitalization and was to follow-up with outpatient pulmonology for PFTs. He quit smoking 20 years ago after 35 pack years - d/c home with cefdinir  and doxy x 4 more days - he has  pulmonary appt on 03/05/24 - ambulated 200 feet without oxygen desaturation <94%   Sepsis - Present on admission - Presented with fever, tachycardia, tachypnea, leukocytosis - Secondary to pneumonia - UA 0-5 WBC - lactic acid 1.4 - Follow blood cultures--neg to date - sepsis physiology resolved   GERD - Continue pantoprazole    Hypokalemia -replete -check mag 2.2         Consultants: none Procedures performed: none  Disposition: Home Diet recommendation:  Regular diet DISCHARGE MEDICATION: Allergies as of 02/22/2024       Reactions   Codeine Nausea And Vomiting   Simvastatin  Other (See Comments)   simvastatin    Colchicine Diarrhea   Hydrocodone  Nausea Only   Pholcodine Other (See Comments)        Medication List     STOP taking these medications    oseltamivir  30 MG capsule Commonly known as: TAMIFLU        TAKE these medications    albuterol  108 (90 Base) MCG/ACT inhaler Commonly known as: VENTOLIN  HFA Inhale 2 puffs into the lungs every 6 (six) hours as needed for wheezing or shortness of breath.   Breztri  Aerosphere 160-9-4.8 MCG/ACT Aero inhaler Generic drug: budesonide -glycopyrrolate -formoterol  Inhale 2 puffs into the lungs in the morning and at bedtime.   cefdinir  300 MG capsule Commonly known as: OMNICEF  Take 1 capsule (300 mg total) by mouth 2 (two) times daily.   doxycycline  100 MG tablet Commonly known as: VIBRA -TABS Take 1 tablet (100 mg total) by mouth every 12 (twelve) hours.   pantoprazole  40 MG tablet Commonly known as: PROTONIX  Take 1 tablet (40 mg total) by mouth daily before breakfast.  Discharge Exam: Filed Weights   02/19/24 1045  Weight: 82.1 kg   HEENT:  [place]/AT, No thrush, no icterus CV:  RRR, no rub, no S3, no S4 Lung:  bibasilar rales.  No wheeze Abd:  soft/+BS, NT Ext:  No edema, no lymphangitis, no synovitis, no rash   Condition at discharge: stable  The results of significant diagnostics from this  hospitalization (including imaging, microbiology, ancillary and laboratory) are listed below for reference.   Imaging Studies: CT Angio Chest PE W and/or Wo Contrast Result Date: 02/19/2024 CLINICAL DATA:  Fever.  Concern for pulmonary embolism.  Flank pain. EXAM: CT ANGIOGRAPHY CHEST CT ABDOMEN AND PELVIS WITH CONTRAST TECHNIQUE: Multidetector CT imaging of the chest was performed using the standard protocol during bolus administration of intravenous contrast. Multiplanar CT image reconstructions and MIPs were obtained to evaluate the vascular anatomy. Multidetector CT imaging of the abdomen and pelvis was performed using the standard protocol during bolus administration of intravenous contrast. RADIATION DOSE REDUCTION: This exam was performed according to the departmental dose-optimization program which includes automated exposure control, adjustment of the mA and/or kV according to patient size and/or use of iterative reconstruction technique. CONTRAST:  OMNIPAQUE  IOHEXOL  350 MG/ML SOLN COMPARISON:  Chest CT dated 02/11/2024 and CT abdomen pelvis dated 09/17/2019. FINDINGS: CTA CHEST FINDINGS Cardiovascular: There is no cardiomegaly or pericardial effusion. There is coronary vascular calcification of the LAD. Mild atherosclerotic calcification of the aortic arch. No aneurysmal dilatation or dissection. The origins of the great vessels of the aortic arch appear patent. Evaluation of the pulmonary arteries is limited due to respiratory motion and suboptimal visualization of the peripheral branches. No pulmonary artery embolus identified. Mediastinum/Nodes: Mildly enlarged left hilar and mediastinal lymph nodes measure up to 12 mm short axis in the subcarinal and paratracheal region. The esophagus is grossly normal. No mediastinal fluid collection. Lungs/Pleura: Diffuse clusters of nodular density throughout the lower lobes bilaterally consistent with atypical pneumonia or aspiration. Clinical correlation  is recommended. Scattered bilateral subpleural reticular densities likely sequela of an inflammatory/infectious etiology. No consolidative changes. There is no pleural effusion or pneumothorax. There is diffuse thickening of the lower lobe bronchi. The central airways are patent. Musculoskeletal: No acute osseous pathology. Review of the MIP images confirms the above findings. CT ABDOMEN and PELVIS FINDINGS No intra-abdominal free air or free fluid. Hepatobiliary: The liver is unremarkable. No biliary ductal dilatation. The gallbladder is unremarkable. Pancreas: Unremarkable. No pancreatic ductal dilatation or surrounding inflammatory changes. Spleen: Normal in size without focal abnormality. Adrenals/Urinary Tract: The adrenal glands unremarkable. Nonobstructing right renal calculi measure up to 5 mm. A tiny nonobstructing left renal upper pole calculus is also suspected. There is no hydronephrosis on either side. There is symmetric enhancement and excretion of contrast by both kidneys. The visualized ureters appear unremarkable. The urinary bladder is poorly visualized due to streak artifact caused by bilateral hip arthroplasties. Stomach/Bowel: There is no bowel obstruction or active inflammation. The appendix is normal. Vascular/Lymphatic: Moderate aortoiliac atherosclerotic disease. The IVC is unremarkable. No portal venous gas. There is no adenopathy. Reproductive: The prostate gland is not visualized. Other: None Musculoskeletal: There is osteopenia with degenerative changes of the spine. Bilateral total hip arthroplasties. No acute osseous pathology. Review of the MIP images confirms the above findings. IMPRESSION: 1. No CT evidence of pulmonary artery embolus. 2. Diffuse clusters of nodular density throughout the lower lobes bilaterally consistent with atypical pneumonia or aspiration. 3. No acute intra-abdominal or pelvic pathology. 4. Nonobstructing bilateral renal calculi.  No hydronephrosis. 5. No bowel  obstruction. Normal appendix. 6.  Aortic Atherosclerosis (ICD10-I70.0). Electronically Signed   By: Vanetta Chou M.D.   On: 02/19/2024 15:23   CT ABDOMEN PELVIS W CONTRAST Result Date: 02/19/2024 CLINICAL DATA:  Fever.  Concern for pulmonary embolism.  Flank pain. EXAM: CT ANGIOGRAPHY CHEST CT ABDOMEN AND PELVIS WITH CONTRAST TECHNIQUE: Multidetector CT imaging of the chest was performed using the standard protocol during bolus administration of intravenous contrast. Multiplanar CT image reconstructions and MIPs were obtained to evaluate the vascular anatomy. Multidetector CT imaging of the abdomen and pelvis was performed using the standard protocol during bolus administration of intravenous contrast. RADIATION DOSE REDUCTION: This exam was performed according to the departmental dose-optimization program which includes automated exposure control, adjustment of the mA and/or kV according to patient size and/or use of iterative reconstruction technique. CONTRAST:  OMNIPAQUE  IOHEXOL  350 MG/ML SOLN COMPARISON:  Chest CT dated 02/11/2024 and CT abdomen pelvis dated 09/17/2019. FINDINGS: CTA CHEST FINDINGS Cardiovascular: There is no cardiomegaly or pericardial effusion. There is coronary vascular calcification of the LAD. Mild atherosclerotic calcification of the aortic arch. No aneurysmal dilatation or dissection. The origins of the great vessels of the aortic arch appear patent. Evaluation of the pulmonary arteries is limited due to respiratory motion and suboptimal visualization of the peripheral branches. No pulmonary artery embolus identified. Mediastinum/Nodes: Mildly enlarged left hilar and mediastinal lymph nodes measure up to 12 mm short axis in the subcarinal and paratracheal region. The esophagus is grossly normal. No mediastinal fluid collection. Lungs/Pleura: Diffuse clusters of nodular density throughout the lower lobes bilaterally consistent with atypical pneumonia or aspiration. Clinical  correlation is recommended. Scattered bilateral subpleural reticular densities likely sequela of an inflammatory/infectious etiology. No consolidative changes. There is no pleural effusion or pneumothorax. There is diffuse thickening of the lower lobe bronchi. The central airways are patent. Musculoskeletal: No acute osseous pathology. Review of the MIP images confirms the above findings. CT ABDOMEN and PELVIS FINDINGS No intra-abdominal free air or free fluid. Hepatobiliary: The liver is unremarkable. No biliary ductal dilatation. The gallbladder is unremarkable. Pancreas: Unremarkable. No pancreatic ductal dilatation or surrounding inflammatory changes. Spleen: Normal in size without focal abnormality. Adrenals/Urinary Tract: The adrenal glands unremarkable. Nonobstructing right renal calculi measure up to 5 mm. A tiny nonobstructing left renal upper pole calculus is also suspected. There is no hydronephrosis on either side. There is symmetric enhancement and excretion of contrast by both kidneys. The visualized ureters appear unremarkable. The urinary bladder is poorly visualized due to streak artifact caused by bilateral hip arthroplasties. Stomach/Bowel: There is no bowel obstruction or active inflammation. The appendix is normal. Vascular/Lymphatic: Moderate aortoiliac atherosclerotic disease. The IVC is unremarkable. No portal venous gas. There is no adenopathy. Reproductive: The prostate gland is not visualized. Other: None Musculoskeletal: There is osteopenia with degenerative changes of the spine. Bilateral total hip arthroplasties. No acute osseous pathology. Review of the MIP images confirms the above findings. IMPRESSION: 1. No CT evidence of pulmonary artery embolus. 2. Diffuse clusters of nodular density throughout the lower lobes bilaterally consistent with atypical pneumonia or aspiration. 3. No acute intra-abdominal or pelvic pathology. 4. Nonobstructing bilateral renal calculi. No hydronephrosis.  5. No bowel obstruction. Normal appendix. 6.  Aortic Atherosclerosis (ICD10-I70.0). Electronically Signed   By: Vanetta Chou M.D.   On: 02/19/2024 15:23   DG Chest Port 1 View Result Date: 02/19/2024 CLINICAL DATA:  Shortness of breath, productive cough EXAM: PORTABLE CHEST 1 VIEW COMPARISON:  February 11, 2024 FINDINGS: The heart size and mediastinal contours are within normal limits. No acute pulmonary abnormality is noted. The visualized skeletal structures are unremarkable. IMPRESSION: No active disease. Electronically Signed   By: Lynwood Landy Raddle M.D.   On: 02/19/2024 11:29   CT Angio Chest PE W and/or Wo Contrast Result Date: 02/11/2024 CLINICAL DATA:  Wheezing chest discomfort cough congestion EXAM: CT ANGIOGRAPHY CHEST WITH CONTRAST TECHNIQUE: Multidetector CT imaging of the chest was performed using the standard protocol during bolus administration of intravenous contrast. Multiplanar CT image reconstructions and MIPs were obtained to evaluate the vascular anatomy. RADIATION DOSE REDUCTION: This exam was performed according to the departmental dose-optimization program which includes automated exposure control, adjustment of the mA and/or kV according to patient size and/or use of iterative reconstruction technique. CONTRAST:  75mL OMNIPAQUE  IOHEXOL  350 MG/ML SOLN COMPARISON:  Chest x-ray 02/11/2024, chest CT 05/10/2017 FINDINGS: Cardiovascular: Satisfactory opacification of the pulmonary arteries to the segmental level. No evidence of pulmonary embolism. Mild atherosclerosis. No aneurysm or dissection. Coronary vascular calcification. Upper normal cardiac size. No pericardial effusion Mediastinum/Nodes: Patent trachea. No suspicious thyroid  mass. Mildly prominent lymph nodes, for example left paratracheal node measuring 13 mm, previously 14 mm. Subcarinal lymph node measuring 11 mm, previously 12 mm. Small hilar nodes. Esophagus within normal limits aside from small hiatal hernia Lungs/Pleura:  Mild emphysema. No consolidation, pleural effusion or pneumothorax. Bilateral bronchial wall thickening. Minimal mucous plugging in the lower lobes. Ground-glass nodule at the left apex measuring 6 mm, series 11, image 26. Mild subpleural reticulation. Upper Abdomen: No acute finding Musculoskeletal: No acute osseous abnormality Review of the MIP images confirms the above findings. IMPRESSION: 1. Negative for acute pulmonary embolus. 2. Emphysema with bilateral bronchial wall thickening and minimal mucous plugging in the lower lobes, suspect for airways inflammatory process. 3. 6 mm ground-glass nodule at the left apex. Initial follow-up with CT at 6 months is recommended to confirm persistence. If persistent, repeat CT is recommended every 2 years until 5 years of stability has been established. This recommendation follows the consensus statement: Guidelines for Management of Incidental Pulmonary Nodules Detected on CT Images: From the Fleischner Society 2017; Radiology 2017; 284:228-243. 4. Aortic atherosclerosis. Aortic Atherosclerosis (ICD10-I70.0) and Emphysema (ICD10-J43.9). Electronically Signed   By: Luke Bun M.D.   On: 02/11/2024 16:44   DG Chest 2 View Result Date: 02/11/2024 EXAM: 2 VIEW(S) XRAY OF THE CHEST 02/11/2024 02:44:59 PM COMPARISON: None available. CLINICAL HISTORY: cough FINDINGS: LUNGS AND PLEURA: Chronic peripheral interstitial thickening. No focal pulmonary opacity. No pleural effusion. No pneumothorax. HEART AND MEDIASTINUM: No acute abnormality of the cardiac and mediastinal silhouettes. BONES AND SOFT TISSUES: No acute fracture or destructive lesion. Multilevel thoracic osteophytosis. IMPRESSION: 1. No acute findings. 2. Chronic peripheral interstitial thickening. Electronically signed by: Norleen Boxer MD MD 02/11/2024 03:46 PM EST RP Workstation: [id number]    Microbiology: Results for orders placed or performed during the hospital encounter of 02/19/24  Culture, blood  (single)     Status: None (Preliminary result)   Collection Time: 02/19/24 11:01 AM   Specimen: BLOOD  Result Value Ref Range Status   Specimen Description BLOOD RIGHT ANTECUBITAL  Final   Special Requests   Final    BOTTLES DRAWN AEROBIC AND ANAEROBIC Blood Culture adequate volume   Culture   Final    NO GROWTH 3 DAYS Performed at [facility], [address], [place], [place] [address]    Report Status PENDING  Incomplete  Culture, blood (routine x 2)  Status: None (Preliminary result)   Collection Time: 02/19/24 11:04 AM   Specimen: BLOOD  Result Value Ref Range Status   Specimen Description BLOOD RIGHT ARM  Final   Special Requests   Final    BOTTLES DRAWN AEROBIC AND ANAEROBIC Blood Culture results may not be optimal due to an inadequate volume of blood received in culture bottles   Culture   Final    NO GROWTH 3 DAYS Performed at [facility], [address], [place], [place] [address]    Report Status PENDING  Incomplete  Resp panel by RT-PCR (RSV, Flu A&B, Covid) Anterior Nasal Swab     Status: None   Collection Time: 02/19/24  2:05 PM   Specimen: Anterior Nasal Swab  Result Value Ref Range Status   SARS Coronavirus 2 by RT PCR NEGATIVE NEGATIVE Final    Comment: (NOTE) SARS-CoV-2 target nucleic acids are NOT DETECTED.  The SARS-CoV-2 RNA is generally detectable in upper respiratory specimens during the acute phase of infection. The lowest concentration of SARS-CoV-2 viral copies this assay can detect is 138 copies/mL. A negative result does not preclude SARS-Cov-2 infection and should not be used as the sole basis for treatment or other patient management decisions. A negative result may occur with  improper specimen collection/handling, submission of specimen other than nasopharyngeal swab, presence of viral mutation(s) within the areas targeted by this assay, and inadequate number of viral copies(<138 copies/mL). A negative result must be combined with clinical  observations, patient history, and epidemiological information. The expected result is Negative.  Fact Sheet for Patients:  bloggercourse.com  Fact Sheet for Healthcare Providers:  seriousbroker.it  This test is no t yet approved or cleared by the [place]  FDA and  has been authorized for detection and/or diagnosis of SARS-CoV-2 by FDA under an Emergency Use Authorization (EUA). This EUA will remain  in effect (meaning this test can be used) for the duration of the COVID-19 declaration under Section 564(b)(1) of the Act, 21 U.S.C.section 360bbb-3(b)(1), unless the authorization is terminated  or revoked sooner.       Influenza A by PCR NEGATIVE NEGATIVE Final   Influenza B by PCR NEGATIVE NEGATIVE Final    Comment: (NOTE) The Xpert Xpress SARS-CoV-2/FLU/RSV plus assay is intended as an aid in the diagnosis of influenza from Nasopharyngeal swab specimens and should not be used as a sole basis for treatment. Nasal washings and aspirates are unacceptable for Xpert Xpress SARS-CoV-2/FLU/RSV testing.  Fact Sheet for Patients: bloggercourse.com  Fact Sheet for Healthcare Providers: seriousbroker.it  This test is not yet approved or cleared by the [place]  FDA and has been authorized for detection and/or diagnosis of SARS-CoV-2 by FDA under an Emergency Use Authorization (EUA). This EUA will remain in effect (meaning this test can be used) for the duration of the COVID-19 declaration under Section 564(b)(1) of the Act, 21 U.S.C. section 360bbb-3(b)(1), unless the authorization is terminated or revoked.     Resp Syncytial Virus by PCR NEGATIVE NEGATIVE Final    Comment: (NOTE) Fact Sheet for Patients: bloggercourse.com  Fact Sheet for Healthcare Providers: seriousbroker.it  This test is not yet approved or cleared by  the [place]  FDA and has been authorized for detection and/or diagnosis of SARS-CoV-2 by FDA under an Emergency Use Authorization (EUA). This EUA will remain in effect (meaning this test can be used) for the duration of the COVID-19 declaration under Section 564(b)(1) of the Act, 21 U.S.C. section 360bbb-3(b)(1), unless the authorization is terminated or  revoked.  Performed at [facility], [address] [place], [place] [address]   C Difficile Quick Screen w PCR reflex     Status: None   Collection Time: 02/21/24  3:40 AM   Specimen: STOOL  Result Value Ref Range Status   C Diff antigen NEGATIVE NEGATIVE Final   C Diff toxin NEGATIVE NEGATIVE Final   C Diff interpretation No C. difficile detected.  Final    Comment: Performed at [facility], [address] [place], [place] [address]    Labs: CBC: Recent Labs  Lab 02/19/24 1101 02/20/24 0457 02/21/24 0425 02/22/24 0445  WBC 19.6* 14.6* 13.1* 11.9*  NEUTROABS 16.7*  --   --   --   HGB 15.9 12.6* 11.9* 12.0*  HCT 46.1 36.9* 35.3* 35.7*  MCV 95.1 96.1 97.0 96.5  PLT 455* 325 334 336   Basic Metabolic Panel: Recent Labs  Lab 02/19/24 1101 02/20/24 0457 02/21/24 0425 02/22/24 0445  NA 136 137 136 136  K 3.6 3.5 3.0* 3.3*  CL 96* 103 100 101  CO2 27 21* 26 26  GLUCOSE 120* 100* 114* 119*  BUN 18 14 11 10   CREATININE 1.26* 0.94 1.06 0.84  CALCIUM  9.5 8.6* 8.5* 8.5*  MG  --   --  2.2 2.1   Liver Function Tests: Recent Labs  Lab 02/19/24 1101  AST 42*  ALT 66*  ALKPHOS 163*  BILITOT 1.4*  PROT 7.7  ALBUMIN 3.9   CBG: No results for input(s): GLUCAP in the last 168 hours.  Discharge time spent: greater than 30 minutes.  Signed: Alm Schneider, MD [facility] 02/22/2024 "

## 2024-02-22 NOTE — Plan of Care (Signed)
" °  Problem: Clinical Measurements: Goal: Will remain free from infection Outcome: Progressing Goal: Respiratory complications will improve Outcome: Progressing   Problem: Activity: Goal: Risk for activity intolerance will decrease Outcome: Adequate for Discharge   Problem: Pain Managment: Goal: General experience of comfort will improve and/or be controlled Outcome: Adequate for Discharge   Problem: Safety: Goal: Ability to remain free from injury will improve Outcome: Adequate for Discharge   Problem: Skin Integrity: Goal: Risk for impaired skin integrity will decrease Outcome: Adequate for Discharge   "

## 2024-02-24 LAB — CULTURE, BLOOD (ROUTINE X 2): Culture: NO GROWTH

## 2024-02-24 LAB — CULTURE, BLOOD (SINGLE)
Culture: NO GROWTH
Special Requests: ADEQUATE

## 2024-02-27 ENCOUNTER — Ambulatory Visit: Admitting: Internal Medicine

## 2024-02-27 ENCOUNTER — Encounter: Payer: Self-pay | Admitting: Internal Medicine

## 2024-02-27 ENCOUNTER — Ambulatory Visit (HOSPITAL_COMMUNITY)
Admission: RE | Admit: 2024-02-27 | Discharge: 2024-02-27 | Disposition: A | Discharge status: Home / Self Care | Source: Ambulatory Visit | Attending: Internal Medicine | Admitting: Internal Medicine

## 2024-02-27 VITALS — BP 108/75 | HR 104 | Ht 68.0 in | Wt 183.0 lb

## 2024-02-27 DIAGNOSIS — J4551 Severe persistent asthma with (acute) exacerbation: Secondary | ICD-10-CM

## 2024-02-27 DIAGNOSIS — Z87891 Personal history of nicotine dependence: Secondary | ICD-10-CM | POA: Diagnosis not present

## 2024-02-27 MED ORDER — BUDESONIDE-FORMOTEROL FUMARATE 80-4.5 MCG/ACT IN AERO: INHALATION_SPRAY | RESPIRATORY_TRACT | 12 refills | Status: AC

## 2024-02-27 MED ORDER — METHYLPREDNISOLONE ACETATE 80 MG/ML IJ SUSP
120.0000 mg | Freq: Once | INTRAMUSCULAR | Status: AC
  Administered 2024-02-27: 120 mg via INTRAMUSCULAR

## 2024-02-27 MED ORDER — FAMOTIDINE 20 MG PO TABS: ORAL_TABLET | ORAL | 11 refills | Status: AC

## 2024-02-27 MED ORDER — OXYCODONE HCL 5 MG PO CAPS: 5.0000 mg | ORAL_CAPSULE | ORAL | 0 refills | Status: AC | PRN

## 2024-02-27 NOTE — Patient Instructions (Addendum)
 Pantoprazole  (protonix ) 40 mg   Take  30-60 min before first meal of the day and Pepcid  (famotidine )  20 mg after supper until return to office - this is the best way to tell whether stomach acid is contributing to your problem.    Plan A = Automatic = Always=    stop breztri  and start Symbicort  80 Take 2 puffs first thing in am and then another 2 puffs about 12 hours later.   Work on inhaler technique:  relax and gently blow all the way out then take a nice smooth full deep breath back in, triggering the inhaler at same time you start breathing in.  Hold breath in for at least  5 seconds if you can. Blow out symbicort   thru nose. Rinse and gargle with water  when done.  If mouth or throat bother you at all,  try brushing teeth/gums/tongue with arm and hammer toothpaste/ make a slurry and gargle and spit out.      Plan B = Backup (to supplement plan A, not to replace it) Use your albuterol  inhaler as a rescue medication to be used if you can't catch your breath by resting or slowing your pace  or doing a relaxed purse lip breathing pattern.  - The less you use it, the better it will work when you need it. - Ok to use the inhaler up to 2 puffs  every 4 hours if you must but call for appointment if use goes up over your usual need - Don't leave home without it !!  (think of it like the spare tire or starter fluid for your car)   Also  Ok to try albuterol  15 min before an activity (on alternating days)  that you know would usually make you short of breath and see if it makes any difference and if makes none then don't take albuterol  after activity unless you can't catch your breath as this means it's the resting that helps, not the albuterol .   For  cough / congestion >  mucinex  dm 1200 mg every 12 hours and oxyir 5 mg 1-2 every 4 hours  and jolly ranchers (no mint or menthol )    Please remember to go to the  x-ray department  @  [facility] for your tests - we will call you with the results  when they are available      Please schedule a follow up office visit in 4 weeks, call sooner if needed

## 2024-02-27 NOTE — Progress Notes (Signed)
 "   Cory Roberts, male    DOB: 1960-09-28    MRN: [id number]   Brief patient profile:  18  yowm  quit smoking 2007 p cva  referred to pulmonary clinic in [place]  02/27/2024 by  [facility]  for post hosp f/u   [facility] eval p [facility] reported abn PFTs and he repeated and was says was told repeat study = normal and no f/u needed    Pt not previously seen by PCCM service.    Admit date:     02/19/2024  Discharge date: 02/22/24          Hospital Course: 64 year old male with a history of CVA, GERD, migraine, PUD, Raynaud's phenomenon presenting with shortness of breath, coughing, chest congestion, and brown sputum for the past 4 days. In the ED, the patient had temperature of 100.9 F with tachycardia up to 110.  He was hemodynamically stable.  Oxygen saturation was 92-94% on room air.. WBC 19.6, hemoglobin 15.9, platelet 455.  Sodium 137, potassium 3.5, bicarbonate 21, serum creatinine 0.94. CTA chest was negative for PE but showed diffuse clustered nodular density in the bilateral lower lobes.  There is nonobstructive bilateral renal calculi.  CT abdomen and pelvis was negative for any acute findings.   Notably, the patient had a recent hospitalization from 02/11/2024 to 02/12/2024 when he was treated for acute respiratory failure secondary to influenza.  He was weaned off of supplemental oxygen and discharged on room air.  He was instructed to follow-up with pulmonologist regarding emphysema noted on his CT chest.  He was discharged to finish a course of steroids, oseltamavir, and antitussive  medications.   Assessment and Plan: Lobar pneumonia -02/19/2024 CTA chest--negative PE, diffuse cluster of nodular densities bilateral lower lobe - Continue ceftriaxone  and doxycycline  - COVID/RSV/influenza neg - Started Hycodan during hospitalization -There was a consideration for possible undiagnosed COPD during recent hospitalization and was to follow-up with outpatient pulmonology for PFTs. He quit  smoking 20 years ago after 35 pack years - d/c home with cefdinir  and doxy x 4 more days - he has pulmonary appt on 03/05/24 - ambulated 200 feet without oxygen desaturation <94%   Sepsis - Present on admission - Presented with fever, tachycardia, tachypnea, leukocytosis - Secondary to pneumonia - UA 0-5 WBC - lactic acid 1.4 - Follow blood cultures--neg to date - sepsis physiology resolved   GERD - Continue pantoprazole    Hypokalemia -replete -check mag 2.2   History of Present Illness  02/27/2024  Pulmonary/ 1st office eval/ [name] / [facility]  Chief Complaint  Patient presents with   Establish Care    In [facility] for pna / dx in [facility] for copd  Shob / coughing   Dyspnea:  baseline able climb 2 flights at a time / now across the room  Cough: severe esp p supper    Sleep: in recliner x 45 degrees p 2 hours settles down until wakes up hacking in am but no production SABA use: not helping  02: none  Overt HB  prior to illness/ worse now with cough with gen chest discomfort during cough     No obvious day to day or daytime pattern/variability or assoc excess/ purulent sputum or mucus plugs or hemoptysis or   chest tightness, subjective wheeze or overt sinus symptoms   Also denies any obvious fluctuation of symptoms with weather or environmental changes or other aggravating or alleviating factors except as outlined above   No unusual exposure hx or h/o  childhood pna/ asthma or knowledge of premature birth.  Current Allergies, Complete Past Medical History, Past Surgical History, Family History, and Social History were reviewed in [employer] record.  ROS  The following are not active complaints unless bolded Hoarseness, sore throat, dysphagia, dental problems, itching, sneezing,  nasal congestion or discharge of excess mucus or purulent secretions, ear ache,   fever, chills, sweats, unintended wt loss or wt gain, classically pleuritic or exertional cp,   orthopnea pnd or arm/hand swelling  or leg swelling, presyncope, palpitations, abdominal pain, anorexia, nausea, vomiting, diarrhea  or change in bowel habits or change in bladder habits, change in stools or change in urine, dysuria, hematuria,  rash, arthralgias, visual complaints, headache, numbness, weakness or ataxia or problems with walking or coordination,  change in mood or  memory.            Outpatient Medications Prior to Visit  Medication Sig Dispense Refill   albuterol  (VENTOLIN  HFA) 108 (90 Base) MCG/ACT inhaler Inhale 2 puffs into the lungs every 6 (six) hours as needed for wheezing or shortness of breath. 8 g 2   budesonide -glycopyrrolate -formoterol  (BREZTRI  AEROSPHERE) 160-9-4.8 MCG/ACT AERO inhaler Inhale 2 puffs into the lungs in the morning and at bedtime. 10.7 g 1   pantoprazole  (PROTONIX ) 40 MG tablet Take 1 tablet (40 mg total) by mouth daily before breakfast. 30 tablet 11   cefdinir  (OMNICEF ) 300 MG capsule Take 1 capsule (300 mg total) by mouth 2 (two) times daily. (Patient not taking: Reported on 02/27/2024) 8 capsule 0   doxycycline  (VIBRA -TABS) 100 MG tablet Take 1 tablet (100 mg total) by mouth every 12 (twelve) hours. (Patient not taking: Reported on 02/27/2024) 8 tablet 0   No facility-administered medications prior to visit.    Past Medical History:  Diagnosis Date   Arthritis 03/2011   Gout- Right foot   COPD (chronic obstructive pulmonary disease) (HCC)    CVA (cerebral infarction)    GERD (gastroesophageal reflux disease)    Gout    Headache    Migraine Headaches   History of kidney stones    PUD (peptic ulcer disease) 2016   Raynaud's syndrome    Stroke (HCC) 2011      Objective:     BP 108/75   Pulse (!) 104   Ht 5' 8 (1.727 m)   Wt 183 lb (83 kg)   SpO2 93% Comment: ra  BMI 27.83 kg/m   SpO2: 93 % (ra) pleasant amb wm with harsh dry coughing fits   HEENT : Oropharynx  clear   Nasal turbinates nl    NECK :  without  apparent JVD/  palpable Nodes/TM    LUNGS: no acc muscle use,  Min barrel  contour chest wall with bilateral  slightly decreased bs min exp wheeze and  without cough on insp or exp maneuvers and min  Hyperresonant  to  percussion bilaterally    CV:  RRR  no s3 or murmur or increase in P2, and no edema   ABD:  soft and nontender    MS:  Nl gait/ ext warm without deformities Or obvious joint restrictions  calf tenderness, cyanosis or clubbing     SKIN: warm and dry without lesions    NEURO:  alert, approp, nl sensorium with  no motor or cerebellar deficits apparent.         CXR PA and Lateral:   02/27/2024 :    I personally reviewed images and impression is as  follows:   No def as dz/ nodular/cystic changes RLL post basal sgment        Assessment   Assessment & Plan Asthmatic bronchitis with exacerbation, severe persistent (HCC) Quit smoking 2007 s sequalae  - flared Jan 2026 in setting of flu with secondary bacterial infection  - 02/27/2024  After extensive coaching inhaler device,  effectiveness =    75% from a baseline of 25% > change to Symbicort  80 as cough is the main complaint and high doses of ICS (which may irritate the upper airway) are probably not needed for the lower airway or the parenchyma in the setting of  CAP recovery.  I doubt he has all that much copd by hx or exam but cough is out of control at this point and needs to be aggressively treated.  Of the three most common causes of  Sub-acute / recurrent or chronic cough, only one (GERD)  can actually contribute to/ trigger  the other two (asthma and post nasal drip syndrome)  and perpetuate the cylce of cough.  While not intuitively obvious, many patients with chronic low grade reflux do not cough until there is a primary insult that disturbs the protective epithelial barrier and exposes sensitive nerve endings.   This is typically viral but can due to PNDS and  either may apply here.   >>>    The point is that once this occurs,  it is difficult to eliminate the cycle  using anything but a maximally effective acid suppression regimen at least in the short run, accompanied by an appropriate diet to address non acid GERD and control / eliminate the cough itself for at least 3 days with  oxy-IR plus depomderol 120 mg IM   in case of component of Th-2 driven upper or lower airways inflammation (if cough responds short term only to relapse before return while will on full rx for uacs (as above), then that would point to allergic rhinitis/ asthma or eos bronchitis as alternative dx)     >>> f/u 4 weeks to regroup and address long term f/u needs which will include pfts and perhaps HRCT depending on response to conservative rx for now          Each maintenance medication was reviewed in detail including emphasizing most importantly the difference between maintenance and prns and under what circumstances the prns are to be triggered using an action plan format where appropriate.  Total time for H and P, chart review, counseling, reviewing hfa  device(s) and generating customized AVS unique to this office visit / same day charting = 65 min   For patient with  new  refractory respiratory  symptoms of uncertain etiology.         AVS  Patient Instructions  Pantoprazole  (protonix ) 40 mg   Take  30-60 min before first meal of the day and Pepcid  (famotidine )  20 mg after supper until return to office - this is the best way to tell whether stomach acid is contributing to your problem.    Plan A = Automatic = Always=    stop breztri  and start Symbicort  80 Take 2 puffs first thing in am and then another 2 puffs about 12 hours later.   Work on inhaler technique:  relax and gently blow all the way out then take a nice smooth full deep breath back in, triggering the inhaler at same time you start breathing in.  Hold breath in for at least  5 seconds if  you can. Blow out symbicort   thru nose. Rinse and gargle with water  when done.  If mouth or  throat bother you at all,  try brushing teeth/gums/tongue with arm and hammer toothpaste/ make a slurry and gargle and spit out.      Plan B = Backup (to supplement plan A, not to replace it) Use your albuterol  inhaler as a rescue medication to be used if you can't catch your breath by resting or slowing your pace  or doing a relaxed purse lip breathing pattern.  - The less you use it, the better it will work when you need it. - Ok to use the inhaler up to 2 puffs  every 4 hours if you must but call for appointment if use goes up over your usual need - Don't leave home without it !!  (think of it like the spare tire or starter fluid for your car)   Also  Ok to try albuterol  15 min before an activity (on alternating days)  that you know would usually make you short of breath and see if it makes any difference and if makes none then don't take albuterol  after activity unless you can't catch your breath as this means it's the resting that helps, not the albuterol .   For  cough / congestion >  mucinex  dm 1200 mg every 12 hours and oxyir 5 mg 1-2 every 4 hours  and jolly ranchers (no mint or menthol )    Please remember to go to the  x-ray department  @  [facility] for your tests - we will call you with the results when they are available      Please schedule a follow up office visit in 4 weeks, call sooner if needed           Ozell America, MD 02/27/2024      "

## 2024-02-27 NOTE — Assessment & Plan Note (Addendum)
 Quit smoking 2007 s sequalae  - flared Jan 2026 in setting of flu with secondary bacterial infection  - 02/27/2024  After extensive coaching inhaler device,  effectiveness =    75% from a baseline of 25% > change to Symbicort  80 as cough is the main complaint and high doses of ICS (which may irritate the upper airway) are probably not needed for the lower airway or the parenchyma in the setting of  CAP recovery.  I doubt he has all that much copd by hx or exam but cough is out of control at this point and needs to be aggressively treated.  Of the three most common causes of  Sub-acute / recurrent or chronic cough, only one (GERD)  can actually contribute to/ trigger  the other two (asthma and post nasal drip syndrome)  and perpetuate the cylce of cough.  While not intuitively obvious, many patients with chronic low grade reflux do not cough until there is a primary insult that disturbs the protective epithelial barrier and exposes sensitive nerve endings.   This is typically viral but can due to PNDS and  either may apply here.   >>>    The point is that once this occurs, it is difficult to eliminate the cycle  using anything but a maximally effective acid suppression regimen at least in the short run, accompanied by an appropriate diet to address non acid GERD and control / eliminate the cough itself for at least 3 days with  oxy-IR plus depomderol 120 mg IM   in case of component of Th-2 driven upper or lower airways inflammation (if cough responds short term only to relapse before return while will on full rx for uacs (as above), then that would point to allergic rhinitis/ asthma or eos bronchitis as alternative dx)     >>> f/u 4 weeks to regroup and address long term f/u needs which will include pfts and perhaps HRCT depending on response to conservative rx for now          Each maintenance medication was reviewed in detail including emphasizing most importantly the difference between  maintenance and prns and under what circumstances the prns are to be triggered using an action plan format where appropriate.  Total time for H and P, chart review, counseling, reviewing hfa  device(s) and generating customized AVS unique to this office visit / same day charting = 65 min   For patient with  new  refractory respiratory  symptoms of uncertain etiology.

## 2024-03-02 ENCOUNTER — Ambulatory Visit: Payer: Self-pay | Admitting: Internal Medicine

## 2024-03-02 DIAGNOSIS — J4551 Severe persistent asthma with (acute) exacerbation: Secondary | ICD-10-CM

## 2024-03-03 ENCOUNTER — Telehealth: Payer: Self-pay | Admitting: Internal Medicine

## 2024-03-04 NOTE — Telephone Encounter (Signed)
 I called and spoke with and notified of results/recs per MW  He states overall he is feeling well, other than having a non prod cough  He denies any fever, SOB or other co's  He is unsure if needs more pred and abx  Pt is also asking when to return to work  He is currently scheduled with Dr Darlean on 03/28/24 and is aware cxr will be needed prior to ov- I have placed order  Dr Darlean, please advise, thanks!

## 2024-03-05 ENCOUNTER — Ambulatory Visit: Admitting: Internal Medicine

## 2024-03-05 NOTE — Progress Notes (Signed)
 I called and spoke with the pt. He states wants to wait until seen 03/28/24 to return to work. He is asking for letter to keep him out until 03/27/24. Is this ok?

## 2024-03-06 ENCOUNTER — Encounter: Payer: Self-pay | Admitting: *Deleted

## 2024-03-07 NOTE — Telephone Encounter (Signed)
 Cory Ozell NOVAK, MD to Me     03/06/24  8:45 AM Yes that's fine with me  Letter done and has already been picked up by pt Nothing further needed

## 2024-03-28 ENCOUNTER — Ambulatory Visit: Admitting: Internal Medicine

## 2024-04-30 ENCOUNTER — Ambulatory Visit
